# Patient Record
Sex: Female | Born: 1995 | Race: White | Hispanic: No | Marital: Single | Smoking: Never smoker
Health system: Southern US, Community
[De-identification: ages and names within clinical notes are randomized; demographics above are authoritative.]

## PROBLEM LIST (undated history)

## (undated) ENCOUNTER — Emergency Department (HOSPITAL_COMMUNITY): Admission: EM | Payer: Medicaid Other | Source: Home / Self Care

## (undated) DIAGNOSIS — F419 Anxiety disorder, unspecified: Secondary | ICD-10-CM

## (undated) DIAGNOSIS — B009 Herpesviral infection, unspecified: Secondary | ICD-10-CM

## (undated) DIAGNOSIS — C801 Malignant (primary) neoplasm, unspecified: Secondary | ICD-10-CM

## (undated) DIAGNOSIS — F909 Attention-deficit hyperactivity disorder, unspecified type: Secondary | ICD-10-CM

## (undated) DIAGNOSIS — J45909 Unspecified asthma, uncomplicated: Secondary | ICD-10-CM

## (undated) DIAGNOSIS — D649 Anemia, unspecified: Secondary | ICD-10-CM

## (undated) DIAGNOSIS — F32A Depression, unspecified: Secondary | ICD-10-CM

## (undated) DIAGNOSIS — K219 Gastro-esophageal reflux disease without esophagitis: Secondary | ICD-10-CM

## (undated) DIAGNOSIS — Z800 Family history of malignant neoplasm of digestive organs: Secondary | ICD-10-CM

## (undated) DIAGNOSIS — Z808 Family history of malignant neoplasm of other organs or systems: Secondary | ICD-10-CM

## (undated) DIAGNOSIS — F329 Major depressive disorder, single episode, unspecified: Secondary | ICD-10-CM

## (undated) HISTORY — DX: Unspecified asthma, uncomplicated: J45.909

## (undated) HISTORY — DX: Major depressive disorder, single episode, unspecified: F32.9

## (undated) HISTORY — DX: Anxiety disorder, unspecified: F41.9

## (undated) HISTORY — DX: Family history of malignant neoplasm of other organs or systems: Z80.8

## (undated) HISTORY — DX: Family history of malignant neoplasm of digestive organs: Z80.0

## (undated) HISTORY — DX: Depression, unspecified: F32.A

## (undated) HISTORY — PX: NO PAST SURGERIES: SHX2092

## (undated) MED FILL — Dexamethasone Sodium Phosphate Inj 100 MG/10ML: INTRAMUSCULAR | Qty: 1 | Status: AC

---
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
## 2014-01-08 ENCOUNTER — Encounter (HOSPITAL_BASED_OUTPATIENT_CLINIC_OR_DEPARTMENT_OTHER): Payer: Self-pay

## 2014-01-08 ENCOUNTER — Emergency Department (HOSPITAL_BASED_OUTPATIENT_CLINIC_OR_DEPARTMENT_OTHER)
Admission: EM | Admit: 2014-01-08 | Discharge: 2014-01-08 | Disposition: A | Discharge status: Home / Self Care | Payer: Self-pay | Attending: Emergency Medicine | Admitting: Emergency Medicine

## 2014-01-08 DIAGNOSIS — Z720 Tobacco use: Secondary | ICD-10-CM | POA: Insufficient documentation

## 2014-01-08 DIAGNOSIS — N390 Urinary tract infection, site not specified: Secondary | ICD-10-CM | POA: Insufficient documentation

## 2014-01-08 DIAGNOSIS — F329 Major depressive disorder, single episode, unspecified: Secondary | ICD-10-CM | POA: Insufficient documentation

## 2014-01-08 DIAGNOSIS — Z3202 Encounter for pregnancy test, result negative: Secondary | ICD-10-CM | POA: Insufficient documentation

## 2014-01-08 DIAGNOSIS — F419 Anxiety disorder, unspecified: Secondary | ICD-10-CM | POA: Insufficient documentation

## 2014-01-08 DIAGNOSIS — Z7952 Long term (current) use of systemic steroids: Secondary | ICD-10-CM | POA: Insufficient documentation

## 2014-01-08 DIAGNOSIS — J45909 Unspecified asthma, uncomplicated: Secondary | ICD-10-CM | POA: Insufficient documentation

## 2014-01-08 DIAGNOSIS — Z79899 Other long term (current) drug therapy: Secondary | ICD-10-CM | POA: Insufficient documentation

## 2014-01-08 LAB — URINALYSIS, ROUTINE W REFLEX MICROSCOPIC
Glucose, UA: NEGATIVE mg/dL
Ketones, ur: 15 mg/dL — AB
Nitrite: NEGATIVE
PROTEIN: 100 mg/dL — AB
Specific Gravity, Urine: 1.018 (ref 1.005–1.030)
UROBILINOGEN UA: 0.2 mg/dL (ref 0.0–1.0)
pH: 6.5 (ref 5.0–8.0)

## 2014-01-08 LAB — URINE MICROSCOPIC-ADD ON

## 2014-01-08 LAB — PREGNANCY, URINE: Preg Test, Ur: NEGATIVE

## 2014-01-08 MED ORDER — ONDANSETRON HCL 4 MG PO TABS: 4.0000 mg | ORAL_TABLET | Freq: Four times a day (QID) | ORAL | Status: DC

## 2014-01-08 MED ORDER — CEPHALEXIN 500 MG PO CAPS: ORAL_CAPSULE | ORAL | Status: DC

## 2014-01-08 NOTE — Discharge Instructions (Signed)

## 2014-01-08 NOTE — ED Notes (Addendum)
Pt reports lower back and lower abdominal pain for 2 days - states recently self medicated for UTI with 4 day course of OTC urinary pain relief medication. Also c/o nausea and vomiting x2 episodes. Pt reports taking a friends tramadol at 1500.

## 2014-01-08 NOTE — ED Provider Notes (Signed)
CSN: [id number]     Arrival date & time 01/08/14  1747 History   First MD Initiated Contact with Patient 01/08/14 1838     Chief Complaint  Patient presents with  . Abdominal Pain     (Consider location/radiation/quality/duration/timing/severity/associated sxs/prior Treatment) HPI   18 year old female with anxiety and depression who presents complaining of low abdominal pain. She states for the past 4 days she has been having dysuria, which include burning urination, urinary frequency and urgency. Symptoms felt similar to prior UTI that she had in the past. She was taking over-the-counter UTI medication with some relief. However for the past 2 days she has had achy pain throughout her low back and her left abdomen. Pain has gotten progressively worse. She reported taking one of her friends tramadol which helps. She did felt nauseous and vomited twice. Reports having some chills. She denies any headache, chest pain, short of breath, productive cough, vaginal bleeding, vaginal discharge, or rash. No prior history of kidney stone. She is sexually active with one partner, using protection she denies any prior history of STD.  Past Medical History  Diagnosis Date  . Anxiety   . Depression   . Asthma     Exercise Induced   History reviewed. No pertinent past surgical history. Family History  Problem Relation Age of Onset  . Depression Mother   . Hyperlipidemia Mother   . Cancer Mother     thyroid  . ADD / ADHD Brother   . Diabetes Maternal Grandfather   . Atrial fibrillation Father   . Irritable bowel syndrome Father     also brother  . Depression Brother    History  Substance Use Topics  . Smoking status: Light Tobacco Smoker  . Smokeless tobacco: Not on file  . Alcohol Use: Yes     Comment: rarely   OB History    No data available     Review of Systems  All other systems reviewed and are negative.     Allergies  Zoloft  Home Medications   Prior to Admission  medications   Medication Sig Start Date End Date Taking? Authorizing Provider  albuterol (PROVENTIL HFA;VENTOLIN HFA) 108 (90 BASE) MCG/ACT inhaler Inhale 2 puffs into the lungs every 6 (six) hours as needed.    Historical Provider, MD  escitalopram (LEXAPRO) 20 MG tablet Take 0.5 tablets (10 mg total) by mouth daily. 05/03/12   Wardell Honour, MD  medroxyPROGESTERone (DEPO-PROVERA) 150 MG/ML injection Inject 150 mg into the muscle every 3 (three) months.    Historical Provider, MD  Multiple Vitamin (MULTIVITAMINS PO) Take by mouth daily.    Historical Provider, MD  sertraline (ZOLOFT) 20 MG/ML concentrated solution Take 5 mLs (100 mg total) by mouth daily. 10/31/11   Wardell Honour, MD   BP 123/82 mmHg  Pulse 96  Temp(Src) 98.1 F (36.7 C) (Oral)  Resp 20  Ht 5\' 5"  (1.651 m)  Wt 117 lb (53.071 kg)  BMI 19.47 kg/m2  SpO2 97%  LMP 01/01/2014 Physical Exam  Constitutional: She is oriented to person, place, and time. She appears well-developed and well-nourished. No distress.  HENT:  Head: Atraumatic.  Eyes: Conjunctivae are normal.  Neck: Neck supple.  Cardiovascular: Intact distal pulses.   Abdominal: Soft. There is tenderness (suprapubic tenderness without guarding or rebound tenderness.).  Musculoskeletal: She exhibits no edema.  Paralumbar muscular tenderness to percussion. No significant midline spine tenderness.  Neurological: She is alert and oriented to person, place, and time.  Skin: No rash noted.  Psychiatric: She has a normal mood and affect.  Nursing note and vitals reviewed.   ED Course  Procedures (including critical care time)  7:07 PM Patient here with low back pain and abdominal pain. Her urine shows evidence of urinary tract infection. Given her low abdominal pain I did offer to do a pelvic exam to rule STD however patient declined. Treat her symptoms but patient aware to return if his symptoms persist or worsen. She is able to tolerate by mouth. Her vital signs  has improved. She appeared nontoxic.  Labs Review Labs Reviewed  URINALYSIS, ROUTINE W REFLEX MICROSCOPIC - Abnormal; Notable for the following:    Color, Urine AMBER (*)    APPearance CLOUDY (*)    Hgb urine dipstick LARGE (*)    Bilirubin Urine SMALL (*)    Ketones, ur 15 (*)    Protein, ur 100 (*)    Leukocytes, UA LARGE (*)    All other components within normal limits  URINE MICROSCOPIC-ADD ON - Abnormal; Notable for the following:    Bacteria, UA MANY (*)    All other components within normal limits  PREGNANCY, URINE    Imaging Review No results found.   EKG Interpretation None      MDM   Final diagnoses:  UTI (lower urinary tract infection)    BP 123/82 mmHg  Pulse 96  Temp(Src) 98.1 F (36.7 C) (Oral)  Resp 20  Ht 5\' 5"  (1.651 m)  Wt 117 lb (53.071 kg)  BMI 19.47 kg/m2  SpO2 97%  LMP 01/01/2014     Domenic Moras, PA-C 01/08/14 1911  Fredia Sorrow, MD 01/09/14 2007

## 2017-02-16 DIAGNOSIS — J4520 Mild intermittent asthma, uncomplicated: Secondary | ICD-10-CM | POA: Insufficient documentation

## 2017-02-16 DIAGNOSIS — F332 Major depressive disorder, recurrent severe without psychotic features: Secondary | ICD-10-CM | POA: Insufficient documentation

## 2017-02-16 DIAGNOSIS — F411 Generalized anxiety disorder: Secondary | ICD-10-CM | POA: Insufficient documentation

## 2017-02-16 DIAGNOSIS — F900 Attention-deficit hyperactivity disorder, predominantly inattentive type: Secondary | ICD-10-CM | POA: Insufficient documentation

## 2017-02-16 DIAGNOSIS — F1290 Cannabis use, unspecified, uncomplicated: Secondary | ICD-10-CM | POA: Insufficient documentation

## 2020-08-10 ENCOUNTER — Telehealth: Payer: Self-pay | Admitting: Adult Health

## 2020-08-10 NOTE — Telephone Encounter (Signed)
Received  a call from Ms. Laughery to have workup for breast cancer. She has been cld and scheduled to see Mendel Ryder on 6/27 at 12:30pm. I lft the appt date and time on the pt's vm.

## 2020-08-13 ENCOUNTER — Telehealth: Payer: Self-pay | Admitting: Adult Health

## 2020-08-13 ENCOUNTER — Ambulatory Visit: Payer: Self-pay | Admitting: *Deleted

## 2020-08-13 ENCOUNTER — Encounter: Payer: Self-pay | Admitting: Adult Health

## 2020-08-13 ENCOUNTER — Inpatient Hospital Stay: Payer: Medicaid Other | Attending: Adult Health | Admitting: Adult Health

## 2020-08-13 ENCOUNTER — Other Ambulatory Visit: Payer: Self-pay

## 2020-08-13 VITALS — BP 118/82 | Wt 138.9 lb

## 2020-08-13 VITALS — BP 121/68 | HR 100 | Temp 97.9°F | Resp 18 | Ht 65.0 in | Wt 138.6 lb

## 2020-08-13 DIAGNOSIS — Z7289 Other problems related to lifestyle: Secondary | ICD-10-CM | POA: Insufficient documentation

## 2020-08-13 DIAGNOSIS — N6325 Unspecified lump in the left breast, overlapping quadrants: Secondary | ICD-10-CM | POA: Insufficient documentation

## 2020-08-13 DIAGNOSIS — Z79899 Other long term (current) drug therapy: Secondary | ICD-10-CM | POA: Insufficient documentation

## 2020-08-13 DIAGNOSIS — Z8379 Family history of other diseases of the digestive system: Secondary | ICD-10-CM | POA: Insufficient documentation

## 2020-08-13 DIAGNOSIS — Z833 Family history of diabetes mellitus: Secondary | ICD-10-CM | POA: Diagnosis not present

## 2020-08-13 DIAGNOSIS — N6320 Unspecified lump in the left breast, unspecified quadrant: Secondary | ICD-10-CM

## 2020-08-13 DIAGNOSIS — A6000 Herpesviral infection of urogenital system, unspecified: Secondary | ICD-10-CM

## 2020-08-13 DIAGNOSIS — Z800 Family history of malignant neoplasm of digestive organs: Secondary | ICD-10-CM | POA: Diagnosis not present

## 2020-08-13 DIAGNOSIS — R2232 Localized swelling, mass and lump, left upper limb: Secondary | ICD-10-CM

## 2020-08-13 DIAGNOSIS — Z01419 Encounter for gynecological examination (general) (routine) without abnormal findings: Secondary | ICD-10-CM

## 2020-08-13 DIAGNOSIS — Z8249 Family history of ischemic heart disease and other diseases of the circulatory system: Secondary | ICD-10-CM | POA: Diagnosis not present

## 2020-08-13 DIAGNOSIS — Z975 Presence of (intrauterine) contraceptive device: Secondary | ICD-10-CM | POA: Insufficient documentation

## 2020-08-13 DIAGNOSIS — M545 Low back pain, unspecified: Secondary | ICD-10-CM | POA: Diagnosis not present

## 2020-08-13 DIAGNOSIS — R599 Enlarged lymph nodes, unspecified: Secondary | ICD-10-CM | POA: Diagnosis not present

## 2020-08-13 DIAGNOSIS — Z808 Family history of malignant neoplasm of other organs or systems: Secondary | ICD-10-CM | POA: Insufficient documentation

## 2020-08-13 DIAGNOSIS — Z818 Family history of other mental and behavioral disorders: Secondary | ICD-10-CM | POA: Diagnosis not present

## 2020-08-13 DIAGNOSIS — Z8349 Family history of other endocrine, nutritional and metabolic diseases: Secondary | ICD-10-CM | POA: Diagnosis not present

## 2020-08-13 NOTE — Progress Notes (Signed)
[name] is a 25 y.o. G0P0000 female who presents to [facility] clinic today with complaint of let breast lump since November 2021 that has increased in size over the past two months. Patient stated she started having left breast pain 2 weeks ago that comes and goes. Patient states the pain is sharp at times. Patient rates the pain at a 3 out of 10. Patient complained that she has noticed over the past 1.5 months that her left nipple has been retracting.    Pap Smear: Pap smear completed today. Last Pap smear was 01/09/2016 at [facility] in [place] [facility] and was normal. Per patient has no history of an abnormal Pap smear. Last Pap smear result is available in Epic.   Physical exam: Breasts Breasts symmetrical. No skin abnormalities bilateral breasts. No nipple retraction right breast. Left nipple retracted that per patient has been a change over the past 1.5 months. No nipple discharge bilateral breasts. No lymphadenopathy. Palpated a left breast mass at the central portion of breast measuring 7 cm x 4 cm x 6 cm and a left axilla at 1 o'clock 10 cm from the nipple. No discrete masses palpated right breast. No complaints of pain or tenderness on exam.   Pelvic/Bimanual Ext Genitalia No lesions, no swelling and no discharge observed on external genitalia.        Vagina Vagina pink and normal texture. No lesions and scant amount of white colored discharge observed in vagina. Patient stated she has not noticed any discharge and typically has discharge prior to menstrual cycle that is due tomorrow. Patient denied any itching.         Cervix Cervix is present. Cervix pink and of normal texture. No discharge observed. IUD strings visualized.   Uterus Uterus is present and palpable. Uterus in normal position and normal size.        Adnexae Bilateral ovaries present and palpable. No tenderness on palpation.         Rectovaginal No rectal exam completed today since  patient had no rectal complaints. No skin abnormalities observed on exam.     Smoking History: Patient has tried smoking one time in her teens.    Patient Navigation: Patient education provided. Access to services provided for patient through [facility] program.    Breast and Cervical Cancer Risk Assessment: Patient does not have family history of breast cancer, known genetic mutations, or radiation treatment to the chest before age 86. Patient does not have history of cervical dysplasia, immunocompromised, or DES exposure in-utero. Breast cancer risk assessment completed. No breast cancer risk calculated due to patient is less than 53 years old.  Risk Assessment     Risk Scores       08/13/2020   Last edited by: Royston Bake, CMA   5-year risk:    Lifetime risk:             A: [facility] exam with pap smear Complaint of left breast lump, pain, and new nipple inversion.  P: Referred patient to the [facility] for a left breast biopsy per recommendation. Appointment scheduled Tuesday, August 14, 2020. [facility] will [facility] with appointment.  Loletta Parish, RN 08/13/2020 4:08 PM

## 2020-08-13 NOTE — Assessment & Plan Note (Addendum)
This is very concerning for breast cancer.  Ellen [name] met with myself and Dr. Lindi [name] today.  She met with Ellen [name] in [facility] after her appointment with myself and Dr. Lindi [name].  We will work to get her care expedited so that we can start treatment in 2 weeks.  We will get the following:  1. Biopsy of the left breast: The breast center is awaiting her CD from [place] so they can read it and get her biopsy scheduled.    2. I placed orders for CT chest/abdomen/pelvis, bone scan, and MRI breasts to determine extent of disease  3. She has one maternal great grandfather with pancreatic cancer, otherwise a negative family history.  Due to her age, I placed a referral for genetic testing.    4. We discussed her fertility preservation and I will order Goserelin once we have a cancer diagnosis to associate with the treatment.    5. I placed an order for an echocardiogram and we will work to get her in for chemo class for Wednesday of next week.  Dr. Georgette [name] is working to get her in for an appointment so he can place a port next week.   I sent the above to our navigators, and placed orders.  Ellen [name] will f/u with Dr. Lindi [name] on Friday at noon with labs prior to discuss progress, results, and next steps.

## 2020-08-13 NOTE — Telephone Encounter (Signed)
Sch per 6/27 los, pt aware.

## 2020-08-13 NOTE — Progress Notes (Addendum)
[facility] Follow up:    Pcp, No No address on file   DIAGNOSIS: Awaiting biopsy  SUMMARY OF ONCOLOGIC HISTORY: Patient palpated left breast mass beginning in 12/2019, it was small, and she thought it was related to her cycles.  The mass began to increase in size around April, and then rapidly increasing in size over the past 2-3 weeks.  She has noted two enlarged lymph nodes as well.  She underwent mammogram and ultrasound on 6/23 in [place] that showed a 7cm mass concerning for cancer, bi-rads 5.  See interval history for the rest of the details.    CURRENT THERAPY:awaiting biopsy  INTERVAL HISTORY: Ellen [name] 25 y.o. female returns for evaluation of her left breast mass per the summary of oncologic history.  She notes that she has been doing moderately well, and has been more anxious and nervous about her recent imaging and breast concerns as noted above.  She normally works in [place], however doesn't have insurance currently, and was approved for [employer].  She is meeting with Altha Harm this afternoon.    Right now Ellen [name] is living with her mother who is a NP in [place], [place].  Ellen [name] also notes some lower back pain and says that she pulled her back about a month ago.  She describes it as being in her lower back.  She has no bowel/bladder changes, focal weakness, and notes that tylenol or Advil will typically relieve the pain.     Patient Active Problem List   Diagnosis Date Noted   Left breast mass 08/13/2020   Depression 04/20/2012   Depo-Provera contraceptive status 04/20/2012    is allergic to other.  MEDICAL HISTORY: Past Medical History:  Diagnosis Date   Anxiety    Asthma    Exercise Induced   Depression     SURGICAL HISTORY: History reviewed. No pertinent surgical history.  SOCIAL HISTORY: Social History   Socioeconomic History   Marital status: Single    Spouse name: Not on file   Number of children: Not on file   Years of  education: Not on file   Highest education level: Not on file  Occupational History   Not on file  Tobacco Use   Smoking status: Never   Smokeless tobacco: Never  Vaping Use   Vaping Use: Every day   Substances: THC  Substance and Sexual Activity   Alcohol use: Yes    Alcohol/week: 7.0 standard drinks    Types: 7 Glasses of wine per week   Drug use: Yes    Types: Marijuana   Sexual activity: Yes    Birth control/protection: I.U.D.  Other Topics Concern   Not on file  Social History Narrative   Not on file   Social Determinants of Health   Financial Resource Strain: Not on file  Food Insecurity: No Food Insecurity   Worried About Running Out of Food in the Last Year: Never true   Ran Out of Food in the Last Year: Never true  Transportation Needs: No Transportation Needs   Lack of Transportation (Medical): No   Lack of Transportation (Non-Medical): No  Physical Activity: Not on file  Stress: Not on file  Social Connections: Not on file  Intimate Partner Violence: Not on file    FAMILY HISTORY: Family History  Problem Relation Age of Onset   Depression Mother    Hyperlipidemia Mother    Cancer Mother        thyroid   Hypertension Father  Atrial fibrillation Father    Irritable bowel syndrome Father        also brother   ADD / ADHD Brother    Bipolar disorder Brother    Diabetes Maternal Grandfather    Heart attack Paternal Great-grandfather 34   Pancreatic cancer Maternal Great-grandfather     Review of Systems  Constitutional:  Negative for appetite change, chills, fatigue, fever and unexpected weight change.  HENT:   Negative for hearing loss, lump/mass and trouble swallowing.   Eyes:  Negative for eye problems and icterus.  Respiratory:  Negative for chest tightness, cough and shortness of breath.   Cardiovascular:  Negative for chest pain, leg swelling and palpitations.  Gastrointestinal:  Negative for abdominal distention, abdominal pain,  constipation, diarrhea, nausea and vomiting.  Endocrine: Negative for hot flashes.  Genitourinary:  Negative for difficulty urinating.   Musculoskeletal:  Positive for back pain. Negative for arthralgias.  Skin:  Negative for itching and rash.  Neurological:  Negative for dizziness, extremity weakness, headaches and numbness.  Hematological:  Negative for adenopathy. Does not bruise/bleed easily.  Psychiatric/Behavioral:  Negative for depression. The patient is nervous/anxious.      PHYSICAL EXAMINATION  ECOG PERFORMANCE STATUS: 1 - Symptomatic but completely ambulatory  Vitals:   08/13/20 1155  BP: 121/68  Pulse: 100  Resp: 18  Temp: 97.9 F (36.6 C)  SpO2: 100%    Physical Exam Constitutional:      General: She is not in acute distress.    Appearance: Normal appearance. She is not toxic-appearing.  HENT:     Head: Normocephalic and atraumatic.  Eyes:     General: No scleral icterus. Cardiovascular:     Rate and Rhythm: Normal rate and regular rhythm.     Pulses: Normal pulses.     Heart sounds: Normal heart sounds.  Pulmonary:     Effort: Pulmonary effort is normal.     Breath sounds: Normal breath sounds.     Comments: Left breast with 7cm mass throughout breast, small area of left axillary lymphadenopathy noted, right upper outer breast with an area of thickness noted, no axillary adenopathy noted in the right axilla  Abdominal:     General: Abdomen is flat. Bowel sounds are normal. There is no distension.     Palpations: Abdomen is soft.     Tenderness: There is no abdominal tenderness.  Musculoskeletal:        General: No swelling.     Cervical back: Neck supple.  Lymphadenopathy:     Cervical: No cervical adenopathy.  Skin:    General: Skin is warm and dry.     Findings: No rash.  Neurological:     General: No focal deficit present.     Mental Status: She is alert.  Psychiatric:        Mood and Affect: Mood normal.        Behavior: Behavior normal.      ASSESSMENT and THERAPY PLAN:   Left breast mass This is very concerning for breast cancer.  Ellen [name] met with myself and Dr. Lindi Adie today.  She met with Altha Harm in [facility] after her appointment with myself and Dr. Lindi Adie.  We will work to get her care expedited so that we can start treatment in 2 weeks.  We will get the following:  1. Biopsy of the left breast: The breast center is awaiting her CD from [place] so they can read it and get her biopsy scheduled.    2. I placed  orders for CT chest/abdomen/pelvis, bone scan, and MRI breasts to determine extent of disease  3. She has one maternal great grandfather with pancreatic cancer, otherwise a negative family history.  Due to her age, I placed a referral for genetic testing.    4. We discussed her fertility preservation and I will order Goserelin once we have a cancer diagnosis to associate with the treatment.    5. I placed an order for an echocardiogram and we will work to get her in for chemo class for Wednesday of next week.  Dr. Georgette Dover is working to get her in for an appointment so he can place a port next week.   I sent the above to our navigators, and placed orders.  Lismary will f/u with Dr. Lindi Adie on Friday at noon with labs prior to discuss progress, results, and next steps.    All questions were answered. The patient knows to call the clinic with any problems, questions or concerns. We can certainly see the patient much sooner if necessary.  Wilber Bihari, NP 08/13/20 2:25 PM Medical Oncology and Hematology [facility] [place], [place] [address] Tel. [phone]    Fax. [phone]  *Total Encounter Time as defined by the Centers for Medicare and Medicaid Services includes, in addition to the face-to-face time of a patient visit (documented in the note above) non-face-to-face time: obtaining and reviewing outside history, ordering and reviewing medications, tests or procedures, care  coordination (communications with other health care professionals or caregivers) and documentation in the medical record.  Attending Note  I personally saw and examined Lance Muss. The plan of care was discussed with her. I agree with the physical exam findings and assessment and plan as documented above. I performed the majority of the counseling and assessment and plan regarding this encounter Large left breast mass: With left axillary lymphadenopathy concerning for locally advanced breast cancer. Our plan is to obtain an urgent biopsy which will be happening tomorrow.  Subsequently I would like to meet her on Friday to discuss the pathology report and discuss treatment options. We would like to obtain CT chest abdomen pelvis and bone scan and breast MRI. Port placement, echocardiogram will be requested in anticipation of needing neoadjuvant chemotherapy. In spite of all this she might need a mastectomy and targeted node dissection. Adjuvant treatments will depend on receptors.  Fertility counseling: She understands fully well that chemotherapy may cause her to become infertile.  We will do Zoladex injections to see if that might help preserve her fertility. We have a real urgency to get the ball rolling on her to avoid any delays in treatment planning. Dr. Georgette Dover will be seeing her for evaluation and port placement.  Signed Harriette Ohara, MD

## 2020-08-13 NOTE — Patient Instructions (Signed)
Explained breast self awareness with Lance Muss. Pap smear completed today. Let her know [facility] will cover Pap smears every 3 years unless has a history of abnormal Pap smears. Referred patient to the [facility] for a left breast biopsy per recommendation. Appointment scheduled Tuesday, August 14, 2020. The [facility] Center will call patient with appointment.Patient aware that the Breast Center will call her. Lance Muss verbalized understanding.  Cono Gebhard, Arvil Chaco, RN 4:08 PM

## 2020-08-15 ENCOUNTER — Telehealth: Payer: Self-pay | Admitting: Hematology and Oncology

## 2020-08-15 ENCOUNTER — Other Ambulatory Visit: Payer: Self-pay

## 2020-08-15 ENCOUNTER — Ambulatory Visit (HOSPITAL_COMMUNITY)
Admission: RE | Admit: 2020-08-15 | Discharge: 2020-08-15 | Disposition: A | Discharge status: Home / Self Care | Payer: Medicaid Other | Source: Ambulatory Visit | Attending: Adult Health | Admitting: Adult Health

## 2020-08-15 ENCOUNTER — Telehealth: Payer: Self-pay

## 2020-08-15 ENCOUNTER — Other Ambulatory Visit: Payer: Self-pay | Admitting: Adult Health

## 2020-08-15 DIAGNOSIS — N6320 Unspecified lump in the left breast, unspecified quadrant: Secondary | ICD-10-CM | POA: Diagnosis present

## 2020-08-15 DIAGNOSIS — C7951 Secondary malignant neoplasm of bone: Secondary | ICD-10-CM | POA: Insufficient documentation

## 2020-08-15 DIAGNOSIS — C50912 Malignant neoplasm of unspecified site of left female breast: Secondary | ICD-10-CM | POA: Diagnosis not present

## 2020-08-15 DIAGNOSIS — R911 Solitary pulmonary nodule: Secondary | ICD-10-CM | POA: Diagnosis not present

## 2020-08-15 MED ORDER — TECHNETIUM TC 99M MEDRONATE IV KIT
20.0000 | PACK | Freq: Once | INTRAVENOUS | Status: AC | PRN
  Administered 2020-08-15: 20 via INTRAVENOUS

## 2020-08-15 MED ORDER — IOHEXOL 300 MG/ML  SOLN
100.0000 mL | Freq: Once | INTRAMUSCULAR | Status: AC | PRN
  Administered 2020-08-15: 100 mL via INTRAVENOUS

## 2020-08-15 MED ORDER — SODIUM CHLORIDE (PF) 0.9 % IJ SOLN
INTRAMUSCULAR | Status: AC
  Filled 2020-08-15: qty 50

## 2020-08-15 NOTE — Telephone Encounter (Signed)
Scheduled appt per 6/27 sch msg. Pt aware.  

## 2020-08-15 NOTE — Progress Notes (Signed)
Orders placed for ultrasound guided biopsy to be completed at [facility].    Wilber Bihari, NP

## 2020-08-15 NOTE — Telephone Encounter (Signed)
This nurse received call to report from [facility] Radiology for CT CAP.  Information forwarded to Wilber Bihari, NP.  No further questions or concerns at this time.

## 2020-08-16 ENCOUNTER — Ambulatory Visit: Payer: Self-pay | Admitting: Surgery

## 2020-08-16 ENCOUNTER — Ambulatory Visit (HOSPITAL_COMMUNITY)
Admission: RE | Admit: 2020-08-16 | Discharge: 2020-08-16 | Disposition: A | Discharge status: Home / Self Care | Payer: Medicaid Other | Source: Ambulatory Visit | Attending: Adult Health | Admitting: Adult Health

## 2020-08-16 ENCOUNTER — Telehealth: Payer: Self-pay

## 2020-08-16 ENCOUNTER — Other Ambulatory Visit: Payer: Self-pay | Admitting: Adult Health

## 2020-08-16 DIAGNOSIS — C7951 Secondary malignant neoplasm of bone: Secondary | ICD-10-CM | POA: Diagnosis not present

## 2020-08-16 DIAGNOSIS — N6320 Unspecified lump in the left breast, unspecified quadrant: Secondary | ICD-10-CM | POA: Diagnosis present

## 2020-08-16 DIAGNOSIS — C773 Secondary and unspecified malignant neoplasm of axilla and upper limb lymph nodes: Secondary | ICD-10-CM | POA: Diagnosis not present

## 2020-08-16 DIAGNOSIS — C50812 Malignant neoplasm of overlapping sites of left female breast: Secondary | ICD-10-CM | POA: Insufficient documentation

## 2020-08-16 DIAGNOSIS — C50912 Malignant neoplasm of unspecified site of left female breast: Secondary | ICD-10-CM | POA: Diagnosis not present

## 2020-08-16 DIAGNOSIS — C50919 Malignant neoplasm of unspecified site of unspecified female breast: Secondary | ICD-10-CM | POA: Insufficient documentation

## 2020-08-16 DIAGNOSIS — N6315 Unspecified lump in the right breast, overlapping quadrants: Secondary | ICD-10-CM | POA: Insufficient documentation

## 2020-08-16 MED ORDER — GADOBUTROL 1 MMOL/ML IV SOLN
6.5000 mL | Freq: Once | INTRAVENOUS | Status: AC | PRN
  Administered 2020-08-16: 6.5 mL via INTRAVENOUS

## 2020-08-16 NOTE — Progress Notes (Signed)
Liver mass noted on imaging.  Ultrasound guided biopsy of liver orders placed.  Reviewed with patient.    Wilber Bihari, NP

## 2020-08-16 NOTE — Assessment & Plan Note (Signed)
08/15/2020: CT CAP: Breast mass 3.5 cm, left axillary lymph node, multiple bone metastases, extensive liver metastases, small lung nodules  Counseling: Unfortunately patient has widespread metastatic disease.  Therefore curative treatment is not an option.  I explained to her and her mother that the goals of treatment are palliation and prolongation of life.  This cancer will unfortunately take her life.  Our goal is to prolong her life and keep her comfortable.  Treatment plan: 1.  Biopsy of the breast or metastatic lesion in the liver 2. molecular testing 3.  Treatment plan will be with chemotherapy based upon pathology results.  Return to clinic as soon as we have more results available from pathology.

## 2020-08-16 NOTE — Telephone Encounter (Signed)
Spoke with patient to give pap smear results. Informed patient that pap showed (ASCUS, -HPV). Explained to patient that based on this result her next pap smear will be due in 1 year. Patient voiced understanding.

## 2020-08-16 NOTE — H&P (Signed)
History of Present Illness Ellen [name]. [name] MD; 08/16/2020 5:21 PM) The patient is a 25 year old female who presents with breast cancer. Referred by Dr. Satira Mccallum for stage IV breast cancer  This is a 25 year old female in previously good health who presents with about 6 months of a small palpable mass in her upper inner quadrant of the left breast.  In the last 3 weeks, the mass has become quite large and firm with tenderness behind the nipple.  She denies any nipple discharge.  She has been working in [place] for the last couple of years.  She had mammogram and [place] 08/09/20 that showed a 7 x 6 x 4 cm mass in the left breast with some enlarged axillary lymph nodes and nipple retraction.    Upon further questioning, she reports lower back pain for the last two months.  She also had some pain in her left lower lateral ribs recently after coughing.  She came back to [facility] to her family this past weekend and has undergone an expedited work-up.  She met with Dr. Lindi Adie and Thedore Mins on 6/27.  They are planning neoadjuvant chemotherapy after biopsy is complete.  She is referred for port placement.  She has had staging with CT C/A/P as well as bone scan.  She has evidence of widespread bony metastases in her entire spine, skull, right humerus, right anterior second rib, left lateral 8th rib, and throughout her pelvis.  She also has evidence of multiple liver metastases and possibly in her right lung.  She had biopsies of the mass and the axillary lymph nodes today at [facility].  She is scheduled for MRI tonight.  She is going to follow-up with Dr. Lindi Adie tomorrow.    Past Medical History:   Anxiety  Asthma Exercise Induced  Depression   SURGICAL HISTORY: History reviewed. No pertinent surgical history.  SOCIAL HISTORY: Social History   Socioeconomic History  Marital status: Single  Tobacco Use  Smoking status: Never  Smokeless tobacco: Never Vaping Use  Vaping Use: Every  day  Substances: THC Substance and Sexual Activity  Alcohol use: Yes Alcohol/week: 7.0 standard drinks Types: 7 Glasses of wine per week  Drug use: Yes Types: Marijuana  Sexual activity: Yes Birth control/protection: I.U.D.      FAMILY HISTORY:  Family History Problem Relation Age of Onset  Depression Mother  Hyperlipidemia Mother  Cancer Mother    thyroid  Hypertension Father  Atrial fibrillation Father  Irritable bowel syndrome Father    also brother  ADD / ADHD Brother  Bipolar disorder Brother  Diabetes Maternal Grandfather  Heart attack Paternal Great-grandfather 87  Pancreatic cancer Maternal Great-grandfather   Review of Systems Constitutional:  Negative for appetite change, chills, fatigue, fever and unexpected weight change. HENT:   Negative for hearing loss, lump/mass and trouble swallowing.   Eyes:  Negative for eye problems and icterus. Respiratory:  Negative for chest tightness, cough and shortness of breath.   Cardiovascular:  Negative for chest pain, leg swelling and palpitations. Gastrointestinal:  Negative for abdominal distention, abdominal pain, constipation, diarrhea, nausea and vomiting. Endocrine: Negative for hot flashes. Genitourinary:  Negative for difficulty urinating.   Musculoskeletal:  Positive for back pain. Negative for arthralgias. Skin:  Negative for itching and rash. Neurological:  Negative for dizziness, extremity weakness, headaches and numbness. Hematological:  Negative for adenopathy. Does not bruise/bleed easily. Psychiatric/Behavioral:  Negative for depression. The patient is nervous/anxious.       Problem List/Past  Medical Rodman Key K. 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 Lexapro  (20MG Tablet, Oral) Active. Albuterol  (90MCG/ACT Aerosol Soln, Inhalation) Active. Gabapentin  (100MG Tablet, Oral) Active. Multivitamin  (Oral) Active. Kyleena  (19.5MG IUD, Intrauterine) Active. Medications Reconciled   Pregnancy / Birth History Mammie Lorenzo, LPN; 7/98/9211 9:41 PM) Age at menarche  26 years. Contraceptive History  Intrauterine device. Gravida  0 Para  0 Regular periods   Other Problems Rodman Key K. Loveta Dellis, MD; 08/16/2020 5:15 PM) Anxiety Disorder  Asthma  Breast Cancer  Depression  Heart murmur  Lump In Breast      Review of Systems Claiborne Billings Dockery LPN; 7/40/8144 8:18 PM) General Not Present- Appetite Loss, Chills, Fatigue, Fever, Night Sweats, Weight Gain and Weight Loss. Skin Not Present- Change in Wart/Mole, Dryness, Hives, Jaundice, New Lesions, Non-Healing Wounds, Rash and Ulcer. HEENT Not Present- Earache, Hearing Loss, Hoarseness, Nose Bleed, Oral Ulcers, Ringing in the Ears, Seasonal Allergies, Sinus Pain, Sore Throat, Visual Disturbances, Wears glasses/contact lenses and Yellow Eyes. Respiratory Not Present- Bloody sputum, Chronic Cough, Difficulty Breathing, Snoring and Wheezing. Breast Present- Breast Mass and Breast Pain. Not Present- Nipple Discharge and Skin Changes. Cardiovascular Not Present- Chest Pain, Difficulty Breathing Lying Down, Leg Cramps, Palpitations, Rapid Heart Rate, Shortness of Breath and Swelling of Extremities. Musculoskeletal Present- Back Pain and Joint Stiffness. Not Present- Joint Pain, Muscle Pain, Muscle Weakness and Swelling of Extremities. Neurological Not Present- Decreased Memory, Fainting, Headaches, Numbness, Seizures, Tingling,  Tremor, Trouble walking and Weakness. Psychiatric Present- Anxiety and Depression. Not Present- Bipolar, Change in Sleep Pattern, Fearful and Frequent crying. Endocrine Not Present- Cold Intolerance, Excessive Hunger, Hair Changes, Heat Intolerance, Hot flashes and New Diabetes. Hematology Not Present- Blood Thinners, Easy Bruising, Excessive bleeding, Gland problems, HIV and Persistent Infections.  Vitals Claiborne Billings Dockery LPN; 5/63/1497 0:26 PM) 08/16/2020 3:40 PM Weight: 141.2 lb   Height: 65 in  Body Surface Area: 1.71 m   Body Mass Index: 23.5 kg/m   Pulse: 99 (Regular)    BP: 118/72(Sitting, Right Arm, Standard)        Physical Exam Rodman Key K. Johnanthony Wilden MD; 08/16/2020 5:17 PM)  The physical exam findings are as follows: Note: Constitutional: WDWN in NAD, conversant, no obvious deformities; resting comfortably Eyes: Pupils equal, round; sclera anicteric; moist conjunctiva; no lid lag HENT: Oral mucosa moist; good dentition Neck: No masses palpated, trachea midline; no thyromegaly Lungs: CTA bilaterally; normal respiratory effort Breasts: right breast - some mild fibrocystic changes; no nipple retraction or discharge; no axillary lymph adenopathy left breast - flattened nipple; large firm mass in central/upper breast; no peau d'orange; no nipple discharge; some palpable left axillary lymph nodes CV: Regular rate and rhythm; no murmurs; extremities well-perfused with no edema Abd: +bowel sounds, soft, non-tender, no palpable organomegaly; no palpable hernias Musc: Normal gait; no apparent clubbing or cyanosis in extremities Lymphatic: No palpable cervical or axillary lymphadenopathy Skin: Warm, dry; no sign of jaundice Psychiatric - alert and oriented x 4; calm mood and affect    Assessment &  Plan Rodman Key K. Tsuei MD; 08/16/2020 5:21 PM)  INVASIVE DUCTAL CARCINOMA OF BREAST, STAGE 4, LEFT (C50.912)  Current Plans Schedule for Surgery - Ultrasound-guided port placement.  The  surgical procedure has been discussed with the patient.  Potential risks, benefits, alternative treatments, and expected outcomes have been explained.  All of the patient's questions at this time have been answered.  The likelihood of reaching the patient's treatment goal is good.  The patient understand the proposed surgical procedure and wishes to proceed. Note: I spent about 45 minutes with the patient and her mother discussing her disease and reviewing her staging studies to this point.  Obviously, they are shocked and overwhelmed with the diagnosis, but she seems to be handling this well and is ready to begin treatment as soon as possible.  We will plan ultrasound-guided port placement for next week with chemotherapy scheduled to begin 08/23/20.  We decided to hold off any discussion regarding possible surgical procedures as that would depend on her response to chemotherapy.  Ellen [name]. Georgette Dover, MD, [facility] Surgery  General Surgery   08/16/2020 5:22 PM

## 2020-08-16 NOTE — Progress Notes (Signed)
Patient Care Team: Pcp, No as PCP - General [name], MD as Consulting Physician (Hematology and Oncology) [name], [name], NP as Nurse Practitioner (Hematology and Oncology) [name], MD as Consulting Physician (General Surgery) Mauro Kaufmann, RN as Registered Nurse Rockwell Germany, RN as Registered Nurse  DIAGNOSIS:    ICD-10-CM   1. Metastatic breast cancer ([place])  C50.919       SUMMARY OF ONCOLOGIC HISTORY: Oncology History  Metastatic breast cancer ([place])  08/16/2020 Initial Diagnosis   Metastatic breast cancer ([place])    Imaging   Large mass in the medial left breast measuring 3.5 cm.  Prominent left axillary lymph node cluster of adjacent lymph nodes, irregular nodule right middle lobe 1.1 cm, aggressive lesion right second rib cortical destruction, multiple lucent lesions throughout the thoracic spine including T1 vertebral body and T2, irregular multilobar lesion central left hepatic lobe 4.3 x 4.7 cm.  Several satellite lesions in the left lateral hepatic lobe.  Multiple sclerotic lesions in the bones iliac wings, acetabular, medial iliac bones, sacrum multiple lesions lumbar spine L3-L4 and L5     CHIEF COMPLIANT: Follow-up of left breast carcinoma  INTERVAL HISTORY: Ellen [name] is a 25 y.o. with above-mentioned history of left breast carcinoma. CT CAP on 08/15/20 showed large medial left breast mass consistent primary breast carcinoma, probable left axillary nodal metastasis, several irregular indeterminate nodule in the lung, skeletal metastasis within several ribs and thoracic vertebral bodies, larger lobular hypoenhancing mass in the liver is highly concerning for hepatic metastasis, and extensive skeletal metastasis involving bony pelvis and lumbar spine. Bone scan on 08/15/20 showed extensive bony metastatic disease. She reports to the clinic today for follow-up and to review her scans. Her major complaint is pelvic pain which makes it  uncomfortable for her to sit.  ALLERGIES:  is allergic to other.  MEDICATIONS:  Current Outpatient Medications  Medication Sig Dispense Refill   albuterol (PROVENTIL HFA;VENTOLIN HFA) 108 (90 BASE) MCG/ACT inhaler Inhale 2 puffs into the lungs every 6 (six) hours as needed. (Patient not taking: Reported on 08/13/2020)     amphetamine-dextroamphetamine (ADDERALL XR) 15 MG 24 hr capsule Take 15 mg by mouth daily. Takes 15 mg on work days     escitalopram (LEXAPRO) 20 MG tablet Take 20 mg by mouth daily.     gabapentin (NEURONTIN) 100 MG capsule Take 100 mg by mouth 3 (three) times daily.     levonorgestrel (KYLEENA) 19.5 MG IUD by Intrauterine route.     Multiple Vitamin (MULTIVITAMINS PO) Take by mouth daily.     No current facility-administered medications for this visit.    PHYSICAL EXAMINATION: ECOG PERFORMANCE STATUS: 1 - Symptomatic but completely ambulatory  Vitals:   08/17/20 0907  BP: 129/70  Pulse: 88  Resp: 18  Temp: 98.1 F (36.7 C)  SpO2: 100%   Filed Weights   08/17/20 0907  Weight: 142 lb 6.4 oz (64.6 kg)      LABORATORY DATA:  I have reviewed the data as listed No flowsheet data found.  Lab Results  Component Value Date   WBC 10.4 08/17/2020   HGB 14.0 08/17/2020   HCT 41.6 08/17/2020   MCV 90.6 08/17/2020   PLT 253 08/17/2020   NEUTROABS 6.6 08/17/2020    ASSESSMENT & PLAN:  Metastatic breast cancer ([place]) 08/15/2020: CT CAP: Breast mass 3.5 cm, left axillary lymph node, multiple bone metastases, extensive liver metastases, small lung nodules  Counseling: Unfortunately patient has widespread metastatic disease.  Therefore curative treatment is not an option.  I explained to her and her mother that the goals of treatment are palliation and prolongation of life.  This cancer will unfortunately take her life.  Our goal is to prolong her life and keep her comfortable.  Treatment plan: 1.  Biopsy of the breast: Performed on 08/16/2020, results hopefully  will be available by next Tuesday 2.  Treatment plan will be with chemotherapy based upon pathology results. I went over the different treatment algorithms between estrogen receptor positive disease, HER2 positive disease or triple negative disease. She is likely to undergo port placement next Thursday.  Hopefully we will have the results before then.  If she is estrogen receptor positive we will consider treatment with Zoladex and letrozole and Verzenio. If she is HER2 positive then we will consider Taxotere Herceptin and Perjeta If she is triple negative then we will consider Taxol with pembrolizumab.  3.  Severe pelvic pain making it difficult to sit: I sent a referral to radiation oncology.  Also discussed the role of Xgeva for prevention of fractures related to bone metastases. Return to clinic as soon as we have more results available from pathology.  I will see her next Wednesday to go over the results.    No orders of the defined types were placed in this encounter.  The patient has a good understanding of the overall plan. she agrees with it. she will call with any problems that may develop before the next visit here.  Total time spent: 30 mins including face to face time and time spent for planning, charting and coordination of care  Rulon Eisenmenger, MD, MPH 08/17/2020  I, Thana Ates, am acting as scribe for Dr. [name].  I have reviewed the above documentation for accuracy and completeness, and I agree with the above.

## 2020-08-16 NOTE — H&P (View-Only) (Signed)
History of Present Illness Ellen [name]. Ellen Dunne MD; 08/16/2020 5:21 PM) The patient is a 25 year old female who presents with breast cancer. Referred by Dr. Satira Mccallum for stage IV breast cancer  This is a 25 year old female in previously good health who presents with about 6 months of a small palpable mass in her upper inner quadrant of the left breast.  In the last 3 weeks, the mass has become quite large and firm with tenderness behind the nipple.  She denies any nipple discharge.  She has been working in [place] for the last couple of years.  She had mammogram and [place] 08/09/20 that showed a 7 x 6 x 4 cm mass in the left breast with some enlarged axillary lymph nodes and nipple retraction.    Upon further questioning, she reports lower back pain for the last two months.  She also had some pain in her left lower lateral ribs recently after coughing.  She came back to [facility] to her family this past weekend and has undergone an expedited work-up.  She met with Dr. Lindi Adie and Thedore Mins on 6/27.  They are planning neoadjuvant chemotherapy after biopsy is complete.  She is referred for port placement.  She has had staging with CT C/A/P as well as bone scan.  She has evidence of widespread bony metastases in her entire spine, skull, right humerus, right anterior second rib, left lateral 8th rib, and throughout her pelvis.  She also has evidence of multiple liver metastases and possibly in her right lung.  She had biopsies of the mass and the axillary lymph nodes today at [facility].  She is scheduled for MRI tonight.  She is going to follow-up with Dr. Lindi Adie tomorrow.    Past Medical History:   Anxiety  Asthma Exercise Induced  Depression   SURGICAL HISTORY: History reviewed. No pertinent surgical history.  SOCIAL HISTORY: Social History   Socioeconomic History  Marital status: Single  Tobacco Use  Smoking status: Never  Smokeless tobacco: Never Vaping Use  Vaping Use: Every  day  Substances: THC Substance and Sexual Activity  Alcohol use: Yes Alcohol/week: 7.0 standard drinks Types: 7 Glasses of wine per week  Drug use: Yes Types: Marijuana  Sexual activity: Yes Birth control/protection: I.U.D.      FAMILY HISTORY:  Family History Problem Relation Age of Onset  Depression Mother  Hyperlipidemia Mother  Cancer Mother    thyroid  Hypertension Father  Atrial fibrillation Father  Irritable bowel syndrome Father    also brother  ADD / ADHD Brother  Bipolar disorder Brother  Diabetes Maternal Grandfather  Heart attack Paternal Great-grandfather 70  Pancreatic cancer Maternal Great-grandfather   Review of Systems Constitutional:  Negative for appetite change, chills, fatigue, fever and unexpected weight change. HENT:   Negative for hearing loss, lump/mass and trouble swallowing.   Eyes:  Negative for eye problems and icterus. Respiratory:  Negative for chest tightness, cough and shortness of breath.   Cardiovascular:  Negative for chest pain, leg swelling and palpitations. Gastrointestinal:  Negative for abdominal distention, abdominal pain, constipation, diarrhea, nausea and vomiting. Endocrine: Negative for hot flashes. Genitourinary:  Negative for difficulty urinating.   Musculoskeletal:  Positive for back pain. Negative for arthralgias. Skin:  Negative for itching and rash. Neurological:  Negative for dizziness, extremity weakness, headaches and numbness. Hematological:  Negative for adenopathy. Does not bruise/bleed easily. Psychiatric/Behavioral:  Negative for depression. The patient is nervous/anxious.       Problem List/Past  Medical Ellen Key K. 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 Lexapro  (20MG Tablet, Oral) Active. Albuterol  (90MCG/ACT Aerosol Soln, Inhalation) Active. Gabapentin  (100MG Tablet, Oral) Active. Multivitamin  (Oral) Active. Kyleena  (19.5MG IUD, Intrauterine) Active. Medications Reconciled   Pregnancy / Birth History Ellen Lorenzo, LPN; [birth year] 9:41 PM) Age at menarche  1 years. Contraceptive History  Intrauterine device. Gravida  0 Para  0 Regular periods   Other Problems Ellen Key K. Rokhaya Quinn, MD; 08/16/2020 5:15 PM) Anxiety Disorder  Asthma  Breast Cancer  Depression  Heart murmur  Lump In Breast      Review of Systems Ellen Billings Dockery LPN; 7/40/8144 8:18 PM) General Not Present- Appetite Loss, Chills, Fatigue, Fever, Night Sweats, Weight Gain and Weight Loss. Skin Not Present- Change in Wart/Mole, Dryness, Hives, Jaundice, New Lesions, Non-Healing Wounds, Rash and Ulcer. HEENT Not Present- Earache, Hearing Loss, Hoarseness, Nose Bleed, Oral Ulcers, Ringing in the Ears, Seasonal Allergies, Sinus Pain, Sore Throat, Visual Disturbances, Wears glasses/contact lenses and Yellow Eyes. Respiratory Not Present- Bloody sputum, Chronic Cough, Difficulty Breathing, Snoring and Wheezing. Breast Present- Breast Mass and Breast Pain. Not Present- Nipple Discharge and Skin Changes. Cardiovascular Not Present- Chest Pain, Difficulty Breathing Lying Down, Leg Cramps, Palpitations, Rapid Heart Rate, Shortness of Breath and Swelling of Extremities. Musculoskeletal Present- Back Pain and Joint Stiffness. Not Present- Joint Pain, Muscle Pain, Muscle Weakness and Swelling of Extremities. Neurological Not Present- Decreased Memory, Fainting, Headaches, Numbness, Seizures, Tingling,  Tremor, Trouble walking and Weakness. Psychiatric Present- Anxiety and Depression. Not Present- Bipolar, Change in Sleep Pattern, Fearful and Frequent crying. Endocrine Not Present- Cold Intolerance, Excessive Hunger, Hair Changes, Heat Intolerance, Hot flashes and New Diabetes. Hematology Not Present- Blood Thinners, Easy Bruising, Excessive bleeding, Gland problems, HIV and Persistent Infections.  Vitals Ellen Billings Dockery LPN; 5/63/1497 0:26 PM) 08/16/2020 3:40 PM Weight: 141.2 lb   Height: 65 in  Body Surface Area: 1.71 m   Body Mass Index: 23.5 kg/m   Pulse: 99 (Regular)    BP: 118/72(Sitting, Right Arm, Standard)        Physical Exam Ellen Key K. Colin Ellers MD; 08/16/2020 5:17 PM)  The physical exam findings are as follows: Note: Constitutional: WDWN in NAD, conversant, no obvious deformities; resting comfortably Eyes: Pupils equal, round; sclera anicteric; moist conjunctiva; no lid lag HENT: Oral mucosa moist; good dentition Neck: No masses palpated, trachea midline; no thyromegaly Lungs: CTA bilaterally; normal respiratory effort Breasts: right breast - some mild fibrocystic changes; no nipple retraction or discharge; no axillary lymph adenopathy left breast - flattened nipple; large firm mass in central/upper breast; no peau d'orange; no nipple discharge; some palpable left axillary lymph nodes CV: Regular rate and rhythm; no murmurs; extremities well-perfused with no edema Abd: +bowel sounds, soft, non-tender, no palpable organomegaly; no palpable hernias Musc: Normal gait; no apparent clubbing or cyanosis in extremities Lymphatic: No palpable cervical or axillary lymphadenopathy Skin: Warm, dry; no sign of jaundice Psychiatric - alert and oriented x 4; calm mood and affect    Assessment &  Plan (Ellen [name] K. Amely Voorheis MD; 08/16/2020 5:21 PM)  INVASIVE DUCTAL CARCINOMA OF BREAST, STAGE 4, LEFT (C50.912)  Current Plans Schedule for Surgery - Ultrasound-guided port placement.  The  surgical procedure has been discussed with the patient.  Potential risks, benefits, alternative treatments, and expected outcomes have been explained.  All of the patient's questions at this time have been answered.  The likelihood of reaching the patient's treatment goal is good.  The patient understand the proposed surgical procedure and wishes to proceed. Note: I spent about 45 minutes with the patient and her mother discussing her disease and reviewing her staging studies to this point.  Obviously, they are shocked and overwhelmed with the diagnosis, but she seems to be handling this well and is ready to begin treatment as soon as possible.  We will plan ultrasound-guided port placement for next week with chemotherapy scheduled to begin 08/23/20.  We decided to hold off any discussion regarding possible surgical procedures as that would depend on her response to chemotherapy.  Annamaria Salah K. Manami Tutor, MD, FACS [facility]  General Surgery   08/16/2020 5:22 PM   

## 2020-08-17 ENCOUNTER — Inpatient Hospital Stay: Payer: Medicaid Other

## 2020-08-17 ENCOUNTER — Ambulatory Visit
Admission: RE | Admit: 2020-08-17 | Discharge: 2020-08-17 | Disposition: A | Discharge status: Home / Self Care | Payer: Medicaid Other | Source: Ambulatory Visit | Attending: Radiation Oncology | Admitting: Radiation Oncology

## 2020-08-17 ENCOUNTER — Encounter: Payer: Self-pay | Admitting: General Practice

## 2020-08-17 ENCOUNTER — Inpatient Hospital Stay (HOSPITAL_BASED_OUTPATIENT_CLINIC_OR_DEPARTMENT_OTHER): Payer: Medicaid Other | Admitting: Hematology and Oncology

## 2020-08-17 ENCOUNTER — Inpatient Hospital Stay: Admission: RE | Admit: 2020-08-17 | Payer: Self-pay | Source: Ambulatory Visit

## 2020-08-17 ENCOUNTER — Other Ambulatory Visit: Payer: Self-pay

## 2020-08-17 ENCOUNTER — Other Ambulatory Visit: Payer: Self-pay | Admitting: *Deleted

## 2020-08-17 VITALS — BP 124/83 | HR 75 | Temp 97.5°F | Resp 18 | Ht 65.0 in | Wt 141.6 lb

## 2020-08-17 DIAGNOSIS — Z5111 Encounter for antineoplastic chemotherapy: Secondary | ICD-10-CM | POA: Insufficient documentation

## 2020-08-17 DIAGNOSIS — C787 Secondary malignant neoplasm of liver and intrahepatic bile duct: Secondary | ICD-10-CM | POA: Insufficient documentation

## 2020-08-17 DIAGNOSIS — Z79899 Other long term (current) drug therapy: Secondary | ICD-10-CM | POA: Insufficient documentation

## 2020-08-17 DIAGNOSIS — C50312 Malignant neoplasm of lower-inner quadrant of left female breast: Secondary | ICD-10-CM | POA: Insufficient documentation

## 2020-08-17 DIAGNOSIS — N6320 Unspecified lump in the left breast, unspecified quadrant: Secondary | ICD-10-CM

## 2020-08-17 DIAGNOSIS — Z5112 Encounter for antineoplastic immunotherapy: Secondary | ICD-10-CM | POA: Insufficient documentation

## 2020-08-17 DIAGNOSIS — R918 Other nonspecific abnormal finding of lung field: Secondary | ICD-10-CM | POA: Insufficient documentation

## 2020-08-17 DIAGNOSIS — C7951 Secondary malignant neoplasm of bone: Secondary | ICD-10-CM | POA: Insufficient documentation

## 2020-08-17 DIAGNOSIS — G893 Neoplasm related pain (acute) (chronic): Secondary | ICD-10-CM | POA: Diagnosis not present

## 2020-08-17 DIAGNOSIS — Z808 Family history of malignant neoplasm of other organs or systems: Secondary | ICD-10-CM | POA: Insufficient documentation

## 2020-08-17 DIAGNOSIS — R197 Diarrhea, unspecified: Secondary | ICD-10-CM | POA: Insufficient documentation

## 2020-08-17 DIAGNOSIS — K769 Liver disease, unspecified: Secondary | ICD-10-CM | POA: Insufficient documentation

## 2020-08-17 DIAGNOSIS — C50919 Malignant neoplasm of unspecified site of unspecified female breast: Secondary | ICD-10-CM

## 2020-08-17 DIAGNOSIS — C50912 Malignant neoplasm of unspecified site of left female breast: Secondary | ICD-10-CM | POA: Diagnosis not present

## 2020-08-17 DIAGNOSIS — Z510 Encounter for antineoplastic radiation therapy: Secondary | ICD-10-CM | POA: Diagnosis present

## 2020-08-17 DIAGNOSIS — C773 Secondary and unspecified malignant neoplasm of axilla and upper limb lymph nodes: Secondary | ICD-10-CM | POA: Insufficient documentation

## 2020-08-17 DIAGNOSIS — Z9221 Personal history of antineoplastic chemotherapy: Secondary | ICD-10-CM

## 2020-08-17 HISTORY — PX: BREAST BIOPSY: SHX20

## 2020-08-17 HISTORY — DX: Personal history of antineoplastic chemotherapy: Z92.21

## 2020-08-17 LAB — CBC WITH DIFFERENTIAL (CANCER CENTER ONLY)
Abs Immature Granulocytes: 0.02 10*3/uL (ref 0.00–0.07)
Basophils Absolute: 0 10*3/uL (ref 0.0–0.1)
Basophils Relative: 0 %
Eosinophils Absolute: 0.2 10*3/uL (ref 0.0–0.5)
Eosinophils Relative: 1 %
HCT: 41.6 % (ref 36.0–46.0)
Hemoglobin: 14 g/dL (ref 12.0–15.0)
Immature Granulocytes: 0 %
Lymphocytes Relative: 27 %
Lymphs Abs: 2.8 10*3/uL (ref 0.7–4.0)
MCH: 30.5 pg (ref 26.0–34.0)
MCHC: 33.7 g/dL (ref 30.0–36.0)
MCV: 90.6 fL (ref 80.0–100.0)
Monocytes Absolute: 0.8 10*3/uL (ref 0.1–1.0)
Monocytes Relative: 8 %
Neutro Abs: 6.6 10*3/uL (ref 1.7–7.7)
Neutrophils Relative %: 64 %
Platelet Count: 253 10*3/uL (ref 150–400)
RBC: 4.59 MIL/uL (ref 3.87–5.11)
RDW: 12 % (ref 11.5–15.5)
WBC Count: 10.4 10*3/uL (ref 4.0–10.5)
nRBC: 0 % (ref 0.0–0.2)

## 2020-08-17 LAB — CMP (CANCER CENTER ONLY)
ALT: 17 U/L (ref 0–44)
AST: 30 U/L (ref 15–41)
Albumin: 4.5 g/dL (ref 3.5–5.0)
Alkaline Phosphatase: 98 U/L (ref 38–126)
Anion gap: 7 (ref 5–15)
BUN: 17 mg/dL (ref 6–20)
CO2: 26 mmol/L (ref 22–32)
Calcium: 9.9 mg/dL (ref 8.9–10.3)
Chloride: 105 mmol/L (ref 98–111)
Creatinine: 0.73 mg/dL (ref 0.44–1.00)
GFR, Estimated: >60 mL/min (ref >60)
Glucose, Bld: 109 mg/dL — ABNORMAL HIGH (ref 70–99)
Potassium: 3.9 mmol/L (ref 3.5–5.1)
Sodium: 138 mmol/L (ref 135–145)
Total Bilirubin: 0.7 mg/dL (ref 0.3–1.2)
Total Protein: 7.9 g/dL (ref 6.5–8.1)

## 2020-08-17 MED ORDER — GOSERELIN ACETATE 3.6 MG ~~LOC~~ IMPL
3.6000 mg | DRUG_IMPLANT | Freq: Once | SUBCUTANEOUS | Status: AC
  Administered 2020-08-17: 3.6 mg via SUBCUTANEOUS

## 2020-08-17 MED ORDER — GOSERELIN ACETATE 3.6 MG ~~LOC~~ IMPL
DRUG_IMPLANT | SUBCUTANEOUS | Status: AC
  Filled 2020-08-17: qty 3.6

## 2020-08-17 NOTE — Progress Notes (Signed)
Radiation Oncology         [phone]) [phone] ________________________________  Initial outpatient Consultation  Name: Ellen [name] MRN: [id number]  Date of Service: 08/17/2020 DOB: [birth year]  CC:Pcp, No  [name], MD   REFERRING PHYSICIAN: [name], MD  DIAGNOSIS: 24 year old female with newly diagnosed metastatic carcinoma, suspected breast primary with pathology pending, with painful osseous metastases involving the lumbar spine and right pelvis.    ICD-10-CM   1. Metastatic breast cancer ([place])  C50.919     2. 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 female seen at the request of Dr. Lindi Adie. She initially presented with a history of a palpable left breast mass, first noticed 12/2019 while she was living and working in [place], [place].  She reports that she began to notice the mass enlarging around April 2022 but has noted more rapid enlargement of the mass over the past few weeks as well as 2 enlarged axillary lymph nodes.  She underwent bilateral diagnostic mammogram and bilateral breast ultrasound in [place] on 08/09/20 which demonstrated a 7 cm irregular central left breast mass (Bi-RADS-5), nipple retraction, two enlarged left axillary lymph nodes, and diffuse skin thickening. She saw Dr. Lindi Adie and Thedore Mins, NP for urgent, to discuss on 08/13/20 and underwent staging CT and bone scan on 08/15/20. CT C/A/P showed the known left breast mass and axillary lymph nodes as well as indeterminate lung nodules, diffuse skeletal metastasis within several ribs, the thoracic spine, bony pelvis, and lumbar spine. Additionally, there was an irregular hypoenhancing, multilobular lesion in the central left hepatic lobe measuring 4.3 x 4.7 cm with several satellite lesions in the left lateral hepatic lobe.  There was no evidence of peritoneal or omental metastasis. Bone scan on 08/15/20 also  confirmed extensive bony metastatic disease.  A breast MRI was performed on 08/16/20 and confirmed the patient's primary malignancy on the left, measuring 7.5 x 5.0 x 5.9 cm, involving all 4 quadrants and invading the skin of the nipple areolar complex as well as the underlying pectoralis muscle with diffuse skin thickening on the left, consistent with inflammatory breast cancer.  There were also multiple small satellite lesions surrounding the dominant mass.  Additionally, there were 2 ring-enhancing lesions in the right breast, a 7 mm in the lower outer quadrant and a 5 mm in the lower inner quadrant, both suspicious for malignancy.  There were at least 4 abnormal left axillary lymph nodes and 2 prominent left internal mammary nodes suspicious for metastatic disease.  She met with Dr. Georgette Dover on 08/16/2020 to discuss Port-A-Cath placement in preparation for her planned systemic therapy which will be determined based on final pathology results. She is scheduled for port placement on 08/23/20.  She has been kindly referred today, as an urgent outpatient workin consult, to discuss the potential role for palliative radiotherapy in the management of her significant low back and pelvic pain which is causing difficulty sitting.  Of note, pap smear performed on 08/13/20 showed atypical squamous cells.  PREVIOUS RADIATION THERAPY: No  PAST MEDICAL HISTORY:  Past Medical History:  Diagnosis Date   Anxiety    Asthma    Exercise Induced   Depression       PAST SURGICAL HISTORY:No past surgical history on file.  FAMILY HISTORY:  Family History  Problem Relation Age of Onset   Depression Mother  Hyperlipidemia Mother    Cancer Mother        thyroid   Hypertension Father    Atrial fibrillation Father    Irritable bowel syndrome Father        also brother   ADD / ADHD Brother    Bipolar disorder Brother    Diabetes Maternal Grandfather    Heart attack Paternal Great-grandfather 40   Pancreatic  cancer Maternal Great-grandfather     SOCIAL HISTORY:  Social History   Socioeconomic History   Marital status: Single    Spouse name: Not on file   Number of children: Not on file   Years of education: Not on file   Highest education level: Not on file  Occupational History   Not on file  Tobacco Use   Smoking status: Never   Smokeless tobacco: Never  Vaping Use   Vaping Use: Every day   Substances: THC  Substance and Sexual Activity   Alcohol use: Yes    Alcohol/week: 7.0 standard drinks    Types: 7 Glasses of wine per week   Drug use: Yes    Types: Marijuana   Sexual activity: Yes    Birth control/protection: I.U.D.  Other Topics Concern   Not on file  Social History Narrative   Menarche at age 7, has a cycles every 28 days that lasts about 11 days, 4 days of spotting, two days of heavy flow, and the other days mild to moderate flow.  Received Depo provera shot x 3 years, Nexplanon x 2 years, and currently has kyleena IUD.  No OCPs.  She has never been pregnant.    Social Determinants of Health   Financial Resource Strain: Not on file  Food Insecurity: No Food Insecurity   Worried About Charity fundraiser in the Last Year: Never true   Ran Out of Food in the Last Year: Never true  Transportation Needs: No Transportation Needs   Lack of Transportation (Medical): No   Lack of Transportation (Non-Medical): No  Physical Activity: Not on file  Stress: Not on file  Social Connections: Not on file  Intimate Partner Violence: Not on file    ALLERGIES: Other  MEDICATIONS:  Current Outpatient Medications  Medication Sig Dispense Refill   albuterol (PROVENTIL HFA;VENTOLIN HFA) 108 (90 BASE) MCG/ACT inhaler Inhale 2 puffs into the lungs every 6 (six) hours as needed. (Patient not taking: Reported on 08/13/2020)     amphetamine-dextroamphetamine (ADDERALL XR) 15 MG 24 hr capsule Take 15 mg by mouth daily. Takes 15 mg on work days     escitalopram (LEXAPRO) 20 MG tablet  Take 20 mg by mouth daily.     gabapentin (NEURONTIN) 100 MG capsule Take 100 mg by mouth 3 (three) times daily.     levonorgestrel (KYLEENA) 19.5 MG IUD by Intrauterine route.     Multiple Vitamin (MULTIVITAMINS PO) Take by mouth daily.     No current facility-administered medications for this encounter.    REVIEW OF SYSTEMS:  On review of systems, the patient reports that she is doing well overall. She denies any chest pain, shortness of breath, cough, fevers, chills, night sweats, or unintended weight changes. She denies any bowel or bladder disturbances, and denies abdominal pain, nausea or vomiting. She reports lower back/sacral pain, that radiates laterally into the posterior right pelvis, which she rates 6-8/10 when she moves/adjusts position.  The pain is described as a constant, dull ache at rest but becomes more pronounced with activity, particularly weightbearing  activity.  She has been taking Tylenol and Advil for pain relief but this is no longer providing good pain control.  She denies any other new focal sites of pain.  She denies any paresthesias or focal weakness in the lower extremities.  A complete review of systems is obtained and is otherwise negative.  PHYSICAL EXAM:  Wt Readings from Last 3 Encounters:  08/17/20 141 lb 9.6 oz (64.2 kg)  08/17/20 142 lb 6.4 oz (64.6 kg)  08/13/20 138 lb 14.4 oz (63 kg)   Temp Readings from Last 3 Encounters:  08/17/20 (!) 97.5 F (36.4 C)  08/17/20 98.1 F (36.7 C) (Temporal)  08/13/20 97.9 F (36.6 C) (Tympanic)   BP Readings from Last 3 Encounters:  08/17/20 124/83  08/17/20 129/70  08/13/20 118/82   Pulse Readings from Last 3 Encounters:  08/17/20 75  08/17/20 88  08/13/20 100    /10  In general this is a well appearing Caucasian female in no acute distress. She's alert and oriented x4 and appropriate throughout the examination. Cardiopulmonary assessment is negative for acute distress and she exhibits normal effort.     KPS = 90  100 - Normal; no complaints; no evidence of disease. 90   - Able to carry on normal activity; minor signs or symptoms of disease. 80   - Normal activity with effort; some signs or symptoms of disease. 12   - Cares for self; unable to carry on normal activity or to do active work. 60   - Requires occasional assistance, but is able to care for most of his personal needs. 50   - Requires considerable assistance and frequent medical care. 57   - Disabled; requires special care and assistance. 51   - Severely disabled; hospital admission is indicated although death not imminent. 73   - Very sick; hospital admission necessary; active supportive treatment necessary. 10   - Moribund; fatal processes progressing rapidly. 0     - Dead  Karnofsky DA, Abelmann [place], Craver LS and Burchenal [facility] [phone]) The use of the nitrogen mustards in the palliative treatment of carcinoma: with particular reference to bronchogenic carcinoma Cancer 1 634-56  LABORATORY DATA:  Lab Results  Component Value Date   WBC 10.4 08/17/2020   HGB 14.0 08/17/2020   HCT 41.6 08/17/2020   MCV 90.6 08/17/2020   PLT 253 08/17/2020   Lab Results  Component Value Date   NA 138 08/17/2020   K 3.9 08/17/2020   CL 105 08/17/2020   CO2 26 08/17/2020   Lab Results  Component Value Date   ALT 17 08/17/2020   AST 30 08/17/2020   ALKPHOS 98 08/17/2020   BILITOT 0.7 08/17/2020     RADIOGRAPHY: NM Bone Scan Whole Body  Result Date: 08/16/2020 CLINICAL DATA:  Breast cancer staging. EXAM: NUCLEAR MEDICINE WHOLE BODY BONE SCAN TECHNIQUE: Whole body anterior and posterior images were obtained approximately 3 hours after intravenous injection of radiopharmaceutical. RADIOPHARMACEUTICALS:  22.0 mCi Technetium-41mMDP IV COMPARISON:  CT 08/12/2020. FINDINGS: Bilateral renal function and excretion. Multiple focal areas of increased activity are noted. Increased activity noted over the left skull, proximal right humerus,  right anterior second rib, left anterolateral eighth rib, throughout the cervical/thoracic/lumbar spine, over both iliac wings and both acetabuli, and left femoral neck. Findings consistent with extensive bony metastatic disease. IMPRESSION: Findings consistent with extensive bony metastatic disease. Electronically Signed   By: TMarcello Moores Register   On: 08/16/2020 09:53   CT CHEST ABDOMEN PELVIS W  CONTRAST  Result Date: 08/15/2020 CLINICAL DATA:  New diagnosis LEFT breast cancer. EXAM: CT CHEST, ABDOMEN, AND PELVIS WITH CONTRAST TECHNIQUE: Multidetector CT imaging of the chest, abdomen and pelvis was performed following the standard protocol during bolus administration of intravenous contrast. CONTRAST:  133mL OMNIPAQUE IOHEXOL 300 MG/ML  SOLN COMPARISON:  Bone scan same day 08/15/2020 FINDINGS: CT CHEST FINDINGS Cardiovascular: No significant vascular findings. Normal heart size. No pericardial effusion. Mediastinum/Nodes: Large mass in the medial LEFT breast measuring 3.5 x 2.7 cm. Prominent LEFT axillary node measures 7 mm short axis with a cluster of adjacent LEFT axillary nodes. No internal mammary adenopathy.  No mediastinal hilar adenopathy. Lungs/Pleura: Irregular nodule in the RIGHT middle lobe measures 11 mm (image 83/series 4). Similar irregular nodule in the LEFT upper lobe measures 7 mm on image 48. Nodular thickening along the RIGHT middle lobe fissure (image 60/4. Musculoskeletal: Aggressive lesion in the anteromedial RIGHT second rib with cortical destruction (image 48/4). Periosteal reaction at a LEFT lower lateral rib on image 153/4. Multiple lucent lesions within the thoracic spine including the T1 vertebral body and T2 vertebral body (image 12 and image 19 of series 4). CT ABDOMEN AND PELVIS FINDINGS Hepatobiliary: Irregular hypoenhancing multilobular lesion in the central LEFT hepatic lobe measures 4.3 by 4.7 cm (image 50/series 2). There several satellite lesions in the LEFT lateral hepatic  lobe. Pancreas: Pancreas is normal. No ductal dilatation. No pancreatic inflammation. Spleen: Normal spleen Adrenals/urinary tract: Adrenal glands and kidneys are normal. The ureters and bladder normal. Stomach/Bowel: Stomach, small bowel, appendix, and cecum are normal. The colon and rectosigmoid colon are normal. Vascular/Lymphatic: Abdominal aorta is normal caliber. There is no retroperitoneal or periportal lymphadenopathy. No pelvic lymphadenopathy. Reproductive: IUD in expected location. Uterus and adnexa unremarkable. Other: no peritoneal metastasis or omental metastasis. Musculoskeletal: Multiple sclerotic lytic lesions in the bones of the iliac wings, acetabuli, medial iliac bones and sacrum. For example lytic lesion in the posterior aspect of the LEFT iliac bone with cortical destruction measures 3.2 cm on image 95/2. Sclerotic lesion in the RIGHT posterior iliac bone measures 2.7 cm. Multiple lytic lesions in the lumbar spine most dramatic at L3-L4 and L5. IMPRESSION: Chest Impression: 1. Large medial LEFT breast mass consistent primary breast carcinoma. 2. Probable LEFT axillary nodal metastasis. 3. Several irregular nodules in the lung are indeterminate. 4. Skeletal metastasis within several ribs and thoracic vertebral bodies. Abdomen / Pelvis Impression: 1. Larger lobular hypoenhancing mass in the liver is highly concerning for hepatic metastasis. 2. No evidence of peritoneal or omental metastasis. 3. Extensive skeletal metastasis involving the bony pelvis and lumbar spine. These results will be called to the ordering clinician or representative by the Radiologist Assistant, and communication documented in the PACS or [employer]. Electronically Signed   By: Suzy Bouchard M.D.   On: 08/15/2020 13:49   MR BREAST BILATERAL W WO CONTRAST INC CAD  Result Date: 08/17/2020 CLINICAL DATA:  Recently diagnosed left breast cancer.  Staging. LABS:  None EXAM: BILATERAL BREAST MRI WITH AND WITHOUT  CONTRAST TECHNIQUE: Multiplanar, multisequence MR images of both breasts were obtained prior to and following the intravenous administration of 6.5 ml of Gadavist Three-dimensional MR images were rendered by post-processing of the original MR data on an independent workstation. The three-dimensional MR images were interpreted, and findings are reported in the following complete MRI report for this study. Three dimensional images were evaluated at the independent interpreting workstation using the DynaCAD thin client. COMPARISON:  Mammography and ultrasound August 09, 2020. CT scan of the chest, abdomen, and pelvis August 15, 2020. Bone scan August 15, 2020. FINDINGS: Breast composition: c. Heterogeneous fibroglandular tissue. Background parenchymal enhancement: Moderate. Right breast: There is a ring-enhancing mass in the lower outer right breast seen on series 10, image 90 measuring 7 mm. In retrospect, a small mass is seen in this region on the comparison CT scan. The periphery of this mass is T2 bright but the central portion is not. This mass demonstrates primarily plateau kinetics with a small amount of washout inferiorly. There is another ring-enhancing mass in the lower inner right breast on series 10, image 82 with primarily plateau kinetics. This mass measures 5 mm. No other suspicious masses are seen in the right breast. Left breast: The patient's known malignancy is centered in the central left breast and involves all 4 quadrants. The mass involves the skin of the nipple areolar complex as seen on series 9, image 59. The mass appears to involve the underlying pectoralis muscle as seen on series 9, image 63. The pectoralis muscle is somewhat tented towards the mass on this image. This mass measures at least 7.5 by 5.0 by 5.9 cm in transverse, AP, and craniocaudal dimensions. There appear to be multiple surrounding satellite lesions. There is skin thickening diffusely seen on the left. Lymph nodes: There are at  least 4 abnormal left axillary lymph nodes. One of the nodes extends posterior to the lateral aspect of the left pectoralis muscle on series 9, image 32. There appear to be at least 2 prominent left internal mammary nodes. There is oval enhancement between the left pectoralis major and minor muscles on series 9, image 41. It is unclear whether this is a lymph node between the 2 muscles or a muscular metastasis. Ancillary findings: The patient's suspected hepatic metastases are not included on this study. The pulmonary nodularity seen on CT imaging is not well evaluated on this study. Numerous bony metastases are identified involving the ribs and sternum. IMPRESSION: 1. The patient's primary malignancy on the left measures 7.5 x 5.0 x 5.9 cm, involving all 4 quadrants. This mass invades the skin of the nipple areolar complex as well as the underlying pectoralis muscle. There is diffuse skin thickening on the left consistent with inflammatory breast cancer. There also appear to be multiple small satellite lesions surrounding the dominant mass. 2. Two ring-enhancing lesions are seen in the right breast, 1 in the lower outer quadrant measuring 7 mm and 1 in the lower inner quadrant measuring 5 mm. Both of these masses are suspicious for malignancy. 3. At least 4 abnormal left axillary lymph nodes are identified. Two prominent left internal mammary nodes are identified and suspicious. 4. An oval region of enhancement is noted in the interpectoral space on the left. Whether this is a lymph node or a metastasis to the pectoralis major muscle is unclear. An interpectoral lymph node is statistically more likely but a muscular metastasis is not excluded. 5. Bony metastases involving the sternum and ribs. RECOMMENDATION: Recommend MRI guided biopsy of the 2 ring-enhancing right breast masses. BI-RADS CATEGORY  4: Suspicious. Electronically Signed   By: Dorise Bullion III M.D   On: 08/17/2020 11:51      IMPRESSION/PLAN: 81. 25 y.o. female with newly diagnosed metastatic carcinoma, suspected breast primary with pathology pending, with painful osseous metastases involving the lumbar spine and right pelvis.  Today, we talked to the patient and her mother about the findings and workup thus far. We discussed the  natural history of metastatic breast cancer and general treatment, highlighting the role of palliative radiotherapy in the management of painful osseous metastases. We focused today on using radiation to manage pain. We discussed the available radiation techniques, and focused on the details and logistics of delivery.  The recommendation is to proceed with a 2-week course of daily, palliative radiotherapy targeting the sites of painful metastases in the lumbar spine and right pelvis.  We reviewed the anticipated acute and late sequelae associated with radiation in this setting. The patient was encouraged to ask questions that were answered to her stated satisfaction.  At the end of our conversation, the patient would like to proceed with the recommended 2-week course of daily, palliative radiotherapy targeting the sites of painful metastases in the lumbar spine and right pelvis.  She appears to have a good understanding of her disease and our treatment recommendations which are of palliative intent.  She has freely signed written consent to proceed today in the office and a copy of this document will be placed in her medical record.  We will proceed with CT simulation/treatment planning this afternoon, in anticipation of beginning her daily treatments on 08/21/2020.  We will share our discussion with Dr. Lindi Adie and move forward with treatment planning accordingly.  We personally spent 75 minutes in this encounter including chart review, reviewing radiological studies, meeting face-to-face with the patient, entering orders and completing documentation.    Ellen Johns, PA-C    [name],  MD  [facility] Direct Dial: [phone]  Fax: [phone] [email or web address]  Skype  [email or web address]   This document serves as a record of services personally performed by [name], MD and Freeman Caldron, PA-C. It was created on their behalf by Wilburn Mylar, a trained medical scribe. The creation of this record is based on the scribe's personal observations and the provider's statements to them. This document has been checked and approved by the attending provider.

## 2020-08-17 NOTE — Progress Notes (Signed)
Histology and Location of Primary Cancer:  Metastatic breast cancer  Sites of Visceral and Bony Metastatic Disease:  CT C/A/P w/ Contrast  08/15/2020 IMPRESSION: --Chest Impression: 1. Large medial LEFT breast mass consistent primary breast carcinoma. 2. Probable LEFT axillary nodal metastasis. 3. Several irregular nodules in the lung are indeterminate. 4. Skeletal metastasis within several ribs and thoracic vertebral bodies. --Abdomen / Pelvis Impression: 1. Larger lobular hypoenhancing mass in the liver is highly concerning for hepatic metastasis. 2. No evidence of peritoneal or omental metastasis. 3. Extensive skeletal metastasis involving the bony pelvis and lumbar spine  Location(s) of Symptomatic Metastases:  Pelvic pain which makes it uncomfortable to sit  Past/Anticipated chemotherapy by medical oncology, if any:  Under care of Dr. [name] 08/17/2020 --Counseling: Unfortunately patient has widespread metastatic disease.  Therefore curative treatment is not an option.  I explained to her and her mother that the goals of treatment are palliation and prolongation of life.  This cancer will unfortunately take her life.  Our goal is to prolong her life and keep her comfortable. --Treatment plan: 1.  Biopsy of the breast: Performed on 08/16/2020, results hopefully will be available by next Tuesday 2.  Treatment plan will be with chemotherapy based upon pathology results. --I went over the different treatment algorithms between estrogen receptor positive disease, HER2 positive disease or triple negative disease. --She is likely to undergo port placement next Thursday.  --Hopefully we will have the results before then.  If she is estrogen receptor positive we will consider treatment with Zoladex and letrozole and Verzenio. --If she is HER2 positive then we will consider Taxotere Herceptin and Perjeta --If she is triple negative then we will consider Taxol with pembrolizumab. 3.   Severe pelvic pain making it difficult to sit: I sent a referral to radiation oncology. --Also discussed the role of Xgeva for prevention of fractures related to bone metastases. --Return to clinic as soon as we have more results available from pathology.  I will see her next Wednesday to go over the results.  Pain on a scale of 0-10 is: Describes lower back/sacral pain at a 6 out of 10 when she moves/adjusts position.    Bowel or bladder concerns?: Denies any issues   Ambulatory status? Walker? Wheelchair?: Patient is able to ambulate unassisted, but pain does impact ease of mobility (often difficult to change position/adjust, but once she does she can walk fine)  SAFETY ISSUES: Prior radiation? No Pacemaker/ICD? No Possible current pregnancy? No--LMP 08/17/20 Is the patient on methotrexate? No  Current Complaints / other details:  Patient has received the first 2 Pfizer vaccines

## 2020-08-17 NOTE — Progress Notes (Signed)
  Radiation Oncology         [phone] ________________________________  Name: Ellen [name] MRN: [id number]  Date: 08/17/2020  DOB: 01/31/1996  SIMULATION AND TREATMENT PLANNING NOTE    ICD-10-CM   1. Metastatic breast cancer (HCC)  C50.919       DIAGNOSIS:  25 yo woman with newly diagnosed stage IV breast cancer with painful lumbar and right pelvic bone metastasis  NARRATIVE:  The patient was brought to the [facility].  Identity was confirmed.  All relevant records and images related to the planned course of therapy were reviewed.  The patient freely provided informed written consent to proceed with treatment after reviewing the details related to the planned course of therapy. The consent form was witnessed and verified by the simulation staff.  Then, the patient was set-up in a stable reproducible  supine position for radiation therapy.  CT images were obtained.  Surface markings were placed.  The CT images were loaded into the planning software.  Then the target and avoidance structures were contoured.  Treatment planning then occurred.  The radiation prescription was entered and confirmed.  Then, I designed and supervised the construction of a total of 3 medically necessary complex treatment devices consisting of leg positioner and MLC apertures to cover the treated lumbar/pelvic area.  I have requested : 3D Simulation  I have requested a DVH of the following structures: Rectum, Bladder, femoral heads and target.  PLAN:  The patient will receive 30 Gy in 10 fractions.  ________________________________  Sheral Apley Tammi Klippel, M.D.

## 2020-08-17 NOTE — Progress Notes (Signed)
Per MD request order for US liver biopsy discontinued, no need to proceed with biopsy at this time.

## 2020-08-17 NOTE — Addendum Note (Signed)
Encounter addended by: [name], MD on: 08/17/2020 4:44 PM  Actions taken: Medication List reviewed, Problem List reviewed, Allergies reviewed

## 2020-08-17 NOTE — Progress Notes (Signed)
[place] Spiritual Care Note  Met Leira and her mom Dow Adolph in radiation oncology between appointments per referral from [facility]. They had already heard about Spiritual Care from a dear family friend who is a cancer survivor.  Ireene and her mom report good support from Simranjit's boyfriend Renato Battles, Larisa's wife Shirlean Mylar, and close friends. They plan to tell close family when they know more about scope of diagnosis and treatment.   Brought handmade prayer shawl, packet of [place] support team/programming info, and cosmetic goody bag as tangible signs of support and encouragement. Provided empathic listening, emotional support, normalization of feelings, and introduction to support resources. Encouraged self-care and self-soothing as they continue to process their whirlwind experience so far.  Plan to follow up with Ailene Ravel at the end of next week, either by phone or infusion, depending on schedule.    [place], [place], [facility] Pager [phone] Voicemail [phone]

## 2020-08-17 NOTE — Patient Instructions (Signed)
Goserelin injection What is this medication? GOSERELIN (GOE se rel in) is similar to a hormone found in the body. It lowers the amount of sex hormones that the body makes. Men will have lower testosterone levels and women will have lower estrogen levels while taking this medicine. In men, this medicine is used to treat prostate cancer; the injection is either given once per month or once every 12 weeks. A once per month injection (only) is used to treat women with endometriosis, dysfunctional uterine bleeding, or advanced breast cancer. This medicine may be used for other purposes; ask your health care provider or pharmacist if you have questions. COMMON BRAND NAME(S): Zoladex What should I tell my care team before I take this medication? They need to know if you have any of these conditions: bone problems diabetes heart disease history of irregular heartbeat an unusual or allergic reaction to goserelin, other medicines, foods, dyes, or preservatives pregnant or trying to get pregnant breast-feeding How should I use this medication? This medicine is for injection under the skin. It is given by a health care professional in a hospital or clinic setting. Talk to your pediatrician regarding the use of this medicine in children. Special care may be needed. Overdosage: If you think you have taken too much of this medicine contact a poison control center or emergency room at once. NOTE: This medicine is only for you. Do not share this medicine with others. What if I miss a dose? It is important not to miss your dose. Call your doctor or health care professional if you are unable to keep an appointment. What may interact with this medication? Do not take this medicine with any of the following medications: cisapride dronedarone pimozide thioridazine This medicine may also interact with the following medications: other medicines that prolong the QT interval (an abnormal heart rhythm) This list  may not describe all possible interactions. Give your health care provider a list of all the medicines, herbs, non-prescription drugs, or dietary supplements you use. Also tell them if you smoke, drink alcohol, or use illegal drugs. Some items may interact with your medicine. What should I watch for while using this medication? Visit your doctor or health care provider for regular checks on your progress. Your symptoms may appear to get worse during the first weeks of this therapy. Tell your doctor or healthcare provider if your symptoms do not start to get better or if they get worse after this time. Your bones may get weaker if you take this medicine for a long time. If you smoke or frequently drink alcohol you may increase your risk of bone loss. A family history of osteoporosis, chronic use of drugs for seizures (convulsions), or corticosteroids can also increase your risk of bone loss. Talk to your doctor about how to keep your bones strong. This medicine should stop regular monthly menstruation in women. Tell your doctor if you continue to menstruate. Women should not become pregnant while taking this medicine or for 12 weeks after stopping this medicine. Women should inform their doctor if they wish to become pregnant or think they might be pregnant. There is a potential for serious side effects to an unborn child. Talk to your health care professional or pharmacist for more information. Do not breast-feed an infant while taking this medicine. Men should inform their doctors if they wish to father a child. This medicine may lower sperm counts. Talk to your health care professional or pharmacist for more information. This medicine may   increase blood sugar. Ask your healthcare provider if changes in diet or medicines are needed if you have diabetes. What side effects may I notice from receiving this medication? Side effects that you should report to your doctor or health care professional as soon as  possible: allergic reactions like skin rash, itching or hives, swelling of the face, lips, or tongue bone pain breathing problems changes in vision chest pain feeling faint or lightheaded, falls fever, chills pain, swelling, warmth in the leg pain, tingling, numbness in the hands or feet signs and symptoms of high blood sugar such as being more thirsty or hungry or having to urinate more than normal. You may also feel very tired or have blurry vision signs and symptoms of low blood pressure like dizziness; feeling faint or lightheaded, falls; unusually weak or tired stomach pain swelling of the ankles, feet, hands trouble passing urine or change in the amount of urine unusually high or low blood pressure unusually weak or tired Side effects that usually do not require medical attention (report to your doctor or health care professional if they continue or are bothersome): change in sex drive or performance changes in breast size in both males and females changes in emotions or moods headache hot flashes irritation at site where injected loss of appetite skin problems like acne, dry skin vaginal dryness This list may not describe all possible side effects. Call your doctor for medical advice about side effects. You may report side effects to FDA at 1-800-FDA-1088. Where should I keep my medication? This drug is given in a hospital or clinic and will not be stored at home. NOTE: This sheet is a summary. It may not cover all possible information. If you have questions about this medicine, talk to your doctor, pharmacist, or health care provider.  2022 Elsevier/Gold Standard (2018-05-24 14:05:56)  

## 2020-08-21 ENCOUNTER — Ambulatory Visit
Admission: RE | Admit: 2020-08-21 | Discharge: 2020-08-21 | Disposition: A | Discharge status: Home / Self Care | Payer: Medicaid Other | Source: Ambulatory Visit | Attending: Radiation Oncology | Admitting: Radiation Oncology

## 2020-08-21 ENCOUNTER — Encounter (HOSPITAL_BASED_OUTPATIENT_CLINIC_OR_DEPARTMENT_OTHER): Payer: Self-pay | Admitting: Surgery

## 2020-08-21 ENCOUNTER — Other Ambulatory Visit: Payer: Self-pay

## 2020-08-21 DIAGNOSIS — C50912 Malignant neoplasm of unspecified site of left female breast: Secondary | ICD-10-CM | POA: Diagnosis not present

## 2020-08-21 DIAGNOSIS — C7951 Secondary malignant neoplasm of bone: Secondary | ICD-10-CM | POA: Diagnosis not present

## 2020-08-21 DIAGNOSIS — G893 Neoplasm related pain (acute) (chronic): Secondary | ICD-10-CM | POA: Diagnosis not present

## 2020-08-21 DIAGNOSIS — Z510 Encounter for antineoplastic radiation therapy: Secondary | ICD-10-CM | POA: Diagnosis not present

## 2020-08-21 NOTE — Progress Notes (Signed)
Patient Care Team: Pcp, No as PCP - General [name], MD as Consulting Physician (Hematology and Oncology) [name], [name], NP as Nurse Practitioner (Hematology and Oncology) [name], MD as Consulting Physician (General Surgery) Mauro Kaufmann, RN as Oncology Nurse Navigator Rockwell Germany, RN as Oncology Nurse Navigator  DIAGNOSIS:    ICD-10-CM   1. Metastatic breast cancer ([place])  C50.919       SUMMARY OF ONCOLOGIC HISTORY: Oncology History  Metastatic breast cancer ([place])  08/15/2020 Imaging   Large mass in the medial left breast measuring 3.5 cm.  Prominent left axillary lymph node cluster of adjacent lymph nodes, irregular nodule right middle lobe 1.1 cm, aggressive lesion right second rib cortical destruction, multiple lucent lesions throughout the thoracic spine including T1 vertebral body and T2, irregular multilobar lesion central left hepatic lobe 4.3 x 4.7 cm.  Several satellite lesions in the left lateral hepatic lobe.  Multiple sclerotic lesions in the bones iliac wings, acetabular, medial iliac bones, sacrum multiple lesions lumbar spine L3-L4 and L5   08/17/2020 Initial Diagnosis   Biopsy revealed IDC with DCIS grade 2, ER 30%, PR 20%, HER2 equivocal by IHC, FISH positive, Ki-67 25%, lymph node positive   09/04/2020 -  Chemotherapy    Patient is on Treatment Plan: BREAST DOCETAXEL + TRASTUZUMAB + PERTUZUMAB (THP) Q21D X 8 CYCLES / TRASTUZUMAB + PERTUZUMAB Q21D X 4 CYCLES       Carcinoma of breast metastatic to bone ([place])  08/17/2020 Initial Diagnosis   Carcinoma of breast metastatic to bone ([place])    09/04/2020 -  Chemotherapy    Patient is on Treatment Plan: BREAST DOCETAXEL + TRASTUZUMAB + PERTUZUMAB (THP) Q21D X 8 CYCLES / TRASTUZUMAB + PERTUZUMAB Q21D X 4 CYCLES         CHIEF COMPLIANT: Follow-up of left breast carcinoma  INTERVAL HISTORY: Ellen [name] is a 25 y.o. with above-mentioned history of metastatic left breast cancer. She  presents to the clinic today for follow-up and to discuss further treatment options.  She underwent breast biopsy as well as a lymph node biopsy and is here today to discuss results.  Her breast MRI showed extensive involvement of the entire breast with metastatic breast cancer that is invaded into the pectoralis muscle and multiple enlarged lymph nodes.  She is currently undergoing palliative radiation therapy for pelvic bone metastasis.  ALLERGIES:  is allergic to other.  MEDICATIONS:  Current Outpatient Medications  Medication Sig Dispense Refill   oxyCODONE-acetaminophen (PERCOCET/ROXICET) 5-325 MG tablet Take 1 tablet by mouth every 8 (eight) hours as needed for severe pain. 60 tablet 0   acyclovir (ZOVIRAX) 800 MG tablet Take 800 mg by mouth 5 (five) times daily.     albuterol (PROVENTIL HFA;VENTOLIN HFA) 108 (90 BASE) MCG/ACT inhaler Inhale 2 puffs into the lungs every 6 (six) hours as needed. (Patient not taking: No sig reported)     amphetamine-dextroamphetamine (ADDERALL XR) 15 MG 24 hr capsule Take 15 mg by mouth daily. Takes 15 mg on work days     gabapentin (NEURONTIN) 100 MG capsule Take 100 mg by mouth 3 (three) times daily.     levonorgestrel (KYLEENA) 19.5 MG IUD by Intrauterine route.     lidocaine-prilocaine (EMLA) cream Apply to affected area once 30 g 3   LORazepam (ATIVAN) 0.5 MG tablet Take 1 tablet (0.5 mg total) by mouth at bedtime as needed for sleep. 30 tablet 0   Multiple Vitamin (MULTIVITAMINS PO) Take by mouth daily.  ondansetron (ZOFRAN) 8 MG tablet Take 1 tablet (8 mg total) by mouth 2 (two) times daily as needed for refractory nausea / vomiting. 30 tablet 1   prochlorperazine (COMPAZINE) 10 MG tablet Take 1 tablet (10 mg total) by mouth every 6 (six) hours as needed for nausea or vomiting. 30 tablet 1   No current facility-administered medications for this visit.    PHYSICAL EXAMINATION: ECOG PERFORMANCE STATUS: 1 - Symptomatic but completely  ambulatory  Vitals:   08/22/20 1503  BP: 126/65  Pulse: 91  Resp: 18  Temp: 97.8 F (36.6 C)  SpO2: 99%   Filed Weights   08/22/20 1503  Weight: 145 lb (65.8 kg)     LABORATORY DATA:  I have reviewed the data as listed CMP Latest Ref Rng & Units 08/17/2020  Glucose 70 - 99 mg/dL 109(H)  BUN 6 - 20 mg/dL 17  Creatinine 0.44 - 1.00 mg/dL 0.73  Sodium 135 - 145 mmol/L 138  Potassium 3.5 - 5.1 mmol/L 3.9  Chloride 98 - 111 mmol/L 105  CO2 22 - 32 mmol/L 26  Calcium 8.9 - 10.3 mg/dL 9.9  Total Protein 6.5 - 8.1 g/dL 7.9  Total Bilirubin 0.3 - 1.2 mg/dL 0.7  Alkaline Phos 38 - 126 U/L 98  AST 15 - 41 U/L 30  ALT 0 - 44 U/L 17    Lab Results  Component Value Date   WBC 10.4 08/17/2020   HGB 14.0 08/17/2020   HCT 41.6 08/17/2020   MCV 90.6 08/17/2020   PLT 253 08/17/2020   NEUTROABS 6.6 08/17/2020    ASSESSMENT & PLAN:  Metastatic breast cancer ([place]) 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, ER 30%, PR 20%, HER2 equivocal by IHC, FISH positive ratio 2.8, Ki-67 25%, lymph node positive  Treatment plan: 1.  Palliative chemotherapy with Taxotere Herceptin Perjeta every 3 weeks x6 cycles followed by Herceptin Perjeta maintenance 2. echocardiogram, chemo class Chemo counseling: Discussed risks and benefits of Taxotere including the risk of alopecia, nausea, cytopenias, neuropathy risk as well as diarrhea and cardiotoxicity related side effects of Herceptin and Perjeta.  Current treatment: Palliative radiation to the painful spine and pelvic metastasis. I sent a prescription for Percocets for pain relief. She is going to be placed with Dr. Georgette Dover tomorrow.  Plan to start chemotherapy 09/04/2020    No orders of the defined types were placed in this encounter.  The patient has a good understanding of the overall plan. she agrees with it.  she will call with any problems that may develop before the next visit here.  Total time spent: 30 mins including face to face time and time spent for planning, charting and coordination of care  Rulon Eisenmenger, MD, MPH 08/22/2020  I, Thana Ates, am acting as scribe for Dr. [name].  I have reviewed the above documentation for accuracy and completeness, and I agree with the above.

## 2020-08-22 ENCOUNTER — Inpatient Hospital Stay (HOSPITAL_BASED_OUTPATIENT_CLINIC_OR_DEPARTMENT_OTHER): Payer: Medicaid Other | Admitting: Hematology and Oncology

## 2020-08-22 ENCOUNTER — Other Ambulatory Visit (HOSPITAL_COMMUNITY): Payer: Self-pay

## 2020-08-22 ENCOUNTER — Other Ambulatory Visit: Payer: Self-pay | Admitting: Hematology and Oncology

## 2020-08-22 ENCOUNTER — Encounter: Payer: Self-pay | Admitting: *Deleted

## 2020-08-22 ENCOUNTER — Other Ambulatory Visit: Payer: Self-pay | Admitting: *Deleted

## 2020-08-22 ENCOUNTER — Other Ambulatory Visit: Payer: Self-pay

## 2020-08-22 ENCOUNTER — Encounter (HOSPITAL_BASED_OUTPATIENT_CLINIC_OR_DEPARTMENT_OTHER): Payer: Self-pay | Admitting: Surgery

## 2020-08-22 ENCOUNTER — Encounter: Payer: Self-pay | Admitting: Hematology and Oncology

## 2020-08-22 ENCOUNTER — Ambulatory Visit
Admission: RE | Admit: 2020-08-22 | Discharge: 2020-08-22 | Disposition: A | Discharge status: Home / Self Care | Payer: Medicaid Other | Source: Ambulatory Visit | Attending: Radiation Oncology | Admitting: Radiation Oncology

## 2020-08-22 DIAGNOSIS — C50912 Malignant neoplasm of unspecified site of left female breast: Secondary | ICD-10-CM

## 2020-08-22 DIAGNOSIS — Z510 Encounter for antineoplastic radiation therapy: Secondary | ICD-10-CM | POA: Diagnosis not present

## 2020-08-22 DIAGNOSIS — C50919 Malignant neoplasm of unspecified site of unspecified female breast: Secondary | ICD-10-CM

## 2020-08-22 DIAGNOSIS — C7951 Secondary malignant neoplasm of bone: Secondary | ICD-10-CM | POA: Diagnosis not present

## 2020-08-22 DIAGNOSIS — G893 Neoplasm related pain (acute) (chronic): Secondary | ICD-10-CM | POA: Diagnosis not present

## 2020-08-22 MED ORDER — OXYCODONE-ACETAMINOPHEN 5-325 MG PO TABS
1.0000 | ORAL_TABLET | Freq: Three times a day (TID) | ORAL | 0 refills | Status: DC | PRN
  Filled 2020-08-22: qty 60, 20d supply, fill #0

## 2020-08-22 MED ORDER — ONDANSETRON HCL 8 MG PO TABS
8.0000 mg | ORAL_TABLET | Freq: Two times a day (BID) | ORAL | 1 refills | Status: DC | PRN
  Filled 2020-08-22: qty 30, 15d supply, fill #0

## 2020-08-22 MED ORDER — LORAZEPAM 0.5 MG PO TABS
0.5000 mg | ORAL_TABLET | Freq: Every evening | ORAL | 0 refills | Status: DC | PRN
  Filled 2020-08-22: qty 30, 30d supply, fill #0

## 2020-08-22 MED ORDER — PROCHLORPERAZINE MALEATE 10 MG PO TABS
10.0000 mg | ORAL_TABLET | Freq: Four times a day (QID) | ORAL | 1 refills | Status: DC | PRN
  Filled 2020-08-22: qty 30, 7d supply, fill #0

## 2020-08-22 MED ORDER — LIDOCAINE-PRILOCAINE 2.5-2.5 % EX CREA
TOPICAL_CREAM | CUTANEOUS | 3 refills | Status: DC
  Filled 2020-08-22: qty 30, 30d supply, fill #0
  Filled 2021-06-10: qty 30, 1d supply, fill #1

## 2020-08-22 NOTE — Assessment & Plan Note (Addendum)
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, ER 30%, PR 20%, HER2 equivocal by IHC, FISH positive ratio 2.8, Ki-67 25%, lymph node positive  Treatment plan: 1.  Palliative chemotherapy with Taxotere Herceptin Perjeta every 3 weeks x6 cycles followed by Herceptin Perjeta maintenance 2. echocardiogram, chemo class  Current treatment: Palliative radiation to the painful spine and pelvic metastasis.  Plan to start chemotherapy 09/04/2020

## 2020-08-22 NOTE — Progress Notes (Signed)
START ON PATHWAY REGIMEN - Breast     Cycle 1: A cycle is 21 days:     Pertuzumab      Trastuzumab-xxxx      Docetaxel    Cycles 2 through 8: A cycle is every 21 days:     Pertuzumab      Trastuzumab-xxxx      Docetaxel    Cycles 9 and beyond: A cycle is every 21 days:     Pertuzumab      Trastuzumab-xxxx   **Always confirm dose/schedule in your pharmacy ordering system**  Patient Characteristics: Distant Metastases or Locoregional Recurrent Disease - Unresected or Locally Advanced Unresectable Disease Progressing after Neoadjuvant and Local Therapies, HER2 Positive, ER Positive, Chemotherapy + HER2-Targeted Therapy, First Line Therapeutic Status: Distant Metastases ER Status: Positive (+) HER2 Status: Positive (+) PR Status: Positive (+) Line of Therapy: First Line Intent of Therapy: Non-Curative / Palliative Intent, Discussed with Patient

## 2020-08-22 NOTE — Anesthesia Preprocedure Evaluation (Addendum)
Anesthesia Evaluation  Patient identified by MRN, date of birth, ID band Patient awake    Reviewed: Allergy & Precautions, NPO status , Patient's Chart, lab work & pertinent test results  Airway Mallampati: I  TM Distance: >3 FB Neck ROM: Full    Dental no notable dental hx. (+) Teeth Intact, Dental Advisory Given   Pulmonary asthma ,  Exercise induced   Pulmonary exam normal breath sounds clear to auscultation       Cardiovascular negative cardio ROS Normal cardiovascular exam Rhythm:Regular Rate:Normal     Neuro/Psych PSYCHIATRIC DISORDERS Anxiety Depression negative neurological ROS     GI/Hepatic negative GI ROS, (+)     substance abuse  marijuana use,   Endo/Other  Right breast Ca with metastasis to bone  Renal/GU negative Renal ROS  negative genitourinary   Musculoskeletal negative musculoskeletal ROS (+)   Abdominal   Peds  Hematology negative hematology ROS (+)   Anesthesia Other Findings   Reproductive/Obstetrics                            Anesthesia Physical Anesthesia Plan  ASA: 2  Anesthesia Plan: General   Post-op Pain Management:    Induction: Intravenous  PONV Risk Score and Plan: 4 or greater and Treatment may vary due to age or medical condition, Midazolam and Ondansetron  Airway Management Planned: LMA  Additional Equipment:   Intra-op Plan:   Post-operative Plan: Extubation in OR  Informed Consent: I have reviewed the patients History and Physical, chart, labs and discussed the procedure including the risks, benefits and alternatives for the proposed anesthesia with the patient or authorized representative who has indicated his/her understanding and acceptance.     Dental advisory given  Plan Discussed with: CRNA and Anesthesiologist  Anesthesia Plan Comments:        Anesthesia Quick Evaluation

## 2020-08-23 ENCOUNTER — Encounter (HOSPITAL_BASED_OUTPATIENT_CLINIC_OR_DEPARTMENT_OTHER): Payer: Self-pay | Admitting: Surgery

## 2020-08-23 ENCOUNTER — Ambulatory Visit
Admission: RE | Admit: 2020-08-23 | Discharge: 2020-08-23 | Disposition: A | Discharge status: Home / Self Care | Payer: Medicaid Other | Source: Ambulatory Visit | Attending: Radiation Oncology | Admitting: Radiation Oncology

## 2020-08-23 ENCOUNTER — Other Ambulatory Visit: Payer: Self-pay

## 2020-08-23 ENCOUNTER — Ambulatory Visit (HOSPITAL_COMMUNITY): Payer: Medicaid Other

## 2020-08-23 ENCOUNTER — Encounter (HOSPITAL_BASED_OUTPATIENT_CLINIC_OR_DEPARTMENT_OTHER)
Admission: RE | Disposition: A | Discharge status: Home / Self Care | Payer: Self-pay | Source: Home / Self Care | Attending: Surgery

## 2020-08-23 ENCOUNTER — Ambulatory Visit (HOSPITAL_BASED_OUTPATIENT_CLINIC_OR_DEPARTMENT_OTHER)
Admission: RE | Admit: 2020-08-23 | Discharge: 2020-08-23 | Disposition: A | Discharge status: Home / Self Care | Payer: Medicaid Other | Attending: Surgery | Admitting: Surgery

## 2020-08-23 ENCOUNTER — Ambulatory Visit (HOSPITAL_BASED_OUTPATIENT_CLINIC_OR_DEPARTMENT_OTHER): Payer: Medicaid Other | Admitting: Anesthesiology

## 2020-08-23 DIAGNOSIS — F418 Other specified anxiety disorders: Secondary | ICD-10-CM | POA: Diagnosis not present

## 2020-08-23 DIAGNOSIS — Z808 Family history of malignant neoplasm of other organs or systems: Secondary | ICD-10-CM | POA: Diagnosis not present

## 2020-08-23 DIAGNOSIS — Z800 Family history of malignant neoplasm of digestive organs: Secondary | ICD-10-CM | POA: Insufficient documentation

## 2020-08-23 DIAGNOSIS — Z452 Encounter for adjustment and management of vascular access device: Secondary | ICD-10-CM

## 2020-08-23 DIAGNOSIS — Z510 Encounter for antineoplastic radiation therapy: Secondary | ICD-10-CM | POA: Diagnosis not present

## 2020-08-23 DIAGNOSIS — C50912 Malignant neoplasm of unspecified site of left female breast: Secondary | ICD-10-CM | POA: Diagnosis not present

## 2020-08-23 DIAGNOSIS — F900 Attention-deficit hyperactivity disorder, predominantly inattentive type: Secondary | ICD-10-CM | POA: Diagnosis not present

## 2020-08-23 DIAGNOSIS — Z419 Encounter for procedure for purposes other than remedying health state, unspecified: Secondary | ICD-10-CM

## 2020-08-23 DIAGNOSIS — G893 Neoplasm related pain (acute) (chronic): Secondary | ICD-10-CM | POA: Diagnosis not present

## 2020-08-23 DIAGNOSIS — C50911 Malignant neoplasm of unspecified site of right female breast: Secondary | ICD-10-CM | POA: Diagnosis not present

## 2020-08-23 DIAGNOSIS — C7951 Secondary malignant neoplasm of bone: Secondary | ICD-10-CM | POA: Diagnosis not present

## 2020-08-23 HISTORY — PX: PORTACATH PLACEMENT: SHX2246

## 2020-08-23 HISTORY — DX: Herpesviral infection, unspecified: B00.9

## 2020-08-23 LAB — POCT PREGNANCY, URINE: Preg Test, Ur: NEGATIVE

## 2020-08-23 SURGERY — INSERTION, TUNNELED CENTRAL VENOUS DEVICE, WITH PORT
Anesthesia: General | Site: Chest | Laterality: Right

## 2020-08-23 MED ORDER — FENTANYL CITRATE (PF) 100 MCG/2ML IJ SOLN: 25.0000 ug | INTRAMUSCULAR | Status: DC | PRN

## 2020-08-23 MED ORDER — BUPIVACAINE-EPINEPHRINE (PF) 0.25% -1:200000 IJ SOLN
INTRAMUSCULAR | Status: AC
  Filled 2020-08-23: qty 30

## 2020-08-23 MED ORDER — HEPARIN SOD (PORK) LOCK FLUSH 100 UNIT/ML IV SOLN
INTRAVENOUS | Status: AC
  Filled 2020-08-23: qty 5

## 2020-08-23 MED ORDER — LIDOCAINE HCL (PF) 2 % IJ SOLN
INTRAMUSCULAR | Status: AC
  Filled 2020-08-23: qty 5

## 2020-08-23 MED ORDER — LIDOCAINE 2% (20 MG/ML) 5 ML SYRINGE
INTRAMUSCULAR | Status: DC | PRN
  Administered 2020-08-23: 60 mg via INTRAVENOUS

## 2020-08-23 MED ORDER — MIDAZOLAM HCL 2 MG/2ML IJ SOLN
INTRAMUSCULAR | Status: AC
  Filled 2020-08-23: qty 2

## 2020-08-23 MED ORDER — DEXAMETHASONE SODIUM PHOSPHATE 4 MG/ML IJ SOLN
INTRAMUSCULAR | Status: DC | PRN
  Administered 2020-08-23: 5 mg via INTRAVENOUS

## 2020-08-23 MED ORDER — CEFAZOLIN SODIUM-DEXTROSE 2-4 GM/100ML-% IV SOLN
2.0000 g | INTRAVENOUS | Status: AC
  Administered 2020-08-23: 2 g via INTRAVENOUS

## 2020-08-23 MED ORDER — PROPOFOL 10 MG/ML IV BOLUS
INTRAVENOUS | Status: DC | PRN
  Administered 2020-08-23: 180 mg via INTRAVENOUS

## 2020-08-23 MED ORDER — ONDANSETRON HCL 4 MG/2ML IJ SOLN: 4.0000 mg | Freq: Once | INTRAMUSCULAR | Status: DC | PRN

## 2020-08-23 MED ORDER — CHLORHEXIDINE GLUCONATE CLOTH 2 % EX PADS: 6.0000 | MEDICATED_PAD | Freq: Once | CUTANEOUS | Status: DC

## 2020-08-23 MED ORDER — ACETAMINOPHEN 500 MG PO TABS
1000.0000 mg | ORAL_TABLET | ORAL | Status: AC
  Administered 2020-08-23: 1000 mg via ORAL

## 2020-08-23 MED ORDER — BUPIVACAINE-EPINEPHRINE 0.25% -1:200000 IJ SOLN
INTRAMUSCULAR | Status: DC | PRN
  Administered 2020-08-23: 10 mL

## 2020-08-23 MED ORDER — PROPOFOL 10 MG/ML IV BOLUS
INTRAVENOUS | Status: AC
  Filled 2020-08-23: qty 20

## 2020-08-23 MED ORDER — OXYCODONE HCL 5 MG PO TABS: 5.0000 mg | ORAL_TABLET | Freq: Once | ORAL | Status: DC | PRN

## 2020-08-23 MED ORDER — MIDAZOLAM HCL 5 MG/5ML IJ SOLN
INTRAMUSCULAR | Status: DC | PRN
  Administered 2020-08-23: 2 mg via INTRAVENOUS

## 2020-08-23 MED ORDER — HEPARIN SOD (PORK) LOCK FLUSH 100 UNIT/ML IV SOLN
INTRAVENOUS | Status: DC | PRN
  Administered 2020-08-23: 500 [IU]

## 2020-08-23 MED ORDER — CEFAZOLIN SODIUM-DEXTROSE 2-4 GM/100ML-% IV SOLN
INTRAVENOUS | Status: AC
  Filled 2020-08-23: qty 100

## 2020-08-23 MED ORDER — SCOPOLAMINE 1 MG/3DAYS TD PT72
MEDICATED_PATCH | TRANSDERMAL | Status: AC
  Filled 2020-08-23: qty 1

## 2020-08-23 MED ORDER — ACETAMINOPHEN 500 MG PO TABS
ORAL_TABLET | ORAL | Status: AC
  Filled 2020-08-23: qty 2

## 2020-08-23 MED ORDER — ONDANSETRON HCL 4 MG/2ML IJ SOLN
INTRAMUSCULAR | Status: AC
  Filled 2020-08-23: qty 2

## 2020-08-23 MED ORDER — FENTANYL CITRATE (PF) 100 MCG/2ML IJ SOLN
INTRAMUSCULAR | Status: AC
  Filled 2020-08-23: qty 2

## 2020-08-23 MED ORDER — BUPIVACAINE HCL (PF) 0.25 % IJ SOLN
INTRAMUSCULAR | Status: AC
  Filled 2020-08-23: qty 90

## 2020-08-23 MED ORDER — SCOPOLAMINE 1 MG/3DAYS TD PT72
1.0000 | MEDICATED_PATCH | TRANSDERMAL | Status: DC
  Administered 2020-08-23: 1.5 mg via TRANSDERMAL

## 2020-08-23 MED ORDER — OXYCODONE HCL 5 MG/5ML PO SOLN
5.0000 mg | Freq: Once | ORAL | Status: DC | PRN
Start: 2020-08-23 — End: 2020-08-23

## 2020-08-23 MED ORDER — ONDANSETRON HCL 4 MG/2ML IJ SOLN
INTRAMUSCULAR | Status: DC | PRN
  Administered 2020-08-23: 4 mg via INTRAVENOUS

## 2020-08-23 MED ORDER — PHENYLEPHRINE HCL (PRESSORS) 10 MG/ML IV SOLN
INTRAVENOUS | Status: DC | PRN
  Administered 2020-08-23: 80 ug via INTRAVENOUS
  Administered 2020-08-23: 40 ug via INTRAVENOUS

## 2020-08-23 MED ORDER — DEXAMETHASONE SODIUM PHOSPHATE 10 MG/ML IJ SOLN
INTRAMUSCULAR | Status: AC
  Filled 2020-08-23: qty 1

## 2020-08-23 MED ORDER — HEPARIN (PORCINE) IN NACL 2-0.9 UNITS/ML
INTRAMUSCULAR | Status: AC | PRN
  Administered 2020-08-23: 500 mL

## 2020-08-23 MED ORDER — HEPARIN (PORCINE) IN NACL 1000-0.9 UT/500ML-% IV SOLN
INTRAVENOUS | Status: AC
  Filled 2020-08-23: qty 500

## 2020-08-23 MED ORDER — LACTATED RINGERS IV SOLN: INTRAVENOUS | Status: DC

## 2020-08-23 MED ORDER — FENTANYL CITRATE (PF) 100 MCG/2ML IJ SOLN
INTRAMUSCULAR | Status: DC | PRN
  Administered 2020-08-23 (×2): 50 ug via INTRAVENOUS

## 2020-08-23 SURGICAL SUPPLY — 47 items
APL PRP STRL LF DISP 70% ISPRP (MISCELLANEOUS) ×1
APL SKNCLS STERI-STRIP NONHPOA (GAUZE/BANDAGES/DRESSINGS) ×1
BAG DECANTER FOR FLEXI CONT (MISCELLANEOUS) ×2 IMPLANT
BENZOIN TINCTURE PRP APPL 2/3 (GAUZE/BANDAGES/DRESSINGS) ×2 IMPLANT
BLADE SURG 11 STRL SS (BLADE) ×2 IMPLANT
BLADE SURG 15 STRL LF DISP TIS (BLADE) ×1 IMPLANT
BLADE SURG 15 STRL SS (BLADE) ×2
CANISTER SUCT 1200ML W/VALVE (MISCELLANEOUS) IMPLANT
CHLORAPREP W/TINT 26 (MISCELLANEOUS) ×2 IMPLANT
CLEANER CAUTERY TIP 5X5 PAD (MISCELLANEOUS) IMPLANT
COVER BACK TABLE 60X90IN (DRAPES) ×2 IMPLANT
COVER MAYO STAND STRL (DRAPES) ×2 IMPLANT
COVER PROBE 5X48 (MISCELLANEOUS) ×2
DECANTER SPIKE VIAL GLASS SM (MISCELLANEOUS) IMPLANT
DRAPE C-ARM 42X72 X-RAY (DRAPES) ×2 IMPLANT
DRAPE LAPAROTOMY TRNSV 102X78 (DRAPES) ×2 IMPLANT
DRAPE UTILITY XL STRL (DRAPES) ×2 IMPLANT
DRSG TEGADERM 4X4.75 (GAUZE/BANDAGES/DRESSINGS) ×2 IMPLANT
ELECT REM PT RETURN 9FT ADLT (ELECTROSURGICAL) ×2
ELECTRODE REM PT RTRN 9FT ADLT (ELECTROSURGICAL) ×1 IMPLANT
GAUZE SPONGE 4X4 12PLY STRL LF (GAUZE/BANDAGES/DRESSINGS) IMPLANT
GLOVE SURG ENC MOIS LTX SZ7 (GLOVE) ×2 IMPLANT
GLOVE SURG UNDER POLY LF SZ7.5 (GLOVE) ×2 IMPLANT
GOWN STRL REUS W/ TWL LRG LVL3 (GOWN DISPOSABLE) ×1 IMPLANT
GOWN STRL REUS W/TWL LRG LVL3 (GOWN DISPOSABLE) ×2
IV KIT MINILOC 20X1 SAFETY (NEEDLE) IMPLANT
KIT CVR 48X5XPRB PLUP LF (MISCELLANEOUS) ×1 IMPLANT
KIT PORT POWER 8FR ISP CVUE (Port) ×2 IMPLANT
NDL SAFETY ECLIPSE 18X1.5 (NEEDLE) IMPLANT
NEEDLE HYPO 18GX1.5 SHARP (NEEDLE)
NEEDLE HYPO 25X1 1.5 SAFETY (NEEDLE) ×2 IMPLANT
NEEDLE SPNL 22GX3.5 QUINCKE BK (NEEDLE) IMPLANT
PACK BASIN DAY SURGERY FS (CUSTOM PROCEDURE TRAY) ×2 IMPLANT
PAD CLEANER CAUTERY TIP 5X5 (MISCELLANEOUS)
PENCIL SMOKE EVACUATOR (MISCELLANEOUS) ×2 IMPLANT
SLEEVE SCD COMPRESS KNEE MED (STOCKING) ×2 IMPLANT
SPONGE GAUZE 2X2 8PLY STRL LF (GAUZE/BANDAGES/DRESSINGS) ×2 IMPLANT
STRIP CLOSURE SKIN 1/2X4 (GAUZE/BANDAGES/DRESSINGS) ×2 IMPLANT
SUT MON AB 4-0 PC3 18 (SUTURE) ×2 IMPLANT
SUT PROLENE 2 0 CT2 30 (SUTURE) ×2 IMPLANT
SUT VIC AB 3-0 SH 27 (SUTURE) ×2
SUT VIC AB 3-0 SH 27X BRD (SUTURE) ×1 IMPLANT
SYR 5ML LUER SLIP (SYRINGE) IMPLANT
SYR CONTROL 10ML LL (SYRINGE) ×2 IMPLANT
TOWEL GREEN STERILE FF (TOWEL DISPOSABLE) ×2 IMPLANT
TUBE CONNECTING 20X1/4 (TUBING) IMPLANT
YANKAUER SUCT BULB TIP NO VENT (SUCTIONS) IMPLANT

## 2020-08-23 NOTE — Anesthesia Postprocedure Evaluation (Signed)
Anesthesia Post Note  Patient: Ellen [name]  Procedure(s) Performed: INSERTION PORT-A-CATH (Right: Chest)     Patient location during evaluation: PACU Anesthesia Type: General Level of consciousness: awake and alert and oriented Pain management: pain level controlled Vital Signs Assessment: post-procedure vital signs reviewed and stable Respiratory status: spontaneous breathing, nonlabored ventilation and respiratory function stable Cardiovascular status: blood pressure returned to baseline and stable Postop Assessment: no apparent nausea or vomiting Anesthetic complications: no   No notable events documented.  Last Vitals:  Vitals:   08/23/20 0837 08/23/20 0838  BP:    Pulse:    Resp:    Temp: 36.7 C 36.7 C  SpO2:      Last Pain:  Vitals:   08/23/20 0900  TempSrc:   PainSc: 3                  Analena Gama A.

## 2020-08-23 NOTE — Op Note (Signed)
Preop diagnosis: Metastatic breast cancer Postop diagnosis: Same Procedure performed: Ultrasound guided right internal jugular vein port placement Surgeon:Nabilah Davoli K Johncarlo Maalouf Anesthesia: General via LMA Indications: This is a 25 year old female who was recently diagnosed with Stage IV left breast cancer.  She presents now for port placement for palliative chemotherapy.  Description of procedure: The patient is brought to the operating room placed in the supine position on the operating table.  After an adequate level of general anesthesia was obtained, the patient right arm was tucked at her side.  Her right chest and neck were prepped with ChloraPrep and draped sterile fashion.  A timeout was taken to ensure the proper patient and proper procedure.  She was placed in Trendelenburg position.  We interrogated her neck with the ultrasound.  The jugular vein is easily identified.  Using ultrasound guidance we directly cannulated the internal jugular vein with good blood return.  Initially, the wire would not pass easily down the right side of the mediastinum.  I removed the wire, and we used ultrasound again to cannulate the internal jugular vein in a slightly different angle.  The wire passed easily.  Fluoroscopy confirmed that the wire headed down the right side of the mediastinum.  The needle was removed.  We created a subcutaneous pocket below the right clavicle.  We first anesthetized with local anesthetic.  We created a subcutaneous tunnel from the subcutaneous pocket to the insertion site on the neck.  An 8 French [place] port was assembled and was tunneled from the subcutaneous pocket to the insertion site.  The catheter was cut to the appropriate length using fluoroscopic guidance.  Using fluoroscopic guidance, we passed the dilator and breakaway sheath over the wire.  The wire and dilator were removed.  The catheter was then advanced through the sheath which was removed.  Fluoroscopy confirmed that there  were no kinks along the length of the catheter.  We are able to aspirate blood easily through the port and were able to flush easily.  The port was secured with two interrupted 2-0 Prolene sutures.  3-0 Vicryl was used to close the subcutaneous tissue and 4-0 Monocryl was used to close the skin at both sites.  Concentrated heparin solution was instilled.  Benzoin and Steri-Strips were applied.  An occlusive dressing was placed.  The patient was then extubated and brought to the recovery room in stable condition.  All sponge, instrument, and needle counts are correct.  Post-op chest x-ray is pending.    Ellen [name]. Georgette Dover, MD, [facility]  General Surgery   08/23/2020 8:38 AM

## 2020-08-23 NOTE — Interval H&P Note (Signed)
History and Physical Interval Note:  08/23/2020 7:19 AM  Ellen [name]  has presented today for surgery, with the diagnosis of BREAST CANCER.  The various methods of treatment have been discussed with the patient and family. After consideration of risks, benefits and other options for treatment, the patient has consented to  Procedure(s): INSERTION PORT-A-CATH (N/A) as a surgical intervention.  The patient's history has been reviewed, patient examined, no change in status, stable for surgery.  I have reviewed the patient's chart and labs.  Questions were answered to the patient's satisfaction.     Maia Petties

## 2020-08-23 NOTE — Discharge Instructions (Addendum)
  PORT-A-CATH: POST OP INSTRUCTIONS  Always review your discharge instruction sheet given to you by the facility where your surgery was performed.   A prescription for pain medication may be given to you upon discharge. Take your pain medication as prescribed, if needed. If narcotic pain medicine is not needed, then you make take acetaminophen (Tylenol) or ibuprofen (Advil) as needed.  Take your usually prescribed medications unless otherwise directed. If you need a refill on your pain medication, please contact our office. All narcotic pain medicine now requires a paper prescription.  Phoned in and fax refills are no longer allowed by law.  Prescriptions will not be filled after 5 pm or on weekends.  You should follow a light diet for the remainder of the day after your procedure. Most patients will experience some mild swelling and/or bruising in the area of the incision. It may take several days to resolve. It is common to experience some constipation if taking pain medication after surgery. Increasing fluid intake and taking a stool softener (such as Colace) will usually help or prevent this problem from occurring. A mild laxative (Milk of Magnesia or Miralax) should be taken according to package directions if there are no bowel movements after 48 hours.  Unless discharge instructions indicate otherwise, you may remove your bandages 48 hours after surgery, and you may shower at that time. You may have steri-strips (small white skin tapes) in place directly over the incision.  These strips should be left on the skin for 7-10 days.  If your surgeon used Dermabond (skin glue) on the incision, you may shower in 24 hours.  The glue will flake off over the next 2-3 weeks.  If your port is left accessed at the end of surgery (needle left in port), the dressing cannot get wet and should only by changed by a healthcare professional. When the port is no longer accessed (when the needle has been removed),  follow step 7.   ACTIVITIES:  Limit activity involving your arms for the next 72 hours. Do no strenuous exercise or activity for 1 week. You may drive when you are no longer taking prescription pain medication, you can comfortably wear a seatbelt, and you can maneuver your car. 10.You may need to see your doctor in the office for a follow-up appointment.  Please       check with your doctor.  11.When you receive a new Port-a-Cath, you will get a product guide and        ID card.  Please keep them in case you need them.  WHEN TO CALL YOUR DOCTOR ([phone]): Fever over 101.0 Chills Continued bleeding from incision Increased redness and tenderness at the site Shortness of breath, difficulty breathing   The clinic staff is available to answer your questions during regular business hours. Please don't hesitate to call and ask to speak to one of the nurses or medical assistants for clinical concerns. If you have a medical emergency, go to the nearest emergency room or call 911.  A surgeon from [facility] is always on call at the hospital.     For further information, please visit [email or web address]   Post Anesthesia Home Care Instructions  Activity: Get plenty of rest for the remainder of the day. A responsible individual must stay with you for 24 hours following the procedure.  For the next 24 hours, DO NOT: -Drive a car -Operate machinery -Drink alcoholic beverages -Take any medication unless instructed by your physician -Make   any legal decisions or sign important papers.  Meals: Start with liquid foods such as gelatin or soup. Progress to regular foods as tolerated. Avoid greasy, spicy, heavy foods. If nausea and/or vomiting occur, drink only clear liquids until the nausea and/or vomiting subsides. Call your physician if vomiting continues.  Special Instructions/Symptoms: Your throat may feel dry or sore from the anesthesia or the breathing tube placed  in your throat during surgery. If this causes discomfort, gargle with warm salt water. The discomfort should disappear within 24 hours.  If you had a scopolamine patch placed behind your ear for the management of post- operative nausea and/or vomiting:  1. The medication in the patch is effective for 72 hours, after which it should be removed.  Wrap patch in a tissue and discard in the trash. Wash hands thoroughly with soap and water. 2. You may remove the patch earlier than 72 hours if you experience unpleasant side effects which may include dry mouth, dizziness or visual disturbances. 3. Avoid touching the patch. Wash your hands with soap and water after contact with the patch.         

## 2020-08-23 NOTE — Anesthesia Procedure Notes (Signed)
Procedure Name: LMA Insertion Date/Time: 08/23/2020 7:34 AM Performed by: Maryella Shivers, CRNA Pre-anesthesia Checklist: Patient identified, Emergency Drugs available, Suction available and Patient being monitored Patient Re-evaluated:Patient Re-evaluated prior to induction Oxygen Delivery Method: Circle system utilized Preoxygenation: Pre-oxygenation with 100% oxygen Induction Type: IV induction Ventilation: Mask ventilation without difficulty LMA: LMA inserted LMA Size: 4.0 Number of attempts: 1 Airway Equipment and Method: Bite block Placement Confirmation: positive ETCO2 Tube secured with: Tape Dental Injury: Teeth and Oropharynx as per pre-operative assessment

## 2020-08-23 NOTE — Transfer of Care (Signed)
Immediate Anesthesia Transfer of Care Note  Patient: Ellen [name]  Procedure(s) Performed: INSERTION PORT-A-CATH (Right: Chest)  Patient Location: PACU  Anesthesia Type:General  Level of Consciousness: drowsy  Airway & Oxygen Therapy: Patient Spontanous Breathing and Patient connected to face mask oxygen  Post-op Assessment: Report given to RN and Post -op Vital signs reviewed and stable  Post vital signs: Reviewed and stable  Last Vitals:  Vitals Value Taken Time  BP 110/70 08/23/20 0838  Temp    Pulse 74 08/23/20 0839  Resp 14 08/23/20 0839  SpO2 100 % 08/23/20 0839  Vitals shown include unvalidated device data.  Last Pain:  Vitals:   08/23/20 0707  TempSrc: Oral  PainSc: 0-No pain         Complications: No notable events documented.

## 2020-08-24 ENCOUNTER — Ambulatory Visit
Admission: RE | Admit: 2020-08-24 | Discharge: 2020-08-24 | Disposition: A | Discharge status: Home / Self Care | Payer: Medicaid Other | Source: Ambulatory Visit | Attending: Radiation Oncology | Admitting: Radiation Oncology

## 2020-08-24 ENCOUNTER — Encounter (HOSPITAL_BASED_OUTPATIENT_CLINIC_OR_DEPARTMENT_OTHER): Payer: Self-pay | Admitting: Surgery

## 2020-08-24 DIAGNOSIS — C7951 Secondary malignant neoplasm of bone: Secondary | ICD-10-CM | POA: Diagnosis not present

## 2020-08-24 DIAGNOSIS — G893 Neoplasm related pain (acute) (chronic): Secondary | ICD-10-CM | POA: Diagnosis not present

## 2020-08-24 DIAGNOSIS — Z510 Encounter for antineoplastic radiation therapy: Secondary | ICD-10-CM | POA: Diagnosis not present

## 2020-08-24 DIAGNOSIS — C50912 Malignant neoplasm of unspecified site of left female breast: Secondary | ICD-10-CM | POA: Diagnosis not present

## 2020-08-24 NOTE — Progress Notes (Signed)
..  The following Medication: Zoladex has been approved thru [place] as Assistance Program. Enrollment period is 08/22/2020 to 08/22/2021.  Assistance ID: [id number].  Reason for Assistance: Self Pay. First DOS: 08/17/2020.

## 2020-08-24 NOTE — Progress Notes (Signed)
..  The following Medication: Madalyn Rob is approved for drug replacement program by [employer]. The enrollment period is from 08/24/2020 to 08/24/2021.  Reason for Assistance: Self Pay - Medicaid pending. ID: [id number] First DOS:09/05/2020.

## 2020-08-27 ENCOUNTER — Ambulatory Visit
Admission: RE | Admit: 2020-08-27 | Discharge: 2020-08-27 | Disposition: A | Discharge status: Home / Self Care | Payer: Medicaid Other | Source: Ambulatory Visit | Attending: Radiation Oncology | Admitting: Radiation Oncology

## 2020-08-27 ENCOUNTER — Other Ambulatory Visit: Payer: Self-pay

## 2020-08-27 DIAGNOSIS — C7951 Secondary malignant neoplasm of bone: Secondary | ICD-10-CM | POA: Diagnosis not present

## 2020-08-27 DIAGNOSIS — C50912 Malignant neoplasm of unspecified site of left female breast: Secondary | ICD-10-CM | POA: Diagnosis not present

## 2020-08-27 DIAGNOSIS — G893 Neoplasm related pain (acute) (chronic): Secondary | ICD-10-CM | POA: Diagnosis not present

## 2020-08-27 DIAGNOSIS — Z510 Encounter for antineoplastic radiation therapy: Secondary | ICD-10-CM | POA: Diagnosis not present

## 2020-08-28 ENCOUNTER — Other Ambulatory Visit: Payer: Self-pay | Admitting: Genetic Counselor

## 2020-08-28 ENCOUNTER — Ambulatory Visit
Admission: RE | Admit: 2020-08-28 | Discharge: 2020-08-28 | Disposition: A | Discharge status: Home / Self Care | Payer: Medicaid Other | Source: Ambulatory Visit | Attending: Radiation Oncology | Admitting: Radiation Oncology

## 2020-08-28 ENCOUNTER — Inpatient Hospital Stay: Payer: Medicaid Other

## 2020-08-28 ENCOUNTER — Inpatient Hospital Stay (HOSPITAL_BASED_OUTPATIENT_CLINIC_OR_DEPARTMENT_OTHER): Payer: Medicaid Other | Admitting: Genetic Counselor

## 2020-08-28 DIAGNOSIS — C50919 Malignant neoplasm of unspecified site of unspecified female breast: Secondary | ICD-10-CM

## 2020-08-28 DIAGNOSIS — Z800 Family history of malignant neoplasm of digestive organs: Secondary | ICD-10-CM

## 2020-08-28 DIAGNOSIS — C50912 Malignant neoplasm of unspecified site of left female breast: Secondary | ICD-10-CM | POA: Diagnosis not present

## 2020-08-28 DIAGNOSIS — Z510 Encounter for antineoplastic radiation therapy: Secondary | ICD-10-CM | POA: Diagnosis not present

## 2020-08-28 DIAGNOSIS — G893 Neoplasm related pain (acute) (chronic): Secondary | ICD-10-CM | POA: Diagnosis not present

## 2020-08-28 DIAGNOSIS — C7951 Secondary malignant neoplasm of bone: Secondary | ICD-10-CM | POA: Diagnosis not present

## 2020-08-28 DIAGNOSIS — Z808 Family history of malignant neoplasm of other organs or systems: Secondary | ICD-10-CM

## 2020-08-28 LAB — CMP (CANCER CENTER ONLY)
ALT: 290 U/L (ref 0–44)
AST: 87 U/L — ABNORMAL HIGH (ref 15–41)
Albumin: 4 g/dL (ref 3.5–5.0)
Alkaline Phosphatase: 185 U/L — ABNORMAL HIGH (ref 38–126)
Anion gap: 7 (ref 5–15)
BUN: 17 mg/dL (ref 6–20)
CO2: 29 mmol/L (ref 22–32)
Calcium: 10 mg/dL (ref 8.9–10.3)
Chloride: 101 mmol/L (ref 98–111)
Creatinine: 0.84 mg/dL (ref 0.44–1.00)
GFR, Estimated: >60 mL/min (ref >60)
Glucose, Bld: 94 mg/dL (ref 70–99)
Potassium: 4.8 mmol/L (ref 3.5–5.1)
Sodium: 137 mmol/L (ref 135–145)
Total Bilirubin: 0.4 mg/dL (ref 0.3–1.2)
Total Protein: 7.8 g/dL (ref 6.5–8.1)

## 2020-08-28 LAB — CBC WITH DIFFERENTIAL (CANCER CENTER ONLY)
Abs Immature Granulocytes: 0.03 10*3/uL (ref 0.00–0.07)
Basophils Absolute: 0 10*3/uL (ref 0.0–0.1)
Basophils Relative: 0 %
Eosinophils Absolute: 0.3 10*3/uL (ref 0.0–0.5)
Eosinophils Relative: 5 %
HCT: 42 % (ref 36.0–46.0)
Hemoglobin: 13.9 g/dL (ref 12.0–15.0)
Immature Granulocytes: 1 %
Lymphocytes Relative: 18 %
Lymphs Abs: 1.1 10*3/uL (ref 0.7–4.0)
MCH: 30.3 pg (ref 26.0–34.0)
MCHC: 33.1 g/dL (ref 30.0–36.0)
MCV: 91.7 fL (ref 80.0–100.0)
Monocytes Absolute: 0.5 10*3/uL (ref 0.1–1.0)
Monocytes Relative: 9 %
Neutro Abs: 4.1 10*3/uL (ref 1.7–7.7)
Neutrophils Relative %: 67 %
Platelet Count: 208 10*3/uL (ref 150–400)
RBC: 4.58 MIL/uL (ref 3.87–5.11)
RDW: 11.9 % (ref 11.5–15.5)
WBC Count: 6 10*3/uL (ref 4.0–10.5)
nRBC: 0 % (ref 0.0–0.2)

## 2020-08-28 LAB — GENETIC SCREENING ORDER

## 2020-08-29 ENCOUNTER — Other Ambulatory Visit: Payer: Self-pay

## 2020-08-29 ENCOUNTER — Ambulatory Visit
Admission: RE | Admit: 2020-08-29 | Discharge: 2020-08-29 | Disposition: A | Discharge status: Home / Self Care | Payer: Medicaid Other | Source: Ambulatory Visit | Attending: Radiation Oncology | Admitting: Radiation Oncology

## 2020-08-29 ENCOUNTER — Encounter: Payer: Self-pay | Admitting: Genetic Counselor

## 2020-08-29 ENCOUNTER — Encounter: Payer: Self-pay | Admitting: Hematology and Oncology

## 2020-08-29 DIAGNOSIS — G893 Neoplasm related pain (acute) (chronic): Secondary | ICD-10-CM | POA: Diagnosis not present

## 2020-08-29 DIAGNOSIS — Z800 Family history of malignant neoplasm of digestive organs: Secondary | ICD-10-CM | POA: Insufficient documentation

## 2020-08-29 DIAGNOSIS — Z808 Family history of malignant neoplasm of other organs or systems: Secondary | ICD-10-CM | POA: Insufficient documentation

## 2020-08-29 DIAGNOSIS — C50912 Malignant neoplasm of unspecified site of left female breast: Secondary | ICD-10-CM | POA: Diagnosis not present

## 2020-08-29 DIAGNOSIS — C7951 Secondary malignant neoplasm of bone: Secondary | ICD-10-CM | POA: Diagnosis not present

## 2020-08-29 DIAGNOSIS — Z510 Encounter for antineoplastic radiation therapy: Secondary | ICD-10-CM | POA: Diagnosis not present

## 2020-08-29 NOTE — Progress Notes (Signed)
REFERRING PROVIDER: [place]ia Phlegm, NP [place],  [place] [id number]  PRIMARY PROVIDER:  Pcp, No  PRIMARY REASON FOR VISIT:  1. Metastatic breast cancer ([place])   2. Family history of thyroid cancer   3. Family history of pancreatic cancer      HISTORY OF PRESENT ILLNESS:   [name], a 25 y.o. female, was seen for a [facility] cancer genetics consultation at the request of Dr. Delice Bison due to a personal and family history of cancer.  Ellen [name] presents to clinic today to discuss the possibility of a hereditary predisposition to cancer, genetic testing, and to further clarify her future cancer risks, as well as potential cancer risks for family members.   In July of 2022, at the age of 6, Ellen [name] was diagnosed with invasive ductal carcinoma of the left breast. The tumor is ER+/PR+/Her2+. The treatment plan includes chemotherapy and palliative radiation to spine and pelvic metastases.    CANCER HISTORY:  Oncology History  Metastatic breast cancer ([place])  08/15/2020 Imaging   Large mass in the medial left breast measuring 3.5 cm.  Prominent left axillary lymph node cluster of adjacent lymph nodes, irregular nodule right middle lobe 1.1 cm, aggressive lesion right second rib cortical destruction, multiple lucent lesions throughout the thoracic spine including T1 vertebral body and T2, irregular multilobar lesion central left hepatic lobe 4.3 x 4.7 cm.  Several satellite lesions in the left lateral hepatic lobe.  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  No live births.  OCP use for approximately 0 years.  Ovaries intact: yes.  Hysterectomy: no.  Menopausal status: premenopausal.  HRT use: 0 years. Colonoscopy: no; not examined. Mammogram within the last year: yes. Any excessive radiation exposure in the past: no.   Past Medical History:  Diagnosis Date   Anxiety    Asthma    Exercise Induced   Depression    Family history of pancreatic cancer    Family history of thyroid cancer    Herpes simplex     Past Surgical History:  Procedure Laterality Date   NO PAST SURGERIES     PORTACATH PLACEMENT Right 08/23/2020   Procedure: INSERTION PORT-A-CATH;  Surgeon: [name], MD;  Location: [facility];  Service: General;  Laterality: Right;    Social History   Socioeconomic History   Marital status: Single    Spouse name: Not on file   Number of children: Not on file   Years of education: Not on file   Highest education level: Not on file  Occupational History   Not on file  Tobacco Use   Smoking status: Never   Smokeless tobacco: Never  Vaping Use   Vaping Use: Every day   Substances: THC  Substance and Sexual Activity   Alcohol use: Yes    Alcohol/week: 7.0 standard drinks    Types: 7 Glasses of wine per week   Drug use: Yes    Types: Marijuana   Sexual activity: Yes    Birth control/protection: I.U.D.  Other Topics Concern   Not on file  Social History Narrative   Menarche at age 38, has a cycles every 28 days that lasts about 11 days, 4 days of spotting, two days of heavy flow, and the other days mild to moderate flow.  Received Depo  provera shot x 3 years, Nexplanon x 2 years, and currently has kyleena IUD.  No OCPs.  She has never been pregnant.    Social Determinants of Health   Financial Resource Strain: Not on file  Food Insecurity: No Food Insecurity   Worried About Charity fundraiser in the Last Year: Never true   Ran Out of Food in the Last Year: Never true  Transportation Needs: No Transportation Needs   Lack of Transportation (Medical): No   Lack of Transportation (Non-Medical): No  Physical Activity: Not on file  Stress: Not on file  Social Connections: Not on file     FAMILY HISTORY:  We obtained a detailed, 4-generation family history.  Significant diagnoses are listed below: Family History  Problem Relation Age of Onset   Depression Mother    Hyperlipidemia Mother    Thyroid cancer Mother 77       papillary thyroid cancer   Hypertension Father    Atrial fibrillation Father    Irritable bowel syndrome Father        also brother   ADD / ADHD Brother    Bipolar disorder Brother    Diabetes Maternal Grandfather    Heart attack Paternal Great-grandfather 27   Pancreatic cancer Maternal Great-grandfather 88       (MGM's father)   Ellen [name] does not have any children. She has one brother (age 70) and one sister (age 76), neither of whom have had cancer.   Ellen [name] mother is alive at age 13 and has a history of papillary thyroid cancer diagnosed at age 29. There are two maternal uncles (one is a half-sibling to her mother). There is no known cancer among maternal uncles or maternal cousins. Ellen [name] maternal grandmother is alive at age 17 without cancer. Her maternal grandfather is alive at age 85 without cancer. A great-grandfather (MGM's father) had pancreatic cancer at age 77.  Ellen [name] father is alive at age 29 without cancer. There are four paternal aunts. There is no known cancer among paternal aunts/uncles or paternal cousins. Ellen [name] paternal grandparents are alive in  their 50s without cancer.   Ellen [name] is unaware of previous family history of genetic testing for hereditary cancer risks. Patient's maternal ancestors are of [place], [place], [place], and [place] descent, and paternal ancestors are of [place] but otherwise unknown descent. There is no reported Ashkenazi Jewish ancestry. There is no known consanguinity.  GENETIC COUNSELING ASSESSMENT: Ms. Strohman is a 25 y.o. female with a personal history of young-onset breast cancer and a family history of thyroid cancer and pancreatic cancer, which is somewhat suggestive of a hereditary cancer syndrome and predisposition to cancer. We, therefore, discussed and recommended the following at today's visit.   DISCUSSION: We discussed that  approximately 5-10% of breast cancer is hereditary, meaning that it is due to a mutation in a single gene that is passed down from generation to generation in a family. A higher percentage of individuals with breast cancer diagnosed younger than 30 will have an identified mutation on genetic testing. Most cases of hereditary breast cancer are associated with the BRCA1 and BRCA2 genes. There are other genes that can be associated with hereditary breast cancer syndromes. These include ATM, CHEK2, PALB2, TP53, etc. We discussed that testing is beneficial for several reasons, including knowing about other cancer risks, identifying potential screening and risk-reduction options that may be appropriate, and to understand if other family members could be at risk for cancer and allow them to undergo genetic testing.  We reviewed the characteristics, features and inheritance patterns of hereditary cancer syndromes. We also discussed genetic testing, including the appropriate family members to test, the process of testing, insurance coverage and turn-around-time for results. We discussed the implications of a negative, positive and/or variant of uncertain significant result. We  recommended Ms. Cunning pursue genetic testing for the [facility] CancerNext-Expanded + RNAinsight panel.   The CancerNext-Expanded + RNAinsight gene panel offered by [facility] and includes sequencing and rearrangement analysis for the following 77 genes: AIP, ALK, APC, ATM, AXIN2, BAP1, BARD1, BLM, BMPR1A, BRCA1, BRCA2, BRIP1, CDC73, CDH1, CDK4, CDKN1B, CDKN2A, CHEK2, CTNNA1, DICER1, FANCC, FH, FLCN, GALNT12, KIF1B, LZTR1, MAX, MEN1, MET, MLH1, MSH2, MSH3, MSH6, MUTYH, NBN, NF1, NF2, NTHL1, PALB2, PHOX2B, PMS2, POT1, PRKAR1A, PTCH1, PTEN, RAD51C, RAD51D, RB1, RECQL, RET, SDHA, SDHAF2, SDHB, SDHC, SDHD, SMAD4, SMARCA4, SMARCB1, SMARCE1, STK11, SUFU, TMEM127, TP53, TSC1, TSC2, VHL and XRCC2 (sequencing and deletion/duplication); EGFR, EGLN1, HOXB13, KIT, MITF, PDGFRA, POLD1 and POLE (sequencing only); EPCAM and GREM1 (deletion/duplication only). RNA data is routinely analyzed for use in variant interpretation for all genes.  Based on Ms. Kabir's personal and family history of cancer, she meets medical criteria for genetic testing. However, since she is currently uninsured, the self-pay cost of testing will be $250. We discussed that, if this is more than she is able to pay, the laboratory may be able to lower cost of testing based on household size/income.   PLAN: After considering the risks, benefits, and limitations, Ms. Credeur provided informed consent to pursue genetic testing and the blood sample was sent to [facility] for analysis of the CancerNext-Expanded + RNAinsight panel. Results should be available within approximately two-three weeks' time, at which point they will be disclosed by telephone to Ms. Shawn, as will any additional recommendations warranted by these results. Ms. Stellmach will receive a summary of her genetic counseling visit and a copy of her results once available. This information will also be available in Epic.   Ms. Helling questions were answered to her satisfaction  today. Our contact information was provided should additional questions or concerns arise. Thank you for the referral and allowing [place] to share in the care of your patient.   Clint Guy, [place], [facility] Licensed, Certified Dispensing [email or web address] Phone: [phone]  The patient was seen for a total of 35 minutes in face-to-face genetic counseling. Patient was seen with her mother. This patient was discussed with Drs. Magrinat, Lindi Adie and/or Burr Medico who agrees with the above.    _______________________________________________________________________ For Office Staff:  Number of people involved in session: 2 Was an Intern/ student involved with case: yes - student observation only

## 2020-08-30 ENCOUNTER — Ambulatory Visit (HOSPITAL_COMMUNITY)
Admission: RE | Admit: 2020-08-30 | Discharge: 2020-08-30 | Disposition: A | Discharge status: Home / Self Care | Payer: Medicaid Other | Source: Ambulatory Visit | Attending: Adult Health | Admitting: Adult Health

## 2020-08-30 ENCOUNTER — Other Ambulatory Visit: Payer: Self-pay | Admitting: Pharmacist

## 2020-08-30 ENCOUNTER — Inpatient Hospital Stay: Payer: Medicaid Other

## 2020-08-30 ENCOUNTER — Other Ambulatory Visit: Payer: Self-pay

## 2020-08-30 ENCOUNTER — Ambulatory Visit
Admission: RE | Admit: 2020-08-30 | Discharge: 2020-08-30 | Disposition: A | Discharge status: Home / Self Care | Payer: Medicaid Other | Source: Ambulatory Visit | Attending: Radiation Oncology | Admitting: Radiation Oncology

## 2020-08-30 DIAGNOSIS — N6320 Unspecified lump in the left breast, unspecified quadrant: Secondary | ICD-10-CM | POA: Diagnosis not present

## 2020-08-30 DIAGNOSIS — Z01810 Encounter for preprocedural cardiovascular examination: Secondary | ICD-10-CM | POA: Diagnosis present

## 2020-08-30 DIAGNOSIS — C50912 Malignant neoplasm of unspecified site of left female breast: Secondary | ICD-10-CM | POA: Diagnosis not present

## 2020-08-30 DIAGNOSIS — G893 Neoplasm related pain (acute) (chronic): Secondary | ICD-10-CM | POA: Diagnosis not present

## 2020-08-30 DIAGNOSIS — Z510 Encounter for antineoplastic radiation therapy: Secondary | ICD-10-CM | POA: Diagnosis not present

## 2020-08-30 DIAGNOSIS — Z0189 Encounter for other specified special examinations: Secondary | ICD-10-CM

## 2020-08-30 DIAGNOSIS — C7951 Secondary malignant neoplasm of bone: Secondary | ICD-10-CM | POA: Diagnosis not present

## 2020-08-30 LAB — ECHOCARDIOGRAM COMPLETE
Area-P 1/2: 3.42 cm2
Calc EF: 60.1 %
S' Lateral: 2.5 cm
Single Plane A2C EF: 60.8 %
Single Plane A4C EF: 61 %

## 2020-08-30 NOTE — Progress Notes (Signed)
  Echocardiogram 2D Echocardiogram has been performed.  Michiel Cowboy 08/30/2020, 9:56 AM

## 2020-08-30 NOTE — Progress Notes (Signed)
The following Medication: Perjeta is approved for drug replacement program by [place]. The enrollment period is from 08/30/2020 to 08/30/2021.  Reason for Assistance: Self. ID: [id number] First DOS:08/31/2020.  Madalyn Rob, CPhT IV Drug Replacement Specialist  [place] Phone: [phone]

## 2020-08-31 ENCOUNTER — Ambulatory Visit
Admission: RE | Admit: 2020-08-31 | Discharge: 2020-08-31 | Disposition: A | Discharge status: Home / Self Care | Payer: Medicaid Other | Source: Ambulatory Visit | Attending: Radiation Oncology | Admitting: Radiation Oncology

## 2020-08-31 ENCOUNTER — Inpatient Hospital Stay: Payer: Medicaid Other

## 2020-08-31 VITALS — BP 113/68 | HR 94 | Temp 98.2°F | Resp 16 | Ht 65.0 in | Wt 141.8 lb

## 2020-08-31 DIAGNOSIS — C50919 Malignant neoplasm of unspecified site of unspecified female breast: Secondary | ICD-10-CM

## 2020-08-31 DIAGNOSIS — G893 Neoplasm related pain (acute) (chronic): Secondary | ICD-10-CM | POA: Diagnosis not present

## 2020-08-31 DIAGNOSIS — Z510 Encounter for antineoplastic radiation therapy: Secondary | ICD-10-CM | POA: Diagnosis not present

## 2020-08-31 DIAGNOSIS — C50912 Malignant neoplasm of unspecified site of left female breast: Secondary | ICD-10-CM | POA: Diagnosis not present

## 2020-08-31 DIAGNOSIS — C7951 Secondary malignant neoplasm of bone: Secondary | ICD-10-CM | POA: Diagnosis not present

## 2020-08-31 MED ORDER — HEPARIN SOD (PORK) LOCK FLUSH 100 UNIT/ML IV SOLN
500.0000 [IU] | Freq: Once | INTRAVENOUS | Status: AC | PRN
  Administered 2020-08-31: 500 [IU]
  Filled 2020-08-31: qty 5

## 2020-08-31 MED ORDER — SODIUM CHLORIDE 0.9 % IV SOLN
420.0000 mg | Freq: Once | INTRAVENOUS | Status: AC
  Administered 2020-08-31: 420 mg via INTRAVENOUS
  Filled 2020-08-31: qty 14

## 2020-08-31 MED ORDER — DIPHENHYDRAMINE HCL 25 MG PO CAPS
ORAL_CAPSULE | ORAL | Status: AC
  Filled 2020-08-31: qty 1

## 2020-08-31 MED ORDER — TRASTUZUMAB-DKST CHEMO 150 MG IV SOLR
8.0000 mg/kg | Freq: Once | INTRAVENOUS | Status: AC
  Administered 2020-08-31: 504 mg via INTRAVENOUS
  Filled 2020-08-31: qty 24

## 2020-08-31 MED ORDER — DIPHENHYDRAMINE HCL 25 MG PO CAPS
25.0000 mg | ORAL_CAPSULE | Freq: Once | ORAL | Status: AC
Start: 2020-08-31 — End: 2020-08-31
  Administered 2020-08-31: 25 mg via ORAL

## 2020-08-31 MED ORDER — ACETAMINOPHEN 325 MG PO TABS
ORAL_TABLET | ORAL | Status: AC
  Filled 2020-08-31: qty 2

## 2020-08-31 MED ORDER — SODIUM CHLORIDE 0.9% FLUSH
10.0000 mL | INTRAVENOUS | Status: DC | PRN
  Administered 2020-08-31: 10 mL
  Filled 2020-08-31: qty 10

## 2020-08-31 MED ORDER — ACETAMINOPHEN 325 MG PO TABS
650.0000 mg | ORAL_TABLET | Freq: Once | ORAL | Status: AC
  Administered 2020-08-31: 650 mg via ORAL

## 2020-08-31 MED ORDER — SODIUM CHLORIDE 0.9 % IV SOLN
Freq: Once | INTRAVENOUS | Status: AC
Start: 2020-08-31 — End: 2020-08-31
  Filled 2020-08-31: qty 250

## 2020-08-31 NOTE — Progress Notes (Signed)
The following Medication: Ellen [name] has been approved thru [employer] as Assistance Program. Enrollment period is 08/27/2020 to 08/27/2021.  Assistance ID: [id number]. Reason for Assistance: Self First DOS: TBD  Madalyn Rob, CPhT IV Drug Replacement Specialist  [place] Phone: [phone]

## 2020-08-31 NOTE — Patient Instructions (Signed)
[facility] MEDICAL [facility]COLOGY   Discharge Instructions: Thank you for choosing [facility] to provide your oncology and hematology care.   If you have a lab appointment with the Cancer Center, please go directly to the Cancer Center and check in at the registration area.   Wear comfortable clothing and clothing appropriate for easy access to any Portacath or PICC line.   We strive to give you quality time with your provider. You may need to reschedule your appointment if you arrive late (15 or more minutes).  Arriving late affects you and other patients whose appointments are after yours.  Also, if you miss three or more appointments without notifying the office, you may be dismissed from the clinic at the provider's discretion.      For prescription refill requests, have your pharmacy contact our office and allow 72 hours for refills to be completed.    Today you received the following chemotherapy and/or immunotherapy agents: Trastuzumab (Herceptin) and Pertuzumab (Perjeta)      To help prevent nausea and vomiting after your treatment, we encourage you to take your nausea medication as directed.  BELOW ARE SYMPTOMS THAT SHOULD BE REPORTED IMMEDIATELY: *FEVER GREATER THAN 100.4 F (38 C) OR HIGHER *CHILLS OR SWEATING *NAUSEA AND VOMITING THAT IS NOT CONTROLLED WITH YOUR NAUSEA MEDICATION *UNUSUAL SHORTNESS OF BREATH *UNUSUAL BRUISING OR BLEEDING *URINARY PROBLEMS (pain or burning when urinating, or frequent urination) *BOWEL PROBLEMS (unusual diarrhea, constipation, pain near the anus) TENDERNESS IN MOUTH AND THROAT WITH OR WITHOUT PRESENCE OF ULCERS (sore throat, sores in mouth, or a toothache) UNUSUAL RASH, SWELLING OR PAIN  UNUSUAL VAGINAL DISCHARGE OR ITCHING   Items with * indicate a potential emergency and should be followed up as soon as possible or go to the Emergency Department if any problems should occur.  Please show the CHEMOTHERAPY ALERT CARD or  IMMUNOTHERAPY ALERT CARD at check-in to the Emergency Department and triage nurse.  Should you have questions after your visit or need to cancel or reschedule your appointment, please contact [facility]  Dept: [phone]  and follow the prompts.  Office hours are 8:00 a.m. to 4:30 p.m. Monday - Friday. Please note that voicemails left after 4:00 p.m. may not be returned until the following business day.  We are closed weekends and major holidays. You have access to a nurse at all times for urgent questions. Please call the main number to the clinic Dept: [phone] and follow the prompts.   For any non-urgent questions, you may also contact your provider using MyChart. We now offer e-Visits for anyone 18 and older to request care online for non-urgent symptoms. For details visit [email or web address].   Also download the MyChart app! Go to the app store, search "MyChart", open the app, select , and log in with your MyChart username and password.  Due to Covid, a mask is required upon entering the hospital/clinic. If you do not have a mask, one will be given to you upon arrival. For doctor visits, patients may have 1 support person aged 18 or older with them. For treatment visits, patients cannot have anyone with them due to current Covid guidelines and our immunocompromised population.   

## 2020-09-03 ENCOUNTER — Ambulatory Visit: Payer: Medicaid Other

## 2020-09-03 NOTE — Progress Notes (Signed)
Patient Care Team: Pcp, No as PCP - General [name], MD as Consulting Physician (Hematology and Oncology) [name], [name], NP as Nurse Practitioner (Hematology and Oncology) [name], MD as Consulting Physician (General Surgery) Mauro Kaufmann, RN as Oncology Nurse Navigator Rockwell Germany, RN as Oncology Nurse Navigator  DIAGNOSIS:    ICD-10-CM   1. 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 Metastatic breast cancer ([place])  C50.919       SUMMARY OF ONCOLOGIC HISTORY: Oncology History  Metastatic breast cancer ([place])  08/15/2020 Imaging   Large mass in the medial left breast measuring 3.5 cm.  Prominent left axillary lymph node cluster of adjacent lymph nodes, irregular nodule right middle lobe 1.1 cm, aggressive lesion right second rib cortical destruction, multiple lucent lesions throughout the thoracic spine including T1 vertebral body and T2, irregular multilobar lesion central left hepatic lobe 4.3 x 4.7 cm.  Several satellite lesions in the left lateral hepatic lobe.  Multiple sclerotic lesions in the bones iliac wings, acetabular, medial iliac bones, sacrum multiple lesions lumbar spine L3-L4 and L5   08/17/2020 Initial Diagnosis   Biopsy revealed IDC with DCIS grade 2, ER 30%, PR 20%, HER2 equivocal by IHC, FISH positive, Ki-67 25%, lymph node positive   08/31/2020 -  Chemotherapy    Patient is on Treatment Plan: BREAST DOCETAXEL + TRASTUZUMAB + PERTUZUMAB (THP) Q21D X 8 CYCLES / TRASTUZUMAB + PERTUZUMAB Q21D X 4 CYCLES       Carcinoma of breast metastatic to bone ([place])  08/17/2020 Initial Diagnosis   Carcinoma of breast metastatic to bone ([place])    08/31/2020 -  Chemotherapy    Patient is on Treatment Plan: BREAST DOCETAXEL + TRASTUZUMAB + PERTUZUMAB (THP) Q21D X 8 CYCLES / TRASTUZUMAB + PERTUZUMAB Q21D X 4 CYCLES         CHIEF COMPLIANT:  Cycle 2 Taxotere (Herceptin Perjeta was given 4 days ago)  INTERVAL  HISTORY: Ellen [name] is a 25 y.o. with above-mentioned history of metastatic left breast cancer, currently on chemotherapy with Taxotere Herceptin Perjeta. She reports to the clinic today for treatment.  Today is her last day for radiation because she missed yesterday's radiation treatment because of abdominal symptoms and nausea.  Nausea appears to have improved significantly.  She felt significantly bloated.  She had diarrhea for couple of days after the infusion.  She took Imodium and the diarrhea subsided.  She had a symptom of UTI.  Her pain is improved significantly with radiation.  She does not take much of any pain medication.  ALLERGIES:  is allergic to other.  MEDICATIONS:  Current Outpatient Medications  Medication Sig Dispense Refill   acyclovir (ZOVIRAX) 800 MG tablet Take 800 mg by mouth 5 (five) times daily.     amphetamine-dextroamphetamine (ADDERALL XR) 15 MG 24 hr capsule Take 15 mg by mouth daily. Takes 15 mg on work days     gabapentin (NEURONTIN) 100 MG capsule Take 100 mg by mouth 3 (three) times daily.     levonorgestrel (KYLEENA) 19.5 MG IUD by Intrauterine route.     lidocaine-prilocaine (EMLA) cream Apply to affected area once 30 g 3   LORazepam (ATIVAN) 0.5 MG tablet Take 1 tablet (0.5 mg total) by mouth at bedtime as needed for sleep. 30 tablet 0   Multiple Vitamin (MULTIVITAMINS PO) Take by mouth daily.     ondansetron (ZOFRAN) 8 MG tablet Take 1  tablet (8 mg total) by mouth 2 (two) times daily as needed for refractory nausea / vomiting. 30 tablet 1   oxyCODONE-acetaminophen (PERCOCET/ROXICET) 5-325 MG tablet Take 1 tablet by mouth every 8 (eight) hours as needed for severe pain. 60 tablet 0   prochlorperazine (COMPAZINE) 10 MG tablet Take 1 tablet (10 mg total) by mouth every 6 (six) hours as needed for nausea or vomiting. 30 tablet 1   No current facility-administered medications for this visit.    PHYSICAL EXAMINATION: ECOG PERFORMANCE STATUS: 1 -  Symptomatic but completely ambulatory  Vitals:   09/04/20 0911  BP: 122/71  Pulse: 91  Resp: 18  Temp: 97.7 F (36.5 C)  SpO2: 100%   Filed Weights   09/04/20 0911  Weight: 139 lb 4.8 oz (63.2 kg)    LABORATORY DATA:  I have reviewed the data as listed CMP Latest Ref Rng & Units 09/04/2020 08/28/2020 08/17/2020  Glucose 70 - 99 mg/dL 93 94 109(H)  BUN 6 - 20 mg/dL 13 17 17   Creatinine 0.44 - 1.00 mg/dL 0.72 0.84 0.73  Sodium 135 - 145 mmol/L 137 137 138  Potassium 3.5 - 5.1 mmol/L 4.0 4.8 3.9  Chloride 98 - 111 mmol/L 106 101 105  CO2 22 - 32 mmol/L 23 29 26   Calcium 8.9 - 10.3 mg/dL 9.0 10.0 9.9  Total Protein 6.5 - 8.1 g/dL 7.1 7.8 7.9  Total Bilirubin 0.3 - 1.2 mg/dL 0.6 0.4 0.7  Alkaline Phos 38 - 126 U/L 113 185(H) 98  AST 15 - 41 U/L 19 87(H) 30  ALT 0 - 44 U/L 36 290(HH) 17    Lab Results  Component Value Date   WBC 5.3 09/04/2020   HGB 13.2 09/04/2020   HCT 38.8 09/04/2020   MCV 88.2 09/04/2020   PLT 170 09/04/2020   NEUTROABS 3.3 09/04/2020    ASSESSMENT & PLAN:  Metastatic breast cancer ([place]) 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, FISH positive ratio 2.8, Ki-67 25%, lymph node positive  Palliative radiation completed 09/03/2020   Treatment plan: Palliative chemotherapy with Taxotere Herceptin Perjeta every 3 weeks x6 cycles followed by Herceptin Perjeta maintenance ------------------------------------------------------------------------------------------------------------------------------------ Current treatment: Cycle 1 Taxotere (Herceptin and Perjeta were given 4 days ago) Chemo consent obtained, chemo education completed Echocardiogram 08/30/2020: EF 60 to 65% I discontinue Neulasta injection because I suspect that she will not need it. She will finish her last radiation  treatment today. Elevated LFTs: Have improved significantly since Herceptin Perjeta was given.  Perjeta toxicity: Diarrhea improved with Imodium Abdominal bloating and nausea: Bone pain: Improved significantly  Return to clinic in 1 week for toxicity check    No orders of the defined types were placed in this encounter.  The patient has a good understanding of the overall plan. she agrees with it. she will call with any problems that may develop before the next visit here.  Total time spent: 30 mins including face to face time and time spent for planning, charting and coordination of care  Rulon Eisenmenger, MD, MPH 09/04/2020  I, Thana Ates, am acting as scribe for Dr. [name].  I have reviewed the above documentation for accuracy and completeness, and I agree with the above.

## 2020-09-04 ENCOUNTER — Other Ambulatory Visit: Payer: Self-pay | Admitting: Pharmacist

## 2020-09-04 ENCOUNTER — Inpatient Hospital Stay: Payer: Medicaid Other

## 2020-09-04 ENCOUNTER — Inpatient Hospital Stay (HOSPITAL_BASED_OUTPATIENT_CLINIC_OR_DEPARTMENT_OTHER): Payer: Medicaid Other | Admitting: Hematology and Oncology

## 2020-09-04 ENCOUNTER — Other Ambulatory Visit: Payer: Self-pay

## 2020-09-04 ENCOUNTER — Ambulatory Visit
Admission: RE | Admit: 2020-09-04 | Discharge: 2020-09-04 | Disposition: A | Discharge status: Home / Self Care | Payer: Medicaid Other | Source: Ambulatory Visit | Attending: Radiation Oncology | Admitting: Radiation Oncology

## 2020-09-04 ENCOUNTER — Encounter: Payer: Self-pay | Admitting: Urology

## 2020-09-04 VITALS — BP 113/74 | HR 77 | Temp 98.5°F | Resp 18

## 2020-09-04 VITALS — BP 122/71 | HR 91 | Temp 97.7°F | Resp 18 | Ht 65.0 in | Wt 139.3 lb

## 2020-09-04 DIAGNOSIS — C50919 Malignant neoplasm of unspecified site of unspecified female breast: Secondary | ICD-10-CM

## 2020-09-04 DIAGNOSIS — Z510 Encounter for antineoplastic radiation therapy: Secondary | ICD-10-CM | POA: Diagnosis not present

## 2020-09-04 DIAGNOSIS — Z95828 Presence of other vascular implants and grafts: Secondary | ICD-10-CM

## 2020-09-04 DIAGNOSIS — C50912 Malignant neoplasm of unspecified site of left female breast: Secondary | ICD-10-CM | POA: Diagnosis not present

## 2020-09-04 DIAGNOSIS — N390 Urinary tract infection, site not specified: Secondary | ICD-10-CM

## 2020-09-04 DIAGNOSIS — C7951 Secondary malignant neoplasm of bone: Secondary | ICD-10-CM

## 2020-09-04 DIAGNOSIS — G893 Neoplasm related pain (acute) (chronic): Secondary | ICD-10-CM | POA: Diagnosis not present

## 2020-09-04 LAB — URINALYSIS, COMPLETE (UACMP) WITH MICROSCOPIC
Bacteria, UA: NONE SEEN
Bilirubin Urine: NEGATIVE
Glucose, UA: NEGATIVE mg/dL
Hgb urine dipstick: NEGATIVE
Ketones, ur: NEGATIVE mg/dL
Leukocytes,Ua: NEGATIVE
Nitrite: POSITIVE — AB
Protein, ur: NEGATIVE mg/dL
Specific Gravity, Urine: 1.004 — ABNORMAL LOW (ref 1.005–1.030)
pH: 6 (ref 5.0–8.0)

## 2020-09-04 LAB — CBC WITH DIFFERENTIAL (CANCER CENTER ONLY)
Abs Immature Granulocytes: 0.04 10*3/uL (ref 0.00–0.07)
Basophils Absolute: 0 10*3/uL (ref 0.0–0.1)
Basophils Relative: 0 %
Eosinophils Absolute: 0.6 10*3/uL — ABNORMAL HIGH (ref 0.0–0.5)
Eosinophils Relative: 12 %
HCT: 38.8 % (ref 36.0–46.0)
Hemoglobin: 13.2 g/dL (ref 12.0–15.0)
Immature Granulocytes: 1 %
Lymphocytes Relative: 14 %
Lymphs Abs: 0.7 10*3/uL (ref 0.7–4.0)
MCH: 30 pg (ref 26.0–34.0)
MCHC: 34 g/dL (ref 30.0–36.0)
MCV: 88.2 fL (ref 80.0–100.0)
Monocytes Absolute: 0.6 10*3/uL (ref 0.1–1.0)
Monocytes Relative: 11 %
Neutro Abs: 3.3 10*3/uL (ref 1.7–7.7)
Neutrophils Relative %: 62 %
Platelet Count: 170 10*3/uL (ref 150–400)
RBC: 4.4 MIL/uL (ref 3.87–5.11)
RDW: 11.4 % — ABNORMAL LOW (ref 11.5–15.5)
WBC Count: 5.3 10*3/uL (ref 4.0–10.5)
nRBC: 0 % (ref 0.0–0.2)

## 2020-09-04 LAB — CMP (CANCER CENTER ONLY)
ALT: 36 U/L (ref 0–44)
AST: 19 U/L (ref 15–41)
Albumin: 3.6 g/dL (ref 3.5–5.0)
Alkaline Phosphatase: 113 U/L (ref 38–126)
Anion gap: 8 (ref 5–15)
BUN: 13 mg/dL (ref 6–20)
CO2: 23 mmol/L (ref 22–32)
Calcium: 9 mg/dL (ref 8.9–10.3)
Chloride: 106 mmol/L (ref 98–111)
Creatinine: 0.72 mg/dL (ref 0.44–1.00)
GFR, Estimated: >60 mL/min (ref >60)
Glucose, Bld: 93 mg/dL (ref 70–99)
Potassium: 4 mmol/L (ref 3.5–5.1)
Sodium: 137 mmol/L (ref 135–145)
Total Bilirubin: 0.6 mg/dL (ref 0.3–1.2)
Total Protein: 7.1 g/dL (ref 6.5–8.1)

## 2020-09-04 MED ORDER — DIPHENHYDRAMINE HCL 25 MG PO CAPS
25.0000 mg | ORAL_CAPSULE | Freq: Once | ORAL | Status: AC
  Administered 2020-09-04: 25 mg via ORAL

## 2020-09-04 MED ORDER — SODIUM CHLORIDE 0.9 % IV SOLN
75.0000 mg/m2 | Freq: Once | INTRAVENOUS | Status: AC
  Administered 2020-09-04: 130 mg via INTRAVENOUS
  Filled 2020-09-04: qty 13

## 2020-09-04 MED ORDER — SODIUM CHLORIDE 0.9 % IV SOLN
Freq: Once | INTRAVENOUS | Status: DC
  Filled 2020-09-04: qty 250

## 2020-09-04 MED ORDER — SODIUM CHLORIDE 0.9% FLUSH
10.0000 mL | INTRAVENOUS | Status: DC | PRN
  Administered 2020-09-04: 10 mL via INTRAVENOUS
  Filled 2020-09-04: qty 10

## 2020-09-04 MED ORDER — DEXAMETHASONE SODIUM PHOSPHATE 100 MG/10ML IJ SOLN
10.0000 mg | Freq: Once | INTRAMUSCULAR | Status: AC
  Administered 2020-09-04: 10 mg via INTRAVENOUS
  Filled 2020-09-04: qty 10

## 2020-09-04 MED ORDER — ACETAMINOPHEN 325 MG PO TABS
ORAL_TABLET | ORAL | Status: AC
  Filled 2020-09-04: qty 2

## 2020-09-04 MED ORDER — ACETAMINOPHEN 325 MG PO TABS
650.0000 mg | ORAL_TABLET | Freq: Once | ORAL | Status: AC
  Administered 2020-09-04: 650 mg via ORAL

## 2020-09-04 MED ORDER — DIPHENHYDRAMINE HCL 25 MG PO CAPS
ORAL_CAPSULE | ORAL | Status: AC
  Filled 2020-09-04: qty 1

## 2020-09-04 NOTE — Assessment & Plan Note (Signed)
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

## 2020-09-04 NOTE — Patient Instructions (Signed)
[facility] MEDICAL ONCOLOGY  Discharge Instructions: ?Thank you for choosing [facility] to provide your oncology and hematology care.  ? ?If you have a lab appointment with the Cancer Center, please go directly to the Cancer Center and check in at the registration area. ?  ?Wear comfortable clothing and clothing appropriate for easy access to any Portacath or PICC line.  ? ?We strive to give you quality time with your provider. You may need to reschedule your appointment if you arrive late (15 or more minutes).  Arriving late affects you and other patients whose appointments are after yours.  Also, if you miss three or more appointments without notifying the office, you may be dismissed from the clinic at the provider?s discretion.    ?  ?For prescription refill requests, have your pharmacy contact our office and allow 72 hours for refills to be completed.   ? ?Today you received the following chemotherapy and/or immunotherapy agents : Taxotere    ?  ?To help prevent nausea and vomiting after your treatment, we encourage you to take your nausea medication as directed. ? ?BELOW ARE SYMPTOMS THAT SHOULD BE REPORTED IMMEDIATELY: ?*FEVER GREATER THAN 100.4 F (38 ?C) OR HIGHER ?*CHILLS OR SWEATING ?*NAUSEA AND VOMITING THAT IS NOT CONTROLLED WITH YOUR NAUSEA MEDICATION ?*UNUSUAL SHORTNESS OF BREATH ?*UNUSUAL BRUISING OR BLEEDING ?*URINARY PROBLEMS (pain or burning when urinating, or frequent urination) ?*BOWEL PROBLEMS (unusual diarrhea, constipation, pain near the anus) ?TENDERNESS IN MOUTH AND THROAT WITH OR WITHOUT PRESENCE OF ULCERS (sore throat, sores in mouth, or a toothache) ?UNUSUAL RASH, SWELLING OR PAIN  ?UNUSUAL VAGINAL DISCHARGE OR ITCHING  ? ?Items with * indicate a potential emergency and should be followed up as soon as possible or go to the Emergency Department if any problems should occur. ? ?Please show the CHEMOTHERAPY ALERT CARD or IMMUNOTHERAPY ALERT CARD at check-in to  the Emergency Department and triage nurse. ? ?Should you have questions after your visit or need to cancel or reschedule your appointment, please contact [facility]  Dept: [phone]  and follow the prompts.  Office hours are 8:00 a.m. to 4:30 p.m. Monday - Friday. Please note that voicemails left after 4:00 p.m. may not be returned until the following business day.  We are closed weekends and major holidays. You have access to a nurse at all times for urgent questions. Please call the main number to the clinic Dept: [phone] and follow the prompts. ? ? ?For any non-urgent questions, you may also contact your provider using MyChart. We now offer e-Visits for anyone 18 and older to request care online for non-urgent symptoms. For details visit [email or web address]. ?  ?Also download the MyChart app! Go to the app store, search "MyChart", open the app, select [facility], and log in with your MyChart username and password. ? ?Due to Covid, a mask is required upon entering the hospital/clinic. If you do not have a mask, one will be given to you upon arrival. For doctor visits, patients may have 1 support person aged 18 or older with them. For treatment visits, patients cannot have anyone with them due to current Covid guidelines and our immunocompromised population.  ? ?

## 2020-09-04 NOTE — Progress Notes (Signed)
Per Dr. Lindi Adie patient is only going to receive Docetaxel today he wants her to receive all of her premeds ordered today since it is her first time

## 2020-09-05 ENCOUNTER — Inpatient Hospital Stay: Payer: Medicaid Other

## 2020-09-05 ENCOUNTER — Encounter: Payer: Self-pay | Admitting: *Deleted

## 2020-09-06 ENCOUNTER — Other Ambulatory Visit: Payer: Self-pay | Admitting: Adult Health

## 2020-09-06 ENCOUNTER — Encounter: Payer: Self-pay | Admitting: Hematology and Oncology

## 2020-09-06 ENCOUNTER — Other Ambulatory Visit (HOSPITAL_COMMUNITY): Payer: Self-pay

## 2020-09-06 DIAGNOSIS — Z1379 Encounter for other screening for genetic and chromosomal anomalies: Secondary | ICD-10-CM | POA: Insufficient documentation

## 2020-09-06 DIAGNOSIS — C50919 Malignant neoplasm of unspecified site of unspecified female breast: Secondary | ICD-10-CM

## 2020-09-06 LAB — CYTOLOGY - PAP
Comment: NEGATIVE
Diagnosis: UNDETERMINED — AB
High risk HPV: NEGATIVE

## 2020-09-06 MED ORDER — CHOLESTYRAMINE 4 G PO PACK
4.0000 g | PACK | Freq: Two times a day (BID) | ORAL | 12 refills | Status: DC | PRN
  Filled 2020-09-06: qty 60, 30d supply, fill #0

## 2020-09-06 MED ORDER — DIPHENOXYLATE-ATROPINE 2.5-0.025 MG PO TABS
1.0000 | ORAL_TABLET | Freq: Four times a day (QID) | ORAL | 0 refills | Status: DC | PRN
  Filled 2020-09-06: qty 30, 8d supply, fill #0

## 2020-09-06 NOTE — Progress Notes (Signed)
Patient with diarrhea, sending in lomotil and [place].  Wilber Bihari, NP

## 2020-09-07 ENCOUNTER — Telehealth: Payer: Self-pay | Admitting: Genetic Counselor

## 2020-09-07 ENCOUNTER — Encounter: Payer: Self-pay | Admitting: Hematology and Oncology

## 2020-09-07 ENCOUNTER — Other Ambulatory Visit (HOSPITAL_COMMUNITY): Payer: Self-pay

## 2020-09-07 ENCOUNTER — Inpatient Hospital Stay: Payer: Medicaid Other

## 2020-09-07 NOTE — Progress Notes (Signed)
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  I informed her of the [employer], went over what it covers and gave her the income requirement.  Pt would like to apply and since she's currently not working she will provide a letter of support on 09/10/20.  When approved, I will give her an expense sheet and my card for any questions or concerns she may have in the future.

## 2020-09-07 NOTE — Telephone Encounter (Signed)
LVM that her genetic test results are available and requested that she call back to discuss them.  

## 2020-09-09 ENCOUNTER — Other Ambulatory Visit: Payer: Self-pay

## 2020-09-09 ENCOUNTER — Inpatient Hospital Stay (HOSPITAL_COMMUNITY)
Admission: EM | Admit: 2020-09-09 | Discharge: 2020-09-19 | DRG: 683 | Disposition: A | Discharge status: Home / Self Care | Payer: Medicaid Other | Attending: Internal Medicine | Admitting: Internal Medicine

## 2020-09-09 ENCOUNTER — Encounter (HOSPITAL_COMMUNITY): Payer: Self-pay

## 2020-09-09 DIAGNOSIS — E876 Hypokalemia: Secondary | ICD-10-CM | POA: Diagnosis not present

## 2020-09-09 DIAGNOSIS — Z818 Family history of other mental and behavioral disorders: Secondary | ICD-10-CM

## 2020-09-09 DIAGNOSIS — F17290 Nicotine dependence, other tobacco product, uncomplicated: Secondary | ICD-10-CM | POA: Diagnosis present

## 2020-09-09 DIAGNOSIS — C7951 Secondary malignant neoplasm of bone: Secondary | ICD-10-CM | POA: Diagnosis present

## 2020-09-09 DIAGNOSIS — R112 Nausea with vomiting, unspecified: Secondary | ICD-10-CM | POA: Diagnosis not present

## 2020-09-09 DIAGNOSIS — C50912 Malignant neoplasm of unspecified site of left female breast: Secondary | ICD-10-CM | POA: Diagnosis not present

## 2020-09-09 DIAGNOSIS — Z170 Estrogen receptor positive status [ER+]: Secondary | ICD-10-CM

## 2020-09-09 DIAGNOSIS — D696 Thrombocytopenia, unspecified: Secondary | ICD-10-CM | POA: Diagnosis present

## 2020-09-09 DIAGNOSIS — C50919 Malignant neoplasm of unspecified site of unspecified female breast: Secondary | ICD-10-CM

## 2020-09-09 DIAGNOSIS — T451X5A Adverse effect of antineoplastic and immunosuppressive drugs, initial encounter: Secondary | ICD-10-CM | POA: Diagnosis not present

## 2020-09-09 DIAGNOSIS — E860 Dehydration: Secondary | ICD-10-CM | POA: Diagnosis not present

## 2020-09-09 DIAGNOSIS — K521 Toxic gastroenteritis and colitis: Secondary | ICD-10-CM | POA: Diagnosis not present

## 2020-09-09 DIAGNOSIS — F419 Anxiety disorder, unspecified: Secondary | ICD-10-CM | POA: Diagnosis present

## 2020-09-09 DIAGNOSIS — A6009 Herpesviral infection of other urogenital tract: Secondary | ICD-10-CM | POA: Diagnosis present

## 2020-09-09 DIAGNOSIS — N179 Acute kidney failure, unspecified: Principal | ICD-10-CM | POA: Diagnosis present

## 2020-09-09 DIAGNOSIS — F909 Attention-deficit hyperactivity disorder, unspecified type: Secondary | ICD-10-CM | POA: Diagnosis present

## 2020-09-09 DIAGNOSIS — Z20822 Contact with and (suspected) exposure to covid-19: Secondary | ICD-10-CM | POA: Diagnosis not present

## 2020-09-09 DIAGNOSIS — E871 Hypo-osmolality and hyponatremia: Secondary | ICD-10-CM | POA: Diagnosis not present

## 2020-09-09 DIAGNOSIS — C787 Secondary malignant neoplasm of liver and intrahepatic bile duct: Secondary | ICD-10-CM | POA: Diagnosis present

## 2020-09-09 DIAGNOSIS — D709 Neutropenia, unspecified: Secondary | ICD-10-CM | POA: Diagnosis present

## 2020-09-09 DIAGNOSIS — I4581 Long QT syndrome: Secondary | ICD-10-CM | POA: Diagnosis not present

## 2020-09-09 DIAGNOSIS — J45909 Unspecified asthma, uncomplicated: Secondary | ICD-10-CM | POA: Diagnosis not present

## 2020-09-09 DIAGNOSIS — K121 Other forms of stomatitis: Secondary | ICD-10-CM | POA: Diagnosis present

## 2020-09-09 DIAGNOSIS — E872 Acidosis: Secondary | ICD-10-CM | POA: Diagnosis present

## 2020-09-09 DIAGNOSIS — R197 Diarrhea, unspecified: Secondary | ICD-10-CM

## 2020-09-09 DIAGNOSIS — R9431 Abnormal electrocardiogram [ECG] [EKG]: Secondary | ICD-10-CM

## 2020-09-09 HISTORY — DX: Malignant (primary) neoplasm, unspecified: C80.1

## 2020-09-09 LAB — CBC WITH DIFFERENTIAL/PLATELET
Abs Immature Granulocytes: 0.03 10*3/uL (ref 0.00–0.07)
Basophils Absolute: 0 10*3/uL (ref 0.0–0.1)
Basophils Relative: 2 %
Eosinophils Absolute: 0 10*3/uL (ref 0.0–0.5)
Eosinophils Relative: 1 %
HCT: 43.8 % (ref 36.0–46.0)
Hemoglobin: 16.1 g/dL — ABNORMAL HIGH (ref 12.0–15.0)
Immature Granulocytes: 2 %
Lymphocytes Relative: 13 %
Lymphs Abs: 0.2 10*3/uL — ABNORMAL LOW (ref 0.7–4.0)
MCH: 30.2 pg (ref 26.0–34.0)
MCHC: 36.8 g/dL — ABNORMAL HIGH (ref 30.0–36.0)
MCV: 82.2 fL (ref 80.0–100.0)
Monocytes Absolute: 0.3 10*3/uL (ref 0.1–1.0)
Monocytes Relative: 25 %
Neutro Abs: 0.7 10*3/uL — ABNORMAL LOW (ref 1.7–7.7)
Neutrophils Relative %: 57 %
Platelets: 120 10*3/uL — ABNORMAL LOW (ref 150–400)
RBC: 5.33 MIL/uL — ABNORMAL HIGH (ref 3.87–5.11)
RDW: 11.5 % (ref 11.5–15.5)
WBC: 1.3 10*3/uL — CL (ref 4.0–10.5)
nRBC: 1.6 % — ABNORMAL HIGH (ref 0.0–0.2)

## 2020-09-09 LAB — COMPREHENSIVE METABOLIC PANEL
ALT: 29 U/L (ref 0–44)
AST: 19 U/L (ref 15–41)
Albumin: 3.2 g/dL — ABNORMAL LOW (ref 3.5–5.0)
Alkaline Phosphatase: 87 U/L (ref 38–126)
Anion gap: 20 — ABNORMAL HIGH (ref 5–15)
BUN: 36 mg/dL — ABNORMAL HIGH (ref 6–20)
CO2: 19 mmol/L — ABNORMAL LOW (ref 22–32)
Calcium: 7.6 mg/dL — ABNORMAL LOW (ref 8.9–10.3)
Chloride: 87 mmol/L — ABNORMAL LOW (ref 98–111)
Creatinine, Ser: 3.39 mg/dL — ABNORMAL HIGH (ref 0.44–1.00)
GFR, Estimated: 19 mL/min — ABNORMAL LOW (ref >60)
Glucose, Bld: 139 mg/dL — ABNORMAL HIGH (ref 70–99)
Potassium: 3.3 mmol/L — ABNORMAL LOW (ref 3.5–5.1)
Sodium: 126 mmol/L — ABNORMAL LOW (ref 135–145)
Total Bilirubin: 3.9 mg/dL — ABNORMAL HIGH (ref 0.3–1.2)
Total Protein: 7.2 g/dL (ref 6.5–8.1)

## 2020-09-09 LAB — URINALYSIS, ROUTINE W REFLEX MICROSCOPIC
Bilirubin Urine: NEGATIVE
Glucose, UA: NEGATIVE mg/dL
Ketones, ur: 20 mg/dL — AB
Leukocytes,Ua: NEGATIVE
Nitrite: NEGATIVE
Protein, ur: 30 mg/dL — AB
Specific Gravity, Urine: 1.016 (ref 1.005–1.030)
pH: 6 (ref 5.0–8.0)

## 2020-09-09 LAB — SAMPLE TO BLOOD BANK

## 2020-09-09 LAB — RESP PANEL BY RT-PCR (FLU A&B, COVID) ARPGX2
Influenza A by PCR: NEGATIVE
Influenza B by PCR: NEGATIVE
SARS Coronavirus 2 by RT PCR: NEGATIVE

## 2020-09-09 LAB — LIPASE, BLOOD: Lipase: 15 U/L (ref 11–51)

## 2020-09-09 LAB — MAGNESIUM: Magnesium: 2.3 mg/dL (ref 1.7–2.4)

## 2020-09-09 MED ORDER — SCOPOLAMINE 1 MG/3DAYS TD PT72
1.0000 | MEDICATED_PATCH | TRANSDERMAL | Status: DC
  Administered 2020-09-09 – 2020-09-12 (×2): 1.5 mg via TRANSDERMAL
  Filled 2020-09-09 (×3): qty 1

## 2020-09-09 MED ORDER — LIDOCAINE-PRILOCAINE 2.5-2.5 % EX CREA
TOPICAL_CREAM | Freq: Once | CUTANEOUS | Status: AC
  Filled 2020-09-09: qty 5

## 2020-09-09 MED ORDER — LACTATED RINGERS IV BOLUS
1000.0000 mL | Freq: Once | INTRAVENOUS | Status: AC
  Administered 2020-09-09: 1000 mL via INTRAVENOUS

## 2020-09-09 MED ORDER — LORAZEPAM 0.5 MG PO TABS
0.5000 mg | ORAL_TABLET | Freq: Once | ORAL | Status: AC
  Administered 2020-09-09: 0.5 mg via SUBLINGUAL
  Filled 2020-09-09: qty 1

## 2020-09-09 MED ORDER — PANTOPRAZOLE SODIUM 40 MG IV SOLR
40.0000 mg | INTRAVENOUS | Status: DC
  Administered 2020-09-09 – 2020-09-17 (×9): 40 mg via INTRAVENOUS
  Filled 2020-09-09 (×9): qty 40

## 2020-09-09 MED ORDER — SODIUM CHLORIDE 0.9 % IV SOLN: Freq: Once | INTRAVENOUS | Status: AC

## 2020-09-09 NOTE — ED Provider Notes (Signed)
[facility] DEPT Provider Note   CSN: [id number] Arrival date & time: 09/09/20  1311     History Chief Complaint  Patient presents with   CA Pt   Emesis   Abdominal Pain    ALOHI NILSSON is a 25 y.o. female.   Emesis Associated symptoms: abdominal pain and diarrhea   Associated symptoms: no arthralgias, no chills, no cough, no fever and no sore throat   Abdominal Pain Associated symptoms: diarrhea, nausea and vomiting   Associated symptoms: no chest pain, no chills, no cough, no dysuria, no fever, no hematuria, no shortness of breath and no sore throat      25 year old female with a history of metastatic breast cancer status postchemotherapy presenting to the emergency department with nausea, vomiting, severe dehydration, diarrhea.  The patient was sent to the emergency department by her oncologist due to these concerns.  She has had several days of nausea, vomiting, complete inability to tolerate any oral intake, diarrhea.  She feels significantly dehydrated.  She denies any fever.  On arrival, the patient was tachycardic to the 150s, appeared significantly dehydrated, actively experiencing nausea, vomiting and diarrhea.  Symptoms began 2 days ago after her chemotherapy but significantly worsened over the last 24 hours.   Past Medical History:  Diagnosis Date   Anxiety    Asthma    Exercise Induced   Cancer ([place])    Depression    Family history of pancreatic cancer    Family history of thyroid cancer    Herpes simplex     Patient Active Problem List   Diagnosis Date Noted   AKI (acute kidney injury) ([place]) 09/09/2020   Family history of thyroid cancer 08/29/2020   Family history of pancreatic cancer 08/29/2020   Carcinoma of breast metastatic to bone ([place]) 08/17/2020   Metastatic breast cancer ([place]) 08/16/2020   Left breast mass 08/13/2020   IUD (intrauterine device) in place 08/13/2020   Genital herpes 08/13/2020   Attention  deficit hyperactivity disorder (ADHD), 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   GAD (generalized anxiety disorder) 02/16/2017   Marijuana user 02/16/2017   Mild intermittent asthma without complication [id number]   Severe episode of recurrent major depressive disorder, without psychotic features ([place]) 02/16/2017   Depression 04/20/2012   Depo-Provera contraceptive status 04/20/2012    Past Surgical History:  Procedure Laterality Date   NO PAST SURGERIES     PORTACATH PLACEMENT Right 08/23/2020   Procedure: INSERTION PORT-A-CATH;  Surgeon: [name], MD;  Location: [facility];  Service: General;  Laterality: Right;     OB History     Gravida  0   Para  0   Term  0   Preterm  0   AB  0   Living  0      SAB  0   IAB  0   Ectopic  0   Multiple  0   Live Births  0           Family History  Problem Relation Age of Onset   Depression Mother    Hyperlipidemia Mother    Thyroid cancer Mother 3       papillary thyroid cancer   Hypertension Father    Atrial fibrillation Father    Irritable bowel syndrome Father        also brother   ADD / ADHD Brother    Bipolar disorder Brother    Diabetes Maternal Grandfather    Heart attack Paternal  Great-grandfather 32   Pancreatic cancer Maternal Great-grandfather 88       (MGM's father)    Social History   Tobacco Use   Smoking status: Never   Smokeless tobacco: Never  Vaping Use   Vaping Use: Every day   Substances: THC  Substance Use Topics   Alcohol use: Yes    Alcohol/week: 7.0 standard drinks    Types: 7 Glasses of wine per week   Drug use: Yes    Types: Marijuana    Home Medications Prior to Admission medications   Medication Sig Start Date End Date Taking? Authorizing Provider  acyclovir (ZOVIRAX) 800 MG tablet Take 800 mg by mouth 5 (five) times daily.    [provider]  amphetamine-dextroamphetamine (ADDERALL XR) 15 MG 24 hr capsule Take 15 mg by mouth daily.  Takes 15 mg on work days 07/02/18   [provider]  cholestyramine (QUESTRAN) 4 g packet Take 1 packet mixed with liquid by mouth 2 times daily as needed. 09/06/20   Gardenia Phlegm, NP  diphenoxylate-atropine (LOMOTIL) 2.5-0.025 MG tablet Take 1 tablet by mouth 4 times daily as needed for diarrhea or loose stools. 09/06/20   Gardenia Phlegm, NP  gabapentin (NEURONTIN) 100 MG capsule Take 100 mg by mouth 3 (three) times daily.    [provider]  levonorgestrel (KYLEENA) 19.5 MG IUD by Intrauterine route.    [provider]  lidocaine-prilocaine (EMLA) cream Apply to affected area once 08/22/20   [name], MD  LORazepam (ATIVAN) 0.5 MG tablet Take 1 tablet (0.5 mg total) by mouth at bedtime as needed for sleep. 08/22/20   [name], MD  Multiple Vitamin (MULTIVITAMINS PO) Take by mouth daily.    [provider]  ondansetron (ZOFRAN) 8 MG tablet Take 1 tablet (8 mg total) by mouth 2 (two) times daily as needed for refractory nausea / vomiting. 08/22/20   [name], MD  oxyCODONE-acetaminophen (PERCOCET/ROXICET) 5-325 MG tablet Take 1 tablet by mouth every 8 (eight) hours as needed for severe pain. 08/22/20   [name], MD  prochlorperazine (COMPAZINE) 10 MG tablet Take 1 tablet (10 mg total) by mouth every 6 (six) hours as needed for nausea or vomiting. 08/22/20   [name], MD    Allergies    Other  Review of Systems   Review of Systems  Constitutional:  Negative for chills and fever.  HENT:  Negative for ear pain and sore throat.   Eyes:  Negative for pain and visual disturbance.  Respiratory:  Negative for cough and shortness of breath.   Cardiovascular:  Negative for chest pain and palpitations.  Gastrointestinal:  Positive for abdominal pain, diarrhea, nausea and vomiting.  Genitourinary:  Negative for dysuria and hematuria.  Musculoskeletal:  Negative for arthralgias and back pain.  Skin:  Negative for color change and  rash.  Neurological:  Negative for seizures and syncope.  All other systems reviewed and are negative.  Physical Exam Updated Vital Signs BP (!) 134/96   Pulse (!) 127   Temp 98.6 F (37 C) (Oral)   Resp 19   LMP 08/18/2020 (Exact Date)   SpO2 100%   Physical Exam Vitals and nursing note reviewed.  Constitutional:      General: She is in acute distress.     Appearance: She is well-developed. She is ill-appearing.  HENT:     Head: Normocephalic and atraumatic.     Mouth/Throat:     Mouth: Mucous membranes are dry.  Eyes:     Conjunctiva/sclera: Conjunctivae normal.  Cardiovascular:     Rate and Rhythm: Regular rhythm. Tachycardia present.     Heart sounds: No murmur heard. Pulmonary:     Effort: Pulmonary effort is normal. No respiratory distress.     Breath sounds: Normal breath sounds.  Abdominal:     Palpations: Abdomen is soft.     Tenderness: There is no abdominal tenderness. There is no guarding or rebound.  Musculoskeletal:        General: No deformity or signs of injury.     Cervical back: Neck supple.  Skin:    General: Skin is warm and dry.     Capillary Refill: Capillary refill takes 2 to 3 seconds.  Neurological:     General: No focal deficit present.     Mental Status: She is alert. Mental status is at baseline.    ED Results / Procedures / Treatments   Labs (all labs ordered are listed, but only abnormal results are displayed) Labs Reviewed  COMPREHENSIVE METABOLIC PANEL - Abnormal; Notable for the following components:      Result Value   Sodium 126 (*)    Potassium 3.3 (*)    Chloride 87 (*)    CO2 19 (*)    Glucose, Bld 139 (*)    BUN 36 (*)    Creatinine, Ser 3.39 (*)    Calcium 7.6 (*)    Albumin 3.2 (*)    Total Bilirubin 3.9 (*)    GFR, Estimated 19 (*)    Anion gap 20 (*)    All other components within normal limits  CBC WITH DIFFERENTIAL/PLATELET - Abnormal; Notable for the following components:   WBC 1.3 (*)    RBC 5.33 (*)     Hemoglobin 16.1 (*)    MCHC 36.8 (*)    Platelets 120 (*)    nRBC 1.6 (*)    Neutro Abs 0.7 (*)    Lymphs Abs 0.2 (*)    All other components within normal limits  URINALYSIS, ROUTINE W REFLEX MICROSCOPIC - Abnormal; Notable for the following components:   Color, Urine AMBER (*)    APPearance HAZY (*)    Hgb urine dipstick MODERATE (*)    Ketones, ur 20 (*)    Protein, ur 30 (*)    Bacteria, UA RARE (*)    Non Squamous Epithelial 0-5 (*)    All other components within normal limits  RESP PANEL BY RT-PCR (FLU A&B, COVID) ARPGX2  LIPASE, BLOOD  MAGNESIUM  CBC  COMPREHENSIVE METABOLIC PANEL  HIV ANTIBODY (ROUTINE TESTING W REFLEX)  CBG MONITORING, ED  SAMPLE TO BLOOD BANK    EKG EKG Interpretation  Date/Time:  Sunday September 09 2020 13:56:51 EDT Ventricular Rate:  159 PR Interval:  126 QRS Duration: 159 QT Interval:  355 QTC Calculation: 578 R Axis:   93 Text Interpretation: Sinus tachycardia Consider right atrial enlargement Probable left ventricular hypertrophy Prolonged QT interval Baseline wander in lead(s) III aVF Confirmed by Regan Lemming ([id number]) on 09/09/2020 6:30:41 PM  Radiology No results found.  Procedures Procedures   Medications Ordered in ED Medications  scopolamine (TRANSDERM-SCOP) 1 MG/3DAYS 1.5 mg (1.5 mg Transdermal Patch Applied 09/09/20 1623)  pantoprazole (PROTONIX) injection 40 mg (40 mg Intravenous Given 09/09/20 1709)  0.9 %  sodium chloride infusion (has no administration in time range)  acetaminophen (TYLENOL) tablet 650 mg (has no administration in time range)    Or  acetaminophen (TYLENOL) suppository 650 mg (  has no administration in time range)  loperamide HCl (IMODIUM) 1 MG/7.5ML suspension 2 mg (has no administration in time range)  LORazepam (ATIVAN) injection 1 mg (has no administration in time range)  potassium chloride 10 mEq in 100 mL IVPB (has no administration in time range)  lactated ringers bolus 1,000 mL (0 mLs Intravenous Stopped  09/09/20 1937)  lidocaine-prilocaine (EMLA) cream ( Topical Given 09/09/20 1415)  LORazepam (ATIVAN) tablet 0.5 mg (0.5 mg Sublingual Given 09/09/20 1443)  LORazepam (ATIVAN) tablet 0.5 mg (0.5 mg Sublingual Given 09/09/20 1543)  lactated ringers bolus 1,000 mL (0 mLs Intravenous Stopped 09/09/20 1937)  0.9 %  sodium chloride infusion ( Intravenous New Bag/Given 09/09/20 1709)    ED Course  I have reviewed the triage vital signs and the nursing notes.  Pertinent labs & imaging results that were available during my care of the patient were reviewed by me and considered in my medical decision making (see chart for details).    MDM Rules/Calculators/A&P                           25 year old female with a history of metastatic breast cancer status postchemotherapy presenting to the [facility] apartment with severe dehydration, hypovolemia in the setting of chemotherapy-induced nausea and vomiting.  EKG performed which revealed prolonged QT and reveal in the setting of the patient's chemotherapy likely and antiemetics.  Sublingual Ativan administered for her symptoms.  I the patient arrived hemodynamically unstable, tachycardic to the 150s and 160s, borderline hypotensive and was administered 2 L IV fluid boluses on arrival.  Screening labs obtained concerning for an AKI with a creatinine greater than 3 from a baseline of normal.  Patient did have good response to volume resuscitation and improvement of her tachycardia from the 150s down to the 120s.  Given the patient's intractable nausea, vomiting, diarrhea, abdominal pain, hypovolemia requiring volume resuscitation with IV fluids, medicine was consulted for admission.  Spoke with the patient's oncologist who plans to round on the patient in the morning to provide inpatient recommendations.  Patient was found to be neutropenic on labs but is currently afebrile with no signs or symptoms of infection.  Will defer antibiotics to the inpatient team.  The patient  was subsequently admitted in stable condition.  Final Clinical Impression(s) / ED Diagnoses Final diagnoses:  Chemotherapy induced nausea and vomiting  Prolonged Q-T interval on ECG  Dehydration  AKI (acute kidney injury) [facility])    Rx / DC Orders ED Discharge Orders     None        Regan Lemming, MD 09/10/20 [phone]

## 2020-09-09 NOTE — Assessment & Plan Note (Deleted)
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

## 2020-09-09 NOTE — ED Triage Notes (Addendum)
Pt reports starting chemo x 5 days ago.  C/o diarrhea x 4 days and abdominal pain and emesis x 3 days.  Pain score 10/10.  Pt reports taking several prescribed medications w/o relief.  Hx of breast CA.

## 2020-09-09 NOTE — ED Notes (Signed)
Placed pt on inpatient bed..the pt ambulatory to restroom with mom assistance

## 2020-09-09 NOTE — H&P (Signed)
History and Physical    Ellen [name] [id number] DOB: 05-24-1995 DOA: 09/09/2020  PCP: Pcp, No  Patient coming from: Home  Chief Complaint: D/N/V  HPI: Ellen [name] is a 25 y.o. female with medical history significant of carcinoma of the left breast, ADHD, anxiety. Presenting with diarrhea, N/V. She has recently completed a 10-day stretch of XRT. During this time, she developed diarrhea intermittently. It became persistent about 4 days ago. She reports that also during this time, she's had 2 rounds of chemo -- her most recent being about 2 days ago. These sessions have led to nausea and poor PO intake. She had has significant N/V over the last 24 hours. She is no longer able to keep anything down. She spoke with her cancer team today and they recommended that she come to the ED for help.   ED Course: She was found to be in sinus tach to 160. She was given a liter of fluids and her HR improved to 130s. She was found to be in AKI. She was found to be neutropenic, but afebrile. Her Qtc was long. She was given fluids, scopolamine, and ativan. TRH was called for admission.   Review of Systems:  Denies CP, dyspnea, lightheadedness, dizziness, fevers, sick contacts, hematochezia, hematemesis. Review of systems is otherwise negative for all not mentioned in HPI.   PMHx Past Medical History:  Diagnosis Date   Anxiety    Asthma    Exercise Induced   Cancer ([place])    Depression    Family history of pancreatic cancer    Family history of thyroid cancer    Herpes simplex     PSHx Past Surgical History:  Procedure Laterality Date   NO PAST SURGERIES     PORTACATH PLACEMENT Right 08/23/2020   Procedure: INSERTION PORT-A-CATH;  Surgeon: [name], MD;  Location: [facility];  Service: General;  Laterality: Right;    SocHx  reports that she has never smoked. She has never used smokeless tobacco. She reports current alcohol use of about 7.0 standard drinks of  alcohol per week. She reports current drug use. Drug: Marijuana.  Allergies  Allergen Reactions   Other Other (See Comments)    Pt states that she is allergic to the fabric adhesives; It tears her skin.    FamHx Family History  Problem Relation Age of Onset   Depression Mother    Hyperlipidemia Mother    Thyroid cancer Mother 1       papillary thyroid cancer   Hypertension Father    Atrial fibrillation Father    Irritable bowel syndrome Father        also brother   ADD / ADHD Brother    Bipolar disorder Brother    Diabetes Maternal Grandfather    Heart attack Paternal Great-grandfather 82   Pancreatic cancer Maternal Great-grandfather 88       (MGM's father)    Prior to Admission medications   Medication Sig Start Date End Date Taking? Authorizing Provider  acyclovir (ZOVIRAX) 800 MG tablet Take 800 mg by mouth 5 (five) times daily.    [provider]  amphetamine-dextroamphetamine (ADDERALL XR) 15 MG 24 hr capsule Take 15 mg by mouth daily. Takes 15 mg on work days 07/02/18   [provider]  cholestyramine (QUESTRAN) 4 g packet Take 1 packet mixed with liquid by mouth 2 times daily as needed. 09/06/20   Gardenia Phlegm, NP  diphenoxylate-atropine (LOMOTIL) 2.5-0.025 MG tablet Take 1 tablet  by mouth 4 times daily as needed for diarrhea or loose stools. 09/06/20   Gardenia Phlegm, NP  gabapentin (NEURONTIN) 100 MG capsule Take 100 mg by mouth 3 (three) times daily.    [provider]  levonorgestrel (KYLEENA) 19.5 MG IUD by Intrauterine route.    [provider]  lidocaine-prilocaine (EMLA) cream Apply to affected area once 08/22/20   [name], MD  LORazepam (ATIVAN) 0.5 MG tablet Take 1 tablet (0.5 mg total) by mouth at bedtime as needed for sleep. 08/22/20   [name], MD  Multiple Vitamin (MULTIVITAMINS PO) Take by mouth daily.    [provider]  ondansetron (ZOFRAN) 8 MG tablet Take 1 tablet (8 mg total) by  mouth 2 (two) times daily as needed for refractory nausea / vomiting. 08/22/20   [name], MD  oxyCODONE-acetaminophen (PERCOCET/ROXICET) 5-325 MG tablet Take 1 tablet by mouth every 8 (eight) hours as needed for severe pain. 08/22/20   [name], MD  prochlorperazine (COMPAZINE) 10 MG tablet Take 1 tablet (10 mg total) by mouth every 6 (six) hours as needed for nausea or vomiting. 08/22/20   [name], MD    Physical Exam: Vitals:   09/09/20 1515 09/09/20 1530 09/09/20 1536 09/09/20 1538  BP: (!) 121/97 120/82 120/82   Pulse: (!) 131 (!) 131 (!) 131   Resp:   16   Temp:    98.6 F (37 C)  TempSrc:    Oral  SpO2: 100% 100% 99%     General: 25 y.o. female resting in bed in NAD Eyes: PERRL, normal sclera ENMT: Nares patent w/o discharge, orophaynx clear, dentition normal, ears w/o discharge/lesions/ulcers Neck: Supple, trachea midline Cardiovascular: tachy, +S1, S2, no m/g/r, equal pulses throughout Respiratory: CTABL, no w/r/r, normal WOB GI: BS+, ND, global mild TTP, no masses noted, no organomegaly noted MSK: No e/c/c Skin: No rashes, bruises, ulcerations noted Neuro: A&O x 3, no focal deficits Psyc: Appropriate interaction and affect, calm/cooperative  Labs on Admission: I have personally reviewed following labs and imaging studies  CBC: Recent Labs  Lab 09/04/20 0842 09/09/20 1438  WBC 5.3 1.3*  NEUTROABS 3.3 0.7*  HGB 13.2 16.1*  HCT 38.8 43.8  MCV 88.2 82.2  PLT 170 [id number]*   Basic Metabolic Panel: Recent Labs  Lab 09/04/20 0842 09/09/20 1438  NA 137 126*  K 4.0 3.3*  CL 106 87*  CO2 23 19*  GLUCOSE 93 139*  BUN 13 36*  CREATININE 0.72 3.39*  CALCIUM 9.0 7.6*  MG  --  2.3   GFR: Estimated Creatinine Clearance: 22.8 mL/min (A) (by C-G formula based on SCr of 3.39 mg/dL (H)). Liver Function Tests: Recent Labs  Lab 09/04/20 0842 09/09/20 1438  AST 19 19  ALT 36 29  ALKPHOS 113 87  BILITOT 0.6 3.9*  PROT 7.1 7.2  ALBUMIN 3.6 3.2*   Recent  Labs  Lab 09/09/20 1438  LIPASE 15   No results for input(s): AMMONIA in the last 168 hours. Coagulation Profile: No results for input(s): INR, PROTIME in the last 168 hours. Cardiac Enzymes: No results for input(s): CKTOTAL, CKMB, CKMBINDEX, TROPONINI in the last 168 hours. BNP (last 3 results) No results for input(s): PROBNP in the last 8760 hours. HbA1C: No results for input(s): HGBA1C in the last 72 hours. CBG: No results for input(s): GLUCAP in the last 168 hours. Lipid Profile: No results for input(s): CHOL, HDL, LDLCALC, TRIG, CHOLHDL, LDLDIRECT in the last 72 hours. Thyroid Function Tests: No  results for input(s): TSH, T4TOTAL, FREET4, T3FREE, THYROIDAB in the last 72 hours. Anemia Panel: No results for input(s): VITAMINB12, FOLATE, FERRITIN, TIBC, IRON, RETICCTPCT in the last 72 hours. Urine analysis:    Component Value Date/Time   COLORURINE AMBER (A) 09/04/2020 0951   APPEARANCEUR CLEAR 09/04/2020 0951   LABSPEC 1.004 (L) 09/04/2020 0951   PHURINE 6.0 09/04/2020 0951   GLUCOSEU NEGATIVE 09/04/2020 0951   HGBUR NEGATIVE 09/04/2020 0951   BILIRUBINUR NEGATIVE 09/04/2020 0951   BILIRUBINUR neg 10/31/2011 0945   KETONESUR NEGATIVE 09/04/2020 0951   PROTEINUR NEGATIVE 09/04/2020 0951   UROBILINOGEN 0.2 01/08/2014 1757   NITRITE POSITIVE (A) 09/04/2020 0951   LEUKOCYTESUR NEGATIVE 09/04/2020 0951    Radiological Exams on Admission: No results found.  EKG: Independently reviewed. Sinus tach, no st elevations  Assessment/Plan Intractable N/V/D Abdominal pain AKI High anion gap metabolic acidosis     - admit to inpt, tele     - she recently had a 10-day span of XRT w/ 2 rounds of chemo; during her XRT she developed diarrhea and nausea started after her last chemo session     - she has had poor PO intake during this time, which has acutely worsened in the last couple of days     - her abdominal pain is global and she endorses retching w/ her nausea     - no  evidence of infection, she's afebrile, no recent abx use, no sick contacts     - Scr is 3.39 at admission, her baseline is 0.7 - 0.8     - fluids, scopolamine, ativan, protonix for now     - CLD is ordered     - imodium  Hx of breast cancer on XRT/chemo     - follows w/ Dr. Lindi Adie; onco is aware of her admission and will follow  Hypokalemia     - Mg2+ is ok, replace K+  Prolonged Qt     - avoid Qt prolonging medications  Leukopenia Thrombocytopenia Neutropenia     - afebrile, no evidence of infection     - all secondary to recent radiation/chemo     - onco following, defer further intervention to them  DVT prophylaxis: SCDs  Code Status: FULL  Family Communication: w/ mother at bedside  Consults called: Onco called by EDP   Status is: Inpatient  Remains inpatient appropriate because:Inpatient level of care appropriate due to severity of illness  Dispo: The patient is from: Home              Anticipated d/c is to: Home              Patient currently is not medically stable to d/c.   Difficult to place patient No  Time spent coordinating admission: 60 minutes  [facility]  If 7PM-7AM, please contact night-coverage [email or web address]  09/09/2020, 3:52 PM

## 2020-09-10 ENCOUNTER — Inpatient Hospital Stay: Payer: Medicaid Other

## 2020-09-10 ENCOUNTER — Inpatient Hospital Stay (HOSPITAL_COMMUNITY): Payer: Medicaid Other

## 2020-09-10 ENCOUNTER — Inpatient Hospital Stay: Payer: Medicaid Other | Admitting: Adult Health

## 2020-09-10 ENCOUNTER — Encounter: Payer: Self-pay | Admitting: *Deleted

## 2020-09-10 DIAGNOSIS — C50919 Malignant neoplasm of unspecified site of unspecified female breast: Secondary | ICD-10-CM

## 2020-09-10 LAB — COMPREHENSIVE METABOLIC PANEL
ALT: 22 U/L (ref 0–44)
AST: 16 U/L (ref 15–41)
Albumin: 2.2 g/dL — ABNORMAL LOW (ref 3.5–5.0)
Alkaline Phosphatase: 69 U/L (ref 38–126)
Anion gap: 11 (ref 5–15)
BUN: 19 mg/dL (ref 6–20)
CO2: 20 mmol/L — ABNORMAL LOW (ref 22–32)
Calcium: 7.4 mg/dL — ABNORMAL LOW (ref 8.9–10.3)
Chloride: 93 mmol/L — ABNORMAL LOW (ref 98–111)
Creatinine, Ser: 0.92 mg/dL (ref 0.44–1.00)
GFR, Estimated: >60 mL/min (ref >60)
Glucose, Bld: 93 mg/dL (ref 70–99)
Potassium: 3.6 mmol/L (ref 3.5–5.1)
Sodium: 124 mmol/L — ABNORMAL LOW (ref 135–145)
Total Bilirubin: 4.2 mg/dL — ABNORMAL HIGH (ref 0.3–1.2)
Total Protein: 5.4 g/dL — ABNORMAL LOW (ref 6.5–8.1)

## 2020-09-10 LAB — CBC
HCT: 33.6 % — ABNORMAL LOW (ref 36.0–46.0)
Hemoglobin: 12.1 g/dL (ref 12.0–15.0)
MCH: 30 pg (ref 26.0–34.0)
MCHC: 36 g/dL (ref 30.0–36.0)
MCV: 83.4 fL (ref 80.0–100.0)
Platelets: 99 10*3/uL — ABNORMAL LOW (ref 150–400)
RBC: 4.03 MIL/uL (ref 3.87–5.11)
RDW: 11.7 % (ref 11.5–15.5)
WBC: 1.6 10*3/uL — ABNORMAL LOW (ref 4.0–10.5)
nRBC: 0 % (ref 0.0–0.2)

## 2020-09-10 LAB — PREGNANCY, URINE: Preg Test, Ur: NEGATIVE

## 2020-09-10 LAB — C DIFFICILE QUICK SCREEN W PCR REFLEX
C Diff antigen: NEGATIVE
C Diff interpretation: NOT DETECTED
C Diff toxin: NEGATIVE

## 2020-09-10 LAB — HIV ANTIBODY (ROUTINE TESTING W REFLEX): HIV Screen 4th Generation wRfx: NONREACTIVE

## 2020-09-10 MED ORDER — ACETAMINOPHEN 650 MG RE SUPP: 650.0000 mg | Freq: Four times a day (QID) | RECTAL | Status: DC | PRN

## 2020-09-10 MED ORDER — ALUM & MAG HYDROXIDE-SIMETH 200-200-20 MG/5ML PO SUSP
15.0000 mL | Freq: Four times a day (QID) | ORAL | Status: DC | PRN
  Administered 2020-09-10 – 2020-09-14 (×5): 15 mL via ORAL
  Filled 2020-09-10 (×5): qty 30

## 2020-09-10 MED ORDER — CHLORHEXIDINE GLUCONATE CLOTH 2 % EX PADS
6.0000 | MEDICATED_PAD | Freq: Every day | CUTANEOUS | Status: DC
  Administered 2020-09-10 – 2020-09-19 (×10): 6 via TOPICAL

## 2020-09-10 MED ORDER — SODIUM CHLORIDE 0.9% FLUSH
10.0000 mL | INTRAVENOUS | Status: DC | PRN
  Administered 2020-09-12: 10 mL

## 2020-09-10 MED ORDER — LACTATED RINGERS IV BOLUS
1000.0000 mL | Freq: Once | INTRAVENOUS | Status: AC
  Administered 2020-09-10: 1000 mL via INTRAVENOUS

## 2020-09-10 MED ORDER — BOOST / RESOURCE BREEZE PO LIQD CUSTOM
1.0000 | Freq: Three times a day (TID) | ORAL | Status: DC
  Administered 2020-09-10: 1 via ORAL

## 2020-09-10 MED ORDER — LORAZEPAM 2 MG/ML IJ SOLN
1.0000 mg | Freq: Four times a day (QID) | INTRAMUSCULAR | Status: DC | PRN
  Administered 2020-09-11: 1 mg via INTRAVENOUS
  Filled 2020-09-10: qty 1

## 2020-09-10 MED ORDER — OCTREOTIDE ACETATE 50 MCG/ML IJ SOLN
100.0000 ug | Freq: Two times a day (BID) | INTRAMUSCULAR | Status: DC
  Administered 2020-09-10 – 2020-09-13 (×7): 100 ug via SUBCUTANEOUS
  Filled 2020-09-10 (×8): qty 2

## 2020-09-10 MED ORDER — IOHEXOL 350 MG/ML SOLN
80.0000 mL | Freq: Once | INTRAVENOUS | Status: AC | PRN
  Administered 2020-09-10: 80 mL via INTRAVENOUS

## 2020-09-10 MED ORDER — LOPERAMIDE HCL 1 MG/7.5ML PO SUSP
2.0000 mg | ORAL | Status: DC | PRN
  Administered 2020-09-10 – 2020-09-17 (×18): 2 mg via ORAL
  Filled 2020-09-10 (×24): qty 15

## 2020-09-10 MED ORDER — ACETAMINOPHEN 325 MG PO TABS
650.0000 mg | ORAL_TABLET | Freq: Four times a day (QID) | ORAL | Status: DC | PRN
  Administered 2020-09-10 – 2020-09-12 (×2): 650 mg via ORAL
  Filled 2020-09-10 (×2): qty 2

## 2020-09-10 MED ORDER — POTASSIUM CHLORIDE 10 MEQ/100ML IV SOLN
10.0000 meq | INTRAVENOUS | Status: AC
  Administered 2020-09-10 (×4): 10 meq via INTRAVENOUS
  Filled 2020-09-10 (×4): qty 100

## 2020-09-10 MED ORDER — SODIUM CHLORIDE 0.9 % IV SOLN: INTRAVENOUS | Status: DC

## 2020-09-10 NOTE — Plan of Care (Signed)
  Problem: Education: Goal: Knowledge of General Education information will improve Description: Including pain rating scale, medication(s)/side effects and non-pharmacologic comfort measures Outcome: Progressing   Problem: Clinical Measurements: Goal: Ability to maintain clinical measurements within normal limits will improve Outcome: Progressing Goal: Will remain free from infection Outcome: Progressing Goal: Diagnostic test results will improve Outcome: Progressing Goal: Respiratory complications will improve Outcome: Progressing Goal: Cardiovascular complication will be avoided Outcome: Progressing   Problem: Activity: Goal: Risk for activity intolerance will decrease Outcome: Progressing   Problem: Nutrition: Goal: Adequate nutrition will be maintained Outcome: Progressing   Problem: Safety: Goal: Ability to remain free from injury will improve Outcome: Progressing   Problem: Skin Integrity: Goal: Risk for impaired skin integrity will decrease Outcome: Progressing   

## 2020-09-10 NOTE — Progress Notes (Signed)
Initial Nutrition Assessment  INTERVENTION:   -Boost Breeze po TID, each supplement provides 250 kcal and 9 grams of protein  NUTRITION DIAGNOSIS:   Increased nutrient needs related to cancer and cancer related treatments as evidenced by estimated needs.  GOAL:   Patient will meet greater than or equal to 90% of their needs  MONITOR:   PO intake, Supplement acceptance, Labs, Weight trends, I & O's, Diet advancement  REASON FOR ASSESSMENT:   Malnutrition Screening Tool    ASSESSMENT:   25 y.o. female with medical history significant of carcinoma of the left breast, ADHD, anxiety. Presenting with diarrhea, N/V. She has recently completed a 10-day stretch of XRT. During this time, she developed diarrhea intermittently. It became persistent about 4 days ago. She reports that also during this time, she's had 2 rounds of chemo -- her most recent being about 2 days ago. These sessions have led to nausea and poor PO intake. She had has significant N/V over the last 24 hours. She is no longer able to keep anything down.  Patient reporting 4 days of N/V and diarrhea. Currently on clear liquids. Recently completed XRT and last chemotherapy was 7/19 for breast cancer. Will order Boost Breeze while on clears.   Per weight records, no significant weight changes noted.  Medications: IV KCl  Labs reviewed: Low Na   NUTRITION - FOCUSED PHYSICAL EXAM:  Deferred.  Diet Order:   Diet Order             Diet clear liquid Room service appropriate? Yes; Fluid consistency: Thin  Diet effective now                   EDUCATION NEEDS:   Not appropriate for education at this time  Skin:  Skin Assessment: Reviewed RN Assessment  Last BM:  PTA  Height:   Ht Readings from Last 1 Encounters:  09/04/20 '5\' 5"'$  (1.651 m)    Weight:   Wt Readings from Last 1 Encounters:  09/04/20 63.2 kg    BMI:  23.1 kg/m^2  Estimated Nutritional Needs:   Kcal:  1900-2100  Protein:   95-105g  Fluid:  >2L/day  Clayton Bibles, MS, RD, LDN Inpatient Clinical Dietitian Contact information available via Amion

## 2020-09-10 NOTE — ED Notes (Signed)
Pt given sprite, water, ice chips, and icy per request.  Pt a&ox4, NADN, mother at bedside.

## 2020-09-10 NOTE — Progress Notes (Signed)
Pt arrived to unit room 1512. AOX4. Mother at bedside. Oriented to room and callbell with no complications. 2RN assessment and initial assessment has been completed. Yellow MEWS initiated due to increased HR of 117.  Attending notified (X. Blount) no new orders at this time.will continue to monitor

## 2020-09-10 NOTE — Progress Notes (Signed)
[facility]  [phone] [phone]     ID: Ellen [name] DOB: 21-Mar-1995  MR#: [id number]  LPN#:[id number]  Patient Care Team: Pcp, No as PCP - General [name], MD as Consulting Physician (Hematology and Oncology) Delice Bison, [name], NP as Nurse Practitioner (Hematology and Oncology) [name], MD as Consulting Physician (General Surgery) Mauro Kaufmann, RN as Oncology Nurse Navigator Rockwell Germany, RN as Oncology Nurse Navigator Scot Dock, NP OTHER MD:  CHIEF COMPLAINT: Dehydration/Nausea/Vomiting  HPI: Ellen [name] is a 25 year old patient who is cycle 1 day 12 of Trastuzumab/Pertuzumab and cycle 1 day 8 of chemotherapy treatment with Docetaxel.  She developed diarrhea after receiving the Pertuzumab and nausea/vomiting after the docetaxel.  She was admitted with dehydration yesterday with AKI and creatinine of 3.39.  Today her creatinine has improved.  She is feeling improved.  She continues to have diarrhea and difficulty making it to the bathroom in time.  She also continues to have difficulty keeping more than a couple of sips of fluids down without retching.  Her vitas overnight remained stable, and her tachycardia is improved--was 160 in the ER and 110 this morning.    REVIEW OF SYSTEMS:  Review of Systems  Constitutional:  Positive for appetite change and fatigue. Negative for fever.  HENT:   Positive for sore throat (says throat feels raw from vomiting). Negative for hearing loss, lump/mass and mouth sores.   Eyes:  Negative for eye problems and icterus.  Respiratory:  Negative for chest tightness, cough, shortness of breath and wheezing.   Cardiovascular:  Negative for chest pain, leg swelling and palpitations.  Gastrointestinal:  Positive for abdominal pain (cramping, not acute increased pain), diarrhea and nausea. Negative for abdominal distention, blood in stool, constipation, rectal pain and vomiting.   Endocrine: Negative for hot flashes.  Genitourinary:  Negative for difficulty urinating.   Musculoskeletal:  Negative for arthralgias and back pain.  Skin:  Negative for itching and rash.  Neurological:  Negative for dizziness, extremity weakness, headaches, light-headedness and numbness.  Hematological:  Negative for adenopathy. Does not bruise/bleed easily.  Psychiatric/Behavioral:  Negative for depression.     PAST MEDICAL HISTORY: Past Medical History:  Diagnosis Date   Anxiety    Asthma    Exercise Induced   Cancer ([place])    Depression    Family history of pancreatic cancer    Family history of thyroid cancer    Herpes simplex     PAST SURGICAL HISTORY: Past Surgical History:  Procedure Laterality Date   NO PAST SURGERIES     PORTACATH PLACEMENT Right 08/23/2020   Procedure: INSERTION PORT-A-CATH;  Surgeon: [name], MD;  Location: [facility];  Service: General;  Laterality: Right;    FAMILY HISTORY Family History  Problem Relation Age of Onset   Depression Mother    Hyperlipidemia Mother    Thyroid cancer Mother 50       papillary thyroid cancer   Hypertension Father    Atrial fibrillation Father    Irritable bowel syndrome Father        also brother   ADD / ADHD Brother    Bipolar disorder Brother    Diabetes Maternal Grandfather    Heart attack Paternal Great-grandfather 51   Pancreatic cancer Maternal Great-grandfather 88       (MGM's father)    SOCIAL HISTORY: Patient lives at home with her mom in [place], [place]. 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  Her significant other, Renato Battles is staying with her.       ADVANCED DIRECTIVES: HCPOA is her mom, [employer]   HEALTH MAINTENANCE: Social History   Tobacco Use   Smoking status: Never   Smokeless tobacco: Never  Vaping Use   Vaping Use: Every day   Substances: THC  Substance Use Topics   Alcohol use: Yes    Alcohol/week: 7.0 standard  drinks    Types: 7 Glasses of wine per week   Drug use: Yes    Types: Marijuana      Allergies  Allergen Reactions   Other Other (See Comments)    Pt states that she is allergic to the fabric adhesives; It tears her skin.    Current Facility-Administered Medications  Medication Dose Route Frequency Provider Last Rate Last Admin   0.9 %  sodium chloride infusion   Intravenous Continuous Marylyn Ishihara, Tyrone A, DO 150 mL/hr at 09/10/20 0105 New Bag at 09/10/20 0105   acetaminophen (TYLENOL) tablet 650 mg  650 mg Oral Q6H PRN Cherylann Ratel A, DO   650 mg at 09/10/20 0034   Or   acetaminophen (TYLENOL) suppository 650 mg  650 mg Rectal Q6H PRN Marylyn Ishihara, Tyrone A, DO       Chlorhexidine Gluconate Cloth 2 % PADS 6 each  6 each Topical Daily Jesusita Oka, RN   6 each at 09/10/20 1153   loperamide HCl (IMODIUM) 1 MG/7.5ML suspension 2 mg  2 mg Oral PRN Marylyn Ishihara, Tyrone A, DO       LORazepam (ATIVAN) injection 1 mg  1 mg Intravenous Q6H PRN Marylyn Ishihara, Tyrone A, DO       octreotide (SANDOSTATIN) injection 100 mcg  100 mcg Subcutaneous Q12H Camellia Popescu, [name], NP       pantoprazole (PROTONIX) injection 40 mg  40 mg Intravenous Q24H Kyle, Tyrone A, DO   40 mg at 09/09/20 1709   scopolamine (TRANSDERM-SCOP) 1 MG/3DAYS 1.5 mg  1 patch Transdermal Q72H Kyle, Tyrone A, DO   1.5 mg at 09/09/20 1623    OBJECTIVE:  Vitals:   09/10/20 0800 09/10/20 0959  BP: (!) 135/94 (!) 132/93  Pulse: 99 (!) 105  Resp:    Temp: 98.4 F (36.9 C) 98.9 F (37.2 C)  SpO2: 99% 100%     There is no height or weight on file to calculate BMI.   Wt Readings from Last 3 Encounters:  09/04/20 139 lb 4.8 oz (63.2 kg)  08/31/20 141 lb 12 oz (64.3 kg)  08/23/20 142 lb 6.7 oz (64.6 kg)      ECOG FS:2 - Symptomatic, <50% confined to bed  Ocular: Sclerae unicteric, pupils round and equal Ear-nose-throat: Oropharynx clear and moist Lymphatic: No cervical or supraclavicular adenopathy Lungs no rales or rhonchi Heart regular  rate and rhythm Abd soft, nontender, positive bowel sounds MSK no focal spinal tenderness, no joint edema Neuro: non-focal, well-oriented, appropriate affect    LAB RESULTS:  CMP     Component Value Date/Time   NA 124 (L) 09/10/2020 0503   K 3.6 09/10/2020 0503   CL 93 (L) 09/10/2020 0503   CO2 20 (L) 09/10/2020 0503   GLUCOSE 93 09/10/2020 0503   BUN 19 09/10/2020 0503   CREATININE 0.92 09/10/2020 0503   CREATININE 0.72 09/04/2020 0842   CALCIUM 7.4 (L) 09/10/2020 0503   PROT 5.4 (L) 09/10/2020 0503   ALBUMIN 2.2 (L) 09/10/2020 0503   AST 16 09/10/2020 0503  AST 19 09/04/2020 0842   ALT 22 09/10/2020 0503   ALT 36 09/04/2020 0842   ALKPHOS 69 09/10/2020 0503   BILITOT 4.2 (H) 09/10/2020 0503   BILITOT 0.6 09/04/2020 0842   GFRNONAA >60 09/10/2020 0503   GFRNONAA >60 09/04/2020 0842    Lab Results  Component Value Date   WBC 1.6 (L) 09/10/2020   NEUTROABS 0.7 (L) 09/09/2020   HGB 12.1 09/10/2020   HCT 33.6 (L) 09/10/2020   MCV 83.4 09/10/2020   PLT 99 (L) 09/10/2020  Urinalysis    Component Value Date/Time   COLORURINE AMBER (A) 09/09/2020 2051   APPEARANCEUR HAZY (A) 09/09/2020 2051   LABSPEC 1.016 09/09/2020 2051   PHURINE 6.0 09/09/2020 2051   GLUCOSEU NEGATIVE 09/09/2020 2051   HGBUR MODERATE (A) 09/09/2020 2051   BILIRUBINUR NEGATIVE 09/09/2020 2051   BILIRUBINUR neg 10/31/2011 0945   KETONESUR 20 (A) 09/09/2020 2051   PROTEINUR 30 (A) 09/09/2020 2051   UROBILINOGEN 0.2 01/08/2014 1757   NITRITE NEGATIVE 09/09/2020 2051   LEUKOCYTESUR NEGATIVE 09/09/2020 2051     STUDIES: Renal ultrasound normal  ELIGIBLE FOR AVAILABLE RESEARCH PROTOCOL:   ASSESSMENT: 25 y.o. with stage IV invasive ductal carcinoma, ER+, PR+, HER-2+, diagnosed in 08/2020 with liver and bone involvement.   1. Palliative radiation to lumbar metastases from 08/23/2020-08/31/2020   2. Trastuzumab/Pertuzumab given on 08/30/2020; Docetaxel started on 09/04/2020   (A) Significant  diarrhea, nausea/vomiting, leading to dehydration and admission on 09/09/2020  PLAN:  Ellen [name] is admitted for AKI related to her dehydration from significant nausea, vomiting, diarrhea, and dehydration from her metastatic breast cancer treatment.    AKI: improved with fluids, renal ultrasound negative, will continue with hydration, NS 14m/hr  2. Diarrhea: This is persistent.  Will send C diff and then start Octreotide q12 H.   3. Nausea/vomiting: receiving IV fluids and anti emetics.  Scopalimine patch in place behind right ear. Holding Zofran due to prolonged QT.  Advancing diet as tolerated.  Protonix IV every 24 hours. Has not received PRN anti-emetics overnight  4. Hypokalemia: K 3.6 today after replacement overnight.  Dispo: D/c once able to keep down oral intake, and diarrhea is controlled  LOS: 1 CODE: FULL PPX: SCDs, Protonix   Thank you hospitalists for your wonderful care of this patient!  LWilber Bihari NP 09/10/20 12:42 PM Medical Oncology and Hematology [facility] [place] [id number]. [phone]   Fax. [phone]

## 2020-09-10 NOTE — ED Notes (Addendum)
Pt HR 130's, on arrival to ED 160's.  Pt has had 2 LR boluses and maintenance fluids running.  Pt in NAD.  X.Blount,NP aware.  Additional LR bolus ordered.

## 2020-09-10 NOTE — Progress Notes (Signed)
I spoke with the patient's mother, Ellen [name] earlier tonight.  Ellen [name] has continued to have difficulty with oral intake, nausea, and vomiting.  I placed an order for CT A/P, and added CBC with diff to her lab testing tomorrow to re-check her [place] [facility].    Wilber Bihari, NP

## 2020-09-10 NOTE — Progress Notes (Signed)
PROGRESS NOTE    Ellen [name]  [id number] DOB: 09-12-1995 DOA: 09/09/2020 PCP: Pcp, No   Brief Narrative:  This 25 years old female with PMH significant for carcinoma of left breast, ADHD, anxiety presented to the ED with complaints of persistent nausea, vomiting and diarrhea.  Patient has recently completed 10-day course of chemotherapy.  During this time she has developed diarrhea intermittently, it became persistent for last 4 days. Patient also reported during this time, she had 2 rounds of chemo with the most recent being 2 days ago. She has  developed persistent nausea and vomiting and decreased p.o. intake.  She is unable to keep anything down. She was found to be in sinus tachycardia, she was given IV fluids which improved the heart rate. Serum creatinine found to be 3.36 which is improved with IV hydration.  Patient is found to be neutropenic but afebrile.  Assessment & Plan:   Active Problems:   AKI (acute kidney injury) ([place])  Intractable nausea and vomiting and diarrhea in the setting of chemotherapy for left breast cancer: Patient recently has completed 10-day course of chemotherapy. She has developed persistent nausea, vomiting and diarrhea during that course. She is found to have high anion gap metabolic acidosis, acute kidney injury.  She reports worsening abdominal pain There are no signs of infection noted,  she is afebrile denies any recent antibiotic use, denies any sick contacts. Continue IV fluids, scopolamine, Ativan and Protonix as needed. Started on clear liquid diet, will advance as tolerated. Obtain stool for C. difficile colitis , continue Imodium as needed.  AKI secondary to dehydration: Serum creatinine 3.39 at admission,  baseline creatinine 0.7-0.8 AKI resolved with IV hydration.  Serum creatinine back to baseline  Hypokalemia: Replaced continue to monitor  Prolonged QT interval: Avoid Zofran and Reglan. EKG in the morning.  History of  breast cancer on chemo: Heme-onc consulted recommended to continue supportive care.   Leukopenia/ thrombocytopenia/ neutropenia: Continue neutropenic precautions, no evidence of infection.    DVT prophylaxis: SCDs Code Status: Full code Family Communication: Mother at  bedside Disposition Plan:   Status is: Inpatient  Remains inpatient appropriate because:Inpatient level of care appropriate due to severity of illness  Dispo: The patient is from: Home              Anticipated d/c is to: Home              Patient currently is not medically stable to d/c.   Difficult to place patient No  Consultants:  Hematology  Procedures:  None Antimicrobials: None   Subjective: Patient was seen and examined at bedside.  Overnight events noted.   She continues to feel nauseous,  states she has tolerated clear liquid diet. Reports abdominal pain is better  Objective: Vitals:   09/10/20 0611 09/10/20 0800 09/10/20 0959 09/10/20 1409  BP: 128/86 (!) 135/94 (!) 132/93 (!) 145/94  Pulse: (!) 110 99 (!) 105 97  Resp: 16   18  Temp: 99.1 F (37.3 C) 98.4 F (36.9 C) 98.9 F (37.2 C) 98.4 F (36.9 C)  TempSrc: Oral Oral Oral Oral  SpO2: 99% 99% 100% 99%    Intake/Output Summary (Last 24 hours) at 09/10/2020 1456 Last data filed at 09/10/2020 0526 Gross per 24 hour  Intake 1038.96 ml  Output 200 ml  Net 838.96 ml   There were no vitals filed for this visit.  Examination:  General exam: Appears calm and comfortable , not in any acute distress Respiratory  system: Clear to auscultation. Respiratory effort normal. Cardiovascular system: S1 & S2 heard, RRR. No JVD, murmurs, rubs, gallops or clicks. No pedal edema. Gastrointestinal system: Abdomen is nondistended, soft and mildly tender generalized. No organomegaly or masses felt. Normal bowel sounds heard. Central nervous system: Alert and oriented. No focal neurological deficits. Extremities: Symmetric 5 x 5 power.  No edema, no  cyanosis, no clubbing. Skin: No rashes, lesions or ulcers Psychiatry: Judgement and insight appear normal. Mood & affect appropriate.     Data Reviewed: I have personally reviewed following labs and imaging studies  CBC: Recent Labs  Lab 09/04/20 0842 09/09/20 1438 09/10/20 0503  WBC 5.3 1.3* 1.6*  NEUTROABS 3.3 0.7*  --   HGB 13.2 16.1* 12.1  HCT 38.8 43.8 33.6*  MCV 88.2 82.2 83.4  PLT 170 120* 99*   Basic Metabolic Panel: Recent Labs  Lab 09/04/20 0842 09/09/20 1438 09/10/20 0503  NA 137 126* 124*  K 4.0 3.3* 3.6  CL 106 87* 93*  CO2 23 19* 20*  GLUCOSE 93 139* 93  BUN 13 36* 19  CREATININE 0.72 3.39* 0.92  CALCIUM 9.0 7.6* 7.4*  MG  --  2.3  --    GFR: Estimated Creatinine Clearance: 84.1 mL/min (by C-G formula based on SCr of 0.92 mg/dL). Liver Function Tests: Recent Labs  Lab 09/04/20 0842 09/09/20 1438 09/10/20 0503  AST '19 19 16  '$ ALT 36 29 22  ALKPHOS 113 87 69  BILITOT 0.6 3.9* 4.2*  PROT 7.1 7.2 5.4*  ALBUMIN 3.6 3.2* 2.2*   Recent Labs  Lab 09/09/20 1438  LIPASE 15   No results for input(s): AMMONIA in the last 168 hours. Coagulation Profile: No results for input(s): INR, PROTIME in the last 168 hours. Cardiac Enzymes: No results for input(s): CKTOTAL, CKMB, CKMBINDEX, TROPONINI in the last 168 hours. BNP (last 3 results) No results for input(s): PROBNP in the last 8760 hours. HbA1C: No results for input(s): HGBA1C in the last 72 hours. CBG: No results for input(s): GLUCAP in the last 168 hours. Lipid Profile: No results for input(s): CHOL, HDL, LDLCALC, TRIG, CHOLHDL, LDLDIRECT in the last 72 hours. Thyroid Function Tests: No results for input(s): TSH, T4TOTAL, FREET4, T3FREE, THYROIDAB in the last 72 hours. Anemia Panel: No results for input(s): VITAMINB12, FOLATE, FERRITIN, TIBC, IRON, RETICCTPCT in the last 72 hours. Sepsis Labs: No results for input(s): PROCALCITON, LATICACIDVEN in the last 168 hours.  Recent Results (from  the past 240 hour(s))  Resp Panel by RT-PCR (Flu A&B, Covid) Nasopharyngeal Swab     Status: None   Collection Time: 09/09/20  5:25 PM   Specimen: Nasopharyngeal Swab; Nasopharyngeal(NP) swabs in vial transport medium  Result Value Ref Range Status   SARS Coronavirus 2 by RT PCR NEGATIVE NEGATIVE Final    Comment: (NOTE) SARS-CoV-2 target nucleic acids are NOT DETECTED.  The SARS-CoV-2 RNA is generally detectable in upper respiratory specimens during the acute phase of infection. The lowest concentration of SARS-CoV-2 viral copies this assay can detect is 138 copies/mL. A negative result does not preclude SARS-Cov-2 infection and should not be used as the sole basis for treatment or other patient management decisions. A negative result may occur with  improper specimen collection/handling, submission of specimen other than nasopharyngeal swab, presence of viral mutation(s) within the areas targeted by this assay, and inadequate number of viral copies(<138 copies/mL). A negative result must be combined with clinical observations, patient history, and epidemiological information. The expected result is Negative.  Fact Sheet for Patients:  EntrepreneurPulse.com.au  Fact Sheet for Healthcare Providers:  IncredibleEmployment.be  This test is no t yet approved or cleared by the [place] FDA and  has been authorized for detection and/or diagnosis of SARS-CoV-2 by FDA under an Emergency Use Authorization (EUA). This EUA will remain  in effect (meaning this test can be used) for the duration of the COVID-19 declaration under Section 564(b)(1) of the Act, 21 U.S.C.section 360bbb-3(b)(1), unless the authorization is terminated  or revoked sooner.       Influenza A by PCR NEGATIVE NEGATIVE Final   Influenza B by PCR NEGATIVE NEGATIVE Final    Comment: (NOTE) The Xpert Xpress SARS-CoV-2/FLU/RSV plus assay is intended as an aid in the diagnosis of  influenza from Nasopharyngeal swab specimens and should not be used as a sole basis for treatment. Nasal washings and aspirates are unacceptable for Xpert Xpress SARS-CoV-2/FLU/RSV testing.  Fact Sheet for Patients: EntrepreneurPulse.com.au  Fact Sheet for Healthcare Providers: IncredibleEmployment.be  This test is not yet approved or cleared by the [place] FDA and has been authorized for detection and/or diagnosis of SARS-CoV-2 by FDA under an Emergency Use Authorization (EUA). This EUA will remain in effect (meaning this test can be used) for the duration of the COVID-19 declaration under Section 564(b)(1) of the Act, 21 U.S.C. section 360bbb-3(b)(1), unless the authorization is terminated or revoked.  Performed at [facility], [place] [address] [place], [place] [address]      Radiology Studies: US RENAL  Result Date: 09/10/2020 CLINICAL DATA:  Acute kidney injury EXAM: RENAL / URINARY TRACT ULTRASOUND COMPLETE COMPARISON:  None. FINDINGS: Right Kidney: Renal measurements: 10.3 x 4.2 x 5.1 cm = volume: 114 mL. Echogenicity within normal limits. No mass or hydronephrosis visualized. Left Kidney: Renal measurements: 10.6 x 5.1 x 4.9 cm = volume: 140 mL. Echogenicity within normal limits. No mass or hydronephrosis visualized. Bladder: Appears normal for degree of bladder distention. Other: None. IMPRESSION: Normal renal ultrasound. Electronically Signed   By: Ulyses Jarred M.D.   On: 09/10/2020 01:52     Scheduled Meds:  Chlorhexidine Gluconate Cloth  6 each Topical Daily   feeding supplement  1 Container Oral TID BM   octreotide  100 mcg Subcutaneous Q12H   pantoprazole (PROTONIX) IV  40 mg Intravenous Q24H   scopolamine  1 patch Transdermal Q72H   Continuous Infusions:  sodium chloride 150 mL/hr at 09/10/20 0105     LOS: 1 day    Time spent: 35 mins    Kc Sedlak, MD [facility]   If 7PM-7AM,  please contact night-coverage

## 2020-09-11 DIAGNOSIS — R112 Nausea with vomiting, unspecified: Secondary | ICD-10-CM

## 2020-09-11 LAB — CBC WITH DIFFERENTIAL/PLATELET
Abs Immature Granulocytes: 0.39 10*3/uL — ABNORMAL HIGH (ref 0.00–0.07)
Basophils Absolute: 0.1 10*3/uL (ref 0.0–0.1)
Basophils Relative: 1 %
Eosinophils Absolute: 0 10*3/uL (ref 0.0–0.5)
Eosinophils Relative: 1 %
HCT: 35.7 % — ABNORMAL LOW (ref 36.0–46.0)
Hemoglobin: 12.7 g/dL (ref 12.0–15.0)
Immature Granulocytes: 6 %
Lymphocytes Relative: 3 %
Lymphs Abs: 0.2 10*3/uL — ABNORMAL LOW (ref 0.7–4.0)
MCH: 30 pg (ref 26.0–34.0)
MCHC: 35.6 g/dL (ref 30.0–36.0)
MCV: 84.4 fL (ref 80.0–100.0)
Monocytes Absolute: 1.1 10*3/uL — ABNORMAL HIGH (ref 0.1–1.0)
Monocytes Relative: 18 %
Neutro Abs: 4.4 10*3/uL (ref 1.7–7.7)
Neutrophils Relative %: 71 %
Platelets: 72 10*3/uL — ABNORMAL LOW (ref 150–400)
RBC: 4.23 MIL/uL (ref 3.87–5.11)
RDW: 12.1 % (ref 11.5–15.5)
WBC: 6.2 10*3/uL (ref 4.0–10.5)
nRBC: 0.8 % — ABNORMAL HIGH (ref 0.0–0.2)

## 2020-09-11 LAB — BASIC METABOLIC PANEL
Anion gap: 13 (ref 5–15)
BUN: 14 mg/dL (ref 6–20)
CO2: 19 mmol/L — ABNORMAL LOW (ref 22–32)
Calcium: 7.5 mg/dL — ABNORMAL LOW (ref 8.9–10.3)
Chloride: 95 mmol/L — ABNORMAL LOW (ref 98–111)
Creatinine, Ser: 0.55 mg/dL (ref 0.44–1.00)
GFR, Estimated: >60 mL/min (ref >60)
Glucose, Bld: 100 mg/dL — ABNORMAL HIGH (ref 70–99)
Potassium: 3.7 mmol/L (ref 3.5–5.1)
Sodium: 127 mmol/L — ABNORMAL LOW (ref 135–145)

## 2020-09-11 LAB — HEPATIC FUNCTION PANEL
ALT: 20 U/L (ref 0–44)
AST: 19 U/L (ref 15–41)
Albumin: 1.8 g/dL — ABNORMAL LOW (ref 3.5–5.0)
Alkaline Phosphatase: 62 U/L (ref 38–126)
Bilirubin, Direct: 2.9 mg/dL — ABNORMAL HIGH (ref 0.0–0.2)
Indirect Bilirubin: 2.2 mg/dL — ABNORMAL HIGH (ref 0.3–0.9)
Total Bilirubin: 5.1 mg/dL — ABNORMAL HIGH (ref 0.3–1.2)
Total Protein: 4.8 g/dL — ABNORMAL LOW (ref 6.5–8.1)

## 2020-09-11 LAB — PHOSPHORUS: Phosphorus: 2 mg/dL — ABNORMAL LOW (ref 2.5–4.6)

## 2020-09-11 LAB — MAGNESIUM: Magnesium: 2.6 mg/dL — ABNORMAL HIGH (ref 1.7–2.4)

## 2020-09-11 MED ORDER — SODIUM PHOSPHATES 45 MMOLE/15ML IV SOLN
30.0000 mmol | Freq: Once | INTRAVENOUS | Status: AC
  Administered 2020-09-11: 30 mmol via INTRAVENOUS
  Filled 2020-09-11: qty 10

## 2020-09-11 MED ORDER — COLESTIPOL HCL 1 G PO TABS
1.0000 g | ORAL_TABLET | Freq: Two times a day (BID) | ORAL | Status: DC
  Administered 2020-09-11 – 2020-09-13 (×6): 1 g via ORAL
  Filled 2020-09-11 (×7): qty 1

## 2020-09-11 MED ORDER — DIPHENHYDRAMINE HCL 50 MG/ML IJ SOLN
25.0000 mg | Freq: Every evening | INTRAMUSCULAR | Status: DC | PRN
  Filled 2020-09-11: qty 1

## 2020-09-11 MED ORDER — LORAZEPAM 2 MG/ML IJ SOLN
1.0000 mg | Freq: Every day | INTRAMUSCULAR | Status: DC
  Administered 2020-09-11 – 2020-09-17 (×7): 1 mg via INTRAVENOUS
  Filled 2020-09-11 (×7): qty 1

## 2020-09-11 NOTE — Progress Notes (Signed)
Ellen [name]   DOB:07/27/1995   [id number]   [id number]  Subjective:  [name] tells me she had 3-4 episodes of clear diarrhea overnight; denies pain; easily nauseated--just putting something in her mouth makes her want to retch; feels better overall; SO sleeping in room   Objective: white woman examined in bed Vitals:   09/10/20 2024 09/11/20 0519  BP: (!) 138/94 126/87  Pulse: 86 (!) 104  Resp: 18 18  Temp: 97.6 F (36.4 C) 98.6 F (37 C)  SpO2: 100% 99%    There is no height or weight on file to calculate BMI.  Intake/Output Summary (Last 24 hours) at 09/11/2020 0757 Last data filed at 09/10/2020 1600 Gross per 24 hour  Intake 1662.59 ml  Output 650 ml  Net 1012.59 ml       Lungs no rales or wheezes  Heart regular rate and rhythm  Abdomen soft, increased BS  Neuro nonfocal  Breast exam: deferred  CBG (last 3)  No results for input(s): GLUCAP in the last 72 hours.   Labs:  Lab Results  Component Value Date   WBC 6.2 09/11/2020   HGB 12.7 09/11/2020   HCT 35.7 (L) 09/11/2020   MCV 84.4 09/11/2020   PLT 72 (L) 09/11/2020   NEUTROABS 4.4 09/11/2020    '@LASTCHEMISTRY'$ @  Urine Studies No results for input(s): UHGB, CRYS in the last 72 hours.  Invalid input(s): UACOL, UAPR, USPG, UPH, UTP, UGL, UKET, UBIL, UNIT, UROB, [place], UEPI, UWBC, Duwayne Heck [place], [place]  Basic Metabolic Panel: Recent Labs  Lab 09/04/20 0842 09/09/20 1438 09/10/20 0503 09/11/20 0452  NA 137 126* 124* 127*  K 4.0 3.3* 3.6 3.7  CL 106 87* 93* 95*  CO2 23 19* 20* 19*  GLUCOSE 93 139* 93 100*  BUN 13 36* 19 14  CREATININE 0.72 3.39* 0.92 0.55  CALCIUM 9.0 7.6* 7.4* 7.5*  MG  --  2.3  --  2.6*  PHOS  --   --   --  2.0*   GFR Estimated Creatinine Clearance: 96.7 mL/min (by C-G formula based on SCr of 0.55 mg/dL). Liver Function Tests: Recent Labs  Lab 09/04/20 0842 09/09/20 1438 09/10/20 0503  AST '19 19 16  '$ ALT 36 29 22  ALKPHOS 113 87 69  BILITOT 0.6 3.9*  4.2*  PROT 7.1 7.2 5.4*  ALBUMIN 3.6 3.2* 2.2*   Recent Labs  Lab 09/09/20 1438  LIPASE 15   No results for input(s): AMMONIA in the last 168 hours. Coagulation profile No results for input(s): INR, PROTIME in the last 168 hours.  CBC: Recent Labs  Lab 09/04/20 0842 09/09/20 1438 09/10/20 0503 09/11/20 0452  WBC 5.3 1.3* 1.6* 6.2  NEUTROABS 3.3 0.7*  --  4.4  HGB 13.2 16.1* 12.1 12.7  HCT 38.8 43.8 33.6* 35.7*  MCV 88.2 82.2 83.4 84.4  PLT 170 120* 99* 72*   Cardiac Enzymes: No results for input(s): CKTOTAL, CKMB, CKMBINDEX, TROPONINI in the last 168 hours. BNP: Invalid input(s): POCBNP CBG: No results for input(s): GLUCAP in the last 168 hours. D-Dimer No results for input(s): DDIMER in the last 72 hours. Hgb A1c No results for input(s): HGBA1C in the last 72 hours. Lipid Profile No results for input(s): CHOL, HDL, LDLCALC, TRIG, CHOLHDL, LDLDIRECT in the last 72 hours. Thyroid function studies No results for input(s): TSH, T4TOTAL, T3FREE, THYROIDAB in the last 72 hours.  Invalid input(s): FREET3 Anemia work up No results for input(s): VITAMINB12, FOLATE, FERRITIN, TIBC, IRON, RETICCTPCT  in the last 72 hours. Microbiology Recent Results (from the past 240 hour(s))  Resp Panel by RT-PCR (Flu A&B, Covid) Nasopharyngeal Swab     Status: None   Collection Time: 09/09/20  5:25 PM   Specimen: Nasopharyngeal Swab; Nasopharyngeal(NP) swabs in vial transport medium  Result Value Ref Range Status   SARS Coronavirus 2 by RT PCR NEGATIVE NEGATIVE Final    Comment: (NOTE) SARS-CoV-2 target nucleic acids are NOT DETECTED.  The SARS-CoV-2 RNA is generally detectable in upper respiratory specimens during the acute phase of infection. The lowest concentration of SARS-CoV-2 viral copies this assay can detect is 138 copies/mL. A negative result does not preclude SARS-Cov-2 infection and should not be used as the sole basis for treatment or other patient management  decisions. A negative result may occur with  improper specimen collection/handling, submission of specimen other than nasopharyngeal swab, presence of viral mutation(s) within the areas targeted by this assay, and inadequate number of viral copies(<138 copies/mL). A negative result must be combined with clinical observations, patient history, and epidemiological information. The expected result is Negative.  Fact Sheet for Patients:  EntrepreneurPulse.com.au  Fact Sheet for Healthcare Providers:  IncredibleEmployment.be  This test is no t yet approved or cleared by the [place] FDA and  has been authorized for detection and/or diagnosis of SARS-CoV-2 by FDA under an Emergency Use Authorization (EUA). This EUA will remain  in effect (meaning this test can be used) for the duration of the COVID-19 declaration under Section 564(b)(1) of the Act, 21 U.S.C.section 360bbb-3(b)(1), unless the authorization is terminated  or revoked sooner.       Influenza A by PCR NEGATIVE NEGATIVE Final   Influenza B by PCR NEGATIVE NEGATIVE Final    Comment: (NOTE) The Xpert Xpress SARS-CoV-2/FLU/RSV plus assay is intended as an aid in the diagnosis of influenza from Nasopharyngeal swab specimens and should not be used as a sole basis for treatment. Nasal washings and aspirates are unacceptable for Xpert Xpress SARS-CoV-2/FLU/RSV testing.  Fact Sheet for Patients: EntrepreneurPulse.com.au  Fact Sheet for Healthcare Providers: IncredibleEmployment.be  This test is not yet approved or cleared by the [place] FDA and has been authorized for detection and/or diagnosis of SARS-CoV-2 by FDA under an Emergency Use Authorization (EUA). This EUA will remain in effect (meaning this test can be used) for the duration of the COVID-19 declaration under Section 564(b)(1) of the Act, 21 U.S.C. section 360bbb-3(b)(1), unless the  authorization is terminated or revoked.  Performed at [facility], [place] [address], [place], [place] [address]   C Difficile Quick Screen w PCR reflex     Status: None   Collection Time: 09/10/20 12:43 PM   Specimen: STOOL  Result Value Ref Range Status   C Diff antigen NEGATIVE NEGATIVE Final   C Diff toxin NEGATIVE NEGATIVE Final   C Diff interpretation No C. difficile detected.  Final    Comment: Performed at [facility], [place] [address], [place], [place] [address]      Studies:  CT ABDOMEN PELVIS W CONTRAST  Result Date: 09/11/2020 CLINICAL DATA:  Diarrhea, nausea and vomiting, recent chemotherapy for known left breast carcinoma. EXAM: CT ABDOMEN AND PELVIS WITH CONTRAST TECHNIQUE: Multidetector CT imaging of the abdomen and pelvis was performed using the standard protocol following bolus administration of intravenous contrast. CONTRAST:  102m OMNIPAQUE IOHEXOL 350 MG/ML SOLN COMPARISON:  CT 08/15/2020 FINDINGS: Lower chest: Redemonstration of a left breast mass only partially visualized on this exam  though suspect some interval decrease in size from comparison measuring 2.9 x 1.2 cm in maximal transaxial dimension within the level of imaging. Stable small ground-glass nodule seen in the anterior right middle lobe measuring 4 mm in size (4/39). No other visible concerning pulmonary nodules within the margins of imaging. Circumferential thickening and fluid-filled appearance of the distal thoracic esophagus. Normal heart size. No pericardial effusion. Terminus of a central venous catheter at the level of the right atrium. Hepatobiliary: Redemonstration of the multilobular, heterogeneous, hypoechoic focus in the anterior left lobe liver measuring 3.5 x 2.9 cm in size, decreased from 4.9 x 4.3 cm on comparison imaging when measured at a similar level. Additional smaller hypoattenuating foci seen in the anterior left and right lobes liver are similar to  decreased size from comparison prior as well though incompletely characterized on this exam. Normal background parenchymal attenuation enhancement. Smooth surface contour. Normal gallbladder and biliary tree. Pancreas: No pancreatic ductal dilatation or surrounding inflammatory changes. Spleen: Normal in size. No concerning splenic lesions. Adrenals/Urinary Tract: Normal adrenals. Kidneys are normally located with symmetric enhancement. No suspicious renal lesion, urolithiasis or hydronephrosis. Urinary bladder is unremarkable the degree of distention. Stomach/Bowel: Circumferentially thickened and fluid-filled distal thoracic esophagus with some mild mucosal hyperemia. Stomach is moderately fluid distended as well with some additional questionable thickening and mucosal hyperemia towards the distal body and antrum which may be accentuated by peristalsis. Duodenum and multiple sites throughout the small bowel demonstrate segmental regions of edematous mural thickening and mucosal hyperenhancement with generalized air and fluid distention without discrete focal transition point. Slightly more pronounced edematous changes and inflammation seen towards the terminal ileum as well. The colon is also diffusely fluid distended with some fairly focal thickening noted at the level of the rectosigmoid with a notable lack of formed stool. Vascular/Lymphatic: Central mesenteric nodes appear largely reactive without pathologically enlarged abdominopelvic adenopathy. No concerning vascular findings. Reproductive: Anteverted uterus with expected positioning of an IUD. No concerning adnexal mass or lesion. Other: Mild edematous changes of the mesenteric loops associated with segmentally thickened regions of small bowel including the duodenum, mid to distal small bowel and terminal ileum as well as the rectosigmoid. Small volume of free intraperitoneal fluid which is low-attenuation, favored to be reactive. No organized abscess or  collection. No free air. No pneumatosis or portal venous gas. No bowel containing hernia. Musculoskeletal: Diffuse bony metastatic disease throughout the imaged spine, pelvis and proximal femora including several larger expansile lesions for instance involving the left ilium which appears perhaps slightly increased in size from comparison prior measuring 2.4 x 2.6 cm, previously 2.1 x 2.3 cm. Several lesions demonstrate some increasingly sclerotic change, possibly treatment related effect. There is a pathologic compression deformity noted at the L3 level with increasing height loss, 50% centrally. Additional superior endplate compression deformity at the L4 level with up to 30% height loss centrally. Some apparent moderate canal narrowing at the L3 level secondary to posterior retropulsion. IMPRESSION: Diffuse fluid distention of both large and small bowel with multifocal regions of long segmental edematous mural thickening and mucosal hyperemia concerning for an enterocolitis of infectious, or inflammatory etiology. No evidence of bowel obstruction or perforation at this time. Redemonstration of a left breast mass only partially included within the margins of imaging. Decreasing size of the conspicuous nodular hypoattenuating lesion in the anterior left lobe liver compatible with some treatment response. Osseous metastatic disease appears similar to progressive from prior with several lesions appearing increased in size as well as  worsening pathologic compression deformities L3, L4 with retropulsion at the L3 level resulting in moderate canal stenosis. Stable 4 mm ground-glass nodule in the right middle lobe, indeterminate. Electronically Signed   By: Lovena Le M.D.   On: 09/11/2020 00:07   US RENAL  Result Date: 09/10/2020 CLINICAL DATA:  Acute kidney injury EXAM: RENAL / URINARY TRACT ULTRASOUND COMPLETE COMPARISON:  None. FINDINGS: Right Kidney: Renal measurements: 10.3 x 4.2 x 5.1 cm = volume: 114 mL.  Echogenicity within normal limits. No mass or hydronephrosis visualized. Left Kidney: Renal measurements: 10.6 x 5.1 x 4.9 cm = volume: 140 mL. Echogenicity within normal limits. No mass or hydronephrosis visualized. Bladder: Appears normal for degree of bladder distention. Other: None. IMPRESSION: Normal renal ultrasound. Electronically Signed   By: Ulyses Jarred M.D.   On: 09/10/2020 01:52    Assessment: 25 y.o. [place] woman with a triple-positive invasive ductal carcinoma presenting June 2022 with stage IV disease (bones, liver, possibly lung, as well as left breast and regional adenopathy)  (A) s/p palliative radiation to lumbar spine and right pelvis completed 08/26/2020  (B) s/p cycle 1 Herceptin/Perjeta (08/31/2020) and docetaxel (09/04/2020) -- did not receive carboplatin or pegfilgrastim  (C) on goserelin Q 28 days, most recent dose 08/17/2020  (D) admitted with uncontrolled diarrhea, nausea/vomiting, transient neutropenia    Plan: Diarrhea is c diff negative; most like it is due to Perjeta; pelvis radiation can also contribute-- however CT scan within past 24 h shows diffuse GI tract edema outside the radiation field. It also shows evidence of early response. Given adequate WBC and no fever typhlitis unlikely. Perjeta-associated diarrhea can last 5-14 days; treatment is supportive; imodium and colestipol or Lucrezia Starch can be helpful; patient is not likely to be able to take more than clear liquids for next few days.  Recommend continued close monitoring and correction of electrolytes as you are doing; encouraged patient to be "vertical" during day (use recliner, take short walks in room); she requested sleep aides which I will write.  Ct scans also show evidence of early response which is favorable. Will follow with you   Chauncey Cruel, MD 09/11/2020  7:57 AM Medical Oncology and Hematology [facility] [address] [place], [place] [address] Tel. [phone]     Fax. [phone]

## 2020-09-11 NOTE — Progress Notes (Addendum)
PROGRESS NOTE    Ellen [name]  [id number] DOB: September 16, 1995 DOA: 09/09/2020 PCP: Pcp, No   Brief Narrative:  This 25 years old female with PMH significant for carcinoma of left breast, ADHD, anxiety presented to the ED with complaints of persistent nausea, vomiting and diarrhea.  Patient has recently completed 10-day course of chemotherapy.  During this time she has developed diarrhea intermittently, it became persistent for last 4 days. Patient also reported during this time, she had 2 rounds of chemo with the most recent being 2 days ago. She has  developed persistent nausea and vomiting and decreased p.o. intake.  She is unable to keep anything down. She was found to be in sinus tachycardia, she was given IV fluids which improved the heart rate. Serum creatinine found to be 3.36 which is improved with IV hydration.  Patient is found to be neutropenic but afebrile.  Assessment & Plan:   Active Problems:   AKI (acute kidney injury) ([place])  Intractable nausea, vomiting and diarrhea in the setting of chemotherapy for left breast cancer: Patient recently has completed 10-day course of chemotherapy. She has developed persistent nausea, vomiting and diarrhea during that course. She is found to have high anion gap metabolic acidosis, acute kidney injury.  She reports worsening abdominal pain There are no signs of infection noted,  She remained afebrile,  denies any recent antibiotic use, denies any sick contacts. Continue IV fluids, scopolamine, Ativan and Protonix as needed. Started on clear liquid diet, will advance as tolerated. Stool for C Diff : Negative , Continue Imodium as needed. Heme states she is having Perjeta associated diarrhea , typhlitis unlikely given no leukocytosis, remains afebrile. Treatment is mainly supportive,  continue Imodium, colestipol   and Questran   AKI secondary to dehydration: > Resolved. Serum creatinine 3.39 at admission,  baseline creatinine  0.7-0.8 AKI resolved with IV hydration.  Serum creatinine back to baseline  Hypokalemia: Replaced,  continue to monitor.  Hyponatremia: This could be secondary to intractable nausea and vomiting. Continue gentle hydration sodium is slowly improving.  Prolonged QT interval: Avoid Zofran and Reglan. Repeat EKG normalization of QT prolongation.  History of breast cancer on chemo: Heme-onc consulted recommended to continue supportive care. Perjeta associated diarrhea , typhlitis unlikely given no leukocytosis, remains afebrile. Treatment is mainly supportive,  continue Imodium, colestipol   and Questran   Leukopenia/ thrombocytopenia/ neutropenia: Continue neutropenic precautions, no evidence of infection.    DVT prophylaxis: SCDs Code Status: Full code Family Communication: Mother at  bedside Disposition Plan:   Status is: Inpatient  Remains inpatient appropriate because:Inpatient level of care appropriate due to severity of illness  Dispo: The patient is from: Home              Anticipated d/c is to: Home              Patient currently is not medically stable to d/c.   Difficult to place patient No  Consultants:  Hematology  Procedures:  None Antimicrobials: None   Subjective: Patient was seen and examined at bedside.  Overnight events noted.   She continues to feel nauseous and states she is unable to tolerate anything down. She reports abdominal pain is better,  she has thrown up 4 times since morning.  Objective: Vitals:   09/10/20 2024 09/11/20 0519 09/11/20 1029 09/11/20 1403  BP: (!) 138/94 126/87 (!) 146/100 135/89  Pulse: 86 (!) 104 61 93  Resp: '18 18  18  '$ Temp: 97.6 F (36.4  C) 98.6 F (37 C)  98 F (36.7 C)  TempSrc: Oral Oral  Oral  SpO2: 100% 99%  99%    Intake/Output Summary (Last 24 hours) at 09/11/2020 1456 Last data filed at 09/10/2020 1600 Gross per 24 hour  Intake 1422.59 ml  Output 300 ml  Net 1122.59 ml   There were no vitals  filed for this visit.  Examination:  General exam: Appears calm and comfortable.  Not in any acute distress. Respiratory system: Clear to auscultation. Respiratory effort normal. Cardiovascular system: S1 & S2 heard, RRR. No JVD, murmurs, rubs, gallops or clicks. No pedal edema. Gastrointestinal system: Abdomen is soft, nondistended but mildly tender.  No organomegaly or masses felt. Normal bowel sounds heard. Central nervous system: Alert and oriented. No focal neurological deficits. Extremities: No edema, no cyanosis, no clubbing. Skin: No rashes, lesions or ulcers Psychiatry: Judgement and insight appear normal. Mood & affect appropriate.     Data Reviewed: I have personally reviewed following labs and imaging studies  CBC: Recent Labs  Lab 09/09/20 1438 09/10/20 0503 09/11/20 0452  WBC 1.3* 1.6* 6.2  NEUTROABS 0.7*  --  4.4  HGB 16.1* 12.1 12.7  HCT 43.8 33.6* 35.7*  MCV 82.2 83.4 84.4  PLT 120* 99* 72*   Basic Metabolic Panel: Recent Labs  Lab 09/09/20 1438 09/10/20 0503 09/11/20 0452  NA 126* 124* 127*  K 3.3* 3.6 3.7  CL 87* 93* 95*  CO2 19* 20* 19*  GLUCOSE 139* 93 100*  BUN 36* 19 14  CREATININE 3.39* 0.92 0.55  CALCIUM 7.6* 7.4* 7.5*  MG 2.3  --  2.6*  PHOS  --   --  2.0*   GFR: Estimated Creatinine Clearance: 96.7 mL/min (by C-G formula based on SCr of 0.55 mg/dL). Liver Function Tests: Recent Labs  Lab 09/09/20 1438 09/10/20 0503  AST 19 16  ALT 29 22  ALKPHOS 87 69  BILITOT 3.9* 4.2*  PROT 7.2 5.4*  ALBUMIN 3.2* 2.2*   Recent Labs  Lab 09/09/20 1438  LIPASE 15   No results for input(s): AMMONIA in the last 168 hours. Coagulation Profile: No results for input(s): INR, PROTIME in the last 168 hours. Cardiac Enzymes: No results for input(s): CKTOTAL, CKMB, CKMBINDEX, TROPONINI in the last 168 hours. BNP (last 3 results) No results for input(s): PROBNP in the last 8760 hours. HbA1C: No results for input(s): HGBA1C in the last 72  hours. CBG: No results for input(s): GLUCAP in the last 168 hours. Lipid Profile: No results for input(s): CHOL, HDL, LDLCALC, TRIG, CHOLHDL, LDLDIRECT in the last 72 hours. Thyroid Function Tests: No results for input(s): TSH, T4TOTAL, FREET4, T3FREE, THYROIDAB in the last 72 hours. Anemia Panel: No results for input(s): VITAMINB12, FOLATE, FERRITIN, TIBC, IRON, RETICCTPCT in the last 72 hours. Sepsis Labs: No results for input(s): PROCALCITON, LATICACIDVEN in the last 168 hours.  Recent Results (from the past 240 hour(s))  Resp Panel by RT-PCR (Flu A&B, Covid) Nasopharyngeal Swab     Status: None   Collection Time: 09/09/20  5:25 PM   Specimen: Nasopharyngeal Swab; Nasopharyngeal(NP) swabs in vial transport medium  Result Value Ref Range Status   SARS Coronavirus 2 by RT PCR NEGATIVE NEGATIVE Final    Comment: (NOTE) SARS-CoV-2 target nucleic acids are NOT DETECTED.  The SARS-CoV-2 RNA is generally detectable in upper respiratory specimens during the acute phase of infection. The lowest concentration of SARS-CoV-2 viral copies this assay can detect is 138 copies/mL. A negative result does not  preclude SARS-Cov-2 infection and should not be used as the sole basis for treatment or other patient management decisions. A negative result may occur with  improper specimen collection/handling, submission of specimen other than nasopharyngeal swab, presence of viral mutation(s) within the areas targeted by this assay, and inadequate number of viral copies(<138 copies/mL). A negative result must be combined with clinical observations, patient history, and epidemiological information. The expected result is Negative.  Fact Sheet for Patients:  EntrepreneurPulse.com.au  Fact Sheet for Healthcare Providers:  IncredibleEmployment.be  This test is no t yet approved or cleared by the [place] FDA and  has been authorized for detection and/or  diagnosis of SARS-CoV-2 by FDA under an Emergency Use Authorization (EUA). This EUA will remain  in effect (meaning this test can be used) for the duration of the COVID-19 declaration under Section 564(b)(1) of the Act, 21 U.S.C.section 360bbb-3(b)(1), unless the authorization is terminated  or revoked sooner.       Influenza A by PCR NEGATIVE NEGATIVE Final   Influenza B by PCR NEGATIVE NEGATIVE Final    Comment: (NOTE) The Xpert Xpress SARS-CoV-2/FLU/RSV plus assay is intended as an aid in the diagnosis of influenza from Nasopharyngeal swab specimens and should not be used as a sole basis for treatment. Nasal washings and aspirates are unacceptable for Xpert Xpress SARS-CoV-2/FLU/RSV testing.  Fact Sheet for Patients: EntrepreneurPulse.com.au  Fact Sheet for Healthcare Providers: IncredibleEmployment.be  This test is not yet approved or cleared by the [place] FDA and has been authorized for detection and/or diagnosis of SARS-CoV-2 by FDA under an Emergency Use Authorization (EUA). This EUA will remain in effect (meaning this test can be used) for the duration of the COVID-19 declaration under Section 564(b)(1) of the Act, 21 U.S.C. section 360bbb-3(b)(1), unless the authorization is terminated or revoked.  Performed at [facility], [place] [address], [place], [place] [address]   C Difficile Quick Screen w PCR reflex     Status: None   Collection Time: 09/10/20 12:43 PM   Specimen: STOOL  Result Value Ref Range Status   C Diff antigen NEGATIVE NEGATIVE Final   C Diff toxin NEGATIVE NEGATIVE Final   C Diff interpretation No C. difficile detected.  Final    Comment: Performed at [facility], [place] [address], [place], [place] [address]     Radiology Studies: CT ABDOMEN PELVIS W CONTRAST  Result Date: 09/11/2020 CLINICAL DATA:  Diarrhea, nausea and vomiting, recent chemotherapy for known left  breast carcinoma. EXAM: CT ABDOMEN AND PELVIS WITH CONTRAST TECHNIQUE: Multidetector CT imaging of the abdomen and pelvis was performed using the standard protocol following bolus administration of intravenous contrast. CONTRAST:  26m OMNIPAQUE IOHEXOL 350 MG/ML SOLN COMPARISON:  CT 08/15/2020 FINDINGS: Lower chest: Redemonstration of a left breast mass only partially visualized on this exam though suspect some interval decrease in size from comparison measuring 2.9 x 1.2 cm in maximal transaxial dimension within the level of imaging. Stable small ground-glass nodule seen in the anterior right middle lobe measuring 4 mm in size (4/39). No other visible concerning pulmonary nodules within the margins of imaging. Circumferential thickening and fluid-filled appearance of the distal thoracic esophagus. Normal heart size. No pericardial effusion. Terminus of a central venous catheter at the level of the right atrium. Hepatobiliary: Redemonstration of the multilobular, heterogeneous, hypoechoic focus in the anterior left lobe liver measuring 3.5 x 2.9 cm in size, decreased from 4.9 x 4.3 cm on comparison imaging when measured at a  similar level. Additional smaller hypoattenuating foci seen in the anterior left and right lobes liver are similar to decreased size from comparison prior as well though incompletely characterized on this exam. Normal background parenchymal attenuation enhancement. Smooth surface contour. Normal gallbladder and biliary tree. Pancreas: No pancreatic ductal dilatation or surrounding inflammatory changes. Spleen: Normal in size. No concerning splenic lesions. Adrenals/Urinary Tract: Normal adrenals. Kidneys are normally located with symmetric enhancement. No suspicious renal lesion, urolithiasis or hydronephrosis. Urinary bladder is unremarkable the degree of distention. Stomach/Bowel: Circumferentially thickened and fluid-filled distal thoracic esophagus with some mild mucosal hyperemia. Stomach  is moderately fluid distended as well with some additional questionable thickening and mucosal hyperemia towards the distal body and antrum which may be accentuated by peristalsis. Duodenum and multiple sites throughout the small bowel demonstrate segmental regions of edematous mural thickening and mucosal hyperenhancement with generalized air and fluid distention without discrete focal transition point. Slightly more pronounced edematous changes and inflammation seen towards the terminal ileum as well. The colon is also diffusely fluid distended with some fairly focal thickening noted at the level of the rectosigmoid with a notable lack of formed stool. Vascular/Lymphatic: Central mesenteric nodes appear largely reactive without pathologically enlarged abdominopelvic adenopathy. No concerning vascular findings. Reproductive: Anteverted uterus with expected positioning of an IUD. No concerning adnexal mass or lesion. Other: Mild edematous changes of the mesenteric loops associated with segmentally thickened regions of small bowel including the duodenum, mid to distal small bowel and terminal ileum as well as the rectosigmoid. Small volume of free intraperitoneal fluid which is low-attenuation, favored to be reactive. No organized abscess or collection. No free air. No pneumatosis or portal venous gas. No bowel containing hernia. Musculoskeletal: Diffuse bony metastatic disease throughout the imaged spine, pelvis and proximal femora including several larger expansile lesions for instance involving the left ilium which appears perhaps slightly increased in size from comparison prior measuring 2.4 x 2.6 cm, previously 2.1 x 2.3 cm. Several lesions demonstrate some increasingly sclerotic change, possibly treatment related effect. There is a pathologic compression deformity noted at the L3 level with increasing height loss, 50% centrally. Additional superior endplate compression deformity at the L4 level with up to 30%  height loss centrally. Some apparent moderate canal narrowing at the L3 level secondary to posterior retropulsion. IMPRESSION: Diffuse fluid distention of both large and small bowel with multifocal regions of long segmental edematous mural thickening and mucosal hyperemia concerning for an enterocolitis of infectious, or inflammatory etiology. No evidence of bowel obstruction or perforation at this time. Redemonstration of a left breast mass only partially included within the margins of imaging. Decreasing size of the conspicuous nodular hypoattenuating lesion in the anterior left lobe liver compatible with some treatment response. Osseous metastatic disease appears similar to progressive from prior with several lesions appearing increased in size as well as worsening pathologic compression deformities L3, L4 with retropulsion at the L3 level resulting in moderate canal stenosis. Stable 4 mm ground-glass nodule in the right middle lobe, indeterminate. Electronically Signed   By: Lovena Le M.D.   On: 09/11/2020 00:07   US RENAL  Result Date: 09/10/2020 CLINICAL DATA:  Acute kidney injury EXAM: RENAL / URINARY TRACT ULTRASOUND COMPLETE COMPARISON:  None. FINDINGS: Right Kidney: Renal measurements: 10.3 x 4.2 x 5.1 cm = volume: 114 mL. Echogenicity within normal limits. No mass or hydronephrosis visualized. Left Kidney: Renal measurements: 10.6 x 5.1 x 4.9 cm = volume: 140 mL. Echogenicity within normal limits. No mass or hydronephrosis visualized. Bladder:  Appears normal for degree of bladder distention. Other: None. IMPRESSION: Normal renal ultrasound. Electronically Signed   By: Ulyses Jarred M.D.   On: 09/10/2020 01:52     Scheduled Meds:  Chlorhexidine Gluconate Cloth  6 each Topical Daily   colestipol  1 g Oral BID   LORazepam  1 mg Intravenous QHS   octreotide  100 mcg Subcutaneous Q12H   pantoprazole (PROTONIX) IV  40 mg Intravenous Q24H   scopolamine  1 patch Transdermal Q72H   Continuous  Infusions:  sodium chloride 150 mL/hr at 09/11/20 1040   sodium phosphate  Dextrose 5% IVPB 30 mmol (09/11/20 1036)     LOS: 2 days    Time spent: 25 mins    Almena Hokenson, MD [facility]   If 7PM-7AM, please contact night-coverage

## 2020-09-11 NOTE — Plan of Care (Signed)
  Problem: Education: Goal: Knowledge of General Education information will improve Description: Including pain rating scale, medication(s)/side effects and non-pharmacologic comfort measures Outcome: Progressing   Problem: Clinical Measurements: Goal: Ability to maintain clinical measurements within normal limits will improve Outcome: Progressing Goal: Will remain free from infection Outcome: Progressing Goal: Diagnostic test results will improve Outcome: Progressing Goal: Respiratory complications will improve Outcome: Progressing Goal: Cardiovascular complication will be avoided Outcome: Progressing   Problem: Activity: Goal: Risk for activity intolerance will decrease Outcome: Progressing   Problem: Nutrition: Goal: Adequate nutrition will be maintained Outcome: Progressing   Problem: Safety: Goal: Ability to remain free from injury will improve Outcome: Progressing   Problem: Skin Integrity: Goal: Risk for impaired skin integrity will decrease Outcome: Progressing   

## 2020-09-12 ENCOUNTER — Encounter: Payer: Self-pay | Admitting: Genetic Counselor

## 2020-09-12 ENCOUNTER — Ambulatory Visit: Payer: Self-pay | Admitting: Genetic Counselor

## 2020-09-12 DIAGNOSIS — Z1379 Encounter for other screening for genetic and chromosomal anomalies: Secondary | ICD-10-CM

## 2020-09-12 LAB — CBC WITH DIFFERENTIAL/PLATELET
Abs Immature Granulocytes: 0.64 10*3/uL — ABNORMAL HIGH (ref 0.00–0.07)
Basophils Absolute: 0 10*3/uL (ref 0.0–0.1)
Basophils Relative: 0 %
Eosinophils Absolute: 0 10*3/uL (ref 0.0–0.5)
Eosinophils Relative: 0 %
HCT: 32.4 % — ABNORMAL LOW (ref 36.0–46.0)
Hemoglobin: 11.3 g/dL — ABNORMAL LOW (ref 12.0–15.0)
Immature Granulocytes: 7 %
Lymphocytes Relative: 5 %
Lymphs Abs: 0.4 10*3/uL — ABNORMAL LOW (ref 0.7–4.0)
MCH: 29.9 pg (ref 26.0–34.0)
MCHC: 34.9 g/dL (ref 30.0–36.0)
MCV: 85.7 fL (ref 80.0–100.0)
Monocytes Absolute: 1 10*3/uL (ref 0.1–1.0)
Monocytes Relative: 11 %
Neutro Abs: 6.7 10*3/uL (ref 1.7–7.7)
Neutrophils Relative %: 77 %
Platelets: 62 10*3/uL — ABNORMAL LOW (ref 150–400)
RBC: 3.78 MIL/uL — ABNORMAL LOW (ref 3.87–5.11)
RDW: 12.1 % (ref 11.5–15.5)
WBC: 8.7 10*3/uL (ref 4.0–10.5)
nRBC: 0.2 % (ref 0.0–0.2)

## 2020-09-12 LAB — COMPREHENSIVE METABOLIC PANEL
ALT: 22 U/L (ref 0–44)
AST: 22 U/L (ref 15–41)
Albumin: 1.8 g/dL — ABNORMAL LOW (ref 3.5–5.0)
Alkaline Phosphatase: 63 U/L (ref 38–126)
Anion gap: 13 (ref 5–15)
BUN: 8 mg/dL (ref 6–20)
CO2: 22 mmol/L (ref 22–32)
Calcium: 7.7 mg/dL — ABNORMAL LOW (ref 8.9–10.3)
Chloride: 95 mmol/L — ABNORMAL LOW (ref 98–111)
Creatinine, Ser: 0.5 mg/dL (ref 0.44–1.00)
GFR, Estimated: >60 mL/min (ref >60)
Glucose, Bld: 98 mg/dL (ref 70–99)
Potassium: 3 mmol/L — ABNORMAL LOW (ref 3.5–5.1)
Sodium: 130 mmol/L — ABNORMAL LOW (ref 135–145)
Total Bilirubin: 5 mg/dL — ABNORMAL HIGH (ref 0.3–1.2)
Total Protein: 4.6 g/dL — ABNORMAL LOW (ref 6.5–8.1)

## 2020-09-12 LAB — MAGNESIUM: Magnesium: 2.3 mg/dL (ref 1.7–2.4)

## 2020-09-12 LAB — PHOSPHORUS: Phosphorus: 1.8 mg/dL — ABNORMAL LOW (ref 2.5–4.6)

## 2020-09-12 MED ORDER — POTASSIUM CHLORIDE 10 MEQ/100ML IV SOLN
10.0000 meq | INTRAVENOUS | Status: AC
  Administered 2020-09-12 (×4): 10 meq via INTRAVENOUS
  Filled 2020-09-12 (×4): qty 100

## 2020-09-12 MED ORDER — POTASSIUM CHLORIDE 2 MEQ/ML IV SOLN
INTRAVENOUS | Status: DC
  Filled 2020-09-12 (×12): qty 1000

## 2020-09-12 MED ORDER — ORAL CARE MOUTH RINSE
15.0000 mL | Freq: Two times a day (BID) | OROMUCOSAL | Status: DC
  Administered 2020-09-12 – 2020-09-18 (×12): 15 mL via OROMUCOSAL

## 2020-09-12 MED ORDER — KCL-LACTATED RINGERS 20 MEQ/L IV SOLN: INTRAVENOUS | Status: DC

## 2020-09-12 MED ORDER — CHLORHEXIDINE GLUCONATE 0.12 % MT SOLN
15.0000 mL | Freq: Two times a day (BID) | OROMUCOSAL | Status: DC
  Administered 2020-09-12 – 2020-09-18 (×12): 15 mL via OROMUCOSAL
  Filled 2020-09-12 (×11): qty 15

## 2020-09-12 NOTE — Progress Notes (Signed)
Ellen [name]   DOB:28-Jan-1996   JZ:[id number]   [id number]  Subjective:  Ellen [name] is possibly slightly better.  She tells me she has been able to keep some clear liquids down.  She is still having multiple diarrheas overnight mostly liquid.  She tells me she has not received any colestipol.  SO sleeping in room   Objective: white woman examined in bed Vitals:   09/12/20 0632 09/12/20 1431  BP: 140/89 130/89  Pulse: (!) 104 (!) 109  Resp: 20 20  Temp: 99.1 F (37.3 C) 98.7 F (37.1 C)  SpO2: 99% 100%    There is no height or weight on file to calculate BMI.  Intake/Output Summary (Last 24 hours) at 09/12/2020 1757 Last data filed at 09/12/2020 1500 Gross per 24 hour  Intake 1841.64 ml  Output 2 ml  Net 1839.64 ml    CBG (last 3)  No results for input(s): GLUCAP in the last 72 hours.   Labs:  Lab Results  Component Value Date   WBC 8.7 09/12/2020   HGB 11.3 (L) 09/12/2020   HCT 32.4 (L) 09/12/2020   MCV 85.7 09/12/2020   PLT 62 (L) 09/12/2020   NEUTROABS 6.7 09/12/2020    '@LASTCHEMISTRY'$ @  Urine Studies No results for input(s): UHGB, CRYS in the last 72 hours.  Invalid input(s): UACOL, UAPR, USPG, UPH, UTP, UGL, UKET, UBIL, UNIT, UROB, [place], UEPI, UWBC, [place], [place], [place], [place], [place]  Basic Metabolic Panel: Recent Labs  Lab 09/09/20 1438 09/10/20 0503 09/11/20 0452 09/12/20 0525  NA 126* 124* 127* 130*  K 3.3* 3.6 3.7 3.0*  CL 87* 93* 95* 95*  CO2 19* 20* 19* 22  GLUCOSE 139* 93 100* 98  BUN 36* '19 14 8  '$ CREATININE 3.39* 0.92 0.55 0.50  CALCIUM 7.6* 7.4* 7.5* 7.7*  MG 2.3  --  2.6* 2.3  PHOS  --   --  2.0* 1.8*   GFR Estimated Creatinine Clearance: 96.7 mL/min (by C-G formula based on SCr of 0.5 mg/dL). Liver Function Tests: Recent Labs  Lab 09/09/20 1438 09/10/20 0503 09/11/20 1330 09/12/20 0525  AST '19 16 19 22  '$ ALT '29 22 20 22  '$ ALKPHOS 87 69 62 63  BILITOT 3.9* 4.2* 5.1* 5.0*  PROT 7.2 5.4* 4.8* 4.6*  ALBUMIN 3.2* 2.2* 1.8* 1.8*    Recent Labs  Lab 09/09/20 1438  LIPASE 15   No results for input(s): AMMONIA in the last 168 hours. Coagulation profile No results for input(s): INR, PROTIME in the last 168 hours.  CBC: Recent Labs  Lab 09/09/20 1438 09/10/20 0503 09/11/20 0452 09/12/20 0525  WBC 1.3* 1.6* 6.2 8.7  NEUTROABS 0.7*  --  4.4 6.7  HGB 16.1* 12.1 12.7 11.3*  HCT 43.8 33.6* 35.7* 32.4*  MCV 82.2 83.4 84.4 85.7  PLT 120* 99* 72* 62*   Cardiac Enzymes: No results for input(s): CKTOTAL, CKMB, CKMBINDEX, TROPONINI in the last 168 hours. BNP: Invalid input(s): POCBNP CBG: No results for input(s): GLUCAP in the last 168 hours. D-Dimer No results for input(s): DDIMER in the last 72 hours. Hgb A1c No results for input(s): HGBA1C in the last 72 hours. Lipid Profile No results for input(s): CHOL, HDL, LDLCALC, TRIG, CHOLHDL, LDLDIRECT in the last 72 hours. Thyroid function studies No results for input(s): TSH, T4TOTAL, T3FREE, THYROIDAB in the last 72 hours.  Invalid input(s): FREET3 Anemia work up No results for input(s): VITAMINB12, FOLATE, FERRITIN, TIBC, IRON, RETICCTPCT in the last 72 hours. Microbiology Recent Results (from the  past 240 hour(s))  Resp Panel by RT-PCR (Flu A&B, Covid) Nasopharyngeal Swab     Status: None   Collection Time: 09/09/20  5:25 PM   Specimen: Nasopharyngeal Swab; Nasopharyngeal(NP) swabs in vial transport medium  Result Value Ref Range Status   SARS Coronavirus 2 by RT PCR NEGATIVE NEGATIVE Final    Comment: (NOTE) SARS-CoV-2 target nucleic acids are NOT DETECTED.  The SARS-CoV-2 RNA is generally detectable in upper respiratory specimens during the acute phase of infection. The lowest concentration of SARS-CoV-2 viral copies this assay can detect is 138 copies/mL. A negative result does not preclude SARS-Cov-2 infection and should not be used as the sole basis for treatment or other patient management decisions. A negative result may occur with  improper  specimen collection/handling, submission of specimen other than nasopharyngeal swab, presence of viral mutation(s) within the areas targeted by this assay, and inadequate number of viral copies(<138 copies/mL). A negative result must be combined with clinical observations, patient history, and epidemiological information. The expected result is Negative.  Fact Sheet for Patients:  EntrepreneurPulse.com.au  Fact Sheet for Healthcare Providers:  IncredibleEmployment.be  This test is no t yet approved or cleared by the [place] FDA and  has been authorized for detection and/or diagnosis of SARS-CoV-2 by FDA under an Emergency Use Authorization (EUA). This EUA will remain  in effect (meaning this test can be used) for the duration of the COVID-19 declaration under Section 564(b)(1) of the Act, 21 U.S.C.section 360bbb-3(b)(1), unless the authorization is terminated  or revoked sooner.       Influenza A by PCR NEGATIVE NEGATIVE Final   Influenza B by PCR NEGATIVE NEGATIVE Final    Comment: (NOTE) The Xpert Xpress SARS-CoV-2/FLU/RSV plus assay is intended as an aid in the diagnosis of influenza from Nasopharyngeal swab specimens and should not be used as a sole basis for treatment. Nasal washings and aspirates are unacceptable for Xpert Xpress SARS-CoV-2/FLU/RSV testing.  Fact Sheet for Patients: EntrepreneurPulse.com.au  Fact Sheet for Healthcare Providers: IncredibleEmployment.be  This test is not yet approved or cleared by the [place] FDA and has been authorized for detection and/or diagnosis of SARS-CoV-2 by FDA under an Emergency Use Authorization (EUA). This EUA will remain in effect (meaning this test can be used) for the duration of the COVID-19 declaration under Section 564(b)(1) of the Act, 21 U.S.C. section 360bbb-3(b)(1), unless the authorization is terminated or revoked.  Performed at  [facility], [place] [address] [place], [place] [address]   C Difficile Quick Screen w PCR reflex     Status: None   Collection Time: 09/10/20 12:43 PM   Specimen: STOOL  Result Value Ref Range Status   C Diff antigen NEGATIVE NEGATIVE Final   C Diff toxin NEGATIVE NEGATIVE Final   C Diff interpretation No C. difficile detected.  Final    Comment: Performed at [facility], [place] [address], [place], [place] [address]      Studies:  CT ABDOMEN PELVIS W CONTRAST  Result Date: 09/11/2020 CLINICAL DATA:  Diarrhea, nausea and vomiting, recent chemotherapy for known left breast carcinoma. EXAM: CT ABDOMEN AND PELVIS WITH CONTRAST TECHNIQUE: Multidetector CT imaging of the abdomen and pelvis was performed using the standard protocol following bolus administration of intravenous contrast. CONTRAST:  25m OMNIPAQUE IOHEXOL 350 MG/ML SOLN COMPARISON:  CT 08/15/2020 FINDINGS: Lower chest: Redemonstration of a left breast mass only partially visualized on this exam though suspect some interval decrease in size from comparison measuring  2.9 x 1.2 cm in maximal transaxial dimension within the level of imaging. Stable small ground-glass nodule seen in the anterior right middle lobe measuring 4 mm in size (4/39). No other visible concerning pulmonary nodules within the margins of imaging. Circumferential thickening and fluid-filled appearance of the distal thoracic esophagus. Normal heart size. No pericardial effusion. Terminus of a central venous catheter at the level of the right atrium. Hepatobiliary: Redemonstration of the multilobular, heterogeneous, hypoechoic focus in the anterior left lobe liver measuring 3.5 x 2.9 cm in size, decreased from 4.9 x 4.3 cm on comparison imaging when measured at a similar level. Additional smaller hypoattenuating foci seen in the anterior left and right lobes liver are similar to decreased size from comparison prior as well though  incompletely characterized on this exam. Normal background parenchymal attenuation enhancement. Smooth surface contour. Normal gallbladder and biliary tree. Pancreas: No pancreatic ductal dilatation or surrounding inflammatory changes. Spleen: Normal in size. No concerning splenic lesions. Adrenals/Urinary Tract: Normal adrenals. Kidneys are normally located with symmetric enhancement. No suspicious renal lesion, urolithiasis or hydronephrosis. Urinary bladder is unremarkable the degree of distention. Stomach/Bowel: Circumferentially thickened and fluid-filled distal thoracic esophagus with some mild mucosal hyperemia. Stomach is moderately fluid distended as well with some additional questionable thickening and mucosal hyperemia towards the distal body and antrum which may be accentuated by peristalsis. Duodenum and multiple sites throughout the small bowel demonstrate segmental regions of edematous mural thickening and mucosal hyperenhancement with generalized air and fluid distention without discrete focal transition point. Slightly more pronounced edematous changes and inflammation seen towards the terminal ileum as well. The colon is also diffusely fluid distended with some fairly focal thickening noted at the level of the rectosigmoid with a notable lack of formed stool. Vascular/Lymphatic: Central mesenteric nodes appear largely reactive without pathologically enlarged abdominopelvic adenopathy. No concerning vascular findings. Reproductive: Anteverted uterus with expected positioning of an IUD. No concerning adnexal mass or lesion. Other: Mild edematous changes of the mesenteric loops associated with segmentally thickened regions of small bowel including the duodenum, mid to distal small bowel and terminal ileum as well as the rectosigmoid. Small volume of free intraperitoneal fluid which is low-attenuation, favored to be reactive. No organized abscess or collection. No free air. No pneumatosis or portal  venous gas. No bowel containing hernia. Musculoskeletal: Diffuse bony metastatic disease throughout the imaged spine, pelvis and proximal femora including several larger expansile lesions for instance involving the left ilium which appears perhaps slightly increased in size from comparison prior measuring 2.4 x 2.6 cm, previously 2.1 x 2.3 cm. Several lesions demonstrate some increasingly sclerotic change, possibly treatment related effect. There is a pathologic compression deformity noted at the L3 level with increasing height loss, 50% centrally. Additional superior endplate compression deformity at the L4 level with up to 30% height loss centrally. Some apparent moderate canal narrowing at the L3 level secondary to posterior retropulsion. IMPRESSION: Diffuse fluid distention of both large and small bowel with multifocal regions of long segmental edematous mural thickening and mucosal hyperemia concerning for an enterocolitis of infectious, or inflammatory etiology. No evidence of bowel obstruction or perforation at this time. Redemonstration of a left breast mass only partially included within the margins of imaging. Decreasing size of the conspicuous nodular hypoattenuating lesion in the anterior left lobe liver compatible with some treatment response. Osseous metastatic disease appears similar to progressive from prior with several lesions appearing increased in size as well as worsening pathologic compression deformities L3, L4 with retropulsion at the  L3 level resulting in moderate canal stenosis. Stable 4 mm ground-glass nodule in the right middle lobe, indeterminate. Electronically Signed   By: Lovena Le M.D.   On: 09/11/2020 00:07    Assessment: 25 y.o. [place] woman with a triple-positive invasive ductal carcinoma presenting June 2022 with stage IV disease (bones, liver, possibly lung, as well as left breast and regional adenopathy)  (A) s/p palliative radiation to lumbar spine and right pelvis  completed 08/26/2020  (B) s/p cycle 1 Herceptin/Perjeta (08/31/2020) and docetaxel (09/04/2020) -- did not receive carboplatin or pegfilgrastim  (C) on goserelin Q 28 days, most recent dose 08/17/2020  (D) admitted with uncontrolled diarrhea, nausea/vomiting, transient neutropenia    Plan:  .  If it is no longer neutropenic.  Her platelets are still trending down and we will continue to follow them.  Her electrolytes continue to be watched and she is currently hypokalemic although her sodium is improved.  Magnesium is adequate.  She is being hydrated aggressively  I am expecting improvement in her symptoms over the next day or 2.  If this does not occur I would consider GI evaluation for possible flexible sigmoidoscopy but I do not believe as needed below that she has typhlitis or infectious diarrhea.  I discussed with the nurse that the patient has not yet received colestipol as of this morning.  I do think this will make a difference to her.  I also asked the nurse to encourage the patient to walk and to be as vertical as possible during the day, using the recliner in favor of the bed     Chauncey Cruel, MD 09/12/2020  5:57 PM Medical Oncology and Hematology [facility] [place], [place] [address] Tel. [phone]    Fax. [phone]

## 2020-09-12 NOTE — Progress Notes (Signed)
Pt. Colestipol dose was given this morning. Asked to ambulate or sit in the recliner, pt. replied she will do so latter on the day. Able to sit in the recliner for awhile. Explained the importance of ambulation to the patient, to prevent blood clot. Pt. Uses SCD's while on bed. Heating pad provided for relief of abdominal cramping per pt. Request.

## 2020-09-12 NOTE — Progress Notes (Signed)
Chaplain engaged in an initial visit with Ailene Ravel.  Chaplain learned about Maigen's healthcare journey, her current state of mind and reality, her adventures and time in [place], and her hopes for the future.  Ressa shared that she noticed a lump in her breast while living in [place] and upon saving up the money to get a mammogram, she was told to seek medical attention right away and prompted to be closer to family.  It was when she finally got home to  that she was told the severity of her cancer.  Lajune described the disbelief in finding out that she had cancer.  No one in her family has had breast cancer before.  It took time for Adut and her family to come to terms with the diagnosis especially because of Annalisa's age and healthy lifestyle.  Romana talked about the life of empathy, advocacy work, and being there for others that she has led and the thoughts she has had around how do good people get dealt with such cards as cancer.    Today, Trisa finds herself in a place of peace concerning her diagnosis.  She noted that her scans have been promising.  She also stated that she has come to a place of acceptance and that she recognizes that she is probably the only one in her family that could handle such a diagnosis.  Lauryl has come to a place of knowing that she is deserving of good and bad days.  Some days she is focused and some days she is really tired.  Some days she may just want to cry, but she understands that as normal and apart of her journey now.  She has also spent some time reading to help with her emotions and feelings.  Ivy detailed this time as having to "grow up."  Her mom and boyfriend also provide a lot of support and help to her.    Zoann also spent some time talking about depression and anxiety that she has battled as an adolescent and adult.  She currently isn't on any medication due to chemo and radiation but believes she is in a great place.  Chaplain  recognizes that Eliot seems to be in tune with herself enough to know when she may need help emotionally or mentally.  Annali's mom also works in the mental health counseling field and is able to provide the support and resources that she may need.    Chaplain offered listening, presence, and support.  Chaplain will follow-up.    09/12/20 1500  Clinical Encounter Type  Visited With Patient and family together  Visit Type Initial

## 2020-09-12 NOTE — Progress Notes (Signed)
HPI:  Ellen [name] was previously seen in the [facility] due to a personal and family history of cancer and concerns regarding a hereditary predisposition to cancer. Please refer to our prior cancer genetics clinic note for more information regarding our discussion, assessment and recommendations, at the time. Ellen [name] recent genetic test results were disclosed to her, as were recommendations warranted by these results. These results and recommendations are discussed in more detail below.  CANCER HISTORY:  Oncology History  Metastatic breast cancer ([place])  08/15/2020 Imaging   Large mass in the medial left breast measuring 3.5 cm.  Prominent left axillary lymph node cluster of adjacent lymph nodes, irregular nodule right middle lobe 1.1 cm, aggressive lesion right second rib cortical destruction, multiple lucent lesions throughout the thoracic spine including T1 vertebral body and T2, irregular multilobar lesion central left hepatic lobe 4.3 x 4.7 cm.  Several satellite lesions in the left lateral hepatic lobe.  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 The report date is 09/06/2020.   The CancerNext-Expanded + RNAinsight gene panel offered by [employer] and includes sequencing and rearrangement analysis for the following 77 genes: AIP, ALK, APC, ATM, AXIN2, BAP1, BARD1, BLM, BMPR1A, BRCA1, BRCA2, BRIP1, CDC73, CDH1, CDK4,  CDKN1B, CDKN2A, CHEK2, CTNNA1, DICER1, FANCC, FH, FLCN, GALNT12, KIF1B, LZTR1, MAX, MEN1, MET, MLH1, MSH2, MSH3, MSH6, MUTYH, NBN, NF1, NF2, NTHL1, PALB2, PHOX2B, PMS2, POT1, PRKAR1A, PTCH1, PTEN, RAD51C, RAD51D, RB1, RECQL, RET, SDHA, SDHAF2, SDHB, SDHC, SDHD, SMAD4, SMARCA4, SMARCB1, SMARCE1, STK11, SUFU, TMEM127, TP53, TSC1, TSC2, VHL and XRCC2 (sequencing and deletion/duplication); EGFR, EGLN1, HOXB13, KIT, MITF, PDGFRA, POLD1 and POLE (sequencing only); EPCAM and GREM1 (deletion/duplication only). RNA data is routinely analyzed for use in variant interpretation for all genes.   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 The report date is 09/06/2020.   The CancerNext-Expanded + RNAinsight gene panel offered by [employer] and includes sequencing and rearrangement analysis for the following 77 genes: AIP, ALK, APC, ATM, AXIN2, BAP1, BARD1, BLM, BMPR1A, BRCA1, BRCA2, BRIP1, CDC73, CDH1, CDK4, CDKN1B, CDKN2A, CHEK2, CTNNA1, DICER1, FANCC, FH, FLCN, GALNT12, KIF1B, LZTR1, MAX, MEN1, MET, MLH1, MSH2, MSH3, MSH6, MUTYH, NBN, NF1, NF2, NTHL1, PALB2, PHOX2B, PMS2, POT1, PRKAR1A, PTCH1, PTEN, RAD51C, RAD51D, RB1, RECQL, RET, SDHA, SDHAF2, SDHB, SDHC, SDHD, SMAD4, SMARCA4, SMARCB1, SMARCE1, STK11, SUFU, TMEM127, TP53, TSC1, TSC2, VHL and XRCC2 (sequencing and deletion/duplication); EGFR, EGLN1, HOXB13, KIT, MITF, PDGFRA, POLD1 and POLE (sequencing only); EPCAM and GREM1 (deletion/duplication only). RNA  data is routinely analyzed for use in variant interpretation for all genes.     FAMILY HISTORY:  We obtained a detailed,  4-generation family history.  Significant diagnoses are listed below: Family History  Problem Relation Age of Onset   Depression Mother    Hyperlipidemia Mother    Thyroid cancer Mother 45       papillary thyroid cancer   Hypertension Father    Atrial fibrillation Father    Irritable bowel syndrome Father        also brother   ADD / ADHD Brother    Bipolar disorder Brother    Diabetes Maternal Grandfather    Heart attack Paternal Great-grandfather 56   Pancreatic cancer Maternal Great-grandfather 88       (MGM's father)    Ellen [name] does not have any children. She has one brother (age 32) and one sister (age 63), neither of whom have had cancer.    Ellen [name] mother is alive at age 23 and has a history of papillary thyroid cancer diagnosed at age 17. There are two maternal uncles (one is a half-sibling to her mother). There is no known cancer among maternal uncles or maternal cousins. Ellen [name] maternal grandmother is alive at age 69 without cancer. Her maternal grandfather is alive at age 70 without cancer. A great-grandfather (MGM's father) had pancreatic cancer at age 43.   Ellen [name] father is alive at age 28 without cancer. There are four paternal aunts. There is no known cancer among paternal aunts/uncles or paternal cousins. Ellen [name] paternal grandparents are alive in their 31s without cancer.   Ellen [name] is unaware of previous family history of genetic testing for hereditary cancer risks. Patient's maternal ancestors are of [place], [place], [place], and [place] descent, and paternal ancestors are of [place] but otherwise unknown descent. There is no reported Ashkenazi Jewish ancestry. There is no known consanguinity.  GENETIC TEST RESULTS: Genetic testing reported out on 09/06/2020 through the [facility] + RNAinsight panel. No pathogenic variants were detected.   The CancerNext-Expanded + RNAinsight gene panel offered by [employer]  and includes sequencing and rearrangement analysis for the following 77 genes: AIP, ALK, APC, ATM, AXIN2, BAP1, BARD1, BLM, BMPR1A, BRCA1, BRCA2, BRIP1, CDC73, CDH1, CDK4, CDKN1B, CDKN2A, CHEK2, CTNNA1, DICER1, FANCC, FH, FLCN, GALNT12, KIF1B, LZTR1, MAX, MEN1, MET, MLH1, MSH2, MSH3, MSH6, MUTYH, NBN, NF1, NF2, NTHL1, PALB2, PHOX2B, PMS2, POT1, PRKAR1A, PTCH1, PTEN, RAD51C, RAD51D, RB1, RECQL, RET, SDHA, SDHAF2, SDHB, SDHC, SDHD, SMAD4, SMARCA4, SMARCB1, SMARCE1, STK11, SUFU, TMEM127, TP53, TSC1, TSC2, VHL and XRCC2 (sequencing and deletion/duplication); EGFR, EGLN1, HOXB13, KIT, MITF, PDGFRA, POLD1 and POLE (sequencing only); EPCAM and GREM1 (deletion/duplication only). RNA data is routinely analyzed for use in variant interpretation for all genes. The test report will be scanned into EPIC and located under the Molecular Pathology section of the Results Review tab.  A portion of the result report is included below for reference.     We discussed with Ellen [name] that because current genetic testing is not perfect, it is possible there may be a gene mutation in one of these genes that current testing cannot detect, but that chance is small.  We also discussed that there could be another gene that has not yet been discovered, or that we have not yet tested, that is responsible for the cancer diagnoses in the family. It is also possible there is a hereditary cause for the cancer in the family that Ellen [name] did not inherit and  therefore was not identified in her testing. Therefore, it is important to remain in touch with cancer genetics in the future so that we can continue to offer Ellen [name] the most up to date genetic testing.   ADDITIONAL GENETIC TESTING: We discussed with Ellen [name] that her genetic testing was fairly extensive.  If there are genes identified to increase cancer risk that can be analyzed in the future, we would be happy to discuss and coordinate this testing at that time.    CANCER  SCREENING RECOMMENDATIONS: Ellen [name] test result is considered negative (normal). This means that we have not identified a hereditary cause for her personal and family history of cancer at this time. This does not definitively rule out a hereditary predisposition to cancer. It is still possible that there could be genetic mutations that are undetectable by current technology. There could be genetic mutations in genes that have not been tested or identified to increase cancer risk.  Therefore, it is recommended she continue to follow the cancer management and screening guidelines provided by her oncology and primary healthcare provider.   An individual's cancer risk and medical management are not determined by genetic test results alone. Overall cancer risk assessment incorporates additional factors, including personal medical history, family history, and any available genetic information that may result in a personalized plan for cancer prevention and surveillance.  RECOMMENDATIONS FOR FAMILY MEMBERS:  Individuals in this family might be at some increased risk of developing cancer, over the general population risk, simply due to the family history of cancer. We recommended women in this family have a yearly mammogram beginning at age 85, or 69 years younger than the earliest onset of cancer, an annual clinical breast exam, and perform monthly breast self-exams. Women in this family should also have a gynecological exam as recommended by their primary provider. All family members should be referred for colonoscopy starting at age 23.  FOLLOW-UP: Lastly, we discussed with Ellen [name] that cancer genetics is a rapidly advancing field and it is possible that new genetic tests will be appropriate for her and/or her family members in the future. We encouraged her to remain in contact with cancer genetics on an annual basis so we can update her personal and family histories and let her know of advances in cancer  genetics that may benefit this family.   Our contact number was provided. Ellen [name] questions were answered to her satisfaction, and she knows she is welcome to call us at anytime with additional questions or concerns.   Clint Guy, MS, [facility] Genetic Counselor [email or web address] Phone: [phone]

## 2020-09-12 NOTE — Telephone Encounter (Signed)
Revealed negative genetic testing. Discussed that we do not know why she has breast cancer or why there is cancer in the family. It is possible that there could be a mutation in a different gene that we are not testing, or our current technology may not be able to detect certain mutations. It will therefore be important for her to stay in contact with genetics to keep up with whether additional testing may be appropriate in the future.

## 2020-09-12 NOTE — Progress Notes (Signed)
PROGRESS NOTE    SHEVAWN HOFER  [id number] DOB: 1995-12-04 DOA: 09/09/2020 PCP: Merryl Hacker, No     Brief Narrative:  Ellen [name] is a 25 year old female with PMH significant for carcinoma of left breast, ADHD, anxiety who presented to the ED with complaints of persistent nausea, vomiting and diarrhea.  Patient has recently completed 10-day course of chemotherapy.  During this time she has developed diarrhea intermittently, but it became persistent for last 4 days. Patient also reported during this time, she had 2 rounds of chemo with the most recent being 2 days ago. She has developed persistent nausea and vomiting and decreased p.o. intake. She was unable to keep anything down. She was found to be in sinus tachycardia, she was given IV fluids which improved the heart rate. Serum creatinine found to be 3.36 which is improved with IV hydration.  Patient is found to be neutropenic but afebrile.  New events last 24 hours / Subjective: Still with very poor appetite, has not really drink anything.  Continues to have vomiting and diarrhea.  Assessment & Plan:   Active Problems:   AKI (acute kidney injury) ([facility])   Intractable nausea, vomiting and diarrhea in the setting of chemotherapy for left breast cancer: Patient recently has completed 10-day course of chemotherapy. She has developed persistent nausea, vomiting and diarrhea during that course. Continue IV fluids, scopolamine, Ativan and Protonix as needed. Started on clear liquid diet, will advance as tolerated. Stool for C Diff : Negative, Continue Imodium as needed. Heme states she is having Perjeta associated diarrhea , typhlitis unlikely given no leukocytosis, remains afebrile. Treatment is mainly supportive,  continue Imodium, colestipol or Questran   AKI secondary to dehydration:  Resolved    Hypokalemia: Replace   Hyponatremia: Improving    Prolonged QT interval: Avoid Zofran and Reglan. Repeat EKG normalization  of QT prolongation.   History of breast cancer on chemo: Followed by Dr. Lindi Adie    Leukopenia/ thrombocytopenia/neutropenia: Improved. Plt remain low.    DVT prophylaxis:  SCDs Start: 09/10/20 0013  Code Status:     Code Status Orders  (From admission, onward)           Start     Ordered   09/10/20 0013  Full code  Continuous        09/10/20 0012           Code Status History     This patient has a current code status but no historical code status.      Family Communication: None Disposition Plan:  Status is: Inpatient  Remains inpatient appropriate because:IV treatments appropriate due to intensity of illness or inability to take PO and Inpatient level of care appropriate due to severity of illness  Dispo: The patient is from: Home              Anticipated d/c is to: Home              Patient currently is not medically stable to d/c.   Difficult to place patient No      Consultants:  Oncology   Procedures:  None   Antimicrobials:  Anti-infectives (From admission, onward)    None        Objective: Vitals:   09/11/20 2033 09/11/20 2042 09/11/20 2042 09/12/20 0632  BP: 124/84 128/89 133/85 140/89  Pulse: (!) 110 (!) 112 (!) 104 (!) 104  Resp: '20 20 20 20  '$ Temp: 98.2 F (36.8 C) 99.5 F (37.5  C) 99.5 F (37.5 C) 99.1 F (37.3 C)  TempSrc: Oral Oral  Oral  SpO2: 99% 98% 98% 99%    Intake/Output Summary (Last 24 hours) at 09/12/2020 1318 Last data filed at 09/11/2020 1900 Gross per 24 hour  Intake 1385.4 ml  Output 2 ml  Net 1383.4 ml   There were no vitals filed for this visit.  Examination:  General exam: Appears calm and comfortable, weak appearing  Respiratory system: Clear to auscultation. Respiratory effort normal. No respiratory distress. No conversational dyspnea.  Cardiovascular system: S1 & S2 heard, tachycardic, regular rhythm. No murmurs. No pedal edema. Gastrointestinal system: Abdomen is nondistended, soft and  nontender. Normal bowel sounds heard. Central nervous system: Alert and oriented. No focal neurological deficits. Speech clear.  Extremities: Symmetric in appearance  Skin: No rashes, lesions or ulcers on exposed skin  Psychiatry: Judgement and insight appear normal. Mood & affect appropriate.   Data Reviewed: I have personally reviewed following labs and imaging studies  CBC: Recent Labs  Lab 09/09/20 1438 09/10/20 0503 09/11/20 0452 09/12/20 0525  WBC 1.3* 1.6* 6.2 8.7  NEUTROABS 0.7*  --  4.4 6.7  HGB 16.1* 12.1 12.7 11.3*  HCT 43.8 33.6* 35.7* 32.4*  MCV 82.2 83.4 84.4 85.7  PLT 120* 99* 72* 62*   Basic Metabolic Panel: Recent Labs  Lab 09/09/20 1438 09/10/20 0503 09/11/20 0452 09/12/20 0525  NA 126* 124* 127* 130*  K 3.3* 3.6 3.7 3.0*  CL 87* 93* 95* 95*  CO2 19* 20* 19* 22  GLUCOSE 139* 93 100* 98  BUN 36* '19 14 8  '$ CREATININE 3.39* 0.92 0.55 0.50  CALCIUM 7.6* 7.4* 7.5* 7.7*  MG 2.3  --  2.6* 2.3  PHOS  --   --  2.0* 1.8*   GFR: Estimated Creatinine Clearance: 96.7 mL/min (by C-G formula based on SCr of 0.5 mg/dL). Liver Function Tests: Recent Labs  Lab 09/09/20 1438 09/10/20 0503 09/11/20 1330 09/12/20 0525  AST '19 16 19 22  '$ ALT '29 22 20 22  '$ ALKPHOS 87 69 62 63  BILITOT 3.9* 4.2* 5.1* 5.0*  PROT 7.2 5.4* 4.8* 4.6*  ALBUMIN 3.2* 2.2* 1.8* 1.8*   Recent Labs  Lab 09/09/20 1438  LIPASE 15   No results for input(s): AMMONIA in the last 168 hours. Coagulation Profile: No results for input(s): INR, PROTIME in the last 168 hours. Cardiac Enzymes: No results for input(s): CKTOTAL, CKMB, CKMBINDEX, TROPONINI in the last 168 hours. BNP (last 3 results) No results for input(s): PROBNP in the last 8760 hours. HbA1C: No results for input(s): HGBA1C in the last 72 hours. CBG: No results for input(s): GLUCAP in the last 168 hours. Lipid Profile: No results for input(s): CHOL, HDL, LDLCALC, TRIG, CHOLHDL, LDLDIRECT in the last 72 hours. Thyroid  Function Tests: No results for input(s): TSH, T4TOTAL, FREET4, T3FREE, THYROIDAB in the last 72 hours. Anemia Panel: No results for input(s): VITAMINB12, FOLATE, FERRITIN, TIBC, IRON, RETICCTPCT in the last 72 hours. Sepsis Labs: No results for input(s): PROCALCITON, LATICACIDVEN in the last 168 hours.  Recent Results (from the past 240 hour(s))  Resp Panel by RT-PCR (Flu A&B, Covid) Nasopharyngeal Swab     Status: None   Collection Time: 09/09/20  5:25 PM   Specimen: Nasopharyngeal Swab; Nasopharyngeal(NP) swabs in vial transport medium  Result Value Ref Range Status   SARS Coronavirus 2 by RT PCR NEGATIVE NEGATIVE Final    Comment: (NOTE) SARS-CoV-2 target nucleic acids are NOT DETECTED.  The SARS-CoV-2 RNA  is generally detectable in upper respiratory specimens during the acute phase of infection. The lowest concentration of SARS-CoV-2 viral copies this assay can detect is 138 copies/mL. A negative result does not preclude SARS-Cov-2 infection and should not be used as the sole basis for treatment or other patient management decisions. A negative result may occur with  improper specimen collection/handling, submission of specimen other than nasopharyngeal swab, presence of viral mutation(s) within the areas targeted by this assay, and inadequate number of viral copies(<138 copies/mL). A negative result must be combined with clinical observations, patient history, and epidemiological information. The expected result is Negative.  Fact Sheet for Patients:  EntrepreneurPulse.com.au  Fact Sheet for Healthcare Providers:  IncredibleEmployment.be  This test is no t yet approved or cleared by the [place] FDA and  has been authorized for detection and/or diagnosis of SARS-CoV-2 by FDA under an Emergency Use Authorization (EUA). This EUA will remain  in effect (meaning this test can be used) for the duration of the COVID-19 declaration under  Section 564(b)(1) of the Act, 21 U.S.C.section 360bbb-3(b)(1), unless the authorization is terminated  or revoked sooner.       Influenza A by PCR NEGATIVE NEGATIVE Final   Influenza B by PCR NEGATIVE NEGATIVE Final    Comment: (NOTE) The Xpert Xpress SARS-CoV-2/FLU/RSV plus assay is intended as an aid in the diagnosis of influenza from Nasopharyngeal swab specimens and should not be used as a sole basis for treatment. Nasal washings and aspirates are unacceptable for Xpert Xpress SARS-CoV-2/FLU/RSV testing.  Fact Sheet for Patients: EntrepreneurPulse.com.au  Fact Sheet for Healthcare Providers: IncredibleEmployment.be  This test is not yet approved or cleared by the [place] FDA and has been authorized for detection and/or diagnosis of SARS-CoV-2 by FDA under an Emergency Use Authorization (EUA). This EUA will remain in effect (meaning this test can be used) for the duration of the COVID-19 declaration under Section 564(b)(1) of the Act, 21 U.S.C. section 360bbb-3(b)(1), unless the authorization is terminated or revoked.  Performed at [facility], [place] [address], [place], [place] [address]   C Difficile Quick Screen w PCR reflex     Status: None   Collection Time: 09/10/20 12:43 PM   Specimen: STOOL  Result Value Ref Range Status   C Diff antigen NEGATIVE NEGATIVE Final   C Diff toxin NEGATIVE NEGATIVE Final   C Diff interpretation No C. difficile detected.  Final    Comment: Performed at [facility], [place] [address], [place],  [address]      Radiology Studies: CT ABDOMEN PELVIS W CONTRAST  Result Date: 09/11/2020 CLINICAL DATA:  Diarrhea, nausea and vomiting, recent chemotherapy for known left breast carcinoma. EXAM: CT ABDOMEN AND PELVIS WITH CONTRAST TECHNIQUE: Multidetector CT imaging of the abdomen and pelvis was performed using the standard protocol following bolus administration  of intravenous contrast. CONTRAST:  27m OMNIPAQUE IOHEXOL 350 MG/ML SOLN COMPARISON:  CT 08/15/2020 FINDINGS: Lower chest: Redemonstration of a left breast mass only partially visualized on this exam though suspect some interval decrease in size from comparison measuring 2.9 x 1.2 cm in maximal transaxial dimension within the level of imaging. Stable small ground-glass nodule seen in the anterior right middle lobe measuring 4 mm in size (4/39). No other visible concerning pulmonary nodules within the margins of imaging. Circumferential thickening and fluid-filled appearance of the distal thoracic esophagus. Normal heart size. No pericardial effusion. Terminus of a central venous catheter at the level of the right atrium. Hepatobiliary:  Redemonstration of the multilobular, heterogeneous, hypoechoic focus in the anterior left lobe liver measuring 3.5 x 2.9 cm in size, decreased from 4.9 x 4.3 cm on comparison imaging when measured at a similar level. Additional smaller hypoattenuating foci seen in the anterior left and right lobes liver are similar to decreased size from comparison prior as well though incompletely characterized on this exam. Normal background parenchymal attenuation enhancement. Smooth surface contour. Normal gallbladder and biliary tree. Pancreas: No pancreatic ductal dilatation or surrounding inflammatory changes. Spleen: Normal in size. No concerning splenic lesions. Adrenals/Urinary Tract: Normal adrenals. Kidneys are normally located with symmetric enhancement. No suspicious renal lesion, urolithiasis or hydronephrosis. Urinary bladder is unremarkable the degree of distention. Stomach/Bowel: Circumferentially thickened and fluid-filled distal thoracic esophagus with some mild mucosal hyperemia. Stomach is moderately fluid distended as well with some additional questionable thickening and mucosal hyperemia towards the distal body and antrum which may be accentuated by peristalsis. Duodenum and  multiple sites throughout the small bowel demonstrate segmental regions of edematous mural thickening and mucosal hyperenhancement with generalized air and fluid distention without discrete focal transition point. Slightly more pronounced edematous changes and inflammation seen towards the terminal ileum as well. The colon is also diffusely fluid distended with some fairly focal thickening noted at the level of the rectosigmoid with a notable lack of formed stool. Vascular/Lymphatic: Central mesenteric nodes appear largely reactive without pathologically enlarged abdominopelvic adenopathy. No concerning vascular findings. Reproductive: Anteverted uterus with expected positioning of an IUD. No concerning adnexal mass or lesion. Other: Mild edematous changes of the mesenteric loops associated with segmentally thickened regions of small bowel including the duodenum, mid to distal small bowel and terminal ileum as well as the rectosigmoid. Small volume of free intraperitoneal fluid which is low-attenuation, favored to be reactive. No organized abscess or collection. No free air. No pneumatosis or portal venous gas. No bowel containing hernia. Musculoskeletal: Diffuse bony metastatic disease throughout the imaged spine, pelvis and proximal femora including several larger expansile lesions for instance involving the left ilium which appears perhaps slightly increased in size from comparison prior measuring 2.4 x 2.6 cm, previously 2.1 x 2.3 cm. Several lesions demonstrate some increasingly sclerotic change, possibly treatment related effect. There is a pathologic compression deformity noted at the L3 level with increasing height loss, 50% centrally. Additional superior endplate compression deformity at the L4 level with up to 30% height loss centrally. Some apparent moderate canal narrowing at the L3 level secondary to posterior retropulsion. IMPRESSION: Diffuse fluid distention of both large and small bowel with  multifocal regions of long segmental edematous mural thickening and mucosal hyperemia concerning for an enterocolitis of infectious, or inflammatory etiology. No evidence of bowel obstruction or perforation at this time. Redemonstration of a left breast mass only partially included within the margins of imaging. Decreasing size of the conspicuous nodular hypoattenuating lesion in the anterior left lobe liver compatible with some treatment response. Osseous metastatic disease appears similar to progressive from prior with several lesions appearing increased in size as well as worsening pathologic compression deformities L3, L4 with retropulsion at the L3 level resulting in moderate canal stenosis. Stable 4 mm ground-glass nodule in the right middle lobe, indeterminate. Electronically Signed   By: Lovena Le M.D.   On: 09/11/2020 00:07      Scheduled Meds:  chlorhexidine  15 mL Mouth Rinse BID   Chlorhexidine Gluconate Cloth  6 each Topical Daily   colestipol  1 g Oral BID   LORazepam  1 mg  Intravenous QHS   mouth rinse  15 mL Mouth Rinse q12n4p   octreotide  100 mcg Subcutaneous Q12H   pantoprazole (PROTONIX) IV  40 mg Intravenous Q24H   scopolamine  1 patch Transdermal Q72H   Continuous Infusions:  lactated ringers with kcl 100 mL/hr at 09/12/20 0924   potassium chloride 10 mEq (09/12/20 1239)     LOS: 3 days      Time spent: 30 minutes   Dessa Phi, DO [facility] 09/12/2020, 1:18 PM   Available via Epic secure chat 7am-7pm After these hours, please refer to coverage provider listed on [email or web address]

## 2020-09-13 LAB — BASIC METABOLIC PANEL
Anion gap: 9 (ref 5–15)
BUN: 7 mg/dL (ref 6–20)
CO2: 22 mmol/L (ref 22–32)
Calcium: 7.4 mg/dL — ABNORMAL LOW (ref 8.9–10.3)
Chloride: 98 mmol/L (ref 98–111)
Creatinine, Ser: 0.45 mg/dL (ref 0.44–1.00)
GFR, Estimated: >60 mL/min (ref >60)
Glucose, Bld: 98 mg/dL (ref 70–99)
Potassium: 3 mmol/L — ABNORMAL LOW (ref 3.5–5.1)
Sodium: 129 mmol/L — ABNORMAL LOW (ref 135–145)

## 2020-09-13 LAB — CBC
HCT: 30.7 % — ABNORMAL LOW (ref 36.0–46.0)
Hemoglobin: 10.6 g/dL — ABNORMAL LOW (ref 12.0–15.0)
MCH: 29.9 pg (ref 26.0–34.0)
MCHC: 34.5 g/dL (ref 30.0–36.0)
MCV: 86.7 fL (ref 80.0–100.0)
Platelets: 81 10*3/uL — ABNORMAL LOW (ref 150–400)
RBC: 3.54 MIL/uL — ABNORMAL LOW (ref 3.87–5.11)
RDW: 12.5 % (ref 11.5–15.5)
WBC: 12.2 10*3/uL — ABNORMAL HIGH (ref 4.0–10.5)
nRBC: 0.2 % (ref 0.0–0.2)

## 2020-09-13 LAB — MAGNESIUM: Magnesium: 2.1 mg/dL (ref 1.7–2.4)

## 2020-09-13 MED ORDER — DIPHENHYDRAMINE HCL 25 MG PO CAPS
25.0000 mg | ORAL_CAPSULE | Freq: Once | ORAL | Status: AC
  Administered 2020-09-13: 25 mg via ORAL
  Filled 2020-09-13: qty 1

## 2020-09-13 MED ORDER — DEXTROSE 5 % IV SOLN
5.0000 mg/kg | Freq: Three times a day (TID) | INTRAVENOUS | Status: AC
  Administered 2020-09-13 – 2020-09-17 (×13): 315 mg via INTRAVENOUS
  Filled 2020-09-13 (×14): qty 6.3

## 2020-09-13 MED ORDER — POTASSIUM CHLORIDE 10 MEQ/100ML IV SOLN
10.0000 meq | INTRAVENOUS | Status: AC
Start: 2020-09-13 — End: 2020-09-13
  Administered 2020-09-13 (×4): 10 meq via INTRAVENOUS
  Filled 2020-09-13 (×4): qty 100

## 2020-09-13 MED ORDER — LIDOCAINE-PRILOCAINE 2.5-2.5 % EX CREA
TOPICAL_CREAM | Freq: Once | CUTANEOUS | Status: AC
  Filled 2020-09-13: qty 5

## 2020-09-13 MED ORDER — SALINE SPRAY 0.65 % NA SOLN
1.0000 | NASAL | Status: DC | PRN
  Administered 2020-09-13: 1 via NASAL
  Filled 2020-09-13: qty 44

## 2020-09-13 NOTE — Progress Notes (Signed)
Ellen [name]   DOB:07/30/1995   JZ:[id number]   [id number]  Subjective:  Ellen [name] is feeling improved this morning.  She notes that she has been able to keep down fluids today, which is better than yesterday.  She has continued to be unable to tolerate anything more than water today as it often feels as if it is burning her tongue and throat.  She received Colestipol today at 9 am and imodium at 1pm.  She does not like the octreotide injections because they sting going in.  She would really like to stop receiving these.  She is also having continued diarrhea, and says her diarrhea is maybe 10% better at most.  She has been having difficulty sleeping at night, and notes that she just asked for some benadryl so she could take a nap.  She is getting some potassium replacement today due to hypokalemia.    Ellen [name] does note a genital herpes lesion.     Objective: white woman examined in bed Vitals:   09/13/20 0408 09/13/20 1345  BP: (!) 133/92 116/72  Pulse: (!) 108 (!) 108  Resp: 15   Temp: 98.3 F (36.8 C) 99.5 F (37.5 C)  SpO2: 99% 98%    There is no height or weight on file to calculate BMI.  Intake/Output Summary (Last 24 hours) at 09/13/2020 1617 Last data filed at 09/13/2020 1500 Gross per 24 hour  Intake 1838.86 ml  Output --  Net 1838.86 ml   GENERAL: Patient awake sitting up in bed HEENT:  Sclerae anicteric.  Oropharynx clear and moist. No ulcerations or evidence of oropharyngeal candidiasis. Neck is supple.  NODES:  No cervical, supraclavicular, or axillary lymphadenopathy palpated.  LUNGS:  Clear to auscultation bilaterally.  No wheezes or rhonchi. HEART:  Regular rate and rhythm. No murmur appreciated. ABDOMEN:  Soft, nontender.  Positive, normoactive bowel sounds. No organomegaly palpated. EXTREMITIES:  No peripheral edema.   SKIN:  Clear with no obvious rashes or skin changes. No nail dyscrasia. NEURO:  Nonfocal. Well oriented.  Appropriate affect.   CBG  (last 3)  No results for input(s): GLUCAP in the last 72 hours.   Labs:  Lab Results  Component Value Date   WBC 12.2 (H) 09/13/2020   HGB 10.6 (L) 09/13/2020   HCT 30.7 (L) 09/13/2020   MCV 86.7 09/13/2020   PLT 81 (L) 09/13/2020   NEUTROABS 6.7 09/12/2020    '@LASTCHEMISTRY'$ @  Urine Studies No results for input(s): UHGB, CRYS in the last 72 hours.  Invalid input(s): UACOL, UAPR, USPG, UPH, UTP, UGL, UKET, UBIL, UNIT, UROB, [place], UEPI, UWBC, [place], [place], [place], [place], [place]  Basic Metabolic Panel: Recent Labs  Lab 09/09/20 1438 09/10/20 0503 09/11/20 0452 09/12/20 0525 09/13/20 0418  NA 126* 124* 127* 130* 129*  K 3.3* 3.6 3.7 3.0* 3.0*  CL 87* 93* 95* 95* 98  CO2 19* 20* 19* 22 22  GLUCOSE 139* 93 100* 98 98  BUN 36* '19 14 8 7  '$ CREATININE 3.39* 0.92 0.55 0.50 0.45  CALCIUM 7.6* 7.4* 7.5* 7.7* 7.4*  MG 2.3  --  2.6* 2.3 2.1  PHOS  --   --  2.0* 1.8*  --     GFR Estimated Creatinine Clearance: 96.7 mL/min (by C-G formula based on SCr of 0.45 mg/dL). Liver Function Tests: Recent Labs  Lab 09/09/20 1438 09/10/20 0503 09/11/20 1330 09/12/20 0525  AST '19 16 19 22  '$ ALT '29 22 20 22  '$ ALKPHOS 87 69 62 63  BILITOT  3.9* 4.2* 5.1* 5.0*  PROT 7.2 5.4* 4.8* 4.6*  ALBUMIN 3.2* 2.2* 1.8* 1.8*    Recent Labs  Lab 09/09/20 1438  LIPASE 15    No results for input(s): AMMONIA in the last 168 hours. Coagulation profile No results for input(s): INR, PROTIME in the last 168 hours.  CBC: Recent Labs  Lab 09/09/20 1438 09/10/20 0503 09/11/20 0452 09/12/20 0525 09/13/20 0418  WBC 1.3* 1.6* 6.2 8.7 12.2*  NEUTROABS 0.7*  --  4.4 6.7  --   HGB 16.1* 12.1 12.7 11.3* 10.6*  HCT 43.8 33.6* 35.7* 32.4* 30.7*  MCV 82.2 83.4 84.4 85.7 86.7  PLT 120* 99* 72* 62* 81*    Cardiac Enzymes: No results for input(s): CKTOTAL, CKMB, CKMBINDEX, TROPONINI in the last 168 hours. BNP: Invalid input(s): POCBNP CBG: No results for input(s): GLUCAP in the last 168  hours. D-Dimer No results for input(s): DDIMER in the last 72 hours. Hgb A1c No results for input(s): HGBA1C in the last 72 hours. Lipid Profile No results for input(s): CHOL, HDL, LDLCALC, TRIG, CHOLHDL, LDLDIRECT in the last 72 hours. Thyroid function studies No results for input(s): TSH, T4TOTAL, T3FREE, THYROIDAB in the last 72 hours.  Invalid input(s): FREET3 Anemia work up No results for input(s): VITAMINB12, FOLATE, FERRITIN, TIBC, IRON, RETICCTPCT in the last 72 hours. Microbiology Recent Results (from the past 240 hour(s))  Resp Panel by RT-PCR (Flu A&B, Covid) Nasopharyngeal Swab     Status: None   Collection Time: 09/09/20  5:25 PM   Specimen: Nasopharyngeal Swab; Nasopharyngeal(NP) swabs in vial transport medium  Result Value Ref Range Status   SARS Coronavirus 2 by RT PCR NEGATIVE NEGATIVE Final    Comment: (NOTE) SARS-CoV-2 target nucleic acids are NOT DETECTED.  The SARS-CoV-2 RNA is generally detectable in upper respiratory specimens during the acute phase of infection. The lowest concentration of SARS-CoV-2 viral copies this assay can detect is 138 copies/mL. A negative result does not preclude SARS-Cov-2 infection and should not be used as the sole basis for treatment or other patient management decisions. A negative result may occur with  improper specimen collection/handling, submission of specimen other than nasopharyngeal swab, presence of viral mutation(s) within the areas targeted by this assay, and inadequate number of viral copies(<138 copies/mL). A negative result must be combined with clinical observations, patient history, and epidemiological information. The expected result is Negative.  Fact Sheet for Patients:  EntrepreneurPulse.com.au  Fact Sheet for Healthcare Providers:  IncredibleEmployment.be  This test is no t yet approved or cleared by the [place] FDA and  has been authorized for detection  and/or diagnosis of SARS-CoV-2 by FDA under an Emergency Use Authorization (EUA). This EUA will remain  in effect (meaning this test can be used) for the duration of the COVID-19 declaration under Section 564(b)(1) of the Act, 21 U.S.C.section 360bbb-3(b)(1), unless the authorization is terminated  or revoked sooner.       Influenza A by PCR NEGATIVE NEGATIVE Final   Influenza B by PCR NEGATIVE NEGATIVE Final    Comment: (NOTE) The Xpert Xpress SARS-CoV-2/FLU/RSV plus assay is intended as an aid in the diagnosis of influenza from Nasopharyngeal swab specimens and should not be used as a sole basis for treatment. Nasal washings and aspirates are unacceptable for Xpert Xpress SARS-CoV-2/FLU/RSV testing.  Fact Sheet for Patients: EntrepreneurPulse.com.au  Fact Sheet for Healthcare Providers: IncredibleEmployment.be  This test is not yet approved or cleared by the [place] FDA and has been authorized for detection and/or  diagnosis of SARS-CoV-2 by FDA under an Emergency Use Authorization (EUA). This EUA will remain in effect (meaning this test can be used) for the duration of the COVID-19 declaration under Section 564(b)(1) of the Act, 21 U.S.C. section 360bbb-3(b)(1), unless the authorization is terminated or revoked.  Performed at [facility], [place] [address] [place], [place] [address]   C Difficile Quick Screen w PCR reflex     Status: None   Collection Time: 09/10/20 12:43 PM   Specimen: STOOL  Result Value Ref Range Status   C Diff antigen NEGATIVE NEGATIVE Final   C Diff toxin NEGATIVE NEGATIVE Final   C Diff interpretation No C. difficile detected.  Final    Comment: Performed at [facility], [place] [address] [place], [place] [address]      Studies:  No results found.  Assessment: 25 y.o. [place] woman with a triple-positive invasive ductal carcinoma presenting June 2022 with stage  IV disease (bones, liver, possibly lung, as well as left breast and regional adenopathy)  (A) s/p palliative radiation to lumbar spine and right pelvis completed 08/26/2020  (B) s/p cycle 1 Herceptin/Perjeta (08/31/2020) and docetaxel (09/04/2020) -- did not receive carboplatin or pegfilgrastim  (C) on goserelin Q 28 days, most recent dose 08/17/2020  (D) admitted with uncontrolled diarrhea, nausea/vomiting, transient neutropenia    Plan:  Ellen [name] has continued to have persistent diarrhea.  Due to this, and her CT results, Dr. Jana Hakim and I reviewed her case with Dr. Burna Mortimer in GI.  She has graciously agreed to consult on the patient to give [place] any recommendations she may have.  Her WBC have improved.    Ellen [name] does not think she will be able to tolerate the oral Valtrex for her genital herpes.  I discussed with the infectious disease pharmacist, who placed orders for IV acyclovir dosed at '5mg'$ /kg every 8 hours.  Once her stomatitis has improved she can likely tolerate oral valtrex instead.    We recommend continued IV hydration and electrolyte replacement and await Dr. Zena Amos recommendations.    Ellen Bihari, NP 09/13/20 4:26 PM Medical Oncology and Hematology [facility] [place], [place] [address] Tel. [phone]    Fax. [phone]

## 2020-09-13 NOTE — Progress Notes (Signed)
PROGRESS NOTE    BYANKA MULE  [id number] DOB: Mar 22, 1995 DOA: 09/09/2020 PCP: Merryl Hacker, No     Brief Narrative:  Ellen [name] is a 25 year old female with PMH significant for carcinoma of left breast, ADHD, anxiety who presented to the ED with complaints of persistent nausea, vomiting and diarrhea.  Patient has recently completed 10-day course of chemotherapy.  During this time she has developed diarrhea intermittently, but it became persistent for last 4 days. Patient also reported during this time, she had 2 rounds of chemo with the most recent being 2 days ago. She has developed persistent nausea and vomiting and decreased p.o. intake. She was unable to keep anything down. She was found to be in sinus tachycardia, she was given IV fluids which improved the heart rate. Serum creatinine found to be 3.36 which is improved with IV hydration.  Patient is found to be neutropenic but afebrile.  New events last 24 hours / Subjective: Continues to have diarrhea, but fortunately able to tolerate clear liquids.  Did not sleep much, asking for another dose of Benadryl to get some rest.  Remains very unsteady on her feet.  Assessment & Plan:   Active Problems:   AKI (acute kidney injury) ([place])   Intractable nausea, vomiting and diarrhea in the setting of chemotherapy for left breast cancer: Patient recently has completed 10-day course of chemotherapy. She has developed persistent nausea, vomiting and diarrhea during that course. Continue IV fluids, scopolamine, Ativan and Protonix as needed. Started on clear liquid diet, will advance as tolerated. Stool for C Diff : Negative, Continue Imodium as needed. Heme states she is having Perjeta associated diarrhea , typhlitis unlikely given no leukocytosis, remains afebrile. Treatment is mainly supportive,  continue Imodium, colestipol or Questran   AKI secondary to dehydration:  Resolved    Hypokalemia: Replace    Hyponatremia: Improved   Prolonged QT interval: Avoid Zofran and Reglan. Repeat EKG normalization of QT prolongation.   History of breast cancer on chemo: Followed by Dr. Lindi Adie    Leukopenia/ thrombocytopenia/neutropenia: Improved. Plt remain low.    DVT prophylaxis:  SCDs Start: 09/10/20 0013  Code Status:     Code Status Orders  (From admission, onward)           Start     Ordered   09/10/20 0013  Full code  Continuous        09/10/20 0012           Code Status History     This patient has a current code status but no historical code status.      Family Communication: Family member sleeping at bedside Disposition Plan:  Status is: Inpatient  Remains inpatient appropriate because:IV treatments appropriate due to intensity of illness or inability to take PO and Inpatient level of care appropriate due to severity of illness  Dispo: The patient is from: Home              Anticipated d/c is to: Home              Patient currently is not medically stable to d/c.   Difficult to place patient No      Consultants:  Oncology   Procedures:  None   Antimicrobials:  Anti-infectives (From admission, onward)    None        Objective: Vitals:   09/12/20 0632 09/12/20 1431 09/12/20 1939 09/13/20 0408  BP: 140/89 130/89 124/82 (!) 133/92  Pulse: (!) 104 Marland Kitchen)  109 (!) 105 (!) 108  Resp: '20 20 18 15  '$ Temp: 99.1 F (37.3 C) 98.7 F (37.1 C) 98.3 F (36.8 C) 98.3 F (36.8 C)  TempSrc: Oral Oral Oral Oral  SpO2: 99% 100% 99% 99%    Intake/Output Summary (Last 24 hours) at 09/13/2020 1222 Last data filed at 09/12/2020 1820 Gross per 24 hour  Intake 1606.64 ml  Output --  Net 1606.64 ml    There were no vitals filed for this visit. Examination: General exam: Appears calm and comfortable, weak and fatigued appearing Respiratory system: Clear to auscultation. Respiratory effort normal. Cardiovascular system: S1 & S2 heard, tachycardic, regular  rhythm. No pedal edema. Gastrointestinal system: Abdomen is nondistended, soft and nontender. Normal bowel sounds heard. Central nervous system: Alert and oriented. Non focal exam. Speech clear  Extremities: Symmetric in appearance bilaterally  Skin: No rashes, lesions or ulcers on exposed skin  Psychiatry: Judgement and insight appear stable. Mood & affect appropriate.    Data Reviewed: I have personally reviewed following labs and imaging studies  CBC: Recent Labs  Lab 09/09/20 1438 09/10/20 0503 09/11/20 0452 09/12/20 0525 09/13/20 0418  WBC 1.3* 1.6* 6.2 8.7 12.2*  NEUTROABS 0.7*  --  4.4 6.7  --   HGB 16.1* 12.1 12.7 11.3* 10.6*  HCT 43.8 33.6* 35.7* 32.4* 30.7*  MCV 82.2 83.4 84.4 85.7 86.7  PLT 120* 99* 72* 62* 81*    Basic Metabolic Panel: Recent Labs  Lab 09/09/20 1438 09/10/20 0503 09/11/20 0452 09/12/20 0525 09/13/20 0418  NA 126* 124* 127* 130* 129*  K 3.3* 3.6 3.7 3.0* 3.0*  CL 87* 93* 95* 95* 98  CO2 19* 20* 19* 22 22  GLUCOSE 139* 93 100* 98 98  BUN 36* '19 14 8 7  '$ CREATININE 3.39* 0.92 0.55 0.50 0.45  CALCIUM 7.6* 7.4* 7.5* 7.7* 7.4*  MG 2.3  --  2.6* 2.3 2.1  PHOS  --   --  2.0* 1.8*  --     GFR: Estimated Creatinine Clearance: 96.7 mL/min (by C-G formula based on SCr of 0.45 mg/dL). Liver Function Tests: Recent Labs  Lab 09/09/20 1438 09/10/20 0503 09/11/20 1330 09/12/20 0525  AST '19 16 19 22  '$ ALT '29 22 20 22  '$ ALKPHOS 87 69 62 63  BILITOT 3.9* 4.2* 5.1* 5.0*  PROT 7.2 5.4* 4.8* 4.6*  ALBUMIN 3.2* 2.2* 1.8* 1.8*    Recent Labs  Lab 09/09/20 1438  LIPASE 15    No results for input(s): AMMONIA in the last 168 hours. Coagulation Profile: No results for input(s): INR, PROTIME in the last 168 hours. Cardiac Enzymes: No results for input(s): CKTOTAL, CKMB, CKMBINDEX, TROPONINI in the last 168 hours. BNP (last 3 results) No results for input(s): PROBNP in the last 8760 hours. HbA1C: No results for input(s): HGBA1C in the last 72  hours. CBG: No results for input(s): GLUCAP in the last 168 hours. Lipid Profile: No results for input(s): CHOL, HDL, LDLCALC, TRIG, CHOLHDL, LDLDIRECT in the last 72 hours. Thyroid Function Tests: No results for input(s): TSH, T4TOTAL, FREET4, T3FREE, THYROIDAB in the last 72 hours. Anemia Panel: No results for input(s): VITAMINB12, FOLATE, FERRITIN, TIBC, IRON, RETICCTPCT in the last 72 hours. Sepsis Labs: No results for input(s): PROCALCITON, LATICACIDVEN in the last 168 hours.  Recent Results (from the past 240 hour(s))  Resp Panel by RT-PCR (Flu A&B, Covid) Nasopharyngeal Swab     Status: None   Collection Time: 09/09/20  5:25 PM   Specimen: Nasopharyngeal Swab;  Nasopharyngeal(NP) swabs in vial transport medium  Result Value Ref Range Status   SARS Coronavirus 2 by RT PCR NEGATIVE NEGATIVE Final    Comment: (NOTE) SARS-CoV-2 target nucleic acids are NOT DETECTED.  The SARS-CoV-2 RNA is generally detectable in upper respiratory specimens during the acute phase of infection. The lowest concentration of SARS-CoV-2 viral copies this assay can detect is 138 copies/mL. A negative result does not preclude SARS-Cov-2 infection and should not be used as the sole basis for treatment or other patient management decisions. A negative result may occur with  improper specimen collection/handling, submission of specimen other than nasopharyngeal swab, presence of viral mutation(s) within the areas targeted by this assay, and inadequate number of viral copies(<138 copies/mL). A negative result must be combined with clinical observations, patient history, and epidemiological information. The expected result is Negative.  Fact Sheet for Patients:  EntrepreneurPulse.com.au  Fact Sheet for Healthcare Providers:  IncredibleEmployment.be  This test is no t yet approved or cleared by the [place] FDA and  has been authorized for detection and/or  diagnosis of SARS-CoV-2 by FDA under an Emergency Use Authorization (EUA). This EUA will remain  in effect (meaning this test can be used) for the duration of the COVID-19 declaration under Section 564(b)(1) of the Act, 21 U.S.C.section 360bbb-3(b)(1), unless the authorization is terminated  or revoked sooner.       Influenza A by PCR NEGATIVE NEGATIVE Final   Influenza B by PCR NEGATIVE NEGATIVE Final    Comment: (NOTE) The Xpert Xpress SARS-CoV-2/FLU/RSV plus assay is intended as an aid in the diagnosis of influenza from Nasopharyngeal swab specimens and should not be used as a sole basis for treatment. Nasal washings and aspirates are unacceptable for Xpert Xpress SARS-CoV-2/FLU/RSV testing.  Fact Sheet for Patients: EntrepreneurPulse.com.au  Fact Sheet for Healthcare Providers: IncredibleEmployment.be  This test is not yet approved or cleared by the [place] FDA and has been authorized for detection and/or diagnosis of SARS-CoV-2 by FDA under an Emergency Use Authorization (EUA). This EUA will remain in effect (meaning this test can be used) for the duration of the COVID-19 declaration under Section 564(b)(1) of the Act, 21 U.S.C. section 360bbb-3(b)(1), unless the authorization is terminated or revoked.  Performed at [facility], [place] [address] [place], [place] [address]   C Difficile Quick Screen w PCR reflex     Status: None   Collection Time: 09/10/20 12:43 PM   Specimen: STOOL  Result Value Ref Range Status   C Diff antigen NEGATIVE NEGATIVE Final   C Diff toxin NEGATIVE NEGATIVE Final   C Diff interpretation No C. difficile detected.  Final    Comment: Performed at [facility], [place] [address], [place], [place] [address]       Radiology Studies: No results found.    Scheduled Meds:  chlorhexidine  15 mL Mouth Rinse BID   Chlorhexidine Gluconate Cloth  6 each Topical Daily    colestipol  1 g Oral BID   LORazepam  1 mg Intravenous QHS   mouth rinse  15 mL Mouth Rinse q12n4p   octreotide  100 mcg Subcutaneous Q12H   pantoprazole (PROTONIX) IV  40 mg Intravenous Q24H   scopolamine  1 patch Transdermal Q72H   Continuous Infusions:  lactated ringers with kcl 100 mL/hr at 09/13/20 0147   potassium chloride 10 mEq (09/13/20 1151)     LOS: 4 days      Time spent: 25 minutes   Anderson Malta  Maylene Roes, DO [facility] 09/13/2020, 12:22 PM   Available via Epic secure chat 7am-7pm After these hours, please refer to coverage provider listed on [email or web address]

## 2020-09-13 NOTE — Progress Notes (Signed)
Chaplain engaged in a follow-up visit with Ellen [name] and provided her with some puzzles, coloring activities, and a pillow.  Ellen [name] expressed wanting some puzzles and things to do while waiting in the hospital.    Chaplain is available to follow-up.    09/13/20 1300  Clinical Encounter Type  Visited With Patient and family together  Visit Type Follow-up

## 2020-09-14 LAB — CBC
HCT: 29.6 % — ABNORMAL LOW (ref 36.0–46.0)
Hemoglobin: 10.1 g/dL — ABNORMAL LOW (ref 12.0–15.0)
MCH: 30 pg (ref 26.0–34.0)
MCHC: 34.1 g/dL (ref 30.0–36.0)
MCV: 87.8 fL (ref 80.0–100.0)
Platelets: 117 10*3/uL — ABNORMAL LOW (ref 150–400)
RBC: 3.37 MIL/uL — ABNORMAL LOW (ref 3.87–5.11)
RDW: 12.7 % (ref 11.5–15.5)
WBC: 10.7 10*3/uL — ABNORMAL HIGH (ref 4.0–10.5)
nRBC: 0.6 % — ABNORMAL HIGH (ref 0.0–0.2)

## 2020-09-14 LAB — BASIC METABOLIC PANEL
Anion gap: 9 (ref 5–15)
BUN: 7 mg/dL (ref 6–20)
CO2: 25 mmol/L (ref 22–32)
Calcium: 7.5 mg/dL — ABNORMAL LOW (ref 8.9–10.3)
Chloride: 98 mmol/L (ref 98–111)
Creatinine, Ser: 0.37 mg/dL — ABNORMAL LOW (ref 0.44–1.00)
GFR, Estimated: >60 mL/min (ref >60)
Glucose, Bld: 102 mg/dL — ABNORMAL HIGH (ref 70–99)
Potassium: 3.2 mmol/L — ABNORMAL LOW (ref 3.5–5.1)
Sodium: 132 mmol/L — ABNORMAL LOW (ref 135–145)

## 2020-09-14 MED ORDER — COLESTIPOL HCL 5 G PO PACK
1.0000 g | PACK | Freq: Two times a day (BID) | ORAL | Status: DC
  Administered 2020-09-14 (×2): 1 g via ORAL
  Filled 2020-09-14 (×3): qty 1

## 2020-09-14 MED ORDER — PHENOL 1.4 % MT LIQD
1.0000 | OROMUCOSAL | Status: DC | PRN
  Filled 2020-09-14: qty 177

## 2020-09-14 MED ORDER — POTASSIUM CHLORIDE 10 MEQ/100ML IV SOLN
10.0000 meq | INTRAVENOUS | Status: AC
  Administered 2020-09-14 (×4): 10 meq via INTRAVENOUS
  Filled 2020-09-14 (×4): qty 100

## 2020-09-14 NOTE — Progress Notes (Signed)
Ellen DERHAMMER   DOB:[birth year]   OP:[id number]   [id number]  Subjective:  Ellen [name] is feeling moderately well today.  She notes she had three episodes of diarrhea overnight which is improved.  She says she cannot tolerate the Sandostatin injection any longer, it is too painful for her.  She is drinking water without difficulty and has not had any nausea/vomiting with this. She is still having difficulty with things other than water.     Objective:  Vitals:   09/13/20 2002 09/14/20 0559  BP: 106/64 120/74  Pulse: (!) 105 99  Resp: 18 18  Temp: 99.3 F (37.4 C) 98.7 F (37.1 C)  SpO2: 99% 99%    There is no height or weight on file to calculate BMI.  Intake/Output Summary (Last 24 hours) at 09/14/2020 0802 Last data filed at 09/13/2020 1805 Gross per 24 hour  Intake 2028.58 ml  Output --  Net 2028.58 ml   GENERAL: Lying in bed, appears sleepy HEENT:  Sclerae anicteric.  Oropharynx clear and moist. No ulcerations or evidence of oropharyngeal candidiasis. Neck is supple.  NODES:  No cervical, supraclavicular, or axillary lymphadenopathy palpated.  LUNGS:  Clear to auscultation bilaterally.  No wheezes or rhonchi. HEART:  Regular rate and rhythm. No murmur appreciated. ABDOMEN:  Soft, nontender.  Positive, normoactive bowel sounds. No organomegaly palpated. EXTREMITIES:  No peripheral edema.   SKIN:  Clear with no obvious rashes or skin changes. No nail dyscrasia. NEURO:  Nonfocal. Well oriented.  Appropriate affect.   CBG (last 3)  No results for input(s): GLUCAP in the last 72 hours.   Labs:  Lab Results  Component Value Date   WBC 10.7 (H) 09/14/2020   HGB 10.1 (L) 09/14/2020   HCT 29.6 (L) 09/14/2020   MCV 87.8 09/14/2020   PLT 117 (L) 09/14/2020   NEUTROABS 6.7 09/12/2020    '@LASTCHEMISTRY'$ @  Urine Studies No results for input(s): UHGB, CRYS in the last 72 hours.  Invalid input(s): UACOL, UAPR, USPG, UPH, UTP, UGL, UKET, UBIL, UNIT, UROB, [place], UEPI,  UWBC, [place], [place], [place], [place], [place]  Basic Metabolic Panel: Recent Labs  Lab 09/09/20 1438 09/10/20 0503 09/11/20 0452 09/12/20 0525 09/13/20 0418 09/14/20 0345  NA 126* 124* 127* 130* 129* 132*  K 3.3* 3.6 3.7 3.0* 3.0* 3.2*  CL 87* 93* 95* 95* 98 98  CO2 19* 20* 19* '22 22 25  '$ GLUCOSE 139* 93 100* 98 98 102*  BUN 36* '19 14 8 7 7  '$ CREATININE 3.39* 0.92 0.55 0.50 0.45 0.37*  CALCIUM 7.6* 7.4* 7.5* 7.7* 7.4* 7.5*  MG 2.3  --  2.6* 2.3 2.1  --   PHOS  --   --  2.0* 1.8*  --   --     GFR Estimated Creatinine Clearance: 96.7 mL/min (A) (by C-G formula based on SCr of 0.37 mg/dL (L)). Liver Function Tests: Recent Labs  Lab 09/09/20 1438 09/10/20 0503 09/11/20 1330 09/12/20 0525  AST '19 16 19 22  '$ ALT '29 22 20 22  '$ ALKPHOS 87 69 62 63  BILITOT 3.9* 4.2* 5.1* 5.0*  PROT 7.2 5.4* 4.8* 4.6*  ALBUMIN 3.2* 2.2* 1.8* 1.8*    Recent Labs  Lab 09/09/20 1438  LIPASE 15    No results for input(s): AMMONIA in the last 168 hours. Coagulation profile No results for input(s): INR, PROTIME in the last 168 hours.  CBC: Recent Labs  Lab 09/09/20 1438 09/10/20 0503 09/11/20 0452 09/12/20 0525 09/13/20 0418 09/14/20 0345  WBC 1.3*  1.6* 6.2 8.7 12.2* 10.7*  NEUTROABS 0.7*  --  4.4 6.7  --   --   HGB 16.1* 12.1 12.7 11.3* 10.6* 10.1*  HCT 43.8 33.6* 35.7* 32.4* 30.7* 29.6*  MCV 82.2 83.4 84.4 85.7 86.7 87.8  PLT 120* 99* 72* 62* 81* 117*    Cardiac Enzymes: No results for input(s): CKTOTAL, CKMB, CKMBINDEX, TROPONINI in the last 168 hours. BNP: Invalid input(s): POCBNP CBG: No results for input(s): GLUCAP in the last 168 hours. D-Dimer No results for input(s): DDIMER in the last 72 hours. Hgb A1c No results for input(s): HGBA1C in the last 72 hours. Lipid Profile No results for input(s): CHOL, HDL, LDLCALC, TRIG, CHOLHDL, LDLDIRECT in the last 72 hours. Thyroid function studies No results for input(s): TSH, T4TOTAL, T3FREE, THYROIDAB in the last 72 hours.  Invalid  input(s): FREET3 Anemia work up No results for input(s): VITAMINB12, FOLATE, FERRITIN, TIBC, IRON, RETICCTPCT in the last 72 hours. Microbiology     Studies:  No results found.  Assessment: 25 y.o. 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  She is slowly recovering.  Yesterday, Dr. Jana Hakim and I had spoken with Dr. Silverio Decamp about IV steroids, and I had placed a consult this morning after talking with Dr. Jana Hakim to operationalize that.  After speaking with Dr. Alvy Bimler and Dr. Paulita Fujita who are on today, we will hold off on IV steroids and the GI consult.    Will continue with bowel rest, clear liquids, electrolyte supplementation.  Recommended Raeleen get out of bed more today.  Will follow bilirubin with CMET daily.    LOS: 5 PPX: protonix, SCDs CODE: FULL  The above was reviewed with Dr. Alvy Bimler in detail, and she agrees with the assessment and plan.    Wilber Bihari, NP 09/14/20 8:02 AM Medical Oncology and Hematology [facility] [place], [place] [address] Tel. [phone]    Fax. [phone]

## 2020-09-14 NOTE — Progress Notes (Signed)
PROGRESS NOTE    Ellen [name]  [id number] DOB: [birth year] DOA: 09/09/2020 PCP: Ellen [name], No     Brief Narrative:  Ellen [name] is a 25 year old female with PMH significant for carcinoma of left breast, ADHD, anxiety who presented to the ED with complaints of persistent nausea, vomiting and diarrhea.  Patient has recently completed 10-day course of chemotherapy.  During this time she has developed diarrhea intermittently, but it became persistent for last 4 days. Patient also reported during this time, she had 2 rounds of chemo with the most recent being 2 days ago. She has developed persistent nausea and vomiting and decreased p.o. intake. She was unable to keep anything down. She was found to be in sinus tachycardia, she was given IV fluids which improved the heart rate. Serum creatinine found to be 3.36 which is improved with IV hydration.  Patient is found to be neutropenic but afebrile.  New events last 24 hours / Subjective: Still having diarrhea but with slight improvement.  Able to tolerate water only.  Has not had any vomiting.  Assessment & Plan:   Active Problems:   AKI (acute kidney injury) ([place])   Intractable nausea, vomiting and diarrhea in the setting of chemotherapy for left breast cancer: Patient recently has completed 10-day course of chemotherapy. She has developed persistent nausea, vomiting and diarrhea during that course. Continue IV fluids, scopolamine, Ativan and Protonix as needed. Stool for C Diff : Negative, Continue Imodium as needed. Heme states she is having Perjeta associated diarrhea , typhlitis unlikely given no leukocytosis, remains afebrile. Treatment is mainly supportive,  continue Imodium, colestipol or Questran Continue clear liquid diet for now   AKI secondary to dehydration:  Resolved    Hypokalemia: Replace   Hyponatremia: Improved   Prolonged QT interval: Avoid Zofran and Reglan. Repeat EKG normalization of QT prolongation.    History of breast cancer on chemo: Followed by Dr. Lindi Adie    Leukopenia/ thrombocytopenia/neutropenia: Improving   DVT prophylaxis:  SCDs Start: 09/10/20 0013  Code Status:     Code Status Orders  (From admission, onward)           Start     Ordered   09/10/20 0013  Full code  Continuous        09/10/20 0012           Code Status History     This patient has a current code status but no historical code status.      Family Communication: Family member sleeping at bedside Disposition Plan:  Status is: Inpatient  Remains inpatient appropriate because:IV treatments appropriate due to intensity of illness or inability to take PO and Inpatient level of care appropriate due to severity of illness  Dispo: The patient is from: Home              Anticipated d/c is to: Home              Patient currently is not medically stable to d/c.   Difficult to place patient No      Consultants:  Oncology   Procedures:  None   Antimicrobials:  Anti-infectives (From admission, onward)    Start     Dose/Rate Route Frequency Ordered Stop   09/13/20 1700  acyclovir (ZOVIRAX) 315 mg in dextrose 5 % 100 mL IVPB        5 mg/kg  63.2 kg 106.3 mL/hr over 60 Minutes Intravenous Every 8 hours 09/13/20 1610  Objective: Vitals:   09/13/20 0408 09/13/20 1345 09/13/20 2002 09/14/20 0559  BP: (!) 133/92 116/72 106/64 120/74  Pulse: (!) 108 (!) 108 (!) 105 99  Resp: '15  18 18  '$ Temp: 98.3 F (36.8 C) 99.5 F (37.5 C) 99.3 F (37.4 C) 98.7 F (37.1 C)  TempSrc: Oral Oral Oral   SpO2: 99% 98% 99% 99%    Intake/Output Summary (Last 24 hours) at 09/14/2020 1217 Last data filed at 09/14/2020 0950 Gross per 24 hour  Intake 2264.58 ml  Output --  Net 2264.58 ml    There were no vitals filed for this visit.  Examination: General exam: Appears calm and comfortable  Respiratory system: Clear to auscultation. Respiratory effort normal. Cardiovascular system: S1 &  S2 heard, tachycardic, regular rhythm. No pedal edema. Gastrointestinal system: Abdomen is nondistended, soft and nontender. Normal bowel sounds heard. Central nervous system: Alert and oriented. Non focal exam. Speech clear  Extremities: Symmetric in appearance bilaterally  Skin: No rashes, lesions or ulcers on exposed skin  Psychiatry: Judgement and insight appear stable. Mood & affect appropriate.    Data Reviewed: I have personally reviewed following labs and imaging studies  CBC: Recent Labs  Lab 09/09/20 1438 09/10/20 0503 09/11/20 0452 09/12/20 0525 09/13/20 0418 09/14/20 0345  WBC 1.3* 1.6* 6.2 8.7 12.2* 10.7*  NEUTROABS 0.7*  --  4.4 6.7  --   --   HGB 16.1* 12.1 12.7 11.3* 10.6* 10.1*  HCT 43.8 33.6* 35.7* 32.4* 30.7* 29.6*  MCV 82.2 83.4 84.4 85.7 86.7 87.8  PLT 120* 99* 72* 62* 81* 117*    Basic Metabolic Panel: Recent Labs  Lab 09/09/20 1438 09/10/20 0503 09/11/20 0452 09/12/20 0525 09/13/20 0418 09/14/20 0345  NA 126* 124* 127* 130* 129* 132*  K 3.3* 3.6 3.7 3.0* 3.0* 3.2*  CL 87* 93* 95* 95* 98 98  CO2 19* 20* 19* '22 22 25  '$ GLUCOSE 139* 93 100* 98 98 102*  BUN 36* '19 14 8 7 7  '$ CREATININE 3.39* 0.92 0.55 0.50 0.45 0.37*  CALCIUM 7.6* 7.4* 7.5* 7.7* 7.4* 7.5*  MG 2.3  --  2.6* 2.3 2.1  --   PHOS  --   --  2.0* 1.8*  --   --     GFR: Estimated Creatinine Clearance: 96.7 mL/min (A) (by C-G formula based on SCr of 0.37 mg/dL (L)). Liver Function Tests: Recent Labs  Lab 09/09/20 1438 09/10/20 0503 09/11/20 1330 09/12/20 0525  AST '19 16 19 22  '$ ALT '29 22 20 22  '$ ALKPHOS 87 69 62 63  BILITOT 3.9* 4.2* 5.1* 5.0*  PROT 7.2 5.4* 4.8* 4.6*  ALBUMIN 3.2* 2.2* 1.8* 1.8*    Recent Labs  Lab 09/09/20 1438  LIPASE 15    No results for input(s): AMMONIA in the last 168 hours. Coagulation Profile: No results for input(s): INR, PROTIME in the last 168 hours. Cardiac Enzymes: No results for input(s): CKTOTAL, CKMB, CKMBINDEX, TROPONINI in the last  168 hours. BNP (last 3 results) No results for input(s): PROBNP in the last 8760 hours. HbA1C: No results for input(s): HGBA1C in the last 72 hours. CBG: No results for input(s): GLUCAP in the last 168 hours. Lipid Profile: No results for input(s): CHOL, HDL, LDLCALC, TRIG, CHOLHDL, LDLDIRECT in the last 72 hours. Thyroid Function Tests: No results for input(s): TSH, T4TOTAL, FREET4, T3FREE, THYROIDAB in the last 72 hours. Anemia Panel: No results for input(s): VITAMINB12, FOLATE, FERRITIN, TIBC, IRON, RETICCTPCT in the last 72 hours. Sepsis  Labs: No results for input(s): PROCALCITON, LATICACIDVEN in the last 168 hours.  Recent Results (from the past 240 hour(s))  Resp Panel by RT-PCR (Flu A&B, Covid) Nasopharyngeal Swab     Status: None   Collection Time: 09/09/20  5:25 PM   Specimen: Nasopharyngeal Swab; Nasopharyngeal(NP) swabs in vial transport medium  Result Value Ref Range Status   SARS Coronavirus 2 by RT PCR NEGATIVE NEGATIVE Final    Comment: (NOTE) SARS-CoV-2 target nucleic acids are NOT DETECTED.  The SARS-CoV-2 RNA is generally detectable in upper respiratory specimens during the acute phase of infection. The lowest concentration of SARS-CoV-2 viral copies this assay can detect is 138 copies/mL. A negative result does not preclude SARS-Cov-2 infection and should not be used as the sole basis for treatment or other patient management decisions. A negative result may occur with  improper specimen collection/handling, submission of specimen other than nasopharyngeal swab, presence of viral mutation(s) within the areas targeted by this assay, and inadequate number of viral copies(<138 copies/mL). A negative result must be combined with clinical observations, patient history, and epidemiological information. The expected result is Negative.  Fact Sheet for Patients:  EntrepreneurPulse.com.au  Fact Sheet for Healthcare Providers:   IncredibleEmployment.be  This test is no t yet approved or cleared by the [place] FDA and  has been authorized for detection and/or diagnosis of SARS-CoV-2 by FDA under an Emergency Use Authorization (EUA). This EUA will remain  in effect (meaning this test can be used) for the duration of the COVID-19 declaration under Section 564(b)(1) of the Act, 21 U.S.C.section 360bbb-3(b)(1), unless the authorization is terminated  or revoked sooner.       Influenza A by PCR NEGATIVE NEGATIVE Final   Influenza B by PCR NEGATIVE NEGATIVE Final    Comment: (NOTE) The Xpert Xpress SARS-CoV-2/FLU/RSV plus assay is intended as an aid in the diagnosis of influenza from Nasopharyngeal swab specimens and should not be used as a sole basis for treatment. Nasal washings and aspirates are unacceptable for Xpert Xpress SARS-CoV-2/FLU/RSV testing.  Fact Sheet for Patients: EntrepreneurPulse.com.au  Fact Sheet for Healthcare Providers: IncredibleEmployment.be  This test is not yet approved or cleared by the [place] FDA and has been authorized for detection and/or diagnosis of SARS-CoV-2 by FDA under an Emergency Use Authorization (EUA). This EUA will remain in effect (meaning this test can be used) for the duration of the COVID-19 declaration under Section 564(b)(1) of the Act, 21 U.S.C. section 360bbb-3(b)(1), unless the authorization is terminated or revoked.  Performed at [facility], [place] [address] [place], [place] [address]   C Difficile Quick Screen w PCR reflex     Status: None   Collection Time: 09/10/20 12:43 PM   Specimen: STOOL  Result Value Ref Range Status   C Diff antigen NEGATIVE NEGATIVE Final   C Diff toxin NEGATIVE NEGATIVE Final   C Diff interpretation No C. difficile detected.  Final    Comment: Performed at [facility], [place] [address], [place], [place] [address]        Radiology Studies: No results found.    Scheduled Meds:  chlorhexidine  15 mL Mouth Rinse BID   Chlorhexidine Gluconate Cloth  6 each Topical Daily   colestipol  1 g Oral BID   LORazepam  1 mg Intravenous QHS   mouth rinse  15 mL Mouth Rinse q12n4p   pantoprazole (PROTONIX) IV  40 mg Intravenous Q24H   scopolamine  1 patch Transdermal Q72H  Continuous Infusions:  acyclovir 315 mg (09/14/20 0605)   lactated ringers with kcl 100 mL/hr at 09/13/20 0147     LOS: 5 days      Time spent: 25 minutes   Dessa Phi, DO [facility] 09/14/2020, 12:17 PM   Available via Epic secure chat 7am-7pm After these hours, please refer to coverage provider listed on [email or web address]

## 2020-09-14 NOTE — Evaluation (Signed)
Physical Therapy Evaluation Patient Details Name: Ellen [name] MRN: [id number] DOB: 03-01-1995 Today's Date: 09/14/2020   Clinical Impression  Pt is a 25 y.o. female with below HPI. Pt performed all mobility with MIN guard-supervision for safety. Pt ambulated ~175f with decreased velocity, Lt drift x3, and furniture surfing toward end of ambulation for stability. Pt presents below baseline mobility level and will benefit from continued skilled PT in order to address listed impairments with focus on higher level balance training to maximize functional mobility and ensure safety upon d/c.       09/14/20 1200  PT Visit Information  Last PT Received On 09/14/20  Assistance Needed +1  History of Present Illness Pt is a 25y.o. female who presented to the ED with complaints of persistent nausea, vomiting and diarrhea.  PMH  significant for carcinoma of left breast, ADHD, anxiety  Precautions  Precautions Fall  Restrictions  Weight Bearing Restrictions No  Home Living  Family/patient expects to be discharged to: Private residence  Living Arrangements Parent  Available Help at Discharge Family;Other (Comment) (boyfriend)  Type of [place] enter  Entrance Stairs-Number of Steps 2  Entrance Stairs-Rails Right  Home Layout Multi-level  Alternate Level Stairs-Number of Steps 7  Alternate Level Stairs-Rails Left  Home Equipment None  Additional Comments denies history of falls  Prior Function  Level of Independence Independent  Communication  Communication No difficulties  Pain Assessment  Pain Assessment 0-10  Pain Score 4  Pain Location abdomen  Pain Descriptors / Indicators Aching;Discomfort  Pain Intervention(s) Limited activity within patient's tolerance;Monitored during session;Repositioned  Cognition  Arousal/Alertness Awake/alert  Behavior During Therapy WFL for tasks assessed/performed  Overall Cognitive Status Within Functional Limits for  tasks assessed  Upper Extremity Assessment  Upper Extremity Assessment Overall WFL for tasks assessed  Lower Extremity Assessment  Lower Extremity Assessment Generalized weakness  Cervical / Trunk Assessment  Cervical / Trunk Assessment Normal  Bed Mobility  General bed mobility comments Pt OOB in recliner  Transfers  Overall transfer level Needs assistance  Equipment used None  Transfers Sit to/from Stand  Sit to Stand Min guard  General transfer comment MIn guard for safety with increased time to rise from recliner , pt with use of B UEs for power up  Ambulation/Gait  Ambulation/Gait assistance Min guard;Supervision  Gait Distance (Feet) 180 Feet  Assistive device None  Gait Pattern/deviations Step-through pattern;Decreased stride length;Drifts right/left  General Gait Details MIN guard progressing to supervision for safety without use of assistive device. Pt dislpayed drifting to Lt x3 with ambulation in hallway, eventually self correcting. Pt furniture surfing once close to room with single UE for improved stability when fatigued.  Gait velocity decr  Balance  Overall balance assessment Needs assistance  Sitting-balance support Feet supported  Sitting balance-Leahy Scale Good  Standing balance support No upper extremity supported;During functional activity  Standing balance-Leahy Scale Fair  General Comments  General comments (skin integrity, edema, etc.) 5 time sit to stand: 33.53- increased time to stand without use of UEs for power up, mild balance deficits observed  PT - End of Session  Equipment Utilized During Treatment Gait belt  Activity Tolerance Patient tolerated treatment well  Patient left in chair;with call bell/phone within reach;with family/visitor present;with nursing/sitter in room  Nurse Communication Mobility status  PT Assessment  PT Recommendation/Assessment Patient needs continued PT services  PT Visit Diagnosis Unsteadiness on feet (R26.81);Muscle  weakness (generalized) (M62.81)  PT Problem List Decreased  strength;Decreased activity tolerance;Decreased balance;Decreased mobility  PT Plan  PT Frequency (ACUTE ONLY) Min 3X/week  PT Treatment/Interventions (ACUTE ONLY) Gait training;Stair training;Functional mobility training;Therapeutic activities;Therapeutic exercise;Balance training;Patient/family education  AM-PAC PT "6 Clicks" Mobility Outcome Measure (Version 2)  Help needed turning from your back to your side while in a flat bed without using bedrails? 4  Help needed moving from lying on your back to sitting on the side of a flat bed without using bedrails? 3  Help needed moving to and from a bed to a chair (including a wheelchair)? 3  Help needed standing up from a chair using your arms (e.g., wheelchair or bedside chair)? 3  Help needed to walk in hospital room? 3  Help needed climbing 3-5 steps with a railing?  2  6 Click Score 18  Consider Recommendation of Discharge To: Home with [facility]  Progressive Mobility  What is the highest level of mobility based on the progressive mobility assessment? Level 5 (Walks with assist in room/hall) - Balance while stepping forward/back and can walk in room with assist - Complete  Mobility Ambulated with assistance in hallway  PT Recommendation  Follow Up Recommendations No PT follow up (Anticipate pt will progress well with increased mobility)  PT equipment None recommended by PT  Individuals Consulted  Consulted and Agree with Results and Recommendations Patient;Family member/caregiver  Family Member Consulted pt's boyfriend  Acute Rehab PT Goals  Patient Stated Goal Be able to move around more often  PT Goal Formulation With patient/family  Time For Goal Achievement 09/28/20  Potential to Achieve Goals Good  PT Time Calculation  PT Start Time (ACUTE ONLY) 1256  PT Stop Time (ACUTE ONLY) 1319  PT Time Calculation (min) (ACUTE ONLY) 23 min  PT General Charges  $$ ACUTE PT VISIT 1 Visit   PT Evaluation  $PT Eval Low Complexity 1 Low  PT Treatments  $Therapeutic Activity 8-22 mins  Written Expression  Dominant Hand Right        Festus Barren PT, DPT  Acute Rehabilitation Services  Office [phone]    09/14/2020, 4:39 PM

## 2020-09-15 LAB — COMPREHENSIVE METABOLIC PANEL
ALT: 54 U/L — ABNORMAL HIGH (ref 0–44)
AST: 51 U/L — ABNORMAL HIGH (ref 15–41)
Albumin: 2 g/dL — ABNORMAL LOW (ref 3.5–5.0)
Alkaline Phosphatase: 171 U/L — ABNORMAL HIGH (ref 38–126)
Anion gap: 8 (ref 5–15)
BUN: <5 mg/dL — ABNORMAL LOW (ref 6–20)
CO2: 24 mmol/L (ref 22–32)
Calcium: 7.6 mg/dL — ABNORMAL LOW (ref 8.9–10.3)
Chloride: 98 mmol/L (ref 98–111)
Creatinine, Ser: <0.3 mg/dL — ABNORMAL LOW (ref 0.44–1.00)
Glucose, Bld: 93 mg/dL (ref 70–99)
Potassium: 3.3 mmol/L — ABNORMAL LOW (ref 3.5–5.1)
Sodium: 130 mmol/L — ABNORMAL LOW (ref 135–145)
Total Bilirubin: 3.8 mg/dL — ABNORMAL HIGH (ref 0.3–1.2)
Total Protein: 4.7 g/dL — ABNORMAL LOW (ref 6.5–8.1)

## 2020-09-15 LAB — CBC
HCT: 27.5 % — ABNORMAL LOW (ref 36.0–46.0)
Hemoglobin: 9.3 g/dL — ABNORMAL LOW (ref 12.0–15.0)
MCH: 29.9 pg (ref 26.0–34.0)
MCHC: 33.8 g/dL (ref 30.0–36.0)
MCV: 88.4 fL (ref 80.0–100.0)
Platelets: 134 10*3/uL — ABNORMAL LOW (ref 150–400)
RBC: 3.11 MIL/uL — ABNORMAL LOW (ref 3.87–5.11)
RDW: 13.2 % (ref 11.5–15.5)
WBC: 9 10*3/uL (ref 4.0–10.5)
nRBC: 0.7 % — ABNORMAL HIGH (ref 0.0–0.2)

## 2020-09-15 MED ORDER — POTASSIUM CHLORIDE 10 MEQ/100ML IV SOLN
10.0000 meq | INTRAVENOUS | Status: AC
  Administered 2020-09-15 (×4): 10 meq via INTRAVENOUS
  Filled 2020-09-15 (×4): qty 100

## 2020-09-15 MED ORDER — ALUM & MAG HYDROXIDE-SIMETH 200-200-20 MG/5ML PO SUSP
15.0000 mL | Freq: Four times a day (QID) | ORAL | Status: DC | PRN
  Administered 2020-09-15 – 2020-09-16 (×2): 15 mL via ORAL
  Filled 2020-09-15 (×2): qty 30

## 2020-09-15 MED ORDER — MAGIC MOUTHWASH
5.0000 mL | Freq: Three times a day (TID) | ORAL | Status: DC | PRN
  Administered 2020-09-15 – 2020-09-16 (×2): 5 mL via ORAL
  Filled 2020-09-15 (×3): qty 5

## 2020-09-15 NOTE — Progress Notes (Signed)
PROGRESS NOTE    Ellen [name]  [id number] DOB: 1995-03-17 DOA: 09/09/2020 PCP: Merryl Hacker, No     Brief Narrative:  Ellen [name] is a 25 year old female with PMH significant for carcinoma of left breast, ADHD, anxiety who presented to the ED with complaints of persistent nausea, vomiting and diarrhea.  Patient has recently completed 10-day course of chemotherapy.  During this time she has developed diarrhea intermittently, but it became persistent for last 4 days. Patient also reported during this time, she had 2 rounds of chemo with the most recent being 2 days ago. She has developed persistent nausea and vomiting and decreased p.o. intake. She was unable to keep anything down. She was found to be in sinus tachycardia, she was given IV fluids which improved the heart rate. Serum creatinine found to be 3.36 which is improved with IV hydration.  Patient is found to be neutropenic but afebrile.  New events last 24 hours / Subjective: Diet advance to full liquids, has been able to tolerate water, apple juice and some grits.  No vomiting.  Diarrhea has slowed down.  Complains of dry mouth, which wakes her up from sleep.  States that she was able to walk down the hall yesterday.  Assessment & Plan:   Active Problems:   AKI (acute kidney injury) ([place])   Intractable nausea, vomiting and diarrhea in the setting of chemotherapy for left breast cancer: Patient recently has completed 10-day course of chemotherapy. She has developed persistent nausea, vomiting and diarrhea during that course. Continue IV fluids, Ativan and Protonix as needed. Stool for C Diff : Negative, Continue Imodium as needed. Heme states she is having Perjeta associated diarrhea , typhlitis unlikely given no leukocytosis, remains afebrile. Treatment is mainly supportive,  continue Imodium, colestipol or Questran Continue full liquid diet for now   AKI secondary to dehydration:  Resolved    Hypokalemia: Replace    Hyponatremia: Improved   Prolonged QT interval: Avoid Zofran and Reglan. Repeat EKG normalization of QT prolongation.   History of breast cancer on chemo: Followed by Dr. Lindi Adie    Leukopenia/ thrombocytopenia/neutropenia: Improving   DVT prophylaxis:  SCDs Start: 09/10/20 0013  Code Status:     Code Status Orders  (From admission, onward)           Start     Ordered   09/10/20 0013  Full code  Continuous        09/10/20 0012           Code Status History     This patient has a current code status but no historical code status.      Family Communication: Mom at bedside Disposition Plan:  Status is: Inpatient  Remains inpatient appropriate because:IV treatments appropriate due to intensity of illness or inability to take PO and Inpatient level of care appropriate due to severity of illness  Dispo: The patient is from: Home              Anticipated d/c is to: Home              Patient currently is not medically stable to d/c.   Difficult to place patient No      Consultants:  Oncology   Procedures:  None   Antimicrobials:  Anti-infectives (From admission, onward)    Start     Dose/Rate Route Frequency Ordered Stop   09/13/20 1700  acyclovir (ZOVIRAX) 315 mg in dextrose 5 % 100 mL IVPB  5 mg/kg  63.2 kg 106.3 mL/hr over 60 Minutes Intravenous Every 8 hours 09/13/20 1610          Objective: Vitals:   09/14/20 1320 09/14/20 2103 09/15/20 0532 09/15/20 0930  BP: 118/74 112/70 (!) 107/59 124/77  Pulse: (!) 108 (!) 110 (!) 116 (!) 122  Resp: '17 18 20 18  '$ Temp: 97.7 F (36.5 C) 98.1 F (36.7 C) 98.4 F (36.9 C) 98.6 F (37 C)  TempSrc: Oral  Oral Oral  SpO2: 99% 97% 96% 99%    Intake/Output Summary (Last 24 hours) at 09/15/2020 1244 Last data filed at 09/15/2020 1100 Gross per 24 hour  Intake 2633.17 ml  Output --  Net 2633.17 ml    There were no vitals filed for this visit. Examination: General exam: Appears calm and  comfortable  Respiratory system: Clear to auscultation. Respiratory effort normal. Cardiovascular system: S1 & S2 heard, tachycardic, regular rhythm. No pedal edema. Gastrointestinal system: Abdomen is nondistended, soft and nontender. Normal bowel sounds heard. Central nervous system: Alert and oriented. Non focal exam. Speech clear  Extremities: Symmetric in appearance bilaterally  Skin: No rashes, lesions or ulcers on exposed skin  Psychiatry: Judgement and insight appear stable. Mood & affect appropriate.     Data Reviewed: I have personally reviewed following labs and imaging studies  CBC: Recent Labs  Lab 09/09/20 1438 09/10/20 0503 09/11/20 0452 09/12/20 0525 09/13/20 0418 09/14/20 0345 09/15/20 0324  WBC 1.3*   < > 6.2 8.7 12.2* 10.7* 9.0  NEUTROABS 0.7*  --  4.4 6.7  --   --   --   HGB 16.1*   < > 12.7 11.3* 10.6* 10.1* 9.3*  HCT 43.8   < > 35.7* 32.4* 30.7* 29.6* 27.5*  MCV 82.2   < > 84.4 85.7 86.7 87.8 88.4  PLT 120*   < > 72* 62* 81* 117* 134*   < > = values in this interval not displayed.    Basic Metabolic Panel: Recent Labs  Lab 09/09/20 1438 09/10/20 0503 09/11/20 0452 09/12/20 0525 09/13/20 0418 09/14/20 0345 09/15/20 0324  NA 126*   < > 127* 130* 129* 132* 130*  K 3.3*   < > 3.7 3.0* 3.0* 3.2* 3.3*  CL 87*   < > 95* 95* 98 98 98  CO2 19*   < > 19* '22 22 25 24  '$ GLUCOSE 139*   < > 100* 98 98 102* 93  BUN 36*   < > '14 8 7 7 '$ <5*  CREATININE 3.39*   < > 0.55 0.50 0.45 0.37* <0.30*  CALCIUM 7.6*   < > 7.5* 7.7* 7.4* 7.5* 7.6*  MG 2.3  --  2.6* 2.3 2.1  --   --   PHOS  --   --  2.0* 1.8*  --   --   --    < > = values in this interval not displayed.    GFR: CrCl cannot be calculated (This lab value cannot be used to calculate CrCl because it is not a number: <0.30). Liver Function Tests: Recent Labs  Lab 09/09/20 1438 09/10/20 0503 09/11/20 1330 09/12/20 0525 09/15/20 0324  AST '19 16 19 22 '$ 51*  ALT '29 22 20 22 '$ 54*  ALKPHOS 87 69 62 63 171*   BILITOT 3.9* 4.2* 5.1* 5.0* 3.8*  PROT 7.2 5.4* 4.8* 4.6* 4.7*  ALBUMIN 3.2* 2.2* 1.8* 1.8* 2.0*    Recent Labs  Lab 09/09/20 1438  LIPASE 15    No  results for input(s): AMMONIA in the last 168 hours. Coagulation Profile: No results for input(s): INR, PROTIME in the last 168 hours. Cardiac Enzymes: No results for input(s): CKTOTAL, CKMB, CKMBINDEX, TROPONINI in the last 168 hours. BNP (last 3 results) No results for input(s): PROBNP in the last 8760 hours. HbA1C: No results for input(s): HGBA1C in the last 72 hours. CBG: No results for input(s): GLUCAP in the last 168 hours. Lipid Profile: No results for input(s): CHOL, HDL, LDLCALC, TRIG, CHOLHDL, LDLDIRECT in the last 72 hours. Thyroid Function Tests: No results for input(s): TSH, T4TOTAL, FREET4, T3FREE, THYROIDAB in the last 72 hours. Anemia Panel: No results for input(s): VITAMINB12, FOLATE, FERRITIN, TIBC, IRON, RETICCTPCT in the last 72 hours. Sepsis Labs: No results for input(s): PROCALCITON, LATICACIDVEN in the last 168 hours.  Recent Results (from the past 240 hour(s))  Resp Panel by RT-PCR (Flu A&B, Covid) Nasopharyngeal Swab     Status: None   Collection Time: 09/09/20  5:25 PM   Specimen: Nasopharyngeal Swab; Nasopharyngeal(NP) swabs in vial transport medium  Result Value Ref Range Status   SARS Coronavirus 2 by RT PCR NEGATIVE NEGATIVE Final    Comment: (NOTE) SARS-CoV-2 target nucleic acids are NOT DETECTED.  The SARS-CoV-2 RNA is generally detectable in upper respiratory specimens during the acute phase of infection. The lowest concentration of SARS-CoV-2 viral copies this assay can detect is 138 copies/mL. A negative result does not preclude SARS-Cov-2 infection and should not be used as the sole basis for treatment or other patient management decisions. A negative result may occur with  improper specimen collection/handling, submission of specimen other than nasopharyngeal swab, presence of viral  mutation(s) within the areas targeted by this assay, and inadequate number of viral copies(<138 copies/mL). A negative result must be combined with clinical observations, patient history, and epidemiological information. The expected result is Negative.  Fact Sheet for Patients:  EntrepreneurPulse.com.au  Fact Sheet for Healthcare Providers:  IncredibleEmployment.be  This test is no t yet approved or cleared by the [place] FDA and  has been authorized for detection and/or diagnosis of SARS-CoV-2 by FDA under an Emergency Use Authorization (EUA). This EUA will remain  in effect (meaning this test can be used) for the duration of the COVID-19 declaration under Section 564(b)(1) of the Act, 21 U.S.C.section 360bbb-3(b)(1), unless the authorization is terminated  or revoked sooner.       Influenza A by PCR NEGATIVE NEGATIVE Final   Influenza B by PCR NEGATIVE NEGATIVE Final    Comment: (NOTE) The Xpert Xpress SARS-CoV-2/FLU/RSV plus assay is intended as an aid in the diagnosis of influenza from Nasopharyngeal swab specimens and should not be used as a sole basis for treatment. Nasal washings and aspirates are unacceptable for Xpert Xpress SARS-CoV-2/FLU/RSV testing.  Fact Sheet for Patients: EntrepreneurPulse.com.au  Fact Sheet for Healthcare Providers: IncredibleEmployment.be  This test is not yet approved or cleared by the [place] FDA and has been authorized for detection and/or diagnosis of SARS-CoV-2 by FDA under an Emergency Use Authorization (EUA). This EUA will remain in effect (meaning this test can be used) for the duration of the COVID-19 declaration under Section 564(b)(1) of the Act, 21 U.S.C. section 360bbb-3(b)(1), unless the authorization is terminated or revoked.  Performed at [facility], [place] [address] [place],  [address]   C Difficile Quick  Screen w PCR reflex     Status: None   Collection Time: 09/10/20 12:43 PM   Specimen: STOOL  Result Value  Ref Range Status   C Diff antigen NEGATIVE NEGATIVE Final   C Diff toxin NEGATIVE NEGATIVE Final   C Diff interpretation No C. difficile detected.  Final    Comment: Performed at [facility], [place] [address], [place], [place] [address]       Radiology Studies: No results found.    Scheduled Meds:  chlorhexidine  15 mL Mouth Rinse BID   Chlorhexidine Gluconate Cloth  6 each Topical Daily   LORazepam  1 mg Intravenous QHS   mouth rinse  15 mL Mouth Rinse q12n4p   pantoprazole (PROTONIX) IV  40 mg Intravenous Q24H   Continuous Infusions:  acyclovir 315 mg (09/15/20 0546)   lactated ringers with kcl 100 mL/hr at 09/15/20 0328   potassium chloride 10 mEq (09/15/20 1116)     LOS: 6 days      Time spent: 25 minutes   Dessa Phi, DO [facility] 09/15/2020, 12:44 PM   Available via Epic secure chat 7am-7pm After these hours, please refer to coverage provider listed on [email or web address]

## 2020-09-15 NOTE — Progress Notes (Signed)
   09/15/20 0532  Assess: MEWS Score  Temp 98.4 F (36.9 C)  BP (!) 107/59  Pulse Rate (!) 116  Resp 20  Level of Consciousness Alert  SpO2 96 %  O2 Device Room Air  Patient Activity (if Appropriate) In bed  Assess: MEWS Score  MEWS Temp 0  MEWS Systolic 0  MEWS Pulse 2  MEWS RR 0  MEWS LOC 0  MEWS Score 2  MEWS Score Color Yellow  Assess: if the MEWS score is Yellow or Red  Were vital signs taken at a resting state? Yes  Focused Assessment No change from prior assessment  Does the patient meet 2 or more of the SIRS criteria? No  Does the patient have a confirmed or suspected source of infection? No  MEWS guidelines implemented *See Row Information* Yes  Treat  MEWS Interventions Escalated (See documentation below) (notified Bess Kinds, RN charge nurse)  Take Vital Signs  Increase Vital Sign Frequency  Yellow: Q 2hr X 2 then Q 4hr X 2, if remains yellow, continue Q 4hrs  Escalate  MEWS: Escalate Yellow: discuss with charge nurse/RN and consider discussing with provider and RRT  Notify: Charge Nurse/RN  Name of Charge Nurse/RN Notified  Bess Kinds RN)  Date Charge Nurse/RN Notified 09/15/20  Time Charge Nurse/RN Notified 0533  Document  Patient Outcome Other (Comment) (vital signs within normal limits)  Progress note created (see row info) Yes  Assess: SIRS CRITERIA  SIRS Temperature  0  SIRS Pulse 1  SIRS Respirations  0  SIRS WBC 0  SIRS Score Sum  1   Charge nurse Bess Kinds, RN notified for yellow MEWS. Patient alert and oriented x 4, no change in patient status. Yellow MEWS guidelines implemented. Patient has been in sinus tachycardia since admission to hospital. Will continue to monitor.

## 2020-09-16 LAB — COMPREHENSIVE METABOLIC PANEL
ALT: 52 U/L — ABNORMAL HIGH (ref 0–44)
AST: 38 U/L (ref 15–41)
Albumin: 2.1 g/dL — ABNORMAL LOW (ref 3.5–5.0)
Alkaline Phosphatase: 171 U/L — ABNORMAL HIGH (ref 38–126)
Anion gap: 6 (ref 5–15)
BUN: <5 mg/dL — ABNORMAL LOW (ref 6–20)
CO2: 24 mmol/L (ref 22–32)
Calcium: 7.6 mg/dL — ABNORMAL LOW (ref 8.9–10.3)
Chloride: 99 mmol/L (ref 98–111)
Creatinine, Ser: 0.42 mg/dL — ABNORMAL LOW (ref 0.44–1.00)
GFR, Estimated: >60 mL/min (ref >60)
Glucose, Bld: 103 mg/dL — ABNORMAL HIGH (ref 70–99)
Potassium: 3.5 mmol/L (ref 3.5–5.1)
Sodium: 129 mmol/L — ABNORMAL LOW (ref 135–145)
Total Bilirubin: 3.4 mg/dL — ABNORMAL HIGH (ref 0.3–1.2)
Total Protein: 5 g/dL — ABNORMAL LOW (ref 6.5–8.1)

## 2020-09-16 LAB — CBC
HCT: 28.1 % — ABNORMAL LOW (ref 36.0–46.0)
Hemoglobin: 9.3 g/dL — ABNORMAL LOW (ref 12.0–15.0)
MCH: 30.1 pg (ref 26.0–34.0)
MCHC: 33.1 g/dL (ref 30.0–36.0)
MCV: 90.9 fL (ref 80.0–100.0)
Platelets: 167 10*3/uL (ref 150–400)
RBC: 3.09 MIL/uL — ABNORMAL LOW (ref 3.87–5.11)
RDW: 13.3 % (ref 11.5–15.5)
WBC: 8.8 10*3/uL (ref 4.0–10.5)
nRBC: 0.7 % — ABNORMAL HIGH (ref 0.0–0.2)

## 2020-09-16 MED ORDER — LIDOCAINE VISCOUS HCL 2 % MT SOLN
15.0000 mL | Freq: Once | OROMUCOSAL | Status: AC
  Administered 2020-09-16: 15 mL via ORAL
  Filled 2020-09-16: qty 15

## 2020-09-16 MED ORDER — POTASSIUM CHLORIDE 10 MEQ/100ML IV SOLN
10.0000 meq | INTRAVENOUS | Status: AC
  Administered 2020-09-16 (×4): 10 meq via INTRAVENOUS
  Filled 2020-09-16 (×4): qty 100

## 2020-09-16 MED ORDER — ALUM & MAG HYDROXIDE-SIMETH 200-200-20 MG/5ML PO SUSP
30.0000 mL | Freq: Once | ORAL | Status: AC
  Administered 2020-09-16: 30 mL via ORAL
  Filled 2020-09-16: qty 30

## 2020-09-16 NOTE — Progress Notes (Signed)
PROGRESS NOTE    Ellen [name]  [id number] DOB: February 09, 1996 DOA: 09/09/2020 PCP: Merryl Hacker, No     Brief Narrative:  Ellen [name] is a 25 year old female with PMH significant for carcinoma of left breast, ADHD, anxiety who presented to the ED with complaints of persistent nausea, vomiting and diarrhea.  Patient has recently completed 10-day course of chemotherapy.  During this time she has developed diarrhea intermittently, but it became persistent for last 4 days. Patient also reported during this time, she had 2 rounds of chemo with the most recent being 2 days ago. She has developed persistent nausea and vomiting and decreased p.o. intake. She was unable to keep anything down. She was found to be in sinus tachycardia, she was given IV fluids which improved the heart rate. Serum creatinine found to be 3.36 which is improved with IV hydration.  Patient was found to be neutropenic but afebrile.  New events last 24 hours / Subjective: Tolerating full liquid diet, no vomiting, diarrhea about the same. Overall doing better.   Assessment & Plan:   Active Problems:   AKI (acute kidney injury) ([place])   Intractable nausea, vomiting and diarrhea in the setting of chemotherapy for left breast cancer: Patient recently has completed 10-day course of chemotherapy. She has developed persistent nausea, vomiting and diarrhea during that course. Stool for C Diff : Negative, Continue Imodium as needed. Heme states she is having Perjeta associated diarrhea , typhlitis unlikely given no leukocytosis, remains afebrile. Treatment is mainly supportive, continue Imodium, colestipol or Questran Advance to soft diet    AKI secondary to dehydration:  Resolved    Hypokalemia: Replace   Hyponatremia: Stable    Prolonged QT interval: Avoid Zofran and Reglan. Repeat EKG normalization of QT prolongation.   History of breast cancer on chemo: Followed by Dr. Lindi Adie    Leukopenia/  thrombocytopenia/neutropenia: Resolved    DVT prophylaxis:  SCDs Start: 09/10/20 0013  Code Status:     Code Status Orders  (From admission, onward)           Start     Ordered   09/10/20 0013  Full code  Continuous        09/10/20 0012           Code Status History     This patient has a current code status but no historical code status.      Family Communication: SO at bedside Disposition Plan:  Status is: Inpatient  Remains inpatient appropriate because:IV treatments appropriate due to intensity of illness or inability to take PO and Inpatient level of care appropriate due to severity of illness  Dispo: The patient is from: Home              Anticipated d/c is to: Home              Patient currently is not medically stable to d/c.   Difficult to place patient No      Consultants:  Oncology   Procedures:  None   Antimicrobials:  Anti-infectives (From admission, onward)    Start     Dose/Rate Route Frequency Ordered Stop   09/13/20 1700  acyclovir (ZOVIRAX) 315 mg in dextrose 5 % 100 mL IVPB        5 mg/kg  63.2 kg 106.3 mL/hr over 60 Minutes Intravenous Every 8 hours 09/13/20 1610          Objective: Vitals:   09/15/20 1340 09/15/20 1727 09/15/20 2132 09/16/20  0519  BP: 121/77 112/65 110/66 101/62  Pulse: (!) 122 (!) 117 (!) 110 (!) 109  Resp: '16 18 20 18  '$ Temp: 98.1 F (36.7 C) 100.2 F (37.9 C) 97.6 F (36.4 C) 99 F (37.2 C)  TempSrc: Oral Oral  Oral  SpO2: 99% 98% 98% 96%    Intake/Output Summary (Last 24 hours) at 09/16/2020 1139 Last data filed at 09/16/2020 0900 Gross per 24 hour  Intake 1329.34 ml  Output --  Net 1329.34 ml    There were no vitals filed for this visit.   Examination: General exam: Appears calm and comfortable  Respiratory system: Clear to auscultation. Respiratory effort normal. Cardiovascular system: S1 & S2 heard, tachycardic, regular rhythm. No pedal edema. Gastrointestinal system: Abdomen is  nondistended, soft and nontender. Normal bowel sounds heard. Central nervous system: Alert and oriented. Non focal exam. Speech clear  Extremities: Symmetric in appearance bilaterally  Skin: No rashes, lesions or ulcers on exposed skin  Psychiatry: Judgement and insight appear stable. Mood & affect appropriate.     Data Reviewed: I have personally reviewed following labs and imaging studies  CBC: Recent Labs  Lab 09/09/20 1438 09/10/20 0503 09/11/20 0452 09/12/20 0525 09/13/20 0418 09/14/20 0345 09/15/20 0324 09/16/20 0318  WBC 1.3*   < > 6.2 8.7 12.2* 10.7* 9.0 8.8  NEUTROABS 0.7*  --  4.4 6.7  --   --   --   --   HGB 16.1*   < > 12.7 11.3* 10.6* 10.1* 9.3* 9.3*  HCT 43.8   < > 35.7* 32.4* 30.7* 29.6* 27.5* 28.1*  MCV 82.2   < > 84.4 85.7 86.7 87.8 88.4 90.9  PLT 120*   < > 72* 62* 81* 117* 134* 167   < > = values in this interval not displayed.    Basic Metabolic Panel: Recent Labs  Lab 09/09/20 1438 09/10/20 0503 09/11/20 0452 09/12/20 0525 09/13/20 0418 09/14/20 0345 09/15/20 0324 09/16/20 0318  NA 126*   < > 127* 130* 129* 132* 130* 129*  K 3.3*   < > 3.7 3.0* 3.0* 3.2* 3.3* 3.5  CL 87*   < > 95* 95* 98 98 98 99  CO2 19*   < > 19* '22 22 25 24 24  '$ GLUCOSE 139*   < > 100* 98 98 102* 93 103*  BUN 36*   < > '14 8 7 7 '$ <5* <5*  CREATININE 3.39*   < > 0.55 0.50 0.45 0.37* <0.30* 0.42*  CALCIUM 7.6*   < > 7.5* 7.7* 7.4* 7.5* 7.6* 7.6*  MG 2.3  --  2.6* 2.3 2.1  --   --   --   PHOS  --   --  2.0* 1.8*  --   --   --   --    < > = values in this interval not displayed.    GFR: Estimated Creatinine Clearance: 96.7 mL/min (A) (by C-G formula based on SCr of 0.42 mg/dL (L)). Liver Function Tests: Recent Labs  Lab 09/10/20 0503 09/11/20 1330 09/12/20 0525 09/15/20 0324 09/16/20 0318  AST '16 19 22 '$ 51* 38  ALT '22 20 22 '$ 54* 52*  ALKPHOS 69 62 63 171* 171*  BILITOT 4.2* 5.1* 5.0* 3.8* 3.4*  PROT 5.4* 4.8* 4.6* 4.7* 5.0*  ALBUMIN 2.2* 1.8* 1.8* 2.0* 2.1*     Recent Labs  Lab 09/09/20 1438  LIPASE 15    No results for input(s): AMMONIA in the last 168 hours. Coagulation Profile: No results  for input(s): INR, PROTIME in the last 168 hours. Cardiac Enzymes: No results for input(s): CKTOTAL, CKMB, CKMBINDEX, TROPONINI in the last 168 hours. BNP (last 3 results) No results for input(s): PROBNP in the last 8760 hours. HbA1C: No results for input(s): HGBA1C in the last 72 hours. CBG: No results for input(s): GLUCAP in the last 168 hours. Lipid Profile: No results for input(s): CHOL, HDL, LDLCALC, TRIG, CHOLHDL, LDLDIRECT in the last 72 hours. Thyroid Function Tests: No results for input(s): TSH, T4TOTAL, FREET4, T3FREE, THYROIDAB in the last 72 hours. Anemia Panel: No results for input(s): VITAMINB12, FOLATE, FERRITIN, TIBC, IRON, RETICCTPCT in the last 72 hours. Sepsis Labs: No results for input(s): PROCALCITON, LATICACIDVEN in the last 168 hours.  Recent Results (from the past 240 hour(s))  Resp Panel by RT-PCR (Flu A&B, Covid) Nasopharyngeal Swab     Status: None   Collection Time: 09/09/20  5:25 PM   Specimen: Nasopharyngeal Swab; Nasopharyngeal(NP) swabs in vial transport medium  Result Value Ref Range Status   SARS Coronavirus 2 by RT PCR NEGATIVE NEGATIVE Final    Comment: (NOTE) SARS-CoV-2 target nucleic acids are NOT DETECTED.  The SARS-CoV-2 RNA is generally detectable in upper respiratory specimens during the acute phase of infection. The lowest concentration of SARS-CoV-2 viral copies this assay can detect is 138 copies/mL. A negative result does not preclude SARS-Cov-2 infection and should not be used as the sole basis for treatment or other patient management decisions. A negative result may occur with  improper specimen collection/handling, submission of specimen other than nasopharyngeal swab, presence of viral mutation(s) within the areas targeted by this assay, and inadequate number of viral copies(<138  copies/mL). A negative result must be combined with clinical observations, patient history, and epidemiological information. The expected result is Negative.  Fact Sheet for Patients:  EntrepreneurPulse.com.au  Fact Sheet for Healthcare Providers:  IncredibleEmployment.be  This test is no t yet approved or cleared by the [place] FDA and  has been authorized for detection and/or diagnosis of SARS-CoV-2 by FDA under an Emergency Use Authorization (EUA). This EUA will remain  in effect (meaning this test can be used) for the duration of the COVID-19 declaration under Section 564(b)(1) of the Act, 21 U.S.C.section 360bbb-3(b)(1), unless the authorization is terminated  or revoked sooner.       Influenza A by PCR NEGATIVE NEGATIVE Final   Influenza B by PCR NEGATIVE NEGATIVE Final    Comment: (NOTE) The Xpert Xpress SARS-CoV-2/FLU/RSV plus assay is intended as an aid in the diagnosis of influenza from Nasopharyngeal swab specimens and should not be used as a sole basis for treatment. Nasal washings and aspirates are unacceptable for Xpert Xpress SARS-CoV-2/FLU/RSV testing.  Fact Sheet for Patients: EntrepreneurPulse.com.au  Fact Sheet for Healthcare Providers: IncredibleEmployment.be  This test is not yet approved or cleared by the [place] FDA and has been authorized for detection and/or diagnosis of SARS-CoV-2 by FDA under an Emergency Use Authorization (EUA). This EUA will remain in effect (meaning this test can be used) for the duration of the COVID-19 declaration under Section 564(b)(1) of the Act, 21 U.S.C. section 360bbb-3(b)(1), unless the authorization is terminated or revoked.  Performed at [facility], [place] [address] [place], [place] [address]   C Difficile Quick Screen w PCR reflex     Status: None   Collection Time: 09/10/20 12:43 PM   Specimen: STOOL  Result  Value Ref Range Status   C Diff antigen NEGATIVE NEGATIVE Final  C Diff toxin NEGATIVE NEGATIVE Final   C Diff interpretation No C. difficile detected.  Final    Comment: Performed at [facility], [place] [address], [place], [place] [address]       Radiology Studies: No results found.    Scheduled Meds:  chlorhexidine  15 mL Mouth Rinse BID   Chlorhexidine Gluconate Cloth  6 each Topical Daily   LORazepam  1 mg Intravenous QHS   mouth rinse  15 mL Mouth Rinse q12n4p   pantoprazole (PROTONIX) IV  40 mg Intravenous Q24H   Continuous Infusions:  acyclovir 315 mg (09/16/20 0523)   potassium chloride 10 mEq (09/16/20 1108)     LOS: 7 days      Time spent: 25 minutes   Dessa Phi, DO [facility] 09/16/2020, 11:39 AM   Available via Epic secure chat 7am-7pm After these hours, please refer to coverage provider listed on [email or web address]

## 2020-09-17 ENCOUNTER — Encounter: Payer: Self-pay | Admitting: Hematology and Oncology

## 2020-09-17 ENCOUNTER — Encounter: Payer: Self-pay | Admitting: *Deleted

## 2020-09-17 LAB — COMPREHENSIVE METABOLIC PANEL
ALT: 52 U/L — ABNORMAL HIGH (ref 0–44)
AST: 33 U/L (ref 15–41)
Albumin: 2 g/dL — ABNORMAL LOW (ref 3.5–5.0)
Alkaline Phosphatase: 171 U/L — ABNORMAL HIGH (ref 38–126)
Anion gap: 7 (ref 5–15)
BUN: <5 mg/dL — ABNORMAL LOW (ref 6–20)
CO2: 26 mmol/L (ref 22–32)
Calcium: 7.7 mg/dL — ABNORMAL LOW (ref 8.9–10.3)
Chloride: 100 mmol/L (ref 98–111)
Creatinine, Ser: 0.38 mg/dL — ABNORMAL LOW (ref 0.44–1.00)
GFR, Estimated: >60 mL/min (ref >60)
Glucose, Bld: 104 mg/dL — ABNORMAL HIGH (ref 70–99)
Potassium: 3.2 mmol/L — ABNORMAL LOW (ref 3.5–5.1)
Sodium: 133 mmol/L — ABNORMAL LOW (ref 135–145)
Total Bilirubin: 2.5 mg/dL — ABNORMAL HIGH (ref 0.3–1.2)
Total Protein: 5.2 g/dL — ABNORMAL LOW (ref 6.5–8.1)

## 2020-09-17 LAB — CBC
HCT: 27.9 % — ABNORMAL LOW (ref 36.0–46.0)
Hemoglobin: 9.2 g/dL — ABNORMAL LOW (ref 12.0–15.0)
MCH: 30.1 pg (ref 26.0–34.0)
MCHC: 33 g/dL (ref 30.0–36.0)
MCV: 91.2 fL (ref 80.0–100.0)
Platelets: 175 10*3/uL (ref 150–400)
RBC: 3.06 MIL/uL — ABNORMAL LOW (ref 3.87–5.11)
RDW: 13.4 % (ref 11.5–15.5)
WBC: 7 10*3/uL (ref 4.0–10.5)
nRBC: 0.7 % — ABNORMAL HIGH (ref 0.0–0.2)

## 2020-09-17 MED ORDER — VALACYCLOVIR HCL 500 MG PO TABS
1000.0000 mg | ORAL_TABLET | Freq: Two times a day (BID) | ORAL | Status: DC
  Administered 2020-09-17 – 2020-09-19 (×4): 1000 mg via ORAL
  Filled 2020-09-17 (×4): qty 2

## 2020-09-17 MED ORDER — POTASSIUM CHLORIDE 20 MEQ PO PACK
40.0000 meq | PACK | ORAL | Status: AC
Start: 2020-09-17 — End: 2020-09-17
  Administered 2020-09-17 (×2): 40 meq via ORAL
  Filled 2020-09-17 (×2): qty 2

## 2020-09-17 MED ORDER — DIPHENOXYLATE-ATROPINE 2.5-0.025 MG PO TABS
1.0000 | ORAL_TABLET | Freq: Three times a day (TID) | ORAL | Status: DC
  Administered 2020-09-17 – 2020-09-19 (×5): 1 via ORAL
  Filled 2020-09-17 (×5): qty 1

## 2020-09-17 MED ORDER — TRAMADOL HCL 50 MG PO TABS
25.0000 mg | ORAL_TABLET | Freq: Four times a day (QID) | ORAL | Status: DC | PRN
  Administered 2020-09-17 – 2020-09-18 (×4): 25 mg via ORAL
  Filled 2020-09-17 (×4): qty 1

## 2020-09-17 MED ORDER — DIPHENOXYLATE-ATROPINE 2.5-0.025 MG PO TABS: 1.0000 | ORAL_TABLET | Freq: Three times a day (TID) | ORAL | Status: DC | PRN

## 2020-09-17 MED ORDER — TRAMADOL 5 MG/ML ORAL SUSPENSION: 25.0000 mg | Freq: Four times a day (QID) | ORAL | Status: DC | PRN

## 2020-09-17 MED ORDER — ADULT MULTIVITAMIN W/MINERALS CH
1.0000 | ORAL_TABLET | Freq: Every day | ORAL | Status: DC
  Administered 2020-09-17 – 2020-09-19 (×3): 1 via ORAL
  Filled 2020-09-17 (×3): qty 1

## 2020-09-17 NOTE — Progress Notes (Signed)
ONCOLOGY Patient had BMs so far. Its improving gradually I added Lomotil TID for the next 48 hours and after that PRN If that doesn't work, then we can add cholestipol Will follow

## 2020-09-17 NOTE — Progress Notes (Signed)
Nutrition Follow-up  INTERVENTION:   -Multivitamin with minerals daily  -Needs weight for admission  NUTRITION DIAGNOSIS:   Increased nutrient needs related to cancer and cancer related treatments as evidenced by estimated needs.  Ongoing.  GOAL:   Patient will meet greater than or equal to 90% of their needs  Progressing  MONITOR:   PO intake, Supplement acceptance, Labs, Weight trends, I & O's, Diet advancement   ASSESSMENT:   25 y.o. female with medical history significant of carcinoma of the left breast, ADHD, anxiety. Presenting with diarrhea, N/V. She has recently completed a 10-day stretch of XRT. During this time, she developed diarrhea intermittently. It became persistent about 4 days ago. She reports that also during this time, she's had 2 rounds of chemo -- her most recent being about 2 days ago. These sessions have led to nausea and poor PO intake. She had has significant N/V over the last 24 hours. She is no longer able to keep anything down.  Patient currently consuming 25-65% of meals. Pt continues to have diarrhea after eating. Boost Breeze was d/c on 7/26. Pt has not been on any protein supplements since. Now on soft diet.  Per weight records, no weight since 7/19 recorded.  Medications: KLOR-CON  Labs reviewed:  Low Na, K  Diet Order:   Diet Order             DIET SOFT Room service appropriate? Yes; Fluid consistency: Thin  Diet effective now                   EDUCATION NEEDS:   Not appropriate for education at this time  Skin:  Skin Assessment: Reviewed RN Assessment  Last BM:  PTA  Height:   Ht Readings from Last 1 Encounters:  09/04/20 '5\' 5"'$  (1.651 m)    Weight:   Wt Readings from Last 1 Encounters:  09/04/20 63.2 kg    BMI:  There is no height or weight on file to calculate BMI.  Estimated Nutritional Needs:   Kcal:  1900-2100  Protein:  95-105g  Fluid:  >2L/day   Clayton Bibles, MS, RD, LDN Inpatient Clinical  Dietitian Contact information available via Amion

## 2020-09-17 NOTE — Progress Notes (Signed)
PROGRESS NOTE    Ellen [name]  [id number] DOB: [birth year] DOA: 09/09/2020 PCP: Merryl Hacker, No     Brief Narrative:  Ellen [name] is a 25 year old female with PMH significant for carcinoma of left breast, ADHD, anxiety who presented to the ED with complaints of persistent nausea, vomiting and diarrhea.  Patient has recently completed 10-day course of chemotherapy.  During this time she has developed diarrhea intermittently, but it became persistent for last 4 days. Patient also reported during this time, she had 2 rounds of chemo with the most recent being 2 days ago. She has developed persistent nausea and vomiting and decreased p.o. intake. She was unable to keep anything down. She was found to be in sinus tachycardia, she was given IV fluids which improved the heart rate. Serum creatinine found to be 3.36 which is improved with IV hydration.  Patient was found to be neutropenic but afebrile.  New events last 24 hours / Subjective: Able to tolerate soft diet, still having some diarrhea.  She states that after she eats, she immediately has to go to the bathroom.  Having some abdominal cramping this morning as well.  Assessment & Plan:   Active Problems:   AKI (acute kidney injury) ([place])   Intractable nausea, vomiting and diarrhea in the setting of chemotherapy for left breast cancer: Patient recently has completed 10-day course of chemotherapy. She has developed persistent nausea, vomiting and diarrhea during that course. Stool for C Diff : Negative, Continue Imodium as needed. Heme states she is having Perjeta associated diarrhea , typhlitis unlikely given no leukocytosis, remains afebrile. Treatment is mainly supportive, continue Imodium, colestipol or Questran (holding colestipol due to slight elevation in LFT) Advance to soft diet    AKI secondary to dehydration:  Resolved    Hypokalemia: Replace   Hyponatremia: Stable    Prolonged QT interval: Avoid Zofran and  Reglan. Repeat EKG normalization of QT prolongation.   History of breast cancer on chemo: Followed by Dr. Lindi Adie    Leukopenia/ thrombocytopenia/neutropenia: Resolved    DVT prophylaxis:  SCDs Start: 09/10/20 0013  Code Status:     Code Status Orders  (From admission, onward)           Start     Ordered   09/10/20 0013  Full code  Continuous        09/10/20 0012           Code Status History     This patient has a current code status but no historical code status.      Family Communication: At bedside Disposition Plan:  Status is: Inpatient  Remains inpatient appropriate because:IV treatments appropriate due to intensity of illness or inability to take PO and Inpatient level of care appropriate due to severity of illness  Dispo: The patient is from: Home              Anticipated d/c is to: Home              Patient currently is not medically stable to d/c.  Hopeful discharge home tomorrow 8/2   Difficult to place patient No      Consultants:  Oncology   Procedures:  None   Antimicrobials:  Anti-infectives (From admission, onward)    Start     Dose/Rate Route Frequency Ordered Stop   09/17/20 2200  valACYclovir (VALTREX) tablet 1,000 mg        1,000 mg Oral 2 times daily 09/17/20 1000 09/21/20 2159  09/13/20 1700  acyclovir (ZOVIRAX) 315 mg in dextrose 5 % 100 mL IVPB        5 mg/kg  63.2 kg 106.3 mL/hr over 60 Minutes Intravenous Every 8 hours 09/13/20 1610 09/17/20 2159        Objective: Vitals:   09/15/20 2132 09/16/20 0519 09/16/20 2001 09/17/20 0526  BP: 110/66 101/62 114/70 111/72  Pulse: (!) 110 (!) 109 (!) 116 (!) 102  Resp: '20 18 20 20  '$ Temp: 97.6 F (36.4 C) 99 F (37.2 C) 97.8 F (36.6 C) 97.8 F (36.6 C)  TempSrc:  Oral Oral Oral  SpO2: 98% 96% 98% 98%    Intake/Output Summary (Last 24 hours) at 09/17/2020 1214 Last data filed at 09/17/2020 1030 Gross per 24 hour  Intake 1060.72 ml  Output --  Net 1060.72 ml     There were no vitals filed for this visit.  Examination: General exam: Appears calm and comfortable  Respiratory system: Clear to auscultation. Respiratory effort normal. Cardiovascular system: S1 & S2 heard, tachycardic, regular rhythm. No pedal edema. Gastrointestinal system: Abdomen is nondistended, soft and mildly tender to palpation mid abdomen Central nervous system: Alert and oriented. Non focal exam. Speech clear  Extremities: Symmetric in appearance bilaterally  Skin: No rashes, lesions or ulcers on exposed skin  Psychiatry: Judgement and insight appear stable. Mood & affect appropriate.  Tearful today    Data Reviewed: I have personally reviewed following labs and imaging studies  CBC: Recent Labs  Lab 09/11/20 0452 09/12/20 0525 09/13/20 0418 09/14/20 0345 09/15/20 0324 09/16/20 0318 09/17/20 0420  WBC 6.2 8.7 12.2* 10.7* 9.0 8.8 7.0  NEUTROABS 4.4 6.7  --   --   --   --   --   HGB 12.7 11.3* 10.6* 10.1* 9.3* 9.3* 9.2*  HCT 35.7* 32.4* 30.7* 29.6* 27.5* 28.1* 27.9*  MCV 84.4 85.7 86.7 87.8 88.4 90.9 91.2  PLT 72* 62* 81* 117* 134* 167 0000000    Basic Metabolic Panel: Recent Labs  Lab 09/11/20 0452 09/12/20 0525 09/13/20 0418 09/14/20 0345 09/15/20 0324 09/16/20 0318 09/17/20 0420  NA 127* 130* 129* 132* 130* 129* 133*  K 3.7 3.0* 3.0* 3.2* 3.3* 3.5 3.2*  CL 95* 95* 98 98 98 99 100  CO2 19* '22 22 25 24 24 26  '$ GLUCOSE 100* 98 98 102* 93 103* 104*  BUN '14 8 7 7 '$ <5* <5* <5*  CREATININE 0.55 0.50 0.45 0.37* <0.30* 0.42* 0.38*  CALCIUM 7.5* 7.7* 7.4* 7.5* 7.6* 7.6* 7.7*  MG 2.6* 2.3 2.1  --   --   --   --   PHOS 2.0* 1.8*  --   --   --   --   --     GFR: Estimated Creatinine Clearance: 96.7 mL/min (A) (by C-G formula based on SCr of 0.38 mg/dL (L)). Liver Function Tests: Recent Labs  Lab 09/11/20 1330 09/12/20 0525 09/15/20 0324 09/16/20 0318 09/17/20 0420  AST 19 22 51* 38 33  ALT 20 22 54* 52* 52*  ALKPHOS 62 63 171* 171* 171*  BILITOT  5.1* 5.0* 3.8* 3.4* 2.5*  PROT 4.8* 4.6* 4.7* 5.0* 5.2*  ALBUMIN 1.8* 1.8* 2.0* 2.1* 2.0*    No results for input(s): LIPASE, AMYLASE in the last 168 hours.  No results for input(s): AMMONIA in the last 168 hours. Coagulation Profile: No results for input(s): INR, PROTIME in the last 168 hours. Cardiac Enzymes: No results for input(s): CKTOTAL, CKMB, CKMBINDEX, TROPONINI in the last 168 hours.  BNP (last 3 results) No results for input(s): PROBNP in the last 8760 hours. HbA1C: No results for input(s): HGBA1C in the last 72 hours. CBG: No results for input(s): GLUCAP in the last 168 hours. Lipid Profile: No results for input(s): CHOL, HDL, LDLCALC, TRIG, CHOLHDL, LDLDIRECT in the last 72 hours. Thyroid Function Tests: No results for input(s): TSH, T4TOTAL, FREET4, T3FREE, THYROIDAB in the last 72 hours. Anemia Panel: No results for input(s): VITAMINB12, FOLATE, FERRITIN, TIBC, IRON, RETICCTPCT in the last 72 hours. Sepsis Labs: No results for input(s): PROCALCITON, LATICACIDVEN in the last 168 hours.  Recent Results (from the past 240 hour(s))  Resp Panel by RT-PCR (Flu A&B, Covid) Nasopharyngeal Swab     Status: None   Collection Time: 09/09/20  5:25 PM   Specimen: Nasopharyngeal Swab; Nasopharyngeal(NP) swabs in vial transport medium  Result Value Ref Range Status   SARS Coronavirus 2 by RT PCR NEGATIVE NEGATIVE Final    Comment: (NOTE) SARS-CoV-2 target nucleic acids are NOT DETECTED.  The SARS-CoV-2 RNA is generally detectable in upper respiratory specimens during the acute phase of infection. The lowest concentration of SARS-CoV-2 viral copies this assay can detect is 138 copies/mL. A negative result does not preclude SARS-Cov-2 infection and should not be used as the sole basis for treatment or other patient management decisions. A negative result may occur with  improper specimen collection/handling, submission of specimen other than nasopharyngeal swab, presence of  viral mutation(s) within the areas targeted by this assay, and inadequate number of viral copies(<138 copies/mL). A negative result must be combined with clinical observations, patient history, and epidemiological information. The expected result is Negative.  Fact Sheet for Patients:  EntrepreneurPulse.com.au  Fact Sheet for Healthcare Providers:  IncredibleEmployment.be  This test is no t yet approved or cleared by the [place] FDA and  has been authorized for detection and/or diagnosis of SARS-CoV-2 by FDA under an Emergency Use Authorization (EUA). This EUA will remain  in effect (meaning this test can be used) for the duration of the COVID-19 declaration under Section 564(b)(1) of the Act, 21 U.S.C.section 360bbb-3(b)(1), unless the authorization is terminated  or revoked sooner.       Influenza A by PCR NEGATIVE NEGATIVE Final   Influenza B by PCR NEGATIVE NEGATIVE Final    Comment: (NOTE) The Xpert Xpress SARS-CoV-2/FLU/RSV plus assay is intended as an aid in the diagnosis of influenza from Nasopharyngeal swab specimens and should not be used as a sole basis for treatment. Nasal washings and aspirates are unacceptable for Xpert Xpress SARS-CoV-2/FLU/RSV testing.  Fact Sheet for Patients: EntrepreneurPulse.com.au  Fact Sheet for Healthcare Providers: IncredibleEmployment.be  This test is not yet approved or cleared by the [place] FDA and has been authorized for detection and/or diagnosis of SARS-CoV-2 by FDA under an Emergency Use Authorization (EUA). This EUA will remain in effect (meaning this test can be used) for the duration of the COVID-19 declaration under Section 564(b)(1) of the Act, 21 U.S.C. section 360bbb-3(b)(1), unless the authorization is terminated or revoked.  Performed at [facility], [place] [address], [place], [place] [address]   C Difficile Quick  Screen w PCR reflex     Status: None   Collection Time: 09/10/20 12:43 PM   Specimen: STOOL  Result Value Ref Range Status   C Diff antigen NEGATIVE NEGATIVE Final   C Diff toxin NEGATIVE NEGATIVE Final   C Diff interpretation No C. difficile detected.  Final    Comment: Performed at [facility]  [place] [address], [place], [place] [address]       Radiology Studies: No results found.    Scheduled Meds:  chlorhexidine  15 mL Mouth Rinse BID   Chlorhexidine Gluconate Cloth  6 each Topical Daily   LORazepam  1 mg Intravenous QHS   mouth rinse  15 mL Mouth Rinse q12n4p   pantoprazole (PROTONIX) IV  40 mg Intravenous Q24H   valACYclovir  1,000 mg Oral BID   Continuous Infusions:  acyclovir 106 mL/hr at 09/17/20 0600     LOS: 8 days      Time spent: 25 minutes   Dessa Phi, DO [facility] 09/17/2020, 12:14 PM   Available via Epic secure chat 7am-7pm After these hours, please refer to coverage provider listed on [email or web address]

## 2020-09-18 ENCOUNTER — Encounter (HOSPITAL_COMMUNITY): Payer: Self-pay | Admitting: Internal Medicine

## 2020-09-18 LAB — COMPREHENSIVE METABOLIC PANEL
ALT: 48 U/L — ABNORMAL HIGH (ref 0–44)
AST: 25 U/L (ref 15–41)
Albumin: 2.2 g/dL — ABNORMAL LOW (ref 3.5–5.0)
Alkaline Phosphatase: 182 U/L — ABNORMAL HIGH (ref 38–126)
Anion gap: 7 (ref 5–15)
BUN: <5 mg/dL — ABNORMAL LOW (ref 6–20)
CO2: 23 mmol/L (ref 22–32)
Calcium: 7.9 mg/dL — ABNORMAL LOW (ref 8.9–10.3)
Chloride: 101 mmol/L (ref 98–111)
Creatinine, Ser: 0.37 mg/dL — ABNORMAL LOW (ref 0.44–1.00)
GFR, Estimated: >60 mL/min (ref >60)
Glucose, Bld: 113 mg/dL — ABNORMAL HIGH (ref 70–99)
Potassium: 3.5 mmol/L (ref 3.5–5.1)
Sodium: 131 mmol/L — ABNORMAL LOW (ref 135–145)
Total Bilirubin: 2.1 mg/dL — ABNORMAL HIGH (ref 0.3–1.2)
Total Protein: 5.8 g/dL — ABNORMAL LOW (ref 6.5–8.1)

## 2020-09-18 LAB — CBC
HCT: 31.1 % — ABNORMAL LOW (ref 36.0–46.0)
Hemoglobin: 10.2 g/dL — ABNORMAL LOW (ref 12.0–15.0)
MCH: 30.4 pg (ref 26.0–34.0)
MCHC: 32.8 g/dL (ref 30.0–36.0)
MCV: 92.8 fL (ref 80.0–100.0)
Platelets: 232 10*3/uL (ref 150–400)
RBC: 3.35 MIL/uL — ABNORMAL LOW (ref 3.87–5.11)
RDW: 13.5 % (ref 11.5–15.5)
WBC: 7.3 10*3/uL (ref 4.0–10.5)
nRBC: 0.4 % — ABNORMAL HIGH (ref 0.0–0.2)

## 2020-09-18 MED ORDER — PANTOPRAZOLE SODIUM 40 MG PO TBEC
40.0000 mg | DELAYED_RELEASE_TABLET | Freq: Every day | ORAL | Status: DC
  Administered 2020-09-18 – 2020-09-19 (×2): 40 mg via ORAL
  Filled 2020-09-18 (×2): qty 1

## 2020-09-18 MED ORDER — POTASSIUM CHLORIDE 20 MEQ PO PACK
40.0000 meq | PACK | Freq: Once | ORAL | Status: AC
  Administered 2020-09-18: 40 meq via ORAL
  Filled 2020-09-18: qty 2

## 2020-09-18 MED ORDER — LORAZEPAM 1 MG PO TABS
1.0000 mg | ORAL_TABLET | Freq: Every evening | ORAL | Status: DC | PRN
  Administered 2020-09-18: 1 mg via ORAL
  Filled 2020-09-18: qty 1

## 2020-09-18 NOTE — Progress Notes (Signed)
PROGRESS NOTE    Ellen [name]  [id number] DOB: [birth year] DOA: 09/09/2020 PCP: Merryl Hacker, No     Brief Narrative:  Ellen [name] is a 25 year old female with PMH significant for carcinoma of left breast, ADHD, anxiety who presented to the ED with complaints of persistent nausea, vomiting and diarrhea.  Patient has recently completed 10-day course of chemotherapy.  During this time she has developed diarrhea intermittently, but it became persistent for last 4 days. Patient also reported during this time, she had 2 rounds of chemo with the most recent being 2 days ago. She has developed persistent nausea and vomiting and decreased p.o. intake. She was unable to keep anything down. She was found to be in sinus tachycardia, she was given IV fluids which improved the heart rate. Serum creatinine found to be 3.36 which is improved with IV hydration.  Patient was found to be neutropenic but afebrile.  New events last 24 hours / Subjective: Tolerating diet okay, thinks that her diarrhea has improved with initiation of Lomotil last night by Dr. Lindi Adie.  Had 2 episodes of diarrhea last night, 1 so far this morning.  Still having dry mouth/throat  Assessment & Plan:   Active Problems:   AKI (acute kidney injury) ([place])   Intractable nausea, vomiting and diarrhea in the setting of chemotherapy for left breast cancer: Patient recently has completed 10-day course of chemotherapy. She has developed persistent nausea, vomiting and diarrhea during that course. Stool for C Diff : Negative, Continue Imodium as needed. Heme states she is having Perjeta associated diarrhea , typhlitis unlikely given no leukocytosis, remains afebrile. Treatment is mainly supportive, continue Imodium, colestipol or Questran (holding colestipol due to slight elevation in LFT) Advance to soft diet  Continue Lomotil   AKI secondary to dehydration:  Resolved     Hyponatremia: Stable    Prolonged QT interval: Avoid  Zofran and Reglan. Repeat EKG normalization of QT prolongation.   History of breast cancer on chemo: Followed by Dr. Lindi Adie    Leukopenia/ thrombocytopenia/neutropenia: Resolved    DVT prophylaxis:  SCDs Start: 09/10/20 0013  Code Status:     Code Status Orders  (From admission, onward)           Start     Ordered   09/10/20 0013  Full code  Continuous        09/10/20 0012           Code Status History     This patient has a current code status but no historical code status.      Family Communication: At bedside Disposition Plan:  Status is: Inpatient  Remains inpatient appropriate because:IV treatments appropriate due to intensity of illness or inability to take PO and Inpatient level of care appropriate due to severity of illness  Dispo: The patient is from: Home              Anticipated d/c is to: Home              Patient currently is not medically stable to d/c.  Hopeful discharge home tomorrow 8/3   Difficult to place patient No      Consultants:  Oncology   Procedures:  None   Antimicrobials:  Anti-infectives (From admission, onward)    Start     Dose/Rate Route Frequency Ordered Stop   09/17/20 2200  valACYclovir (VALTREX) tablet 1,000 mg        1,000 mg Oral 2 times daily 09/17/20 1000  09/21/20 2159   09/13/20 1700  acyclovir (ZOVIRAX) 315 mg in dextrose 5 % 100 mL IVPB        5 mg/kg  63.2 kg 106.3 mL/hr over 60 Minutes Intravenous Every 8 hours 09/13/20 1610 09/18/20 0700        Objective: Vitals:   09/18/20 0600 09/18/20 0755 09/18/20 0800 09/18/20 1000  BP:  118/81    Pulse:  (!) 104    Resp: (!) '21 14 19 17  '$ Temp:  98.7 F (37.1 C)    TempSrc:  Oral    SpO2:        Intake/Output Summary (Last 24 hours) at 09/18/2020 1201 Last data filed at 09/18/2020 1000 Gross per 24 hour  Intake 374.06 ml  Output --  Net 374.06 ml    There were no vitals filed for this visit.  Examination: General exam: Appears calm and  comfortable  Respiratory system: Clear to auscultation. Respiratory effort normal. Cardiovascular system: S1 & S2 heard, tachycardic, regular rhythm. No pedal edema. Gastrointestinal system: Abdomen is nondistended, soft and nontender. Normal bowel sounds heard. Central nervous system: Alert and oriented. Non focal exam. Speech clear  Extremities: Symmetric in appearance bilaterally  Skin: No rashes, lesions or ulcers on exposed skin  Psychiatry: Judgement and insight appear stable. Mood & affect appropriate.     Data Reviewed: I have personally reviewed following labs and imaging studies  CBC: Recent Labs  Lab 09/12/20 0525 09/13/20 0418 09/14/20 0345 09/15/20 0324 09/16/20 0318 09/17/20 0420 09/18/20 0125  WBC 8.7   < > 10.7* 9.0 8.8 7.0 7.3  NEUTROABS 6.7  --   --   --   --   --   --   HGB 11.3*   < > 10.1* 9.3* 9.3* 9.2* 10.2*  HCT 32.4*   < > 29.6* 27.5* 28.1* 27.9* 31.1*  MCV 85.7   < > 87.8 88.4 90.9 91.2 92.8  PLT 62*   < > 117* 134* 167 175 232   < > = values in this interval not displayed.    Basic Metabolic Panel: Recent Labs  Lab 09/12/20 0525 09/13/20 0418 09/14/20 0345 09/15/20 0324 09/16/20 0318 09/17/20 0420 09/18/20 0125  NA 130* 129* 132* 130* 129* 133* 131*  K 3.0* 3.0* 3.2* 3.3* 3.5 3.2* 3.5  CL 95* 98 98 98 99 100 101  CO2 '22 22 25 24 24 26 23  '$ GLUCOSE 98 98 102* 93 103* 104* 113*  BUN '8 7 7 '$ <5* <5* <5* <5*  CREATININE 0.50 0.45 0.37* <0.30* 0.42* 0.38* 0.37*  CALCIUM 7.7* 7.4* 7.5* 7.6* 7.6* 7.7* 7.9*  MG 2.3 2.1  --   --   --   --   --   PHOS 1.8*  --   --   --   --   --   --     GFR: CrCl cannot be calculated (Unknown ideal weight.). Liver Function Tests: Recent Labs  Lab 09/12/20 0525 09/15/20 0324 09/16/20 0318 09/17/20 0420 09/18/20 0125  AST 22 51* 38 33 25  ALT 22 54* 52* 52* 48*  ALKPHOS 63 171* 171* 171* 182*  BILITOT 5.0* 3.8* 3.4* 2.5* 2.1*  PROT 4.6* 4.7* 5.0* 5.2* 5.8*  ALBUMIN 1.8* 2.0* 2.1* 2.0* 2.2*    No  results for input(s): LIPASE, AMYLASE in the last 168 hours.  No results for input(s): AMMONIA in the last 168 hours. Coagulation Profile: No results for input(s): INR, PROTIME in the last 168 hours. Cardiac Enzymes:  No results for input(s): CKTOTAL, CKMB, CKMBINDEX, TROPONINI in the last 168 hours. BNP (last 3 results) No results for input(s): PROBNP in the last 8760 hours. HbA1C: No results for input(s): HGBA1C in the last 72 hours. CBG: No results for input(s): GLUCAP in the last 168 hours. Lipid Profile: No results for input(s): CHOL, HDL, LDLCALC, TRIG, CHOLHDL, LDLDIRECT in the last 72 hours. Thyroid Function Tests: No results for input(s): TSH, T4TOTAL, FREET4, T3FREE, THYROIDAB in the last 72 hours. Anemia Panel: No results for input(s): VITAMINB12, FOLATE, FERRITIN, TIBC, IRON, RETICCTPCT in the last 72 hours. Sepsis Labs: No results for input(s): PROCALCITON, LATICACIDVEN in the last 168 hours.  Recent Results (from the past 240 hour(s))  Resp Panel by RT-PCR (Flu A&B, Covid) Nasopharyngeal Swab     Status: None   Collection Time: 09/09/20  5:25 PM   Specimen: Nasopharyngeal Swab; Nasopharyngeal(NP) swabs in vial transport medium  Result Value Ref Range Status   SARS Coronavirus 2 by RT PCR NEGATIVE NEGATIVE Final    Comment: (NOTE) SARS-CoV-2 target nucleic acids are NOT DETECTED.  The SARS-CoV-2 RNA is generally detectable in upper respiratory specimens during the acute phase of infection. The lowest concentration of SARS-CoV-2 viral copies this assay can detect is 138 copies/mL. A negative result does not preclude SARS-Cov-2 infection and should not be used as the sole basis for treatment or other patient management decisions. A negative result may occur with  improper specimen collection/handling, submission of specimen other than nasopharyngeal swab, presence of viral mutation(s) within the areas targeted by this assay, and inadequate number of viral copies(<138  copies/mL). A negative result must be combined with clinical observations, patient history, and epidemiological information. The expected result is Negative.  Fact Sheet for Patients:  EntrepreneurPulse.com.au  Fact Sheet for Healthcare Providers:  IncredibleEmployment.be  This test is no t yet approved or cleared by the [place] FDA and  has been authorized for detection and/or diagnosis of SARS-CoV-2 by FDA under an Emergency Use Authorization (EUA). This EUA will remain  in effect (meaning this test can be used) for the duration of the COVID-19 declaration under Section 564(b)(1) of the Act, 21 U.S.C.section 360bbb-3(b)(1), unless the authorization is terminated  or revoked sooner.       Influenza A by PCR NEGATIVE NEGATIVE Final   Influenza B by PCR NEGATIVE NEGATIVE Final    Comment: (NOTE) The Xpert Xpress SARS-CoV-2/FLU/RSV plus assay is intended as an aid in the diagnosis of influenza from Nasopharyngeal swab specimens and should not be used as a sole basis for treatment. Nasal washings and aspirates are unacceptable for Xpert Xpress SARS-CoV-2/FLU/RSV testing.  Fact Sheet for Patients: EntrepreneurPulse.com.au  Fact Sheet for Healthcare Providers: IncredibleEmployment.be  This test is not yet approved or cleared by the [place] FDA and has been authorized for detection and/or diagnosis of SARS-CoV-2 by FDA under an Emergency Use Authorization (EUA). This EUA will remain in effect (meaning this test can be used) for the duration of the COVID-19 declaration under Section 564(b)(1) of the Act, 21 U.S.C. section 360bbb-3(b)(1), unless the authorization is terminated or revoked.  Performed at [facility], [place] [address], [place], [place] [address]   C Difficile Quick Screen w PCR reflex     Status: None   Collection Time: 09/10/20 12:43 PM   Specimen: STOOL  Result  Value Ref Range Status   C Diff antigen NEGATIVE NEGATIVE Final   C Diff toxin NEGATIVE NEGATIVE Final   C Diff interpretation  No C. difficile detected.  Final    Comment: Performed at [facility], [place] [address], [place], [place] [address]       Radiology Studies: No results found.    Scheduled Meds:  chlorhexidine  15 mL Mouth Rinse BID   Chlorhexidine Gluconate Cloth  6 each Topical Daily   diphenoxylate-atropine  1 tablet Oral TID   LORazepam  1 mg Intravenous QHS   mouth rinse  15 mL Mouth Rinse q12n4p   multivitamin with minerals  1 tablet Oral Daily   pantoprazole (PROTONIX) IV  40 mg Intravenous Q24H   valACYclovir  1,000 mg Oral BID   Continuous Infusions:     LOS: 9 days      Time spent: 25 minutes   Dessa Phi, DO [facility] 09/18/2020, 12:01 PM   Available via Epic secure chat 7am-7pm After these hours, please refer to coverage provider listed on [email or web address]

## 2020-09-18 NOTE — Progress Notes (Signed)
   09/17/20 2048  Assess: MEWS Score  Temp 98 F (36.7 C)  BP 111/76  Pulse Rate (!) 117  Resp 20  Level of Consciousness Alert  SpO2 99 %  O2 Device Room Air  Assess: MEWS Score  MEWS Temp 0  MEWS Systolic 0  MEWS Pulse 2  MEWS RR 0  MEWS LOC 0  MEWS Score 2  MEWS Score Color Yellow  Assess: if the MEWS score is Yellow or Red  Were vital signs taken at a resting state? Yes  Focused Assessment No change from prior assessment  Does the patient meet 2 or more of the SIRS criteria? No  Does the patient have a confirmed or suspected source of infection? No  MEWS guidelines implemented *See Row Information* Yes  Treat  MEWS Interventions Administered prn meds/treatments  Pain Scale 0-10  Pain Score 5  Pain Type Acute pain  Pain Location Back  Pain Orientation Lower  Pain Onset On-going  Pain Intervention(s) Emotional support  Take Vital Signs  Increase Vital Sign Frequency  Yellow: Q 2hr X 2 then Q 4hr X 2, if remains yellow, continue Q 4hrs  Escalate  MEWS: Escalate Yellow: discuss with charge nurse/RN and consider discussing with provider and RRT  Notify: Charge Nurse/RN  Name of Charge Nurse/RN Notified Debbie, RN  Date Charge Nurse/RN Notified 09/17/20  Time Charge Nurse/RN Notified 2100  Document  Patient Outcome Stabilized after interventions  Progress note created (see row info) Yes  Assess: SIRS CRITERIA  SIRS Temperature  0  SIRS Pulse 1  SIRS Respirations  0  SIRS WBC 0  SIRS Score Sum  1

## 2020-09-18 NOTE — Progress Notes (Signed)
Physical Therapy Treatment Patient Details Name: Ellen [name] MRN: [id number] DOB: 17-Jul-1995 Today's Date: 09/18/2020    History of Present Illness Pt is a 25 y.o. female who presented to the ED with complaints of persistent nausea, vomiting and diarrhea.  PMH  significant for carcinoma of left breast, ADHD, anxiety    PT Comments    Pt ambulated 400' without an assistive device, no loss of balance, HR 139 max (pt denied palpitations, dyspnea, lightheadedness and stated her HR is high at baseline). She is mobilizing independently. PT goals have been met. She is safe to ambulate in the halls without assistance. I encouraged her to walk 2-3x/day to minimize deconditioning during hospitalization. PT signing off.     Follow Up Recommendations  No PT follow up (Anticipate pt will progress well with increased mobility)     Equipment Recommendations  None recommended by PT    Recommendations for Other Services       Precautions / Restrictions Precautions Precautions: Other (comment) Precaution Comments: chemotherapy precautions, frequent diarrhea Restrictions Weight Bearing Restrictions: No    Mobility  Bed Mobility Overal bed mobility: Independent                  Transfers Overall transfer level: Independent   Transfers: Sit to/from Stand Sit to Stand: Independent            Ambulation/Gait Ambulation/Gait assistance: Independent Gait Distance (Feet): 400 Feet Assistive device: None Gait Pattern/deviations: WFL(Within Functional Limits) Gait velocity: decr   General Gait Details: steady, no loss of balance, HR 139 max while walking, pt denied dizziness/palpitations/lightheadedness, no dyspnea, pt reports her HR has always been high even as a child (110s at rest)   Stairs             Wheelchair Mobility    Modified Rankin (Stroke Patients Only)       Balance Overall balance assessment: Independent                                           Cognition Arousal/Alertness: Awake/alert Behavior During Therapy: WFL for tasks assessed/performed Overall Cognitive Status: Within Functional Limits for tasks assessed                                        Exercises      General Comments        Pertinent Vitals/Pain Pain Score: 3  Pain Location: back ("its from the cancer") Pain Descriptors / Indicators: Constant;Aching Pain Intervention(s): Limited activity within patient's tolerance;Monitored during session (pt declined pain medication)    Home Living                      Prior Function            PT Goals (current goals can now be found in the care plan section) Acute Rehab PT Goals Patient Stated Goal: Be able to move around more often PT Goal Formulation: With patient Time For Goal Achievement: 09/28/20 Potential to Achieve Goals: Good Progress towards PT goals: Goals met/education completed, patient discharged from PT    Frequency    Min 3X/week      PT Plan Current plan remains appropriate    Co-evaluation  AM-PAC PT "6 Clicks" Mobility   Outcome Measure  Help needed turning from your back to your side while in a flat bed without using bedrails?: None Help needed moving from lying on your back to sitting on the side of a flat bed without using bedrails?: None Help needed moving to and from a bed to a chair (including a wheelchair)?: None Help needed standing up from a chair using your arms (e.g., wheelchair or bedside chair)?: None Help needed to walk in hospital room?: None Help needed climbing 3-5 steps with a railing? : None 6 Click Score: 24    End of Session Equipment Utilized During Treatment: Gait belt Activity Tolerance: Patient tolerated treatment well Patient left: with call bell/phone within reach;with family/visitor present;in bed Nurse Communication: Mobility status PT Visit Diagnosis: Unsteadiness on feet  (R26.81);Muscle weakness (generalized) (M62.81)     Time: 7579-7282 PT Time Calculation (min) (ACUTE ONLY): 8 min  Charges:  $Gait Training: 8-22 mins                    Blondell Reveal Kistler PT 09/18/2020  Acute Rehabilitation Services Pager [phone] Office [phone]

## 2020-09-19 ENCOUNTER — Other Ambulatory Visit (HOSPITAL_COMMUNITY): Payer: Self-pay

## 2020-09-19 LAB — COMPREHENSIVE METABOLIC PANEL
ALT: 43 U/L (ref 0–44)
AST: 26 U/L (ref 15–41)
Albumin: 2.3 g/dL — ABNORMAL LOW (ref 3.5–5.0)
Alkaline Phosphatase: 248 U/L — ABNORMAL HIGH (ref 38–126)
Anion gap: 6 (ref 5–15)
BUN: <5 mg/dL — ABNORMAL LOW (ref 6–20)
CO2: 23 mmol/L (ref 22–32)
Calcium: 8.2 mg/dL — ABNORMAL LOW (ref 8.9–10.3)
Chloride: 103 mmol/L (ref 98–111)
Creatinine, Ser: 0.3 mg/dL — ABNORMAL LOW (ref 0.44–1.00)
GFR, Estimated: >60 mL/min (ref >60)
Glucose, Bld: 101 mg/dL — ABNORMAL HIGH (ref 70–99)
Potassium: 3.7 mmol/L (ref 3.5–5.1)
Sodium: 132 mmol/L — ABNORMAL LOW (ref 135–145)
Total Bilirubin: 1.7 mg/dL — ABNORMAL HIGH (ref 0.3–1.2)
Total Protein: 6 g/dL — ABNORMAL LOW (ref 6.5–8.1)

## 2020-09-19 LAB — CBC
HCT: 32.6 % — ABNORMAL LOW (ref 36.0–46.0)
Hemoglobin: 10.4 g/dL — ABNORMAL LOW (ref 12.0–15.0)
MCH: 29.6 pg (ref 26.0–34.0)
MCHC: 31.9 g/dL (ref 30.0–36.0)
MCV: 92.9 fL (ref 80.0–100.0)
Platelets: 267 10*3/uL (ref 150–400)
RBC: 3.51 MIL/uL — ABNORMAL LOW (ref 3.87–5.11)
RDW: 13.5 % (ref 11.5–15.5)
WBC: 5.7 10*3/uL (ref 4.0–10.5)
nRBC: 0.5 % — ABNORMAL HIGH (ref 0.0–0.2)

## 2020-09-19 LAB — MAGNESIUM: Magnesium: 2.2 mg/dL (ref 1.7–2.4)

## 2020-09-19 MED ORDER — HEPARIN SOD (PORK) LOCK FLUSH 100 UNIT/ML IV SOLN
250.0000 [IU] | INTRAVENOUS | Status: AC | PRN
  Administered 2020-09-19: 250 [IU]
  Filled 2020-09-19: qty 2.5

## 2020-09-19 MED ORDER — DIPHENOXYLATE-ATROPINE 2.5-0.025 MG PO TABS
1.0000 | ORAL_TABLET | Freq: Three times a day (TID) | ORAL | 0 refills | Status: AC
  Filled 2020-09-19: qty 30, 10d supply, fill #0

## 2020-09-19 NOTE — Discharge Summary (Signed)
Physician Discharge Summary  Ellen [name] [id number] DOB: January 01, 1996 DOA: 09/09/2020  PCP: Pcp, No  Admit date: 09/09/2020 Discharge date: 09/19/2020  Admitted From: Home Disposition: Home  Recommendations for Outpatient Follow-up:  Follow-up as a scheduled by oncology office  Home Health: Not applicable Equipment/Devices: Not applicable  Discharge Condition: Stable CODE STATUS: Full code Diet recommendation: Regular diet.  Nutritional supplements.  Discharge summary: 25 year old with left breast cancer, ADHD and anxiety present to the ER with persistent nausea vomiting and diarrhea after completing a 10-day course of chemotherapy.  Had intermittent diarrhea resulting in clinical dehydration.  In the emergency room she was found with acute kidney injury with creatinine 3.36, tachycardic and unable to hydrate by mouth so admitted to the hospital.  Patient remained in the hospital due to persistent symptoms.  She was treated symptomatically with IV fluids, Imodium, Lomotil.  She received IV hydration.  Ultimately clinically stabilized and now with normal bowel function. She is eating regular diet, encouraged supplemental nutrition and enough fluid intake. She will use around-the-clock Lomotil 3 times a day until much clinical improvement. She will resume her long-term medications including nausea medications, anxiety medications at night. Patient has a scheduled follow-up with cancer center. Stable for discharge home today.   Discharge Diagnoses:  Active Problems:   AKI (acute kidney injury) [facility])    Discharge Instructions  Discharge Instructions     Call MD for:  difficulty breathing, headache or visual disturbances   Complete by: As directed    Call MD for:  persistant nausea and vomiting   Complete by: As directed    Call MD for:  severe uncontrolled pain   Complete by: As directed    Diet general   Complete by: As directed    Increase activity slowly    Complete by: As directed    No wound care   Complete by: As directed       Allergies as of 09/19/2020       Reactions   Other Other (See Comments)   Pt states that she is allergic to the fabric adhesives; It tears her skin.        Medication List     TAKE these medications    acyclovir 800 MG tablet Commonly known as: ZOVIRAX Take 800 mg by mouth 2 (two) times daily as needed (flare up).   acyclovir ointment 5 % Commonly known as: ZOVIRAX Apply 1 application topically 4 (four) times daily as needed. outbreak   amphetamine-dextroamphetamine 15 MG 24 hr capsule Commonly known as: ADDERALL XR Take 15 mg by mouth daily. Takes 15 mg on work days   cholestyramine 4 g packet Commonly known as: Questran Take 1 packet mixed with liquid by mouth 2 times daily as needed.   diphenoxylate-atropine 2.5-0.025 MG tablet Commonly known as: LOMOTIL Take 1 tablet by mouth 3 (three) times daily for 10 days. What changed:  when to take this reasons to take this   gabapentin 100 MG capsule Commonly known as: NEURONTIN Take 100 mg by mouth 3 (three) times daily as needed (sleep/anxiety).   levonorgestrel 19.5 MG IUD Commonly known as: KYLEENA by Intrauterine route.   lidocaine-prilocaine cream Commonly known as: EMLA Apply to affected area once   LORazepam 0.5 MG tablet Commonly known as: Ativan Take 1 tablet (0.5 mg total) by mouth at bedtime as needed for sleep.   MULTIVITAMINS PO Take 1 tablet by mouth daily.   ondansetron 8 MG tablet Commonly known as: Zofran Take 1  tablet (8 mg total) by mouth 2 (two) times daily as needed for refractory nausea / vomiting.   oxyCODONE-acetaminophen 5-325 MG tablet Commonly known as: PERCOCET/ROXICET Take 1 tablet by mouth every 8 (eight) hours as needed for severe pain.   prochlorperazine 10 MG tablet Commonly known as: COMPAZINE Take 1 tablet (10 mg total) by mouth every 6 (six) hours as needed for nausea or vomiting.         Allergies  Allergen Reactions   Other Other (See Comments)    Pt states that she is allergic to the fabric adhesives; It tears her skin.    Consultations: Oncology   Procedures/Studies: CT ABDOMEN PELVIS W CONTRAST  Result Date: 09/11/2020 CLINICAL DATA:  Diarrhea, nausea and vomiting, recent chemotherapy for known left breast carcinoma. EXAM: CT ABDOMEN AND PELVIS WITH CONTRAST TECHNIQUE: Multidetector CT imaging of the abdomen and pelvis was performed using the standard protocol following bolus administration of intravenous contrast. CONTRAST:  2m OMNIPAQUE IOHEXOL 350 MG/ML SOLN COMPARISON:  CT 08/15/2020 FINDINGS: Lower chest: Redemonstration of a left breast mass only partially visualized on this exam though suspect some interval decrease in size from comparison measuring 2.9 x 1.2 cm in maximal transaxial dimension within the level of imaging. Stable small ground-glass nodule seen in the anterior right middle lobe measuring 4 mm in size (4/39). No other visible concerning pulmonary nodules within the margins of imaging. Circumferential thickening and fluid-filled appearance of the distal thoracic esophagus. Normal heart size. No pericardial effusion. Terminus of a central venous catheter at the level of the right atrium. Hepatobiliary: Redemonstration of the multilobular, heterogeneous, hypoechoic focus in the anterior left lobe liver measuring 3.5 x 2.9 cm in size, decreased from 4.9 x 4.3 cm on comparison imaging when measured at a similar level. Additional smaller hypoattenuating foci seen in the anterior left and right lobes liver are similar to decreased size from comparison prior as well though incompletely characterized on this exam. Normal background parenchymal attenuation enhancement. Smooth surface contour. Normal gallbladder and biliary tree. Pancreas: No pancreatic ductal dilatation or surrounding inflammatory changes. Spleen: Normal in size. No concerning splenic lesions.  Adrenals/Urinary Tract: Normal adrenals. Kidneys are normally located with symmetric enhancement. No suspicious renal lesion, urolithiasis or hydronephrosis. Urinary bladder is unremarkable the degree of distention. Stomach/Bowel: Circumferentially thickened and fluid-filled distal thoracic esophagus with some mild mucosal hyperemia. Stomach is moderately fluid distended as well with some additional questionable thickening and mucosal hyperemia towards the distal body and antrum which may be accentuated by peristalsis. Duodenum and multiple sites throughout the small bowel demonstrate segmental regions of edematous mural thickening and mucosal hyperenhancement with generalized air and fluid distention without discrete focal transition point. Slightly more pronounced edematous changes and inflammation seen towards the terminal ileum as well. The colon is also diffusely fluid distended with some fairly focal thickening noted at the level of the rectosigmoid with a notable lack of formed stool. Vascular/Lymphatic: Central mesenteric nodes appear largely reactive without pathologically enlarged abdominopelvic adenopathy. No concerning vascular findings. Reproductive: Anteverted uterus with expected positioning of an IUD. No concerning adnexal mass or lesion. Other: Mild edematous changes of the mesenteric loops associated with segmentally thickened regions of small bowel including the duodenum, mid to distal small bowel and terminal ileum as well as the rectosigmoid. Small volume of free intraperitoneal fluid which is low-attenuation, favored to be reactive. No organized abscess or collection. No free air. No pneumatosis or portal venous gas. No bowel containing hernia. Musculoskeletal: Diffuse bony metastatic disease  throughout the imaged spine, pelvis and proximal femora including several larger expansile lesions for instance involving the left ilium which appears perhaps slightly increased in size from comparison  prior measuring 2.4 x 2.6 cm, previously 2.1 x 2.3 cm. Several lesions demonstrate some increasingly sclerotic change, possibly treatment related effect. There is a pathologic compression deformity noted at the L3 level with increasing height loss, 50% centrally. Additional superior endplate compression deformity at the L4 level with up to 30% height loss centrally. Some apparent moderate canal narrowing at the L3 level secondary to posterior retropulsion. IMPRESSION: Diffuse fluid distention of both large and small bowel with multifocal regions of long segmental edematous mural thickening and mucosal hyperemia concerning for an enterocolitis of infectious, or inflammatory etiology. No evidence of bowel obstruction or perforation at this time. Redemonstration of a left breast mass only partially included within the margins of imaging. Decreasing size of the conspicuous nodular hypoattenuating lesion in the anterior left lobe liver compatible with some treatment response. Osseous metastatic disease appears similar to progressive from prior with several lesions appearing increased in size as well as worsening pathologic compression deformities L3, L4 with retropulsion at the L3 level resulting in moderate canal stenosis. Stable 4 mm ground-glass nodule in the right middle lobe, indeterminate. Electronically Signed   By: Lovena Le M.D.   On: 09/11/2020 00:07   US RENAL  Result Date: 09/10/2020 CLINICAL DATA:  Acute kidney injury EXAM: RENAL / URINARY TRACT ULTRASOUND COMPLETE COMPARISON:  None. FINDINGS: Right Kidney: Renal measurements: 10.3 x 4.2 x 5.1 cm = volume: 114 mL. Echogenicity within normal limits. No mass or hydronephrosis visualized. Left Kidney: Renal measurements: 10.6 x 5.1 x 4.9 cm = volume: 140 mL. Echogenicity within normal limits. No mass or hydronephrosis visualized. Bladder: Appears normal for degree of bladder distention. Other: None. IMPRESSION: Normal renal ultrasound. Electronically  Signed   By: Ulyses Jarred M.D.   On: 09/10/2020 01:52   DG CHEST PORT 1 VIEW  Result Date: 08/23/2020 CLINICAL DATA:  Post central line placement. Placement of RIGHT-sided Port-A-Cath. EXAM: PORTABLE CHEST 1 VIEW COMPARISON:  Previous chest abdomen pelvis CT. FINDINGS: Trachea is midline. Cardiomediastinal contours and hilar structures are normal. Lungs are clear. No pneumothorax following placement of a RIGHT-sided Port-A-Cath which terminates at the caval to atrial junction. No sign of pleural effusion. On limited assessment no acute skeletal process. IMPRESSION: 1. No acute cardiopulmonary disease. 2. Placement of RIGHT sided Port-A-Cath as above. 3. No pneumothorax. Electronically Signed   By: Zetta Bills M.D.   On: 08/23/2020 09:14   DG Fluoro Guide CV Line-No Report  Result Date: 08/23/2020 Fluoroscopy was utilized by the requesting physician.  No radiographic interpretation.   ECHOCARDIOGRAM COMPLETE  Result Date: 08/30/2020    ECHOCARDIOGRAM REPORT   Patient Name:   FIDELIA HROMADKA Date of Exam: 08/30/2020 Medical Rec #:  [id number]          Height:       65.0 in Accession #:    [id number]         Weight:       142.4 lb Date of Birth:  [birth year]         BSA:          1.712 m Patient Age:    24 years           BP:           121/87 mmHg Patient Gender: F  HR:           88 bpm. Exam Location:  Outpatient Procedure: 2D Echo, Cardiac Doppler, Color Doppler and Strain Analysis Indications:    Chemo Z09  History:        Patient has no prior history of Echocardiogram examinations.                 Risk Factors:Non-Smoker.  Sonographer:    Vickie Epley RDCS Referring Phys: [id number] LINDSEY CORNETTO [name] IMPRESSIONS  1. Left ventricular ejection fraction, by estimation, is 60 to 65%. The left ventricle has normal function. The left ventricle has no regional wall motion abnormalities. Left ventricular diastolic parameters were normal. The average left ventricular global longitudinal strain  is -19.8 %. The global longitudinal strain is normal.  2. Right ventricular systolic function is normal. The right ventricular size is normal. There is normal pulmonary artery systolic pressure.  3. The mitral valve is normal in structure. No evidence of mitral valve regurgitation. No evidence of mitral stenosis.  4. The aortic valve is normal in structure. Aortic valve regurgitation is not visualized. No aortic stenosis is present.  5. The inferior vena cava is normal in size with greater than 50% respiratory variability, suggesting right atrial pressure of 3 mmHg. FINDINGS  Left Ventricle: Left ventricular ejection fraction, by estimation, is 60 to 65%. The left ventricle has normal function. The left ventricle has no regional wall motion abnormalities. The average left ventricular global longitudinal strain is -19.8 %. The global longitudinal strain is normal. The left ventricular internal cavity size was normal in size. There is no left ventricular hypertrophy. Left ventricular diastolic parameters were normal. Right Ventricle: The right ventricular size is normal. No increase in right ventricular wall thickness. Right ventricular systolic function is normal. There is normal pulmonary artery systolic pressure. The tricuspid regurgitant velocity is 1.94 m/s, and  with an assumed right atrial pressure of 3 mmHg, the estimated right ventricular systolic pressure is [id number] mmHg. Left Atrium: Left atrial size was normal in size. Right Atrium: Right atrial size was normal in size. Pericardium: There is no evidence of pericardial effusion. Mitral Valve: The mitral valve is normal in structure. No evidence of mitral valve regurgitation. No evidence of mitral valve stenosis. Tricuspid Valve: The tricuspid valve is normal in structure. Tricuspid valve regurgitation is not demonstrated. No evidence of tricuspid stenosis. Aortic Valve: The aortic valve is normal in structure. Aortic valve regurgitation is not visualized. No  aortic stenosis is present. Pulmonic Valve: The pulmonic valve was normal in structure. Pulmonic valve regurgitation is not visualized. No evidence of pulmonic stenosis. Aorta: The aortic root is normal in size and structure. Venous: The inferior vena cava is normal in size with greater than 50% respiratory variability, suggesting right atrial pressure of 3 mmHg. IAS/Shunts: No atrial level shunt detected by color flow Doppler.  LEFT VENTRICLE PLAX 2D LVIDd:         3.70 cm     Diastology LVIDs:         2.50 cm     LV e' medial:    12.90 cm/s LV PW:         0.70 cm     LV E/e' medial:  5.0 LV IVS:        0.70 cm     LV e' lateral:   14.90 cm/s LVOT diam:     1.90 cm     LV E/e' lateral: 4.4 LV SV:  60 LV SV Index:   35          2D Longitudinal Strain LVOT Area:     2.84 cm    2D Strain GLS Avg:     -19.8 %  LV Volumes (MOD) LV vol d, MOD A2C: 45.9 ml LV vol d, MOD A4C: 52.1 ml LV vol s, MOD A2C: 18.0 ml LV vol s, MOD A4C: 20.3 ml LV SV MOD A2C:     27.9 ml LV SV MOD A4C:     52.1 ml LV SV MOD BP:      29.7 ml RIGHT VENTRICLE RV S prime:     11.70 cm/s TAPSE (M-mode): 1.5 cm LEFT ATRIUM             Index       RIGHT ATRIUM          Index LA diam:        2.50 cm 1.46 cm/m  RA Area:     6.72 cm LA Vol (A2C):   17.3 ml 10.10 ml/m RA Volume:   11.00 ml 6.42 ml/m LA Vol (A4C):   11.6 ml 6.77 ml/m LA Biplane Vol: 14.5 ml 8.47 ml/m  AORTIC VALVE LVOT Vmax:   122.00 cm/s LVOT Vmean:  81.000 cm/s LVOT VTI:    0.211 m  AORTA Ao Root diam: 2.50 cm Ao Asc diam:  3.00 cm MITRAL VALVE               TRICUSPID VALVE MV Area (PHT): 3.42 cm    TR Peak grad:   15.1 mmHg MV Decel Time: 222 msec    TR Vmax:        194.00 cm/s MV E velocity: 65.10 cm/s MV A velocity: 56.10 cm/s  SHUNTS MV E/A ratio:  1.16        Systemic VTI:  0.21 m                            Systemic Diam: 1.90 cm Jenkins Rouge MD Electronically signed by Jenkins Rouge MD Signature Date/Time: 08/30/2020/10:03:12 AM    Final    (Echo, Carotid, EGD,  Colonoscopy, ERCP)    Subjective: Patient seen and examined.  Friend at the bedside.  No overnight events.  Denies any nausea vomiting or abdominal pain.  She had first formed stool after a week last night.  Eager to go home.   Discharge Exam: Vitals:   09/19/20 0200 09/19/20 0430  BP:  109/83  Pulse:  99  Resp: 19 20  Temp:  98.2 F (36.8 C)  SpO2:  96%   Vitals:   09/18/20 2135 09/18/20 2304 09/19/20 0200 09/19/20 0430  BP:  120/82  109/83  Pulse:  (!) 111  99  Resp:   19 20  Temp:  98.1 F (36.7 C)  98.2 F (36.8 C)  TempSrc:  Oral  Oral  SpO2:  96%  96%  Weight: 61 kg     Height: '5\' 5"'$  (1.651 m)       General: Pt is alert, awake, not in acute distress Cardiovascular: RRR, S1/S2 +, no rubs, no gallops Respiratory: CTA bilaterally, no wheezing, no rhonchi Patient has a Port-A-Cath on the right chest.  Nontender. Abdominal: Soft, NT, ND, bowel sounds + Extremities: no edema, no cyanosis    The results of significant diagnostics from this hospitalization (including imaging, microbiology, ancillary and laboratory) are listed below for reference.  Microbiology: Recent Results (from the past 240 hour(s))  Resp Panel by RT-PCR (Flu A&B, Covid) Nasopharyngeal Swab     Status: None   Collection Time: 09/09/20  5:25 PM   Specimen: Nasopharyngeal Swab; Nasopharyngeal(NP) swabs in vial transport medium  Result Value Ref Range Status   SARS Coronavirus 2 by RT PCR NEGATIVE NEGATIVE Final    Comment: (NOTE) SARS-CoV-2 target nucleic acids are NOT DETECTED.  The SARS-CoV-2 RNA is generally detectable in upper respiratory specimens during the acute phase of infection. The lowest concentration of SARS-CoV-2 viral copies this assay can detect is 138 copies/mL. A negative result does not preclude SARS-Cov-2 infection and should not be used as the sole basis for treatment or other patient management decisions. A negative result may occur with  improper specimen  collection/handling, submission of specimen other than nasopharyngeal swab, presence of viral mutation(s) within the areas targeted by this assay, and inadequate number of viral copies(<138 copies/mL). A negative result must be combined with clinical observations, patient history, and epidemiological information. The expected result is Negative.  Fact Sheet for Patients:  EntrepreneurPulse.com.au  Fact Sheet for Healthcare Providers:  IncredibleEmployment.be  This test is no t yet approved or cleared by the [place] FDA and  has been authorized for detection and/or diagnosis of SARS-CoV-2 by FDA under an Emergency Use Authorization (EUA). This EUA will remain  in effect (meaning this test can be used) for the duration of the COVID-19 declaration under Section 564(b)(1) of the Act, 21 U.S.C.section 360bbb-3(b)(1), unless the authorization is terminated  or revoked sooner.       Influenza A by PCR NEGATIVE NEGATIVE Final   Influenza B by PCR NEGATIVE NEGATIVE Final    Comment: (NOTE) The Xpert Xpress SARS-CoV-2/FLU/RSV plus assay is intended as an aid in the diagnosis of influenza from Nasopharyngeal swab specimens and should not be used as a sole basis for treatment. Nasal washings and aspirates are unacceptable for Xpert Xpress SARS-CoV-2/FLU/RSV testing.  Fact Sheet for Patients: EntrepreneurPulse.com.au  Fact Sheet for Healthcare Providers: IncredibleEmployment.be  This test is not yet approved or cleared by the [place] FDA and has been authorized for detection and/or diagnosis of SARS-CoV-2 by FDA under an Emergency Use Authorization (EUA). This EUA will remain in effect (meaning this test can be used) for the duration of the COVID-19 declaration under Section 564(b)(1) of the Act, 21 U.S.C. section 360bbb-3(b)(1), unless the authorization is terminated or revoked.  Performed at [facility], [place] [address], [place], [place] [address]   C Difficile Quick Screen w PCR reflex     Status: None   Collection Time: 09/10/20 12:43 PM   Specimen: STOOL  Result Value Ref Range Status   C Diff antigen NEGATIVE NEGATIVE Final   C Diff toxin NEGATIVE NEGATIVE Final   C Diff interpretation No C. difficile detected.  Final    Comment: Performed at [facility], [place] [address] [place], [place] [address]     Labs: BNP (last 3 results) No results for input(s): BNP in the last 8760 hours. Basic Metabolic Panel: Recent Labs  Lab 09/13/20 0418 09/14/20 0345 09/15/20 0324 09/16/20 0318 09/17/20 0420 09/18/20 0125 09/19/20 0337  NA 129*   < > 130* 129* 133* 131* 132*  K 3.0*   < > 3.3* 3.5 3.2* 3.5 3.7  CL 98   < > 98 99 100 101 103  CO2 22   < > '24 24 26 23 23  '$ GLUCOSE  98   < > 93 103* 104* 113* 101*  BUN 7   < > <5* <5* <5* <5* <5*  CREATININE 0.45   < > <0.30* 0.42* 0.38* 0.37* 0.30*  CALCIUM 7.4*   < > 7.6* 7.6* 7.7* 7.9* 8.2*  MG 2.1  --   --   --   --   --  2.2   < > = values in this interval not displayed.   Liver Function Tests: Recent Labs  Lab 09/15/20 0324 09/16/20 0318 09/17/20 0420 09/18/20 0125 09/19/20 0337  AST 51* 38 33 25 26  ALT 54* 52* 52* 48* 43  ALKPHOS 171* 171* 171* 182* 248*  BILITOT 3.8* 3.4* 2.5* 2.1* 1.7*  PROT 4.7* 5.0* 5.2* 5.8* 6.0*  ALBUMIN 2.0* 2.1* 2.0* 2.2* 2.3*   No results for input(s): LIPASE, AMYLASE in the last 168 hours. No results for input(s): AMMONIA in the last 168 hours. CBC: Recent Labs  Lab 09/15/20 0324 09/16/20 0318 09/17/20 0420 09/18/20 0125 09/19/20 0337  WBC 9.0 8.8 7.0 7.3 5.7  HGB 9.3* 9.3* 9.2* 10.2* 10.4*  HCT 27.5* 28.1* 27.9* 31.1* 32.6*  MCV 88.4 90.9 91.2 92.8 92.9  PLT 134* 167 175 232 267   Cardiac Enzymes: No results for input(s): CKTOTAL, CKMB, CKMBINDEX, TROPONINI in the last 168 hours. BNP: Invalid input(s): POCBNP CBG: No results for  input(s): GLUCAP in the last 168 hours. D-Dimer No results for input(s): DDIMER in the last 72 hours. Hgb A1c No results for input(s): HGBA1C in the last 72 hours. Lipid Profile No results for input(s): CHOL, HDL, LDLCALC, TRIG, CHOLHDL, LDLDIRECT in the last 72 hours. Thyroid function studies No results for input(s): TSH, T4TOTAL, T3FREE, THYROIDAB in the last 72 hours.  Invalid input(s): FREET3 Anemia work up No results for input(s): VITAMINB12, FOLATE, FERRITIN, TIBC, IRON, RETICCTPCT in the last 72 hours. Urinalysis    Component Value Date/Time   COLORURINE AMBER (A) 09/09/2020 2051   APPEARANCEUR HAZY (A) 09/09/2020 2051   LABSPEC 1.016 09/09/2020 2051   PHURINE 6.0 09/09/2020 2051   GLUCOSEU NEGATIVE 09/09/2020 2051   HGBUR MODERATE (A) 09/09/2020 2051   BILIRUBINUR NEGATIVE 09/09/2020 2051   BILIRUBINUR neg 10/31/2011 0945   KETONESUR 20 (A) 09/09/2020 2051   PROTEINUR 30 (A) 09/09/2020 2051   UROBILINOGEN 0.2 01/08/2014 1757   NITRITE NEGATIVE 09/09/2020 2051   LEUKOCYTESUR NEGATIVE 09/09/2020 2051   Sepsis Labs Invalid input(s): PROCALCITONIN,  WBC,  LACTICIDVEN Microbiology Recent Results (from the past 240 hour(s))  Resp Panel by RT-PCR (Flu A&B, Covid) Nasopharyngeal Swab     Status: None   Collection Time: 09/09/20  5:25 PM   Specimen: Nasopharyngeal Swab; Nasopharyngeal(NP) swabs in vial transport medium  Result Value Ref Range Status   SARS Coronavirus 2 by RT PCR NEGATIVE NEGATIVE Final    Comment: (NOTE) SARS-CoV-2 target nucleic acids are NOT DETECTED.  The SARS-CoV-2 RNA is generally detectable in upper respiratory specimens during the acute phase of infection. The lowest concentration of SARS-CoV-2 viral copies this assay can detect is 138 copies/mL. A negative result does not preclude SARS-Cov-2 infection and should not be used as the sole basis for treatment or other patient management decisions. A negative result may occur with  improper specimen  collection/handling, submission of specimen other than nasopharyngeal swab, presence of viral mutation(s) within the areas targeted by this assay, and inadequate number of viral copies(<138 copies/mL). A negative result must be combined with clinical observations, patient history, and epidemiological information. The  expected result is Negative.  Fact Sheet for Patients:  EntrepreneurPulse.com.au  Fact Sheet for Healthcare Providers:  IncredibleEmployment.be  This test is no t yet approved or cleared by the [place] FDA and  has been authorized for detection and/or diagnosis of SARS-CoV-2 by FDA under an Emergency Use Authorization (EUA). This EUA will remain  in effect (meaning this test can be used) for the duration of the COVID-19 declaration under Section 564(b)(1) of the Act, 21 U.S.C.section 360bbb-3(b)(1), unless the authorization is terminated  or revoked sooner.       Influenza A by PCR NEGATIVE NEGATIVE Final   Influenza B by PCR NEGATIVE NEGATIVE Final    Comment: (NOTE) The Xpert Xpress SARS-CoV-2/FLU/RSV plus assay is intended as an aid in the diagnosis of influenza from Nasopharyngeal swab specimens and should not be used as a sole basis for treatment. Nasal washings and aspirates are unacceptable for Xpert Xpress SARS-CoV-2/FLU/RSV testing.  Fact Sheet for Patients: EntrepreneurPulse.com.au  Fact Sheet for Healthcare Providers: IncredibleEmployment.be  This test is not yet approved or cleared by the [place] FDA and has been authorized for detection and/or diagnosis of SARS-CoV-2 by FDA under an Emergency Use Authorization (EUA). This EUA will remain in effect (meaning this test can be used) for the duration of the COVID-19 declaration under Section 564(b)(1) of the Act, 21 U.S.C. section 360bbb-3(b)(1), unless the authorization is terminated or revoked.  Performed at [facility], [place] [address], [place], [place] [address]   C Difficile Quick Screen w PCR reflex     Status: None   Collection Time: 09/10/20 12:43 PM   Specimen: STOOL  Result Value Ref Range Status   C Diff antigen NEGATIVE NEGATIVE Final   C Diff toxin NEGATIVE NEGATIVE Final   C Diff interpretation No C. difficile detected.  Final    Comment: Performed at [facility], [place] [address] [place], [place] [address]     Time coordinating discharge:  28 minutes  SIGNED:   Barb Merino, MD  [facility] 09/19/2020, 1:14 PM

## 2020-09-20 ENCOUNTER — Encounter: Payer: Self-pay | Admitting: *Deleted

## 2020-09-25 NOTE — Progress Notes (Signed)
Patient Care Team: Pcp, No as PCP - General [name], MD as Consulting Physician (Hematology and Oncology) [name], [name], NP as Nurse Practitioner (Hematology and Oncology) [name], MD as Consulting Physician (General Surgery) Mauro Kaufmann, RN as Oncology Nurse Navigator Rockwell Germany, RN as Oncology Nurse Navigator  DIAGNOSIS:    ICD-10-CM   1. 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  Prominent left axillary lymph node cluster of adjacent lymph nodes, irregular nodule right middle lobe 1.1 cm, aggressive lesion right second rib cortical destruction, multiple lucent lesions throughout the thoracic spine including T1 vertebral body and T2, irregular multilobar lesion central left hepatic lobe 4.3 x 4.7 cm.  Several satellite lesions in the left lateral hepatic lobe.  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 The report date is 09/06/2020.   The CancerNext-Expanded + RNAinsight gene panel offered by [employer] and includes sequencing and rearrangement analysis for the following 77 genes: AIP, ALK, APC, ATM, AXIN2, BAP1, BARD1, BLM, BMPR1A, BRCA1, BRCA2, BRIP1, CDC73, CDH1, CDK4, CDKN1B, CDKN2A, CHEK2, CTNNA1, DICER1,  FANCC, FH, FLCN, GALNT12, KIF1B, LZTR1, MAX, MEN1, MET, MLH1, MSH2, MSH3, MSH6, MUTYH, NBN, NF1, NF2, NTHL1, PALB2, PHOX2B, PMS2, POT1, PRKAR1A, PTCH1, PTEN, RAD51C, RAD51D, RB1, RECQL, RET, SDHA, SDHAF2, SDHB, SDHC, SDHD, SMAD4, SMARCA4, SMARCB1, SMARCE1, STK11, SUFU, TMEM127, TP53, TSC1, TSC2, VHL and XRCC2 (sequencing and deletion/duplication); EGFR, EGLN1, HOXB13, KIT, MITF, PDGFRA, POLD1 and POLE (sequencing only); EPCAM and GREM1 (deletion/duplication only). RNA data is routinely analyzed for use in variant interpretation for all genes.   Carcinoma of breast metastatic to bone ([place])  08/17/2020 Initial Diagnosis   Carcinoma of breast metastatic to bone ([place])    08/31/2020 -  Chemotherapy    Patient is on Treatment Plan: BREAST DOCETAXEL + TRASTUZUMAB + PERTUZUMAB (THP) Q21D X 8 CYCLES / TRASTUZUMAB + PERTUZUMAB Q21D X 4 CYCLES        Genetic Testing   Negative genetic testing:  No pathogenic variants detected on the [employer] CancerNext-Expanded + RNAinsight panel. The report date is 09/06/2020.   The CancerNext-Expanded + RNAinsight gene panel offered by [employer] and includes sequencing and rearrangement analysis for the following 77 genes: AIP, ALK, APC, ATM, AXIN2, BAP1, BARD1, BLM, BMPR1A, BRCA1, BRCA2, BRIP1, CDC73, CDH1, CDK4, CDKN1B, CDKN2A, CHEK2, CTNNA1, DICER1, FANCC, FH, FLCN, GALNT12, KIF1B, LZTR1, MAX, MEN1, MET, MLH1, MSH2, MSH3, MSH6, MUTYH, NBN, NF1, NF2, NTHL1, PALB2, PHOX2B, PMS2, POT1, PRKAR1A, PTCH1, PTEN, RAD51C, RAD51D, RB1, RECQL, RET, SDHA, SDHAF2, SDHB, SDHC, SDHD, SMAD4, SMARCA4, SMARCB1, SMARCE1, STK11, SUFU, TMEM127, TP53, TSC1, TSC2, VHL and XRCC2 (sequencing and deletion/duplication); EGFR, EGLN1, HOXB13, KIT, MITF, PDGFRA, POLD1 and POLE (sequencing only); EPCAM and GREM1 (  deletion/duplication only). RNA data is routinely analyzed for use in variant interpretation for all genes.     CHIEF COMPLIANT: Cycle 3 Taxotere Herceptin  INTERVAL HISTORY: CORDIA MIKLOS is a 25 y.o. with above-mentioned history of metastatic left breast cancer, currently on chemotherapy with Taxotere Herceptin Perjeta. She reports to the clinic today for treatment.  She spent 11 days in the hospital and is feeling better since discharge.  She takes Lomotil 3 times a day.  Because of that she feels full all the time.  She has a very difficult time drinking water.  Emotionally she is having a hard time.  She stopped her antidepressant to Zoloft because she thought it might be causing her nausea.  Currently she gets diarrhea every time she eats.  ALLERGIES:  is allergic to other.  MEDICATIONS:  Current Outpatient Medications  Medication Sig Dispense Refill   acyclovir (ZOVIRAX) 800 MG tablet Take 800 mg by mouth 2 (two) times daily as needed (flare up).     acyclovir ointment (ZOVIRAX) 5 % Apply 1 application topically 4 (four) times daily as needed. outbreak     amphetamine-dextroamphetamine (ADDERALL XR) 15 MG 24 hr capsule Take 15 mg by mouth daily. Takes 15 mg on work days     cholestyramine (QUESTRAN) 4 g packet Take 1 packet mixed with liquid by mouth 2 times daily as needed. 60 each 12   diphenoxylate-atropine (LOMOTIL) 2.5-0.025 MG tablet Take 1 tablet by mouth 3 (three) times daily for 10 days. 30 tablet 0   gabapentin (NEURONTIN) 100 MG capsule Take 100 mg by mouth 3 (three) times daily as needed (sleep/anxiety).     levonorgestrel (KYLEENA) 19.5 MG IUD by Intrauterine route.     lidocaine-prilocaine (EMLA) cream Apply to affected area once 30 g 3   LORazepam (ATIVAN) 0.5 MG tablet Take 1 tablet (0.5 mg total) by mouth at bedtime as needed for sleep. 30 tablet 0   Multiple Vitamin (MULTIVITAMINS PO) Take 1 tablet by mouth daily.     ondansetron (ZOFRAN) 8 MG tablet Take 1 tablet (8 mg total) by mouth 2 (two) times daily as needed for refractory nausea / vomiting. 30 tablet 1   oxyCODONE-acetaminophen (PERCOCET/ROXICET) 5-325 MG tablet Take 1 tablet by mouth every 8  (eight) hours as needed for severe pain. 60 tablet 0   prochlorperazine (COMPAZINE) 10 MG tablet Take 1 tablet (10 mg total) by mouth every 6 (six) hours as needed for nausea or vomiting. 30 tablet 1   No current facility-administered medications for this visit.    PHYSICAL EXAMINATION: ECOG PERFORMANCE STATUS: 1 - Symptomatic but completely ambulatory  There were no vitals filed for this visit. There were no vitals filed for this visit.  LABORATORY DATA:  I have reviewed the data as listed CMP Latest Ref Rng & Units 09/19/2020 09/18/2020 09/17/2020  Glucose 70 - 99 mg/dL 101(H) 113(H) 104(H)  BUN 6 - 20 mg/dL <5(L) <5(L) <5(L)  Creatinine 0.44 - 1.00 mg/dL 0.30(L) 0.37(L) 0.38(L)  Sodium 135 - 145 mmol/L 132(L) 131(L) 133(L)  Potassium 3.5 - 5.1 mmol/L 3.7 3.5 3.2(L)  Chloride 98 - 111 mmol/L 103 101 100  CO2 22 - 32 mmol/L _0 Calcium 8.9 - 10.3 mg/dL 8.2(L) 7.9(L) 7.7(L)  Total Protein 6.5 - 8.1 g/dL 6.0(L) 5.8(L) 5.2(L)  Total Bilirubin 0.3 - 1.2 mg/dL 1.7(H) 2.1(H) 2.5(H)  Alkaline Phos 38 - 126 U/L 248(H) 182(H) 171(H)  AST 15 - 41 U/L 26 25 33  ALT 0 - 44 U/L 43 48(H) 52(H)    Lab Results  Component Value Date   WBC 7.0 09/26/2020   HGB 10.0 (L) 09/26/2020   HCT 31.1 (L) 09/26/2020   MCV 93.1 09/26/2020   PLT 347 09/26/2020   NEUTROABS 5.2 09/26/2020    ASSESSMENT & PLAN:  Carcinoma of breast metastatic to bone [facility]) Metastatic breast cancer ([place]) 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, FISH positive ratio 2.8, Ki-67 25%, lymph node positive   Palliative radiation completed 09/03/2020   Treatment plan: Palliative chemotherapy with Taxotere Herceptin Perjeta every 3 weeks x6 cycles followed by Herceptin Perjeta  maintenance ------------------------------------------------------------------------------------------------------------------------------------ Current treatment: Cycle 2 Taxotere Herceptin (Perjeta discontinued) Toxicities: Hospitalization 7/24-09/19/20: Diarrhea We will hold Perjeta for this cycle.  Reduce the dosage of Taxotere. I suspect that the positive diarrhea was related to a prior radiation and immediately giving her chemo and Perjeta. If she does not have any worsening diarrhea with this cycle he might consider adding Perjeta again at a later time.  Depression: I sent a prescription for Effexor 37.5 mg. Return to clinic in 1 week for toxicity evaluation with Mendel Ryder. I will see her back in 3 weeks for cycle 3. No orders of the defined types were placed in this encounter.  The patient has a good understanding of the overall plan. she agrees with it. she will call with any problems that may develop before the next visit here.  Total time spent: 30 mins including face to face time and time spent for planning, charting and coordination of care  Rulon Eisenmenger, MD, MPH 09/26/2020  I, Thana Ates, am acting as scribe for Dr. [name].  I have reviewed the above documentation for accuracy and completeness, and I agree with the above.

## 2020-09-25 NOTE — Assessment & Plan Note (Signed)
Metastatic breast cancer ([place]) 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, ER 30%, PR 20%, HER2 equivocal by IHC, FISH positiveratio 2.8, Ki-67 25%, lymph node positive  Palliative radiation completed 09/03/2020  Treatment plan: Palliative chemotherapy with Taxotere Herceptin Perjeta every 3 weeks x6 cycles followed by Herceptin Perjeta maintenance ------------------------------------------------------------------------------------------------------------------------------------ Current treatment: Cycle 2 Taxotere Herceptin (Perjeta discontinued) Toxicities: Hospitalization 7/24-09/19/20: Diarrhea

## 2020-09-26 ENCOUNTER — Inpatient Hospital Stay: Payer: Medicaid Other | Attending: Hematology and Oncology

## 2020-09-26 ENCOUNTER — Encounter: Payer: Self-pay | Admitting: Hematology and Oncology

## 2020-09-26 ENCOUNTER — Other Ambulatory Visit: Payer: Self-pay

## 2020-09-26 ENCOUNTER — Inpatient Hospital Stay (HOSPITAL_BASED_OUTPATIENT_CLINIC_OR_DEPARTMENT_OTHER): Payer: Medicaid Other | Admitting: Hematology and Oncology

## 2020-09-26 ENCOUNTER — Other Ambulatory Visit (HOSPITAL_COMMUNITY): Payer: Self-pay

## 2020-09-26 ENCOUNTER — Inpatient Hospital Stay: Payer: Medicaid Other

## 2020-09-26 ENCOUNTER — Encounter: Payer: Self-pay | Admitting: *Deleted

## 2020-09-26 VITALS — HR 106

## 2020-09-26 DIAGNOSIS — Z79899 Other long term (current) drug therapy: Secondary | ICD-10-CM | POA: Insufficient documentation

## 2020-09-26 DIAGNOSIS — C7800 Secondary malignant neoplasm of unspecified lung: Secondary | ICD-10-CM | POA: Diagnosis not present

## 2020-09-26 DIAGNOSIS — R000 Tachycardia, unspecified: Secondary | ICD-10-CM | POA: Insufficient documentation

## 2020-09-26 DIAGNOSIS — C50912 Malignant neoplasm of unspecified site of left female breast: Secondary | ICD-10-CM

## 2020-09-26 DIAGNOSIS — F32A Depression, unspecified: Secondary | ICD-10-CM | POA: Insufficient documentation

## 2020-09-26 DIAGNOSIS — C787 Secondary malignant neoplasm of liver and intrahepatic bile duct: Secondary | ICD-10-CM | POA: Insufficient documentation

## 2020-09-26 DIAGNOSIS — Z923 Personal history of irradiation: Secondary | ICD-10-CM | POA: Diagnosis not present

## 2020-09-26 DIAGNOSIS — C7951 Secondary malignant neoplasm of bone: Secondary | ICD-10-CM | POA: Diagnosis not present

## 2020-09-26 DIAGNOSIS — Z5111 Encounter for antineoplastic chemotherapy: Secondary | ICD-10-CM | POA: Insufficient documentation

## 2020-09-26 DIAGNOSIS — C50919 Malignant neoplasm of unspecified site of unspecified female breast: Secondary | ICD-10-CM

## 2020-09-26 DIAGNOSIS — Z95828 Presence of other vascular implants and grafts: Secondary | ICD-10-CM

## 2020-09-26 DIAGNOSIS — J4520 Mild intermittent asthma, uncomplicated: Secondary | ICD-10-CM | POA: Insufficient documentation

## 2020-09-26 DIAGNOSIS — R197 Diarrhea, unspecified: Secondary | ICD-10-CM | POA: Diagnosis not present

## 2020-09-26 DIAGNOSIS — Z5112 Encounter for antineoplastic immunotherapy: Secondary | ICD-10-CM | POA: Insufficient documentation

## 2020-09-26 LAB — CMP (CANCER CENTER ONLY)
ALT: 44 U/L (ref 0–44)
AST: 26 U/L (ref 15–41)
Albumin: 2.5 g/dL — ABNORMAL LOW (ref 3.5–5.0)
Alkaline Phosphatase: 209 U/L — ABNORMAL HIGH (ref 38–126)
Anion gap: 8 (ref 5–15)
BUN: 7 mg/dL (ref 6–20)
CO2: 26 mmol/L (ref 22–32)
Calcium: 8.6 mg/dL — ABNORMAL LOW (ref 8.9–10.3)
Chloride: 108 mmol/L (ref 98–111)
Creatinine: 0.55 mg/dL (ref 0.44–1.00)
GFR, Estimated: >60 mL/min (ref >60)
Glucose, Bld: 101 mg/dL — ABNORMAL HIGH (ref 70–99)
Potassium: 3.5 mmol/L (ref 3.5–5.1)
Sodium: 142 mmol/L (ref 135–145)
Total Bilirubin: 0.7 mg/dL (ref 0.3–1.2)
Total Protein: 5.6 g/dL — ABNORMAL LOW (ref 6.5–8.1)

## 2020-09-26 LAB — CBC WITH DIFFERENTIAL (CANCER CENTER ONLY)
Abs Immature Granulocytes: 0.14 10*3/uL — ABNORMAL HIGH (ref 0.00–0.07)
Basophils Absolute: 0 10*3/uL (ref 0.0–0.1)
Basophils Relative: 0 %
Eosinophils Absolute: 0 10*3/uL (ref 0.0–0.5)
Eosinophils Relative: 0 %
HCT: 31.1 % — ABNORMAL LOW (ref 36.0–46.0)
Hemoglobin: 10 g/dL — ABNORMAL LOW (ref 12.0–15.0)
Immature Granulocytes: 2 %
Lymphocytes Relative: 16 %
Lymphs Abs: 1.1 10*3/uL (ref 0.7–4.0)
MCH: 29.9 pg (ref 26.0–34.0)
MCHC: 32.2 g/dL (ref 30.0–36.0)
MCV: 93.1 fL (ref 80.0–100.0)
Monocytes Absolute: 0.5 10*3/uL (ref 0.1–1.0)
Monocytes Relative: 8 %
Neutro Abs: 5.2 10*3/uL (ref 1.7–7.7)
Neutrophils Relative %: 74 %
Platelet Count: 347 10*3/uL (ref 150–400)
RBC: 3.34 MIL/uL — ABNORMAL LOW (ref 3.87–5.11)
RDW: 14.3 % (ref 11.5–15.5)
WBC Count: 7 10*3/uL (ref 4.0–10.5)
nRBC: 0 % (ref 0.0–0.2)

## 2020-09-26 MED ORDER — SODIUM CHLORIDE 0.9% FLUSH
10.0000 mL | INTRAVENOUS | Status: AC | PRN
  Administered 2020-09-26: 10 mL
  Filled 2020-09-26: qty 10

## 2020-09-26 MED ORDER — DIPHENHYDRAMINE HCL 25 MG PO CAPS
25.0000 mg | ORAL_CAPSULE | Freq: Once | ORAL | Status: AC
  Administered 2020-09-26: 25 mg via ORAL

## 2020-09-26 MED ORDER — TRASTUZUMAB-DKST CHEMO 150 MG IV SOLR
6.0000 mg/kg | Freq: Once | INTRAVENOUS | Status: AC
  Administered 2020-09-26: 378 mg via INTRAVENOUS
  Filled 2020-09-26: qty 18

## 2020-09-26 MED ORDER — SODIUM CHLORIDE 0.9 % IV SOLN
10.0000 mg | Freq: Once | INTRAVENOUS | Status: AC
  Administered 2020-09-26: 10 mg via INTRAVENOUS
  Filled 2020-09-26: qty 10

## 2020-09-26 MED ORDER — GOSERELIN ACETATE 3.6 MG ~~LOC~~ IMPL
3.6000 mg | DRUG_IMPLANT | Freq: Once | SUBCUTANEOUS | Status: AC
  Administered 2020-09-26: 3.6 mg via SUBCUTANEOUS

## 2020-09-26 MED ORDER — ACETAMINOPHEN 325 MG PO TABS
ORAL_TABLET | ORAL | Status: AC
  Filled 2020-09-26: qty 2

## 2020-09-26 MED ORDER — VENLAFAXINE HCL ER 37.5 MG PO CP24
37.5000 mg | ORAL_CAPSULE | Freq: Every day | ORAL | 3 refills | Status: DC
  Filled 2020-09-26: qty 30, 30d supply, fill #0

## 2020-09-26 MED ORDER — ACETAMINOPHEN 325 MG PO TABS
650.0000 mg | ORAL_TABLET | Freq: Once | ORAL | Status: AC
  Administered 2020-09-26: 650 mg via ORAL

## 2020-09-26 MED ORDER — GOSERELIN ACETATE 3.6 MG ~~LOC~~ IMPL
DRUG_IMPLANT | SUBCUTANEOUS | Status: AC
  Filled 2020-09-26: qty 3.6

## 2020-09-26 MED ORDER — SODIUM CHLORIDE 0.9 % IV SOLN
65.0000 mg/m2 | Freq: Once | INTRAVENOUS | Status: AC
  Administered 2020-09-26: 110 mg via INTRAVENOUS
  Filled 2020-09-26: qty 11

## 2020-09-26 MED ORDER — SODIUM CHLORIDE 0.9% FLUSH
10.0000 mL | INTRAVENOUS | Status: DC | PRN
  Administered 2020-09-26: 10 mL
  Filled 2020-09-26: qty 10

## 2020-09-26 MED ORDER — SODIUM CHLORIDE 0.9 % IV SOLN
Freq: Once | INTRAVENOUS | Status: AC
  Filled 2020-09-26: qty 250

## 2020-09-26 MED ORDER — HEPARIN SOD (PORK) LOCK FLUSH 100 UNIT/ML IV SOLN
500.0000 [IU] | Freq: Once | INTRAVENOUS | Status: AC | PRN
  Administered 2020-09-26: 500 [IU]
  Filled 2020-09-26: qty 5

## 2020-09-26 MED ORDER — DIPHENHYDRAMINE HCL 25 MG PO CAPS
ORAL_CAPSULE | ORAL | Status: AC
  Filled 2020-09-26: qty 1

## 2020-09-26 NOTE — Patient Instructions (Signed)
[facility]  Discharge Instructions: Thank you for choosing [facility] to provide your oncology and hematology care.   If you have a lab appointment with the [facility], please go directly to the [facility] and check in at the registration area.   Wear comfortable clothing and clothing appropriate for easy access to any Portacath or PICC line.   We strive to give you quality time with your provider. You may need to reschedule your appointment if you arrive late (15 or more minutes).  Arriving late affects you and other patients whose appointments are after yours.  Also, if you miss three or more appointments without notifying the office, you may be dismissed from the clinic at the provider's discretion.      For prescription refill requests, have your pharmacy contact our office and allow 72 hours for refills to be completed.    Today you received the following chemotherapy and/or immunotherapy agents herceptin/taxotere      To help prevent nausea and vomiting after your treatment, we encourage you to take your nausea medication as directed.  BELOW ARE SYMPTOMS THAT SHOULD BE REPORTED IMMEDIATELY: *FEVER GREATER THAN 100.4 F (38 C) OR HIGHER *CHILLS OR SWEATING *NAUSEA AND VOMITING THAT IS NOT CONTROLLED WITH YOUR NAUSEA MEDICATION *UNUSUAL SHORTNESS OF BREATH *UNUSUAL BRUISING OR BLEEDING *URINARY PROBLEMS (pain or burning when urinating, or frequent urination) *BOWEL PROBLEMS (unusual diarrhea, constipation, pain near the anus) TENDERNESS IN MOUTH AND THROAT WITH OR WITHOUT PRESENCE OF ULCERS (sore throat, sores in mouth, or a toothache) UNUSUAL RASH, SWELLING OR PAIN  UNUSUAL VAGINAL DISCHARGE OR ITCHING   Items with * indicate a potential emergency and should be followed up as soon as possible or go to the Emergency Department if any problems should occur.  Please show the CHEMOTHERAPY ALERT CARD or IMMUNOTHERAPY ALERT CARD at  check-in to the Emergency Department and triage nurse.  Should you have questions after your visit or need to cancel or reschedule your appointment, please contact [facility]  Dept: [phone]  and follow the prompts.  Office hours are 8:00 a.m. to 4:30 p.m. Monday - Friday. Please note that voicemails left after 4:00 p.m. may not be returned until the following business day.  We are closed weekends and major holidays. You have access to a nurse at all times for urgent questions. Please call the main number to the clinic Dept: [phone] and follow the prompts.   For any non-urgent questions, you may also contact your provider using MyChart. We now offer e-Visits for anyone 58 and older to request care online for non-urgent symptoms. For details visit [email or web address].   Also download the MyChart app! Go to the app store, search "MyChart", open the app, select [place] [facility] [place], and log in with your MyChart username and password.  Due to Covid, a mask is required upon entering the hospital/clinic. If you do not have a mask, one will be given to you upon arrival. For doctor visits, patients may have 1 support person aged 92 or older with them. For treatment visits, patients cannot have anyone with them due to current Covid guidelines and our immunocompromised population.

## 2020-09-26 NOTE — Progress Notes (Signed)
Ok to treat with elevated HR per Dr.Gudena.

## 2020-09-27 ENCOUNTER — Encounter: Payer: Self-pay | Admitting: Hematology and Oncology

## 2020-09-28 ENCOUNTER — Inpatient Hospital Stay: Payer: Medicaid Other

## 2020-09-28 ENCOUNTER — Telehealth: Payer: Self-pay | Admitting: Hematology and Oncology

## 2020-09-28 NOTE — Telephone Encounter (Signed)
Scheduled per 8/10 los. Called and spoke with pt confirmed 8/17 appts

## 2020-10-03 ENCOUNTER — Inpatient Hospital Stay (HOSPITAL_BASED_OUTPATIENT_CLINIC_OR_DEPARTMENT_OTHER): Payer: Medicaid Other | Admitting: Adult Health

## 2020-10-03 ENCOUNTER — Other Ambulatory Visit: Payer: Self-pay

## 2020-10-03 ENCOUNTER — Other Ambulatory Visit (HOSPITAL_COMMUNITY): Payer: Self-pay

## 2020-10-03 ENCOUNTER — Inpatient Hospital Stay: Payer: Medicaid Other

## 2020-10-03 ENCOUNTER — Encounter: Payer: Self-pay | Admitting: Adult Health

## 2020-10-03 ENCOUNTER — Encounter: Payer: Self-pay | Admitting: Hematology and Oncology

## 2020-10-03 VITALS — BP 135/83 | HR 130 | Temp 97.7°F | Resp 18 | Ht 65.0 in | Wt 122.0 lb

## 2020-10-03 DIAGNOSIS — C50912 Malignant neoplasm of unspecified site of left female breast: Secondary | ICD-10-CM | POA: Diagnosis not present

## 2020-10-03 DIAGNOSIS — C7951 Secondary malignant neoplasm of bone: Secondary | ICD-10-CM | POA: Diagnosis not present

## 2020-10-03 DIAGNOSIS — Z5111 Encounter for antineoplastic chemotherapy: Secondary | ICD-10-CM | POA: Diagnosis not present

## 2020-10-03 DIAGNOSIS — C50919 Malignant neoplasm of unspecified site of unspecified female breast: Secondary | ICD-10-CM

## 2020-10-03 LAB — CBC WITH DIFFERENTIAL (CANCER CENTER ONLY)
Abs Immature Granulocytes: 0.01 10*3/uL (ref 0.00–0.07)
Basophils Absolute: 0 10*3/uL (ref 0.0–0.1)
Basophils Relative: 1 %
Eosinophils Absolute: 0 10*3/uL (ref 0.0–0.5)
Eosinophils Relative: 2 %
HCT: 31.9 % — ABNORMAL LOW (ref 36.0–46.0)
Hemoglobin: 10.7 g/dL — ABNORMAL LOW (ref 12.0–15.0)
Immature Granulocytes: 1 %
Lymphocytes Relative: 41 %
Lymphs Abs: 0.7 10*3/uL (ref 0.7–4.0)
MCH: 30.2 pg (ref 26.0–34.0)
MCHC: 33.5 g/dL (ref 30.0–36.0)
MCV: 90.1 fL (ref 80.0–100.0)
Monocytes Absolute: 0.2 10*3/uL (ref 0.1–1.0)
Monocytes Relative: 10 %
Neutro Abs: 0.7 10*3/uL — ABNORMAL LOW (ref 1.7–7.7)
Neutrophils Relative %: 45 %
Platelet Count: 356 10*3/uL (ref 150–400)
RBC: 3.54 MIL/uL — ABNORMAL LOW (ref 3.87–5.11)
RDW: 14.5 % (ref 11.5–15.5)
WBC Count: 1.7 10*3/uL — ABNORMAL LOW (ref 4.0–10.5)
nRBC: 0 % (ref 0.0–0.2)

## 2020-10-03 LAB — CMP (CANCER CENTER ONLY)
ALT: 30 U/L (ref 0–44)
AST: 19 U/L (ref 15–41)
Albumin: 3.1 g/dL — ABNORMAL LOW (ref 3.5–5.0)
Alkaline Phosphatase: 146 U/L — ABNORMAL HIGH (ref 38–126)
Anion gap: 9 (ref 5–15)
BUN: <4 mg/dL — ABNORMAL LOW (ref 6–20)
CO2: 27 mmol/L (ref 22–32)
Calcium: 9.2 mg/dL (ref 8.9–10.3)
Chloride: 105 mmol/L (ref 98–111)
Creatinine: 0.66 mg/dL (ref 0.44–1.00)
GFR, Estimated: >60 mL/min (ref >60)
Glucose, Bld: 101 mg/dL — ABNORMAL HIGH (ref 70–99)
Potassium: 3.5 mmol/L (ref 3.5–5.1)
Sodium: 141 mmol/L (ref 135–145)
Total Bilirubin: 0.9 mg/dL (ref 0.3–1.2)
Total Protein: 6.4 g/dL — ABNORMAL LOW (ref 6.5–8.1)

## 2020-10-03 MED ORDER — ACYCLOVIR 800 MG PO TABS
800.0000 mg | ORAL_TABLET | Freq: Two times a day (BID) | ORAL | 5 refills | Status: DC | PRN
  Filled 2020-10-03: qty 60, 30d supply, fill #0

## 2020-10-03 MED ORDER — METRONIDAZOLE 1 % EX GEL
Freq: Every day | CUTANEOUS | 0 refills | Status: DC
  Filled 2020-10-03 – 2020-10-15 (×3): qty 60, 30d supply, fill #0

## 2020-10-03 NOTE — Progress Notes (Signed)
Ellen [name] Cancer Follow up:    Pcp, No No address on file   DIAGNOSIS: Cancer Staging Carcinoma of breast metastatic to bone [facility]) Staging form: Breast, AJCC 8th Edition - Clinical stage from 09/03/2020: Stage IV (cT4b, cN1, cM1, G2, ER+, PR+, HER2+) - Signed by Ellen Phlegm, NP on 10/03/2020 Stage prefix: Initial diagnosis Method of lymph node assessment: Clinical Histologic grading system: 3 grade system  SUMMARY OF ONCOLOGIC HISTORY: Oncology History  Metastatic breast cancer ([place])  08/15/2020 Imaging   Large mass in the medial left breast measuring 3.5 cm.  Prominent left axillary lymph node cluster of adjacent lymph nodes, irregular nodule right middle lobe 1.1 cm, aggressive lesion right second rib cortical destruction, multiple lucent lesions throughout the thoracic spine including T1 vertebral body and T2, irregular multilobar lesion central left hepatic lobe 4.3 x 4.7 cm.  Several satellite lesions in the left lateral hepatic lobe.  Multiple sclerotic lesions in the bones iliac wings, acetabular, medial iliac bones, sacrum multiple lesions lumbar spine L3-L4 and L5   08/17/2020 Initial Diagnosis   Biopsy revealed IDC with DCIS grade 2, ER 30%, PR 20%, HER2 equivocal by IHC, FISH positive, Ki-67 25%, lymph node positive    Genetic Testing   Negative genetic testing:  No pathogenic variants detected on the [employer] CancerNext-Expanded + RNAinsight panel. The report date is 09/06/2020.   The CancerNext-Expanded + RNAinsight gene panel offered by [employer] and includes sequencing and rearrangement analysis for the following 77 genes: AIP, ALK, APC, ATM, AXIN2, BAP1, BARD1, BLM, BMPR1A, BRCA1, BRCA2, BRIP1, CDC73, CDH1, CDK4, CDKN1B, CDKN2A, CHEK2, CTNNA1, DICER1, FANCC, FH, FLCN, GALNT12, KIF1B, LZTR1, MAX, MEN1, MET, MLH1, MSH2, MSH3, MSH6, MUTYH, NBN, NF1, NF2, NTHL1, PALB2, PHOX2B, PMS2, POT1, PRKAR1A, PTCH1, PTEN, RAD51C, RAD51D, RB1, RECQL, RET, SDHA,  SDHAF2, SDHB, SDHC, SDHD, SMAD4, SMARCA4, SMARCB1, SMARCE1, STK11, SUFU, TMEM127, TP53, TSC1, TSC2, VHL and XRCC2 (sequencing and deletion/duplication); EGFR, EGLN1, HOXB13, KIT, MITF, PDGFRA, POLD1 and POLE (sequencing only); EPCAM and GREM1 (deletion/duplication only). RNA data is routinely analyzed for use in variant interpretation for all genes.   Carcinoma of breast metastatic to bone ([place])  08/17/2020 Initial Diagnosis   Carcinoma of breast metastatic to bone [facility]); Goserelin/Zoladex started and to be given every 4 weeks    Genetic Testing   Negative genetic testing:  No pathogenic variants detected on the [employer] CancerNext-Expanded + RNAinsight panel. The report date is 09/06/2020.   The CancerNext-Expanded + RNAinsight gene panel offered by [employer] and includes sequencing and rearrangement analysis for the following 77 genes: AIP, ALK, APC, ATM, AXIN2, BAP1, BARD1, BLM, BMPR1A, BRCA1, BRCA2, BRIP1, CDC73, CDH1, CDK4, CDKN1B, CDKN2A, CHEK2, CTNNA1, DICER1, FANCC, FH, FLCN, GALNT12, KIF1B, LZTR1, MAX, MEN1, MET, MLH1, MSH2, MSH3, MSH6, MUTYH, NBN, NF1, NF2, NTHL1, PALB2, PHOX2B, PMS2, POT1, PRKAR1A, PTCH1, PTEN, RAD51C, RAD51D, RB1, RECQL, RET, SDHA, SDHAF2, SDHB, SDHC, SDHD, SMAD4, SMARCA4, SMARCB1, SMARCE1, STK11, SUFU, TMEM127, TP53, TSC1, TSC2, VHL and XRCC2 (sequencing and deletion/duplication); EGFR, EGLN1, HOXB13, KIT, MITF, PDGFRA, POLD1 and POLE (sequencing only); EPCAM and GREM1 (deletion/duplication only). RNA data is routinely analyzed for use in variant interpretation for all genes.   08/18/2020 - 08/30/2020 Radiation Therapy   Palliative radiation to the lumbar and pelvic bone; 30 Gy in 10 fractions   08/31/2020 -  Adjuvant Chemotherapy   Started with Herceptin/Perjeta on 08/31/2020 and Taxotere on 09/04/2020; she was hospitalized with dehydration and refractory diarrhea for 11 days, she resumed treatment with Taxotere (dose reduced by  61m/m2) and Herceptin only (perjeta  discontinued) on 09/26/2020   09/03/2020 Cancer Staging   Staging form: Breast, AJCC 8th Edition - Clinical stage from 09/03/2020: Stage IV (cT4b, cN1, cM1, G2, ER+, PR+, HER2+) - Signed by Ellen Phlegm NP on 10/03/2020 Stage prefix: Initial diagnosis Method of lymph node assessment: Clinical Histologic grading system: 3 grade system     CURRENT THERAPY: Taxotere/Herceptin cycle 2 day 8  INTERVAL HISTORY: Ellen Albino25y.o. female returns for evaluation after receiving dose reduced taxotere and herceptin about a week ago.  She tolerated this well.  She notes that she previously experienced diarrhea, however that is slowly resolving.  She had one loose stool today.  She took imodium for diarrhea yesterday and that seemed to clear it up.  She is eating and drinking well and her albumin has improved from 2.5 to 3.1.  She is drinking fluids.    Ellen [name] she is still depressed and not as active as she would like to be to aid in her regaining her strength.  She is tolerating the Effexor well that was started about a week ago by Ellen [name]  She is not having any suicidal ideation.    She has some drainage, and sore spots in her nose, and sometimes her nasal drainage can be blood tinged.  She wants to know if this is normal.     Patient Active Problem List   Diagnosis Date Noted   AKI (acute kidney injury) ([place] 09/09/2020   Genetic testing 09/06/2020   Family history of thyroid cancer 08/29/2020   Family history of pancreatic cancer 08/29/2020   Carcinoma of breast metastatic to bone ([place] 08/17/2020   Metastatic breast cancer ([place] 08/16/2020   Left breast mass 08/13/2020   IUD (intrauterine device) in place 08/13/2020   Genital herpes 08/13/2020   Attention deficit hyperactivity disorder (ADHD), 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   GAD (generalized anxiety disorder) 02/16/2017   Marijuana user 02/16/2017   Mild intermittent asthma without complication  154/10/8117  Severe episode of recurrent major depressive disorder, without psychotic features ([place] 02/16/2017   Depression 04/20/2012    is allergic to other.  MEDICAL HISTORY: Past Medical History:  Diagnosis Date   Anxiety    Asthma    Exercise Induced   [place]cer ([place]    Depression    Family history of pancreatic cancer    Family history of thyroid cancer    Herpes simplex     SURGICAL HISTORY: Past Surgical History:  Procedure Laterality Date   NO PAST SURGERIES     PORTACATH PLACEMENT Right 08/23/2020   Procedure: INSERTION PORT-A-CATH;  Surgeon: TDonnie Mesa MD;  [place] [facility]  Service: General;  Laterality: Right;    SOCIAL HISTORY: Social History   Socioeconomic History   Marital status: Single    Spouse name: Not on file   Number of children: Not on file   Years of education: Not on file   Highest education level: Not on file  Occupational History   Not on file  Tobacco Use   Smoking status: Never   Smokeless tobacco: Never  Vaping Use   Vaping Use: Every day   Substances: THC  Substance and Sexual Activity   Alcohol use: Yes    Alcohol/week: 7.0 standard drinks    Types: 7 Glasses of wine per week   Drug use: Yes    Types: Marijuana   Sexual activity: Yes    Birth control/protection: I.U.D.  Other Topics Concern   Not on file  Social History Narrative   Menarche at age 32, has a cycles every 28 days that lasts about 11 days, 4 days of spotting, two days of heavy flow, and the other days mild to moderate flow.  Received Depo provera shot x 3 years, Nexplanon x 2 years, and currently has kyleena IUD.  No OCPs.  She has never been pregnant.    Social Determinants of Health   Financial Resource Strain: Not on file  Food Insecurity: No Food Insecurity   Worried About Charity fundraiser in the Last Year: Never true   Ran Out of Food in the Last Year: Never true  Transportation Needs: No Transportation Needs   Lack of  Transportation (Medical): No   Lack of Transportation (Non-Medical): No  Physical Activity: Not on file  Stress: Not on file  Social Connections: Not on file  Intimate Partner Violence: Not on file    FAMILY HISTORY: Family History  Problem Relation Age of Onset   Depression Mother    Hyperlipidemia Mother    Thyroid cancer Mother 79       papillary thyroid cancer   Hypertension Father    Atrial fibrillation Father    Irritable bowel syndrome Father        also brother   ADD / ADHD Brother    Bipolar disorder Brother    Diabetes Maternal Grandfather    Heart attack Paternal Great-grandfather 51   Pancreatic cancer Maternal Great-grandfather 88       (MGM's father)    Review of Systems  Constitutional:  Positive for fatigue. Negative for appetite change, chills, fever and unexpected weight change.  HENT:   Negative for hearing loss, lump/mass and trouble swallowing.   Eyes:  Negative for eye problems and icterus.  Respiratory:  Negative for chest tightness, cough and shortness of breath.   Cardiovascular:  Negative for chest pain, leg swelling and palpitations.  Gastrointestinal:  Positive for abdominal pain (mild cramping on occasion), diarrhea (see interval history) and nausea (mild and controlled with anti nausea medication). Negative for abdominal distention, blood in stool, constipation and vomiting.  Endocrine: Negative for hot flashes.  Genitourinary:  Negative for difficulty urinating.   Musculoskeletal:  Negative for arthralgias.  Skin:  Negative for itching and rash.  Neurological:  Negative for dizziness, extremity weakness, headaches and numbness.  Hematological:  Negative for adenopathy. Does not bruise/bleed easily.  Psychiatric/Behavioral:  Positive for depression. Negative for confusion and suicidal ideas. The patient is not nervous/anxious.      PHYSICAL EXAMINATION  ECOG PERFORMANCE STATUS: 2 - Symptomatic, <50% confined to bed  Vitals:   10/03/20 0950   BP: 135/83  Pulse: (!) 130  Resp: 18  Temp: 97.7 F (36.5 C)  SpO2: 98%    Physical Exam Constitutional:      General: She is not in acute distress.    Appearance: Normal appearance. She is not toxic-appearing.  HENT:     Head: Normocephalic and atraumatic.  Eyes:     General: No scleral icterus. Cardiovascular:     Rate and Rhythm: Normal rate and regular rhythm.     Pulses: Normal pulses.     Heart sounds: Normal heart sounds.  Pulmonary:     Effort: Pulmonary effort is normal.     Breath sounds: Normal breath sounds.  Abdominal:     General: Abdomen is flat. Bowel sounds are normal. There is no distension.  Palpations: Abdomen is soft.     Tenderness: There is no abdominal tenderness.  Musculoskeletal:        General: No swelling.     Cervical back: Neck supple.  Lymphadenopathy:     Cervical: No cervical adenopathy.  Skin:    General: Skin is warm and dry.     Findings: No rash.  Neurological:     General: No focal deficit present.     Mental Status: She is alert.  Psychiatric:        Mood and Affect: Mood normal.        Behavior: Behavior normal.    LABORATORY DATA:  CBC    Component Value Date/Time   WBC 1.7 (L) 10/03/2020 0942   WBC 5.7 09/19/2020 0337   RBC 3.54 (L) 10/03/2020 0942   HGB 10.7 (L) 10/03/2020 0942   HCT 31.9 (L) 10/03/2020 0942   PLT 356 10/03/2020 0942   MCV 90.1 10/03/2020 0942   MCH 30.2 10/03/2020 0942   MCHC 33.5 10/03/2020 0942   RDW 14.5 10/03/2020 0942   LYMPHSABS 0.7 10/03/2020 0942   MONOABS 0.2 10/03/2020 0942   EOSABS 0.0 10/03/2020 0942   BASOSABS 0.0 10/03/2020 0942    CMP     Component Value Date/Time   NA 141 10/03/2020 0942   K 3.5 10/03/2020 0942   CL 105 10/03/2020 0942   CO2 27 10/03/2020 0942   GLUCOSE 101 (H) 10/03/2020 0942   BUN <4 (L) 10/03/2020 0942   CREATININE 0.66 10/03/2020 0942   CALCIUM 9.2 10/03/2020 0942   PROT 6.4 (L) 10/03/2020 0942   ALBUMIN 3.1 (L) 10/03/2020 0942   AST 19  10/03/2020 0942   ALT 30 10/03/2020 0942   ALKPHOS 146 (H) 10/03/2020 0942   BILITOT 0.9 10/03/2020 0942   GFRNONAA >60 10/03/2020 0942       ASSESSMENT and THERAPY PLAN:   Carcinoma of breast metastatic to bone [facility]) Metastatic breast cancer ([place]) 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, FISH positive ratio 2.8, Ki-67 25%, lymph node positive   Palliative radiation completed 09/03/2020   Treatment plan: Palliative chemotherapy with Taxotere Herceptin Perjeta every 3 weeks x6 cycles followed by Herceptin Perjeta maintenance  ------------------------------------------------------------------------------------------------------------------------------------ Current treatment: Cycle 2 day 8 Taxotere Herceptin (Perjeta discontinued & Taxotere dose reduced) Toxicities: Hospitalization 7/24-09/19/20: grade III-IV Diarrhea   Diarrhea much improved, now grade 1.  I recommended continued fluid and food intake.    Depression, no suicidal thoughts, if not feeling better in a week will take two of her effexor tablets.  Nasal irritation: she will restart acyclovir and continue with saline nasal spray  Weight loss: encouraged food intake, has f/u with nutrition in a couple of weeks.    Tachycardia: mild, related to treatment, deconditioning.  I recommended continued fluid intake and that she avoid ondansetron due to her h/o prolonged QT interval with it.    She will return in 2 weeks for labs and f/u with Dr. Lindi [name], discussion of treatment.    Total encounter time: 30 minutes  Wilber Bihari, NP 10/03/20 11:00 AM Medical Oncology and Hematology [facility] [place], [place] [address] Tel. [phone]    Fax. [phone]  *Total Encounter Time as defined by the Centers for Medicare  and Medicaid Services includes, in addition to  the face-to-face time of a patient visit (documented in the note above) non-face-to-face time: obtaining and reviewing outside history, ordering and reviewing medications, tests or procedures, care coordination (communications with other health care professionals or caregivers) and documentation in the medical record.

## 2020-10-03 NOTE — Assessment & Plan Note (Addendum)
Metastatic breast cancer ([place]) 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, ER 30%, PR 20%, HER2 equivocal by IHC, FISH positiveratio 2.8, Ki-67 25%, lymph node positive  Palliative radiation completed 09/03/2020  Treatment plan: Palliative chemotherapy with Taxotere Herceptin Perjeta every 3 weeks x6 cycles followed by Herceptin Perjeta maintenance  ------------------------------------------------------------------------------------------------------------------------------------ Current treatment: Cycle 2 day 8 Taxotere Herceptin (Perjeta discontinued & Taxotere dose reduced) Toxicities: Hospitalization 7/24-09/19/20: grade III-IV Diarrhea   Diarrhea much improved, now grade 1.  I recommended continued fluid and food intake.    Depression, no suicidal thoughts, if not feeling better in a week will take two of her effexor tablets.  Nasal irritation: she will restart acyclovir and continue with saline nasal spray  Weight loss: encouraged food intake, has f/u with nutrition in a couple of weeks.    Tachycardia: mild, related to treatment, deconditioning.  I recommended continued fluid intake and that she avoid ondansetron due to her h/o prolonged QT interval with it.    She will return in 2 weeks for labs and f/u with Dr. Lindi Adie, discussion of treatment.

## 2020-10-04 ENCOUNTER — Other Ambulatory Visit (HOSPITAL_COMMUNITY): Payer: Self-pay

## 2020-10-04 ENCOUNTER — Encounter: Payer: Self-pay | Admitting: Hematology and Oncology

## 2020-10-09 ENCOUNTER — Encounter: Payer: Self-pay | Admitting: Hematology and Oncology

## 2020-10-09 ENCOUNTER — Other Ambulatory Visit (HOSPITAL_COMMUNITY): Payer: Self-pay

## 2020-10-10 ENCOUNTER — Encounter: Payer: Self-pay | Admitting: Hematology and Oncology

## 2020-10-10 DIAGNOSIS — Z30432 Encounter for removal of intrauterine contraceptive device: Secondary | ICD-10-CM | POA: Diagnosis not present

## 2020-10-11 ENCOUNTER — Encounter: Payer: Self-pay | Admitting: Hematology and Oncology

## 2020-10-12 ENCOUNTER — Encounter: Payer: Self-pay | Admitting: Hematology and Oncology

## 2020-10-12 ENCOUNTER — Other Ambulatory Visit (HOSPITAL_COMMUNITY): Payer: Self-pay

## 2020-10-15 ENCOUNTER — Other Ambulatory Visit (HOSPITAL_COMMUNITY): Payer: Self-pay

## 2020-10-16 ENCOUNTER — Other Ambulatory Visit (HOSPITAL_COMMUNITY): Payer: Self-pay

## 2020-10-16 ENCOUNTER — Encounter: Payer: Self-pay | Admitting: Hematology and Oncology

## 2020-10-16 MED FILL — Dexamethasone Sodium Phosphate Inj 100 MG/10ML: INTRAMUSCULAR | Qty: 1 | Status: AC

## 2020-10-16 NOTE — Progress Notes (Signed)
Patient Care Team: Pcp, No as PCP - General Ellen Lose, MD as Consulting Physician (Hematology and Oncology) [name], [name], NP as Nurse Practitioner (Hematology and Oncology) [name], MD as Consulting Physician (General Surgery) Mauro Kaufmann, RN as Oncology Nurse Navigator Rockwell Germany, RN as Oncology Nurse Navigator  DIAGNOSIS:    ICD-10-CM   1. 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 Metastatic breast cancer (HCC)  C50.919 CT CHEST ABDOMEN PELVIS W CONTRAST    DISCONTINUED: pertuzumab (PERJETA) 420 mg in sodium chloride 0.9 % 250 mL chemo infusion      SUMMARY OF ONCOLOGIC HISTORY: Oncology History  Metastatic breast cancer ([place])  08/15/2020 Imaging   Large mass in the medial left breast measuring 3.5 cm.  Prominent left axillary lymph node cluster of adjacent lymph nodes, irregular nodule right middle lobe 1.1 cm, aggressive lesion right second rib cortical destruction, multiple lucent lesions throughout the thoracic spine including T1 vertebral body and T2, irregular multilobar lesion central left hepatic lobe 4.3 x 4.7 cm.  Several satellite lesions in the left lateral hepatic lobe.  Multiple sclerotic lesions in the bones iliac wings, acetabular, medial iliac bones, sacrum multiple lesions lumbar spine L3-L4 and L5   08/17/2020 Initial Diagnosis   Biopsy revealed IDC with DCIS grade 2, ER 30%, PR 20%, HER2 equivocal by IHC, FISH positive, Ki-67 25%, lymph node positive    Genetic Testing   Negative genetic testing:  No pathogenic variants detected on the [employer] CancerNext-Expanded + RNAinsight panel. The report date is 09/06/2020.   The CancerNext-Expanded + RNAinsight gene panel offered by [employer] and includes sequencing and rearrangement analysis for the following 77 genes: AIP, ALK, APC,  ATM, AXIN2, BAP1, BARD1, BLM, BMPR1A, BRCA1, BRCA2, BRIP1, CDC73, CDH1, CDK4, CDKN1B, CDKN2A, CHEK2, CTNNA1, DICER1, FANCC, FH, FLCN, GALNT12, KIF1B, LZTR1, MAX, MEN1, MET, MLH1, MSH2, MSH3, MSH6, MUTYH, NBN, NF1, NF2, NTHL1, PALB2, PHOX2B, PMS2, POT1, PRKAR1A, PTCH1, PTEN, RAD51C, RAD51D, RB1, RECQL, RET, SDHA, SDHAF2, SDHB, SDHC, SDHD, SMAD4, SMARCA4, SMARCB1, SMARCE1, STK11, SUFU, TMEM127, TP53, TSC1, TSC2, VHL and XRCC2 (sequencing and deletion/duplication); EGFR, EGLN1, HOXB13, KIT, MITF, PDGFRA, POLD1 and POLE (sequencing only); EPCAM and GREM1 (deletion/duplication only). RNA data is routinely analyzed for use in variant interpretation for all genes.   Carcinoma of breast metastatic to bone ([place])  08/17/2020 Initial Diagnosis   Carcinoma of breast metastatic to bone [facility]); Goserelin/Zoladex started and to be given every 4 weeks    Genetic Testing   Negative genetic testing:  No pathogenic variants detected on the [employer] CancerNext-Expanded + RNAinsight panel. The report date is 09/06/2020.   The CancerNext-Expanded + RNAinsight gene panel offered by [employer] and includes sequencing and rearrangement analysis for the following 77 genes: AIP, ALK, APC, ATM, AXIN2, BAP1, BARD1, BLM, BMPR1A, BRCA1, BRCA2, BRIP1, CDC73, CDH1, CDK4, CDKN1B, CDKN2A, CHEK2, CTNNA1, DICER1, FANCC, FH, FLCN, GALNT12, KIF1B, LZTR1, MAX, MEN1, MET, MLH1, MSH2, MSH3, MSH6, MUTYH, NBN, NF1, NF2, NTHL1, PALB2, PHOX2B, PMS2, POT1, PRKAR1A, PTCH1, PTEN, RAD51C, RAD51D, RB1, RECQL, RET, SDHA, SDHAF2, SDHB, SDHC, SDHD, SMAD4, SMARCA4, SMARCB1, SMARCE1, STK11, SUFU, TMEM127, TP53, TSC1, TSC2, VHL and XRCC2 (sequencing and deletion/duplication); EGFR, EGLN1, HOXB13, KIT, MITF, PDGFRA, POLD1 and POLE (sequencing only); EPCAM and GREM1 (deletion/duplication only). RNA data is routinely analyzed for use in variant interpretation for all genes.  08/18/2020 - 08/30/2020 Radiation Therapy   Palliative radiation to the lumbar and pelvic  bone; 30 Gy in 10 fractions   08/31/2020 -  Adjuvant Chemotherapy   Started with Herceptin/Perjeta on 08/31/2020 and Taxotere on 09/04/2020; she was hospitalized with dehydration and refractory diarrhea for 11 days, she resumed treatment with Taxotere (dose reduced by 45m/m2) and Herceptin only (perjeta discontinued) on 09/26/2020   09/03/2020 Cancer Staging   Staging form: Breast, AJCC 8th Edition - Clinical stage from 09/03/2020: Stage IV (cT4b, cN1, cM1, G2, ER+, PR+, HER2+) - Signed by CGardenia Phlegm NP on 10/03/2020 Stage prefix: Initial diagnosis Method of lymph node assessment: Clinical Histologic grading system: 3 grade system     CHIEF COMPLIANT: Cycle 4 Taxotere Herceptin (adding Perjeta back to the treatment)  INTERVAL HISTORY: Ellen SEASEis a 25y.o. with above-mentioned history of metastatic left breast cancer, currently on chemotherapy with Taxotere Herceptin Perjeta. She reports to the clinic today for treatment.  Last chemotherapy she tolerated extremely well without any major troubles other than the first week of fatigue and slight nausea.  Diarrhea is under very good control.  She has occasional loose stools.  Denies any nausea or vomiting.  She is requesting that we increase the dosage of Taxotere and also to resume Perjeta treatments.  ALLERGIES:  is allergic to other.  MEDICATIONS:  Current Outpatient Medications  Medication Sig Dispense Refill   acyclovir (ZOVIRAX) 800 MG tablet Take 1 tablet (800 mg total) by mouth 2 (two) times daily as needed (flare up). 60 tablet 5   acyclovir ointment (ZOVIRAX) 5 % Apply 1 application topically 4 (four) times daily as needed. outbreak     cholestyramine (QUESTRAN) 4 g packet Take 1 packet mixed with liquid by mouth 2 times daily as needed. 60 each 12   lidocaine-prilocaine (EMLA) cream Apply to affected area once 30 g 3   LORazepam (ATIVAN) 0.5 MG tablet Take 1 tablet (0.5 mg total) by mouth at bedtime as needed for  sleep. 30 tablet 0   metroNIDAZOLE (METROGEL) 1 % gel Apply topically daily. 60 g 0   Multiple Vitamin (MULTIVITAMINS PO) Take 1 tablet by mouth daily.     ondansetron (ZOFRAN) 8 MG tablet Take 1 tablet (8 mg total) by mouth 2 (two) times daily as needed for refractory nausea / vomiting. 30 tablet 1   oxyCODONE-acetaminophen (PERCOCET/ROXICET) 5-325 MG tablet Take 1 tablet by mouth every 8 (eight) hours as needed for severe pain. 60 tablet 0   prochlorperazine (COMPAZINE) 10 MG tablet Take 1 tablet (10 mg total) by mouth every 6 (six) hours as needed for nausea or vomiting. 30 tablet 1   venlafaxine XR (EFFEXOR-XR) 37.5 MG 24 hr capsule Take 1 capsule (37.5 mg total) by mouth daily with breakfast. 90 capsule 3   No current facility-administered medications for this visit.   Facility-Administered Medications Ordered in Other Visits  Medication Dose Route Frequency Provider Last Rate Last Admin   DOCEtaxel (TAXOTERE) 110 mg in sodium chloride 0.9 % 250 mL chemo infusion  65 mg/m2 (Treatment Plan Recorded) Intravenous Once GNicholas Lose MD 261 mL/hr at 10/17/20 1426 110 mg at 10/17/20 1426   heparin lock flush 100 unit/mL  500 Units Intracatheter Once PRN GNicholas Lose MD       sodium chloride flush (NS) 0.9 % injection 10 mL  10 mL Intracatheter PRN GNicholas Lose MD        PHYSICAL EXAMINATION: ECOG PERFORMANCE STATUS: 1 - Symptomatic but completely  ambulatory  Vitals:   10/17/20 1023  BP: 121/78  Pulse: (!) 116  Resp: 18  Temp: 97.9 F (36.6 C)  SpO2: 100%   Filed Weights   10/17/20 1023  Weight: 128 lb 14.4 oz (58.5 kg)    LABORATORY DATA:  I have reviewed the data as listed CMP Latest Ref Rng & Units 10/17/2020 10/03/2020 09/26/2020  Glucose 70 - 99 mg/dL 106(H) 101(H) 101(H)  BUN 6 - 20 mg/dL 12 <4(L) 7  Creatinine 0.44 - 1.00 mg/dL 0.59 0.66 0.55  Sodium 135 - 145 mmol/L 139 141 142  Potassium 3.5 - 5.1 mmol/L 4.7 3.5 3.5  Chloride 98 - 111 mmol/L 107 105 108  CO2 22 -  32 mmol/L 25 27 26   Calcium 8.9 - 10.3 mg/dL 9.1 9.2 8.6(L)  Total Protein 6.5 - 8.1 g/dL 6.2(L) 6.4(L) 5.6(L)  Total Bilirubin 0.3 - 1.2 mg/dL 0.4 0.9 0.7  Alkaline Phos 38 - 126 U/L 102 146(H) 209(H)  AST 15 - 41 U/L 21 19 26   ALT 0 - 44 U/L 18 30 44    Lab Results  Component Value Date   WBC 8.5 10/17/2020   HGB 10.8 (L) 10/17/2020   HCT 33.4 (L) 10/17/2020   MCV 92.0 10/17/2020   PLT 251 10/17/2020   NEUTROABS 6.3 10/17/2020    ASSESSMENT & PLAN:  Metastatic breast cancer ([place]) 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, FISH positive ratio 2.8, Ki-67 25%, lymph node positive   Palliative radiation completed 09/03/2020   Treatment plan: Palliative chemotherapy with Taxotere Herceptin Perjeta every 3 weeks x6 cycles followed by Herceptin Perjeta maintenance  ------------------------------------------------------------------------------------------------------------------------------------ Current treatment: Cycle 3 Taxotere Herceptin (reintroducing Perjeta) Toxicities: Hospitalization 7/24-09/19/20: grade III-IV Diarrhea  Diarrhea much improved, now grade 1.  I recommended continued fluid and food intake.   Depression, no suicidal thoughts, if not feeling better in a week will take two of her effexor tablets.  Weight loss: She has gained some weight and her albumin levels have improved.     By patient request, we decided to reintroduce Perjeta today.  We will keep the dosage of Taxotere the same for the time being. Our plan is to do scans after this cycle so that we can assess her response to treatment. Return to clinic in 3 weeks for cycle 4 and before that scans will be done.      Orders Placed This Encounter  Procedures   CT CHEST ABDOMEN PELVIS W CONTRAST    Standing Status:   Future     Standing Expiration Date:   10/17/2021    Order Specific Question:   If indicated for the ordered procedure, I authorize the administration of contrast media per Radiology protocol    Answer:   Yes    Order Specific Question:   Is patient pregnant?    Answer:   No    Order Specific Question:   Preferred imaging location?    Answer:   [facility]    Order Specific Question:   Release to patient    Answer:   Immediate    Order Specific Question:   Is Oral Contrast requested for this exam?    Answer:   Yes, Per Radiology protocol    Order Specific Question:   Reason for Exam (SYMPTOM  OR DIAGNOSIS REQUIRED)  Answer:   Met breast cancer restaging   The patient has a good understanding of the overall plan. she agrees with it. she will call with any problems that may develop before the next visit here.  Total time spent: 30 mins including face to face time and time spent for planning, charting and coordination of care  Rulon Eisenmenger, MD, MPH 10/17/2020  I, Ellen [name], am acting as scribe for Dr. Nicholas [name].  I have reviewed the above documentation for accuracy and completeness, and I agree with the above.

## 2020-10-17 ENCOUNTER — Other Ambulatory Visit (HOSPITAL_COMMUNITY): Payer: Self-pay

## 2020-10-17 ENCOUNTER — Other Ambulatory Visit: Payer: Self-pay

## 2020-10-17 ENCOUNTER — Ambulatory Visit
Admission: RE | Admit: 2020-10-17 | Discharge: 2020-10-17 | Disposition: A | Discharge status: Home / Self Care | Payer: MEDICAID | Source: Ambulatory Visit | Attending: Urology | Admitting: Urology

## 2020-10-17 ENCOUNTER — Inpatient Hospital Stay (HOSPITAL_BASED_OUTPATIENT_CLINIC_OR_DEPARTMENT_OTHER): Payer: Medicaid Other | Admitting: Hematology and Oncology

## 2020-10-17 ENCOUNTER — Encounter: Payer: Self-pay | Admitting: Hematology and Oncology

## 2020-10-17 ENCOUNTER — Other Ambulatory Visit: Payer: Self-pay | Admitting: *Deleted

## 2020-10-17 ENCOUNTER — Ambulatory Visit: Payer: Medicaid Other

## 2020-10-17 ENCOUNTER — Ambulatory Visit: Payer: Medicaid Other | Admitting: Dietician

## 2020-10-17 ENCOUNTER — Inpatient Hospital Stay: Payer: Medicaid Other

## 2020-10-17 VITALS — HR 94

## 2020-10-17 VITALS — BP 121/78 | HR 116 | Temp 97.9°F | Resp 18 | Ht 65.0 in | Wt 128.9 lb

## 2020-10-17 DIAGNOSIS — C7951 Secondary malignant neoplasm of bone: Secondary | ICD-10-CM | POA: Diagnosis not present

## 2020-10-17 DIAGNOSIS — C50912 Malignant neoplasm of unspecified site of left female breast: Secondary | ICD-10-CM

## 2020-10-17 DIAGNOSIS — Z95828 Presence of other vascular implants and grafts: Secondary | ICD-10-CM

## 2020-10-17 DIAGNOSIS — C50919 Malignant neoplasm of unspecified site of unspecified female breast: Secondary | ICD-10-CM

## 2020-10-17 DIAGNOSIS — Z5111 Encounter for antineoplastic chemotherapy: Secondary | ICD-10-CM | POA: Diagnosis not present

## 2020-10-17 LAB — CBC WITH DIFFERENTIAL (CANCER CENTER ONLY)
Abs Immature Granulocytes: 0.07 10*3/uL (ref 0.00–0.07)
Basophils Absolute: 0 10*3/uL (ref 0.0–0.1)
Basophils Relative: 0 %
Eosinophils Absolute: 0.1 10*3/uL (ref 0.0–0.5)
Eosinophils Relative: 1 %
HCT: 33.4 % — ABNORMAL LOW (ref 36.0–46.0)
Hemoglobin: 10.8 g/dL — ABNORMAL LOW (ref 12.0–15.0)
Immature Granulocytes: 1 %
Lymphocytes Relative: 18 %
Lymphs Abs: 1.5 10*3/uL (ref 0.7–4.0)
MCH: 29.8 pg (ref 26.0–34.0)
MCHC: 32.3 g/dL (ref 30.0–36.0)
MCV: 92 fL (ref 80.0–100.0)
Monocytes Absolute: 0.6 10*3/uL (ref 0.1–1.0)
Monocytes Relative: 7 %
Neutro Abs: 6.3 10*3/uL (ref 1.7–7.7)
Neutrophils Relative %: 73 %
Platelet Count: 251 10*3/uL (ref 150–400)
RBC: 3.63 MIL/uL — ABNORMAL LOW (ref 3.87–5.11)
RDW: 15.9 % — ABNORMAL HIGH (ref 11.5–15.5)
WBC Count: 8.5 10*3/uL (ref 4.0–10.5)
nRBC: 0 % (ref 0.0–0.2)

## 2020-10-17 LAB — CMP (CANCER CENTER ONLY)
ALT: 18 U/L (ref 0–44)
AST: 21 U/L (ref 15–41)
Albumin: 3.3 g/dL — ABNORMAL LOW (ref 3.5–5.0)
Alkaline Phosphatase: 102 U/L (ref 38–126)
Anion gap: 7 (ref 5–15)
BUN: 12 mg/dL (ref 6–20)
CO2: 25 mmol/L (ref 22–32)
Calcium: 9.1 mg/dL (ref 8.9–10.3)
Chloride: 107 mmol/L (ref 98–111)
Creatinine: 0.59 mg/dL (ref 0.44–1.00)
GFR, Estimated: >60 mL/min (ref >60)
Glucose, Bld: 106 mg/dL — ABNORMAL HIGH (ref 70–99)
Potassium: 4.7 mmol/L (ref 3.5–5.1)
Sodium: 139 mmol/L (ref 135–145)
Total Bilirubin: 0.4 mg/dL (ref 0.3–1.2)
Total Protein: 6.2 g/dL — ABNORMAL LOW (ref 6.5–8.1)

## 2020-10-17 MED ORDER — HEPARIN SOD (PORK) LOCK FLUSH 100 UNIT/ML IV SOLN
500.0000 [IU] | Freq: Once | INTRAVENOUS | Status: AC | PRN
  Administered 2020-10-17: 500 [IU]

## 2020-10-17 MED ORDER — SODIUM CHLORIDE 0.9% FLUSH
10.0000 mL | INTRAVENOUS | Status: DC | PRN
  Administered 2020-10-17: 10 mL via INTRAVENOUS

## 2020-10-17 MED ORDER — SODIUM CHLORIDE 0.9 % IV SOLN
65.0000 mg/m2 | Freq: Once | INTRAVENOUS | Status: AC
  Administered 2020-10-17: 110 mg via INTRAVENOUS
  Filled 2020-10-17: qty 11

## 2020-10-17 MED ORDER — SODIUM CHLORIDE 0.9 % IV SOLN
10.0000 mg | Freq: Once | INTRAVENOUS | Status: AC
  Administered 2020-10-17: 10 mg via INTRAVENOUS
  Filled 2020-10-17: qty 10

## 2020-10-17 MED ORDER — SODIUM CHLORIDE 0.9 % IV SOLN
420.0000 mg | Freq: Once | INTRAVENOUS | Status: AC
  Administered 2020-10-17: 420 mg via INTRAVENOUS
  Filled 2020-10-17: qty 14

## 2020-10-17 MED ORDER — SODIUM CHLORIDE 0.9% FLUSH
10.0000 mL | INTRAVENOUS | Status: DC | PRN
  Administered 2020-10-17: 10 mL

## 2020-10-17 MED ORDER — ACETAMINOPHEN 325 MG PO TABS
650.0000 mg | ORAL_TABLET | Freq: Once | ORAL | Status: AC
  Administered 2020-10-17: 650 mg via ORAL

## 2020-10-17 MED ORDER — SODIUM CHLORIDE 0.9 % IV SOLN: Freq: Once | INTRAVENOUS | Status: AC

## 2020-10-17 MED ORDER — TRASTUZUMAB-DKST CHEMO 150 MG IV SOLR
6.0000 mg/kg | Freq: Once | INTRAVENOUS | Status: AC
  Administered 2020-10-17: 378 mg via INTRAVENOUS
  Filled 2020-10-17: qty 18

## 2020-10-17 MED ORDER — VENLAFAXINE HCL ER 37.5 MG PO CP24
37.5000 mg | ORAL_CAPSULE | Freq: Every day | ORAL | 3 refills | Status: DC
  Filled 2020-10-17 – 2020-10-18 (×2): qty 90, 90d supply, fill #0

## 2020-10-17 MED ORDER — METROGEL 1 % EX GEL
Freq: Every day | CUTANEOUS | 0 refills | Status: DC
  Filled 2020-10-17: qty 45, fill #0

## 2020-10-17 MED ORDER — ACETAMINOPHEN 325 MG PO TABS
ORAL_TABLET | ORAL | Status: AC
  Filled 2020-10-17: qty 2

## 2020-10-17 MED ORDER — DIPHENHYDRAMINE HCL 25 MG PO CAPS
25.0000 mg | ORAL_CAPSULE | Freq: Once | ORAL | Status: AC
  Administered 2020-10-17: 25 mg via ORAL

## 2020-10-17 MED ORDER — DIPHENHYDRAMINE HCL 25 MG PO CAPS
ORAL_CAPSULE | ORAL | Status: AC
  Filled 2020-10-17: qty 1

## 2020-10-17 NOTE — Progress Notes (Signed)
  Radiation Oncology         [phone]) [phone] ________________________________  Name: Ellen [name] MRN: [id number]  Date: 09/04/2020  DOB: 1995-12-03  End of Treatment Note  Diagnosis:   25 yo woman with newly diagnosed stage IV breast cancer with painful lumbar and right pelvic bone metastasis     Indication for treatment:  Palliation       Radiation treatment dates:   08/21/20 - 09/04/20  Site/dose:   The sites of painful metastases in the lumbar spine and right pelvis were treated to 30 Gy in 10 fractions of 3 Gy each.  Beams/energy:   A 3D field set-up was employed with 6 MV X-rays  Narrative: The patient tolerated radiation treatment relatively well.   She reported intermittent low back pain, fatigue, low grade nausea and constipation.  Plan: The patient has completed radiation treatment. The patient will return to radiation oncology clinic for routine followup in one month. I advised her to call or return sooner if she has any questions or concerns related to her recovery or treatment. ________________________________  Sheral Apley. Tammi Klippel, M.D.

## 2020-10-17 NOTE — Progress Notes (Signed)
..  Patient Assist/Replace for the following has been terminated. Medication: Zoladex (goserelin acetate) Reason for Termination: Medicaid now active with start date 08/17/2020. Last DOS: 09/26/2020. Marland KitchenJuan Quam, CPhT IV Drug Replacement Specialist [place] Phone: [phone]

## 2020-10-17 NOTE — Assessment & Plan Note (Signed)
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, ER 30%, PR 20%, HER2 equivocal by IHC, FISH positiveratio 2.8, Ki-67 25%, lymph node positive  Palliative radiation completed 09/03/2020  Treatment plan: Palliative chemotherapy with Taxotere Herceptin Perjeta every 3 weeks x6 cycles followed by Herceptin Perjeta maintenance  ------------------------------------------------------------------------------------------------------------------------------------ Current treatment: Cycle 3 TaxotereHerceptin (Perjeta discontinued & Taxotere dose reduced) Toxicities: Hospitalization 7/24-09/19/20: grade III-IV Diarrhea   Diarrhea much improved, now grade 1.  I recommended continued fluid and food intake.    Depression, no suicidal thoughts, if not feeling better in a week will take two of her effexor tablets.  Nasal irritation: she will restart acyclovir and continue with saline nasal spray  Weight loss: encouraged food intake, has f/u with nutrition in a couple of weeks.    Tachycardia: mild, related to treatment, deconditioning.  I recommended continued fluid intake and that she avoid ondansetron due to her h/o prolonged QT interval with it.    Return to clinic in 3 weeks for cycle 4

## 2020-10-17 NOTE — Progress Notes (Signed)
..  Patient Assist/Replace for the following has been terminated. Medication: Ogivri (trastuzumab-dkst) Reason for Termination: Medicaid now active with start date 08/17/2020. Last DOS: 09/26/2020. Marland KitchenJuan Quam, CPhT IV Drug Replacement Specialist [place] Phone: [phone]

## 2020-10-17 NOTE — Progress Notes (Signed)
..  Patient Assist/Replace for the following has been terminated. Medication: Perjeta (pertuzumab) Reason for Termination: Medicaid now active with start date 08/17/2020. Last DOS: 08/31/2020. Marland KitchenJuan Quam, CPhT IV Drug Replacement Specialist [place] Phone: [phone]

## 2020-10-17 NOTE — Progress Notes (Addendum)
..  Patient Assist/Replace for the following has been terminated. Medication: Udenyca (pegfilgrastim-cbqv) Reason for Termination: Medicaid now active with start date 08/17/2020. Last DOS: none. Marland KitchenJuan Quam, CPhT IV Drug Replacement Specialist [place] Phone: [phone]

## 2020-10-17 NOTE — Progress Notes (Signed)
Radiation Oncology         [phone] ________________________________  Name: KYNNEDI DER MRN: [id number]  Date: 10/17/2020  DOB: 1995/07/22  Post Treatment Note  CC: Pcp, No  [name], MD  Diagnosis:   25 yo woman with newly diagnosed stage IV breast cancer with painful lumbar and right pelvic bone metastasis     Interval Since Last Radiation:  6 weeks  08/21/20 - 09/04/20:   The sites of painful metastases in the lumbar spine and right pelvis were treated to 30 Gy in 10 fractions of 3 Gy each.  Narrative:  I spoke with the patient to conduct her routine scheduled 1 month follow up visit via telephone to spare the patient unnecessary potential exposure in the healthcare setting during the current COVID-19 pandemic.  The patient was notified in advance and gave permission to proceed with this visit format. She reported intermittent low back pain, fatigue, low grade nausea and constipation.                              On review of systems, the patient states that she is doing well in general.  She reports improved pain and is no longer requiring narcotic pain medications for management.  She has some occasional pain with positional changes or when sitting for any period of time but this is improved with standing.  She specifically denies any paresthesias or focal weakness in the upper or lower extremities.  She denies any changes in bowel or bladder function.  She did require a dose adjustment in her systemic therapy due to adverse side effects with severe diarrhea but she has since completed 2 cycles and is scheduled for her third cycle of systemic therapy later this morning.  Overall, she feels that she is tolerating her treatments well and is pleased with her progress to date.  ALLERGIES:  is allergic to other.  Meds: Current Outpatient Medications  Medication Sig Dispense Refill   acyclovir (ZOVIRAX) 800 MG tablet Take 1 tablet (800 mg total) by mouth 2 (two) times daily as  needed (flare up). 60 tablet 5   acyclovir ointment (ZOVIRAX) 5 % Apply 1 application topically 4 (four) times daily as needed. outbreak     cholestyramine (QUESTRAN) 4 g packet Take 1 packet mixed with liquid by mouth 2 times daily as needed. 60 each 12   lidocaine-prilocaine (EMLA) cream Apply to affected area once 30 g 3   LORazepam (ATIVAN) 0.5 MG tablet Take 1 tablet (0.5 mg total) by mouth at bedtime as needed for sleep. 30 tablet 0   metroNIDAZOLE (METROGEL) 1 % gel Apply topically daily. 60 g 0   Multiple Vitamin (MULTIVITAMINS PO) Take 1 tablet by mouth daily.     ondansetron (ZOFRAN) 8 MG tablet Take 1 tablet (8 mg total) by mouth 2 (two) times daily as needed for refractory nausea / vomiting. 30 tablet 1   oxyCODONE-acetaminophen (PERCOCET/ROXICET) 5-325 MG tablet Take 1 tablet by mouth every 8 (eight) hours as needed for severe pain. 60 tablet 0   prochlorperazine (COMPAZINE) 10 MG tablet Take 1 tablet (10 mg total) by mouth every 6 (six) hours as needed for nausea or vomiting. 30 tablet 1   venlafaxine XR (EFFEXOR-XR) 37.5 MG 24 hr capsule Take 1 capsule (37.5 mg total) by mouth daily with breakfast. 30 capsule 3   No current facility-administered medications for this encounter.    Physical Findings:  vitals were not taken for this visit.  Pain Assessment Pain Score: 3  Pain Loc: Back/10 Unable to assess due to telephone follow-up visit format.  Lab Findings: Lab Results  Component Value Date   WBC 1.7 (L) 10/03/2020   HGB 10.7 (L) 10/03/2020   HCT 31.9 (L) 10/03/2020   MCV 90.1 10/03/2020   PLT 356 10/03/2020     Radiographic Findings: No results found.  Impression/Plan: 36. 25 yo woman with newly diagnosed stage IV breast cancer with painful lumbar and right pelvic bone metastasis. She appears to have recovered well from the effects of her recent radiotherapy and is currently without complaints.  We discussed that while we are happy to continue to participate in  her care if clinically indicated, at this point, we will plan to see her back on an as-needed basis.  She will continue in routine follow-up under the care and direction of Dr. Lindi Adie for continued management of her systemic therapy but knows that she is welcome to call at anytime with any questions or concerns related to her previous radiotherapy.  We enjoyed taking care of her and look forward to continuing to follow her progress via correspondence.   Nicholos Johns, PA-C

## 2020-10-17 NOTE — Progress Notes (Signed)
Nutrition Assessment  Reason for Assessment: MST   ASSESSMENT: 25 year old female with metastatic left breast cancer. She completed palliative radiation therapy 7/18. Patient is currently receiving chemotherapy with Taxotere/Herceptin/Perjeta. Patient is followed by Dr. Lindi Adie.   Past medical history includes ADHD, anxiety and recent hospital admission 7/24-8/3 for chemotherapy induced nausea with vomiting, diarrhea.   Met with patient during infusion. She reports appetite has improved, diarrhea has resolved and has felt good over the last week. Patient reports she is eating normally. She typically does not eat breakfast until later in the morning. Recalls oatmeal, cereals, has sandwiches for lunch, balanced dinner, drinks 72 oz water and Boost supplement but not everyday. Patient reports having 7-10 episodes of diarrhea that began the after first chemotherapy lasting for ~2 months. She reports 2 episodes when getting up in the morning and after every meal. She is planning to eat soft diet over the next few days (potato soup, bananas, white rice). Patient reports having some food aversions to certain foods (strawberries d/t seeds, lettuce with stems) Patient asking for suggestions on ways to incorporate these foods into her diet.     Medications: Oxycodone, Zofran, Compazine, MVI, Ativan  Labs: Glucose 106   Anthropometrics: Patient reports usual weight 115-120 lb most of her life, states at 22 she gained weight, reports more recent usual weight ~135 lb. Patient reports family encouraged her to eat all the time to gain weight before starting treatment, says weights increased to around 145 lb at that time.   Height: 5'5" Weight: 128 lb 14.4 oz today UBW: 135 lb (per pt) BMI: 21.45   NUTRITION DIAGNOSIS: Unintentional weight loss related to cancer and associated treatments as evidenced by diarrhea, unintentional 16 lb (11%) weight loss in 6 weeks. This is significant for time frame.      INTERVENTION:  Encouraged eating smaller more frequent meals and snacks with adequate calories and protein - handout and recipes provided Discussed strategies for diarrhea - handout provided Educated on [employer] supplement, provided samples for pt to try and ordering information Encouraged drinking 1-2 Ensure Plus/equivalent daily for added calories and protein, Ensure Complete sample and coupons provided  Discussed ways to incorporate more variety of fruits/vegetables into diet as tolerated - smoothie recipes provided Contact information provided    MONITORING, EVALUATION, GOAL: Patient will tolerate adequate calories and protein to promote weight stability   Next Visit: Wednesday September 21 during infusion

## 2020-10-17 NOTE — Progress Notes (Signed)
Patient  due for one month follow up after receiving radiation treatment to pelvis. Patient reports having mild back pain when up and moving rating 3/10. Reports back pain has improved greatly. Patient continues to have moderate level of fatigue, reports depression and patient has recently started taking Effexor.  Patient reports having diarrhea that was just resolved within the last week, Currently taking Lomotil. Patient denies any bladder issues. Patient reports having some discoloration to lower back. Appetite is fair.  Made patient aware that she has a telephone visit with PA, patient voiced understanding.

## 2020-10-17 NOTE — Patient Instructions (Signed)
[facility]COL[facility]  Discharge Instructions: Thank you for choosing [facility] to provide your oncology and hematology care.   If you have a lab appointment with the [facility], please go directly to the [facility] and check in at the registration area.   Wear comfortable clothing and clothing appropriate for easy access to any Portacath or PICC line.   We strive to give you quality time with your provider. You may need to reschedule your appointment if you arrive late (15 or more minutes).  Arriving late affects you and other patients whose appointments are after yours.  Also, if you miss three or more appointments without notifying the office, you may be dismissed from the clinic at the provider's discretion.      For prescription refill requests, have your pharmacy contact our office and allow 72 hours for refills to be completed.    Today you received the following chemotherapy and/or immunotherapy agents: herceptin/perjeta/taxotere      To help prevent nausea and vomiting after your treatment, we encourage you to take your nausea medication as directed.  BELOW ARE SYMPTOMS THAT SHOULD BE REPORTED IMMEDIATELY: *FEVER GREATER THAN 100.4 F (38 C) OR HIGHER *CHILLS OR SWEATING *NAUSEA AND VOMITING THAT IS NOT CONTROLLED WITH YOUR NAUSEA MEDICATION *UNUSUAL SHORTNESS OF BREATH *UNUSUAL BRUISING OR BLEEDING *URINARY PROBLEMS (pain or burning when urinating, or frequent urination) *BOWEL PROBLEMS (unusual diarrhea, constipation, pain near the anus) TENDERNESS IN MOUTH AND THROAT WITH OR WITHOUT PRESENCE OF ULCERS (sore throat, sores in mouth, or a toothache) UNUSUAL RASH, SWELLING OR PAIN  UNUSUAL VAGINAL DISCHARGE OR ITCHING   Items with * indicate a potential emergency and should be followed up as soon as possible or go to the Emergency Department if any problems should occur.  Please show the CHEMOTHERAPY ALERT CARD or IMMUNOTHERAPY ALERT  CARD at check-in to the Emergency Department and triage nurse.  Should you have questions after your visit or need to cancel or reschedule your appointment, please contact [facility]  Dept: [phone]  and follow the prompts.  Office hours are 8:00 a.m. to 4:30 p.m. Monday - Friday. Please note that voicemails left after 4:00 p.m. may not be returned until the following business day.  We are closed weekends and major holidays. You have access to a nurse at all times for urgent questions. Please call the main number to the clinic Dept: [phone] and follow the prompts.   For any non-urgent questions, you may also contact your provider using MyChart. We now offer e-Visits for anyone 46 and older to request care online for non-urgent symptoms. For details visit [email or web address].   Also download the MyChart app! Go to the app store, search "MyChart", open the app, select [facility], and log in with your MyChart username and password.  Due to Covid, a mask is required upon entering the hospital/clinic. If you do not have a mask, one will be given to you upon arrival. For doctor visits, patients may have 1 support person aged 72 or older with them. For treatment visits, patients cannot have anyone with them due to current Covid guidelines and our immunocompromised population.

## 2020-10-18 ENCOUNTER — Other Ambulatory Visit (HOSPITAL_COMMUNITY): Payer: Self-pay

## 2020-10-18 ENCOUNTER — Telehealth: Payer: Self-pay | Admitting: Hematology and Oncology

## 2020-10-18 NOTE — Telephone Encounter (Signed)
Scheduled appointment per 08/31 los. Left message.

## 2020-10-19 ENCOUNTER — Encounter: Payer: Self-pay | Admitting: Hematology and Oncology

## 2020-10-19 ENCOUNTER — Ambulatory Visit: Payer: Self-pay

## 2020-10-24 ENCOUNTER — Other Ambulatory Visit: Payer: Self-pay

## 2020-10-24 ENCOUNTER — Inpatient Hospital Stay: Payer: Medicaid Other | Attending: Adult Health

## 2020-10-24 VITALS — BP 120/80 | HR 115 | Temp 98.6°F | Resp 18

## 2020-10-24 DIAGNOSIS — C787 Secondary malignant neoplasm of liver and intrahepatic bile duct: Secondary | ICD-10-CM | POA: Insufficient documentation

## 2020-10-24 DIAGNOSIS — C50112 Malignant neoplasm of central portion of left female breast: Secondary | ICD-10-CM | POA: Diagnosis not present

## 2020-10-24 DIAGNOSIS — Z5112 Encounter for antineoplastic immunotherapy: Secondary | ICD-10-CM | POA: Diagnosis not present

## 2020-10-24 DIAGNOSIS — C50919 Malignant neoplasm of unspecified site of unspecified female breast: Secondary | ICD-10-CM

## 2020-10-24 DIAGNOSIS — R197 Diarrhea, unspecified: Secondary | ICD-10-CM | POA: Insufficient documentation

## 2020-10-24 DIAGNOSIS — C7951 Secondary malignant neoplasm of bone: Secondary | ICD-10-CM | POA: Insufficient documentation

## 2020-10-24 DIAGNOSIS — Z9221 Personal history of antineoplastic chemotherapy: Secondary | ICD-10-CM | POA: Diagnosis not present

## 2020-10-24 DIAGNOSIS — F32A Depression, unspecified: Secondary | ICD-10-CM | POA: Insufficient documentation

## 2020-10-24 DIAGNOSIS — Z923 Personal history of irradiation: Secondary | ICD-10-CM | POA: Diagnosis not present

## 2020-10-24 DIAGNOSIS — Z5111 Encounter for antineoplastic chemotherapy: Secondary | ICD-10-CM | POA: Diagnosis present

## 2020-10-24 DIAGNOSIS — R918 Other nonspecific abnormal finding of lung field: Secondary | ICD-10-CM | POA: Insufficient documentation

## 2020-10-24 DIAGNOSIS — Z5189 Encounter for other specified aftercare: Secondary | ICD-10-CM | POA: Insufficient documentation

## 2020-10-24 MED ORDER — GOSERELIN ACETATE 3.6 MG ~~LOC~~ IMPL
3.6000 mg | DRUG_IMPLANT | Freq: Once | SUBCUTANEOUS | Status: AC
  Administered 2020-10-24: 3.6 mg via SUBCUTANEOUS
  Filled 2020-10-24: qty 3.6

## 2020-11-06 ENCOUNTER — Ambulatory Visit (HOSPITAL_COMMUNITY)
Admission: RE | Admit: 2020-11-06 | Discharge: 2020-11-06 | Disposition: A | Discharge status: Home / Self Care | Payer: Medicaid Other | Source: Ambulatory Visit | Attending: Hematology and Oncology | Admitting: Hematology and Oncology

## 2020-11-06 ENCOUNTER — Other Ambulatory Visit: Payer: Self-pay

## 2020-11-06 ENCOUNTER — Telehealth: Payer: Self-pay

## 2020-11-06 DIAGNOSIS — C7951 Secondary malignant neoplasm of bone: Secondary | ICD-10-CM | POA: Insufficient documentation

## 2020-11-06 DIAGNOSIS — R918 Other nonspecific abnormal finding of lung field: Secondary | ICD-10-CM | POA: Diagnosis not present

## 2020-11-06 DIAGNOSIS — C50919 Malignant neoplasm of unspecified site of unspecified female breast: Secondary | ICD-10-CM | POA: Diagnosis present

## 2020-11-06 DIAGNOSIS — K7689 Other specified diseases of liver: Secondary | ICD-10-CM | POA: Diagnosis not present

## 2020-11-06 DIAGNOSIS — C50912 Malignant neoplasm of unspecified site of left female breast: Secondary | ICD-10-CM | POA: Diagnosis present

## 2020-11-06 MED ORDER — IOHEXOL 350 MG/ML SOLN
80.0000 mL | Freq: Once | INTRAVENOUS | Status: AC | PRN
  Administered 2020-11-06: 80 mL via INTRAVENOUS

## 2020-11-06 MED FILL — Dexamethasone Sodium Phosphate Inj 100 MG/10ML: INTRAMUSCULAR | Qty: 1 | Status: AC

## 2020-11-06 NOTE — Telephone Encounter (Signed)
Error

## 2020-11-06 NOTE — Assessment & Plan Note (Signed)
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 I recommended continued fluid and food intake.  Depression, no suicidal thoughts, if not feeling better in a week will take two of her effexor tablets.  Weight loss: She has gained some weight and her albumin levels have improved.  By patient request, we decided to reintroduce Perjeta today.  We will keep the dosage of Taxotere the same for the time being.  CT CAP 11/06/2020:

## 2020-11-06 NOTE — Progress Notes (Signed)
Patient Care Team: Pcp, No as PCP - General [name], MD as Consulting Physician (Hematology and Oncology) [name], [name], NP as Nurse Practitioner (Hematology and Oncology) [name], MD as Consulting Physician (General Surgery) Mauro Kaufmann, RN as Oncology Nurse Navigator Rockwell Germany, RN as Oncology Nurse Navigator  DIAGNOSIS:    ICD-10-CM   1. Carcinoma of breast metastatic to bone, unspecified laterality ([place])  C50.919    C79.51       SUMMARY OF ONCOLOGIC HISTORY: Oncology History  Metastatic breast cancer ([place])  08/15/2020 Imaging   Large mass in the medial left breast measuring 3.5 cm.  Prominent left axillary lymph node cluster of adjacent lymph nodes, irregular nodule right middle lobe 1.1 cm, aggressive lesion right second rib cortical destruction, multiple lucent lesions throughout the thoracic spine including T1 vertebral body and T2, irregular multilobar lesion central left hepatic lobe 4.3 x 4.7 cm.  Several satellite lesions in the left lateral hepatic lobe.  Multiple sclerotic lesions in the bones iliac wings, acetabular, medial iliac bones, sacrum multiple lesions lumbar spine L3-L4 and L5   08/17/2020 Initial Diagnosis   Biopsy revealed IDC with DCIS grade 2, ER 30%, PR 20%, HER2 equivocal by IHC, FISH positive, Ki-67 25%, lymph node positive    Genetic Testing   Negative genetic testing:  No pathogenic variants detected on the [employer] CancerNext-Expanded + RNAinsight panel. The report date is 09/06/2020.   The CancerNext-Expanded + RNAinsight gene panel offered by [employer] and includes sequencing and rearrangement analysis for the following 77 genes: AIP, ALK, APC, ATM, AXIN2, BAP1, BARD1, BLM, BMPR1A, BRCA1, BRCA2, BRIP1, CDC73, CDH1, CDK4, CDKN1B, CDKN2A, CHEK2, CTNNA1, DICER1, FANCC, FH, FLCN, GALNT12, KIF1B, LZTR1, MAX, MEN1, MET, MLH1, MSH2, MSH3, MSH6, MUTYH, NBN, NF1, NF2, NTHL1, PALB2, PHOX2B, PMS2, POT1, PRKAR1A, PTCH1, PTEN,  RAD51C, RAD51D, RB1, RECQL, RET, SDHA, SDHAF2, SDHB, SDHC, SDHD, SMAD4, SMARCA4, SMARCB1, SMARCE1, STK11, SUFU, TMEM127, TP53, TSC1, TSC2, VHL and XRCC2 (sequencing and deletion/duplication); EGFR, EGLN1, HOXB13, KIT, MITF, PDGFRA, POLD1 and POLE (sequencing only); EPCAM and GREM1 (deletion/duplication only). RNA data is routinely analyzed for use in variant interpretation for all genes.   Carcinoma of breast metastatic to bone ([place])  08/17/2020 Initial Diagnosis   Carcinoma of breast metastatic to bone [facility]); Goserelin/Zoladex started and to be given every 4 weeks    Genetic Testing   Negative genetic testing:  No pathogenic variants detected on the [employer] CancerNext-Expanded + RNAinsight panel. The report date is 09/06/2020.   The CancerNext-Expanded + RNAinsight gene panel offered by [employer] and includes sequencing and rearrangement analysis for the following 77 genes: AIP, ALK, APC, ATM, AXIN2, BAP1, BARD1, BLM, BMPR1A, BRCA1, BRCA2, BRIP1, CDC73, CDH1, CDK4, CDKN1B, CDKN2A, CHEK2, CTNNA1, DICER1, FANCC, FH, FLCN, GALNT12, KIF1B, LZTR1, MAX, MEN1, MET, MLH1, MSH2, MSH3, MSH6, MUTYH, NBN, NF1, NF2, NTHL1, PALB2, PHOX2B, PMS2, POT1, PRKAR1A, PTCH1, PTEN, RAD51C, RAD51D, RB1, RECQL, RET, SDHA, SDHAF2, SDHB, SDHC, SDHD, SMAD4, SMARCA4, SMARCB1, SMARCE1, STK11, SUFU, TMEM127, TP53, TSC1, TSC2, VHL and XRCC2 (sequencing and deletion/duplication); EGFR, EGLN1, HOXB13, KIT, MITF, PDGFRA, POLD1 and POLE (sequencing only); EPCAM and GREM1 (deletion/duplication only). RNA data is routinely analyzed for use in variant interpretation for all genes.   08/18/2020 - 08/30/2020 Radiation Therapy   Palliative radiation to the lumbar and pelvic bone; 30 Gy in 10 fractions   08/31/2020 -  Adjuvant Chemotherapy   Started with Herceptin/Perjeta on 08/31/2020 and Taxotere on 09/04/2020; she was hospitalized with dehydration and refractory diarrhea for 11  days, she resumed treatment with Taxotere (dose reduced by 72m/m2)  and Herceptin only (perjeta discontinued) on 09/26/2020, reintroduced 10/17/2020   09/03/2020 Cancer Staging   Staging form: Breast, AJCC 8th Edition - Clinical stage from 09/03/2020: Stage IV (cT4b, cN1, cM1, G2, ER+, PR+, HER2+) - Signed by CGardenia Phlegm NP on 10/03/2020 Stage prefix: Initial diagnosis Method of lymph node assessment: Clinical Histologic grading system: 3 grade system     CHIEF COMPLIANT: Cycle 4 Taxotere Herceptin and Perjeta  INTERVAL HISTORY: Ellen RECINEis a 25y.o. with above-mentioned history of metastatic left breast cancer, currently on chemotherapy with Taxotere Herceptin Perjeta. She reports to the clinic today for treatment.  Last treatment we added Perjeta and she tolerated it fairly well.  Did not have any nausea or vomiting did not have any diarrhea.  She had mild diarrhea for which she took a diarrheal medication which helped her.  ALLERGIES:  is allergic to other.  MEDICATIONS:  Current Outpatient Medications  Medication Sig Dispense Refill   METROGEL 1 % gel Apply topically daily. 45 g 0   acyclovir (ZOVIRAX) 800 MG tablet Take 1 tablet (800 mg total) by mouth 2 (two) times daily as needed (flare up). 60 tablet 5   acyclovir ointment (ZOVIRAX) 5 % Apply 1 application topically 4 (four) times daily as needed. outbreak     cholestyramine (QUESTRAN) 4 g packet Take 1 packet mixed with liquid by mouth 2 times daily as needed. 60 each 12   lidocaine-prilocaine (EMLA) cream Apply to affected area once 30 g 3   LORazepam (ATIVAN) 0.5 MG tablet Take 1 tablet (0.5 mg total) by mouth at bedtime as needed for sleep. 30 tablet 0   Multiple Vitamin (MULTIVITAMINS PO) Take 1 tablet by mouth daily.     ondansetron (ZOFRAN) 8 MG tablet Take 1 tablet (8 mg total) by mouth 2 (two) times daily as needed for refractory nausea / vomiting. 30 tablet 1   oxyCODONE-acetaminophen (PERCOCET/ROXICET) 5-325 MG tablet Take 1 tablet by mouth every 8 (eight) hours as  needed for severe pain. 60 tablet 0   prochlorperazine (COMPAZINE) 10 MG tablet Take 1 tablet (10 mg total) by mouth every 6 (six) hours as needed for nausea or vomiting. 30 tablet 1   venlafaxine XR (EFFEXOR-XR) 150 MG 24 hr capsule Take 1 capsule (150 mg total) by mouth daily with breakfast. 30 capsule 6   No current facility-administered medications for this visit.   Facility-Administered Medications Ordered in Other Visits  Medication Dose Route Frequency Provider Last Rate Last Admin   DOCEtaxel (TAXOTERE) 110 mg in sodium chloride 0.9 % 250 mL chemo infusion  65 mg/m2 (Treatment Plan Recorded) Intravenous Once GNicholas Lose MD       heparin lock flush 100 unit/mL  500 Units Intracatheter Once PRN GNicholas Lose MD       sodium chloride flush (NS) 0.9 % injection 10 mL  10 mL Intracatheter PRN GNicholas Lose MD        PHYSICAL EXAMINATION: ECOG PERFORMANCE STATUS: 1 - Symptomatic but completely ambulatory  Vitals:   11/07/20 1103  BP: 115/79  Pulse: 96  Resp: 18  Temp: 97.7 F (36.5 C)  SpO2: 100%   Filed Weights   11/07/20 1103  Weight: 132 lb 1.6 oz (59.9 kg)    LABORATORY DATA:  I have reviewed the data as listed CMP Latest Ref Rng & Units 11/07/2020 10/17/2020 10/03/2020  Glucose 70 - 99 mg/dL 90 106(H) 101(H)  BUN  6 - 20 mg/dL 7 12 <4(L)  Creatinine 0.44 - 1.00 mg/dL 0.65 0.59 0.66  Sodium 135 - 145 mmol/L 141 139 141  Potassium 3.5 - 5.1 mmol/L 4.2 4.7 3.5  Chloride 98 - 111 mmol/L 109 107 105  CO2 22 - 32 mmol/L _0 Calcium 8.9 - 10.3 mg/dL 9.0 9.1 9.2  Total Protein 6.5 - 8.1 g/dL 6.2(L) 6.2(L) 6.4(L)  Total Bilirubin 0.3 - 1.2 mg/dL 0.3 0.4 0.9  Alkaline Phos 38 - 126 U/L 68 102 146(H)  AST 15 - 41 U/L 12(L) 21 19  ALT 0 - 44 U/L _1 Lab Results  Component Value Date   WBC 8.4 11/07/2020   HGB 11.1 (L) 11/07/2020   HCT 34.1 (L) 11/07/2020   MCV 90.0 11/07/2020   PLT 286 11/07/2020   NEUTROABS 6.4 11/07/2020    ASSESSMENT & PLAN:   Carcinoma of breast metastatic to bone ([place]) 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, FISH positive ratio 2.8, Ki-67 25%, lymph node positive   Palliative radiation completed 09/03/2020   Treatment plan: Palliative chemotherapy with Taxotere Herceptin Perjeta every 3 weeks x6 cycles followed by Herceptin Perjeta maintenance  ------------------------------------------------------------------------------------------------------------------------------------ Current treatment: Cycle 4 Taxotere Herceptin (reintroducing Perjeta) Toxicities: Hospitalization 7/24-09/19/20: grade III-IV Diarrhea  Diarrhea much improved, now grade 1.  I recommended continued fluid and food intake.   Depression, no suicidal thoughts, if not feeling better in a week will take two of her effexor tablets.   Weight loss: She has gained some weight and her albumin levels have improved.    CT CAP 11/06/2020: To my evaluation there appears to be remarkable improvement in the liver metastases as well as lung metastases.  The bone metastases also appears to be better.  Official report is not yet available.  Continue with current treatment for total of 6 cycles and after that she will go on maintenance of Herceptin and Perjeta.  No orders of the defined types were placed in this encounter.  The patient has a good understanding of the overall plan. she agrees with it. she will call with any problems that may develop before the next visit here.  Total time spent: 30 mins including face to face time and time spent for planning, charting and coordination of care  Rulon Eisenmenger, MD, MPH 11/07/2020  I, Thana Ates, am acting as scribe for Dr. [name].  I have reviewed the above documentation for accuracy and completeness, and I agree with the  above.

## 2020-11-07 ENCOUNTER — Other Ambulatory Visit (HOSPITAL_COMMUNITY): Payer: Self-pay

## 2020-11-07 ENCOUNTER — Inpatient Hospital Stay (HOSPITAL_BASED_OUTPATIENT_CLINIC_OR_DEPARTMENT_OTHER): Payer: Medicaid Other | Admitting: Hematology and Oncology

## 2020-11-07 ENCOUNTER — Inpatient Hospital Stay: Payer: Medicaid Other

## 2020-11-07 ENCOUNTER — Encounter: Payer: Self-pay | Admitting: *Deleted

## 2020-11-07 ENCOUNTER — Inpatient Hospital Stay: Payer: Medicaid Other | Admitting: Dietician

## 2020-11-07 DIAGNOSIS — C50919 Malignant neoplasm of unspecified site of unspecified female breast: Secondary | ICD-10-CM

## 2020-11-07 DIAGNOSIS — C50912 Malignant neoplasm of unspecified site of left female breast: Secondary | ICD-10-CM

## 2020-11-07 DIAGNOSIS — C7951 Secondary malignant neoplasm of bone: Secondary | ICD-10-CM | POA: Diagnosis not present

## 2020-11-07 DIAGNOSIS — Z5111 Encounter for antineoplastic chemotherapy: Secondary | ICD-10-CM | POA: Diagnosis not present

## 2020-11-07 LAB — CBC WITH DIFFERENTIAL (CANCER CENTER ONLY)
Abs Immature Granulocytes: 0.04 10*3/uL (ref 0.00–0.07)
Basophils Absolute: 0 10*3/uL (ref 0.0–0.1)
Basophils Relative: 0 %
Eosinophils Absolute: 0.1 10*3/uL (ref 0.0–0.5)
Eosinophils Relative: 1 %
HCT: 34.1 % — ABNORMAL LOW (ref 36.0–46.0)
Hemoglobin: 11.1 g/dL — ABNORMAL LOW (ref 12.0–15.0)
Immature Granulocytes: 1 %
Lymphocytes Relative: 15 %
Lymphs Abs: 1.3 10*3/uL (ref 0.7–4.0)
MCH: 29.3 pg (ref 26.0–34.0)
MCHC: 32.6 g/dL (ref 30.0–36.0)
MCV: 90 fL (ref 80.0–100.0)
Monocytes Absolute: 0.6 10*3/uL (ref 0.1–1.0)
Monocytes Relative: 7 %
Neutro Abs: 6.4 10*3/uL (ref 1.7–7.7)
Neutrophils Relative %: 76 %
Platelet Count: 286 10*3/uL (ref 150–400)
RBC: 3.79 MIL/uL — ABNORMAL LOW (ref 3.87–5.11)
RDW: 15.8 % — ABNORMAL HIGH (ref 11.5–15.5)
WBC Count: 8.4 10*3/uL (ref 4.0–10.5)
nRBC: 0 % (ref 0.0–0.2)

## 2020-11-07 LAB — CMP (CANCER CENTER ONLY)
ALT: 8 U/L (ref 0–44)
AST: 12 U/L — ABNORMAL LOW (ref 15–41)
Albumin: 3.5 g/dL (ref 3.5–5.0)
Alkaline Phosphatase: 68 U/L (ref 38–126)
Anion gap: 9 (ref 5–15)
BUN: 7 mg/dL (ref 6–20)
CO2: 23 mmol/L (ref 22–32)
Calcium: 9 mg/dL (ref 8.9–10.3)
Chloride: 109 mmol/L (ref 98–111)
Creatinine: 0.65 mg/dL (ref 0.44–1.00)
GFR, Estimated: >60 mL/min (ref >60)
Glucose, Bld: 90 mg/dL (ref 70–99)
Potassium: 4.2 mmol/L (ref 3.5–5.1)
Sodium: 141 mmol/L (ref 135–145)
Total Bilirubin: 0.3 mg/dL (ref 0.3–1.2)
Total Protein: 6.2 g/dL — ABNORMAL LOW (ref 6.5–8.1)

## 2020-11-07 MED ORDER — HEPARIN SOD (PORK) LOCK FLUSH 100 UNIT/ML IV SOLN
500.0000 [IU] | Freq: Once | INTRAVENOUS | Status: AC | PRN
  Administered 2020-11-07: 500 [IU]

## 2020-11-07 MED ORDER — SODIUM CHLORIDE 0.9 % IV SOLN: Freq: Once | INTRAVENOUS | Status: AC

## 2020-11-07 MED ORDER — SODIUM CHLORIDE 0.9 % IV SOLN
420.0000 mg | Freq: Once | INTRAVENOUS | Status: AC
  Administered 2020-11-07: 420 mg via INTRAVENOUS
  Filled 2020-11-07: qty 14

## 2020-11-07 MED ORDER — SODIUM CHLORIDE 0.9% FLUSH
10.0000 mL | INTRAVENOUS | Status: DC | PRN
  Administered 2020-11-07: 10 mL

## 2020-11-07 MED ORDER — VENLAFAXINE HCL ER 150 MG PO CP24
150.0000 mg | ORAL_CAPSULE | Freq: Every day | ORAL | 6 refills | Status: DC
  Filled 2020-11-07 – 2020-11-19 (×2): qty 30, 30d supply, fill #0

## 2020-11-07 MED ORDER — ACETAMINOPHEN 325 MG PO TABS
650.0000 mg | ORAL_TABLET | Freq: Once | ORAL | Status: AC
  Administered 2020-11-07: 650 mg via ORAL
  Filled 2020-11-07: qty 2

## 2020-11-07 MED ORDER — DIPHENHYDRAMINE HCL 25 MG PO CAPS
25.0000 mg | ORAL_CAPSULE | Freq: Once | ORAL | Status: AC
  Administered 2020-11-07: 25 mg via ORAL
  Filled 2020-11-07: qty 1

## 2020-11-07 MED ORDER — SODIUM CHLORIDE 0.9 % IV SOLN
10.0000 mg | Freq: Once | INTRAVENOUS | Status: AC
  Administered 2020-11-07: 10 mg via INTRAVENOUS
  Filled 2020-11-07: qty 10

## 2020-11-07 MED ORDER — SODIUM CHLORIDE 0.9% FLUSH: 10.0000 mL | INTRAVENOUS | Status: DC | PRN

## 2020-11-07 MED ORDER — SODIUM CHLORIDE 0.9 % IV SOLN
65.0000 mg/m2 | Freq: Once | INTRAVENOUS | Status: AC
  Administered 2020-11-07: 110 mg via INTRAVENOUS
  Filled 2020-11-07: qty 11

## 2020-11-07 MED ORDER — TRASTUZUMAB-DKST CHEMO 150 MG IV SOLR
6.0000 mg/kg | Freq: Once | INTRAVENOUS | Status: AC
  Administered 2020-11-07: 378 mg via INTRAVENOUS
  Filled 2020-11-07: qty 18

## 2020-11-07 NOTE — Progress Notes (Signed)
Nutrition Follow-up:  Patient receiving palliative chemotherapy with Taxotere Herceptin Perjeta.  Met with patient during infusion. She reports tolerating last chemotherapy well. Patient reports more frequent loose stools, but denies diarrhea, had some nausea without vomiting. Patient reports her appetite is good and eating 6 small meals day most every day. Yesterday she did not eat as much, says she slept late and had CT scan. Patient had chocolate boost, biscuit/gravy, bacon and banana for breakfast, sandwich combo from [employer] and snacked a little. Patient reports increased acid reflux.   Medications: reviewed  Labs: reviewed  Anthropometrics: Weight 132 lb 1.6 oz today increased from 128 lb 14.4 oz on 8/31 and 122 lb on 8/17   NUTRITION DIAGNOSIS: Unintentional weight loss stable   INTERVENTION:  Continue eating smaller meals and snacks that are high in calories and protein for weight maintenance - pt has handout with recipe ideas Continue drinking 2 Boost Plus/equivalent daily - coupons provided Continue drinking Enterade daily with treatment Discussed strategies for acid reflux and foods to avoid Patient has contact information     MONITORING, EVALUATION, GOAL: weight trends, intake   NEXT VISIT: To be scheduled as needed with treatment

## 2020-11-07 NOTE — Patient Instructions (Signed)
[facility]COL[facility]  Discharge Instructions: Thank you for choosing [facility] to provide your oncology and hematology care.   If you have a lab appointment with the [facility], please go directly to the [facility] and check in at the registration area.   Wear comfortable clothing and clothing appropriate for easy access to any Portacath or PICC line.   We strive to give you quality time with your provider. You may need to reschedule your appointment if you arrive late (15 or more minutes).  Arriving late affects you and other patients whose appointments are after yours.  Also, if you miss three or more appointments without notifying the office, you may be dismissed from the clinic at the provider's discretion.      For prescription refill requests, have your pharmacy contact our office and allow 72 hours for refills to be completed.    Today you received the following chemotherapy and/or immunotherapy agents Trastuzumab, perjeta and docetaxel      To help prevent nausea and vomiting after your treatment, we encourage you to take your nausea medication as directed.  BELOW ARE SYMPTOMS THAT SHOULD BE REPORTED IMMEDIATELY: *FEVER GREATER THAN 100.4 F (38 C) OR HIGHER *CHILLS OR SWEATING *NAUSEA AND VOMITING THAT IS NOT CONTROLLED WITH YOUR NAUSEA MEDICATION *UNUSUAL SHORTNESS OF BREATH *UNUSUAL BRUISING OR BLEEDING *URINARY PROBLEMS (pain or burning when urinating, or frequent urination) *BOWEL PROBLEMS (unusual diarrhea, constipation, pain near the anus) TENDERNESS IN MOUTH AND THROAT WITH OR WITHOUT PRESENCE OF ULCERS (sore throat, sores in mouth, or a toothache) UNUSUAL RASH, SWELLING OR PAIN  UNUSUAL VAGINAL DISCHARGE OR ITCHING   Items with * indicate a potential emergency and should be followed up as soon as possible or go to the Emergency Department if any problems should occur.  Please show the CHEMOTHERAPY ALERT CARD or IMMUNOTHERAPY  ALERT CARD at check-in to the Emergency Department and triage nurse.  Should you have questions after your visit or need to cancel or reschedule your appointment, please contact [facility]  Dept: [phone]  and follow the prompts.  Office hours are 8:00 a.m. to 4:30 p.m. Monday - Friday. Please note that voicemails left after 4:00 p.m. may not be returned until the following business day.  We are closed weekends and major holidays. You have access to a nurse at all times for urgent questions. Please call the main number to the clinic Dept: [phone] and follow the prompts.   For any non-urgent questions, you may also contact your provider using MyChart. We now offer e-Visits for anyone 28 and older to request care online for non-urgent symptoms. For details visit [email or web address].   Also download the MyChart app! Go to the app store, search "MyChart", open the app, select [facility], and log in with your MyChart username and password.  Due to Covid, a mask is required upon entering the hospital/clinic. If you do not have a mask, one will be given to you upon arrival. For doctor visits, patients may have 1 support person aged 40 or older with them. For treatment visits, patients cannot have anyone with them due to current Covid guidelines and our immunocompromised population.

## 2020-11-09 ENCOUNTER — Ambulatory Visit: Payer: Self-pay

## 2020-11-14 ENCOUNTER — Encounter: Payer: Self-pay | Admitting: *Deleted

## 2020-11-15 ENCOUNTER — Other Ambulatory Visit (HOSPITAL_COMMUNITY): Payer: Self-pay

## 2020-11-19 ENCOUNTER — Other Ambulatory Visit (HOSPITAL_COMMUNITY): Payer: Self-pay

## 2020-11-21 ENCOUNTER — Other Ambulatory Visit: Payer: Self-pay

## 2020-11-21 ENCOUNTER — Inpatient Hospital Stay: Payer: Medicaid Other | Attending: Adult Health

## 2020-11-21 VITALS — BP 121/92 | HR 100 | Temp 98.9°F | Resp 16

## 2020-11-21 DIAGNOSIS — Z79899 Other long term (current) drug therapy: Secondary | ICD-10-CM | POA: Diagnosis not present

## 2020-11-21 DIAGNOSIS — C50919 Malignant neoplasm of unspecified site of unspecified female breast: Secondary | ICD-10-CM

## 2020-11-21 DIAGNOSIS — C50112 Malignant neoplasm of central portion of left female breast: Secondary | ICD-10-CM | POA: Diagnosis not present

## 2020-11-21 DIAGNOSIS — Z923 Personal history of irradiation: Secondary | ICD-10-CM | POA: Insufficient documentation

## 2020-11-21 DIAGNOSIS — Z170 Estrogen receptor positive status [ER+]: Secondary | ICD-10-CM | POA: Insufficient documentation

## 2020-11-21 DIAGNOSIS — Z5111 Encounter for antineoplastic chemotherapy: Secondary | ICD-10-CM | POA: Insufficient documentation

## 2020-11-21 DIAGNOSIS — C7951 Secondary malignant neoplasm of bone: Secondary | ICD-10-CM | POA: Insufficient documentation

## 2020-11-21 MED ORDER — GOSERELIN ACETATE 3.6 MG ~~LOC~~ IMPL
3.6000 mg | DRUG_IMPLANT | Freq: Once | SUBCUTANEOUS | Status: AC
  Administered 2020-11-21: 3.6 mg via SUBCUTANEOUS
  Filled 2020-11-21: qty 3.6

## 2020-11-21 NOTE — Patient Instructions (Signed)
Goserelin injection What is this medication? GOSERELIN (GOE se rel in) is similar to a hormone found in the body. It lowers the amount of sex hormones that the body makes. Men will have lower testosterone levels and women will have lower estrogen levels while taking this medicine. In men, this medicine is used to treat prostate cancer; the injection is either given once per month or once every 12 weeks. A once per month injection (only) is used to treat women with endometriosis, dysfunctional uterine bleeding, or advanced breast cancer. This medicine may be used for other purposes; ask your health care provider or pharmacist if you have questions. COMMON BRAND NAME(S): Zoladex What should I tell my care team before I take this medication? They need to know if you have any of these conditions: bone problems diabetes heart disease history of irregular heartbeat an unusual or allergic reaction to goserelin, other medicines, foods, dyes, or preservatives pregnant or trying to get pregnant breast-feeding How should I use this medication? This medicine is for injection under the skin. It is given by a health care professional in a hospital or clinic setting. Talk to your pediatrician regarding the use of this medicine in children. Special care may be needed. Overdosage: If you think you have taken too much of this medicine contact a poison control center or emergency room at once. NOTE: This medicine is only for you. Do not share this medicine with others. What if I miss a dose? It is important not to miss your dose. Call your doctor or health care professional if you are unable to keep an appointment. What may interact with this medication? Do not take this medicine with any of the following medications: cisapride dronedarone pimozide thioridazine This medicine may also interact with the following medications: other medicines that prolong the QT interval (an abnormal heart rhythm) This list  may not describe all possible interactions. Give your health care provider a list of all the medicines, herbs, non-prescription drugs, or dietary supplements you use. Also tell them if you smoke, drink alcohol, or use illegal drugs. Some items may interact with your medicine. What should I watch for while using this medication? Visit your doctor or health care provider for regular checks on your progress. Your symptoms may appear to get worse during the first weeks of this therapy. Tell your doctor or healthcare provider if your symptoms do not start to get better or if they get worse after this time. Your bones may get weaker if you take this medicine for a long time. If you smoke or frequently drink alcohol you may increase your risk of bone loss. A family history of osteoporosis, chronic use of drugs for seizures (convulsions), or corticosteroids can also increase your risk of bone loss. Talk to your doctor about how to keep your bones strong. This medicine should stop regular monthly menstruation in women. Tell your doctor if you continue to menstruate. Women should not become pregnant while taking this medicine or for 12 weeks after stopping this medicine. Women should inform their doctor if they wish to become pregnant or think they might be pregnant. There is a potential for serious side effects to an unborn child. Talk to your health care professional or pharmacist for more information. Do not breast-feed an infant while taking this medicine. Men should inform their doctors if they wish to father a child. This medicine may lower sperm counts. Talk to your health care professional or pharmacist for more information. This medicine may   increase blood sugar. Ask your healthcare provider if changes in diet or medicines are needed if you have diabetes. What side effects may I notice from receiving this medication? Side effects that you should report to your doctor or health care professional as soon as  possible: allergic reactions like skin rash, itching or hives, swelling of the face, lips, or tongue bone pain breathing problems changes in vision chest pain feeling faint or lightheaded, falls fever, chills pain, swelling, warmth in the leg pain, tingling, numbness in the hands or feet signs and symptoms of high blood sugar such as being more thirsty or hungry or having to urinate more than normal. You may also feel very tired or have blurry vision signs and symptoms of low blood pressure like dizziness; feeling faint or lightheaded, falls; unusually weak or tired stomach pain swelling of the ankles, feet, hands trouble passing urine or change in the amount of urine unusually high or low blood pressure unusually weak or tired Side effects that usually do not require medical attention (report to your doctor or health care professional if they continue or are bothersome): change in sex drive or performance changes in breast size in both males and females changes in emotions or moods headache hot flashes irritation at site where injected loss of appetite skin problems like acne, dry skin vaginal dryness This list may not describe all possible side effects. Call your doctor for medical advice about side effects. You may report side effects to FDA at 1-800-FDA-1088. Where should I keep my medication? This drug is given in a hospital or clinic and will not be stored at home. NOTE: This sheet is a summary. It may not cover all possible information. If you have questions about this medicine, talk to your doctor, pharmacist, or health care provider.  2022 Elsevier/Gold Standard (2018-05-24 14:05:56)  

## 2020-11-27 MED FILL — Dexamethasone Sodium Phosphate Inj 100 MG/10ML: INTRAMUSCULAR | Qty: 1 | Status: AC

## 2020-11-27 NOTE — Progress Notes (Signed)
Patient Care Team: Pcp, No as PCP - General [name], MD as Consulting Physician (Hematology and Oncology) [name], [name], NP as Nurse Practitioner (Hematology and Oncology) [name], MD as Consulting Physician (General Surgery) Mauro Kaufmann, RN as Oncology Nurse Navigator Rockwell Germany, RN as Oncology Nurse Navigator  DIAGNOSIS:    ICD-10-CM   1. Metastatic breast cancer ([place])  C50.919 CT CHEST ABDOMEN PELVIS W CONTRAST      SUMMARY OF ONCOLOGIC HISTORY: Oncology History  Metastatic breast cancer ([place])  08/15/2020 Imaging   Large mass in the medial left breast measuring 3.5 cm.  Prominent left axillary lymph node cluster of adjacent lymph nodes, irregular nodule right middle lobe 1.1 cm, aggressive lesion right second rib cortical destruction, multiple lucent lesions throughout the thoracic spine including T1 vertebral body and T2, irregular multilobar lesion central left hepatic lobe 4.3 x 4.7 cm.  Several satellite lesions in the left lateral hepatic lobe.  Multiple sclerotic lesions in the bones iliac wings, acetabular, medial iliac bones, sacrum multiple lesions lumbar spine L3-L4 and L5   08/17/2020 Initial Diagnosis   Biopsy revealed IDC with DCIS grade 2, ER 30%, PR 20%, HER2 equivocal by IHC, FISH positive, Ki-67 25%, lymph node positive    Genetic Testing   Negative genetic testing:  No pathogenic variants detected on the [employer] CancerNext-Expanded + RNAinsight panel. The report date is 09/06/2020.   The CancerNext-Expanded + RNAinsight gene panel offered by [employer] and includes sequencing and rearrangement analysis for the following 77 genes: AIP, ALK, APC, ATM, AXIN2, BAP1, BARD1, BLM, BMPR1A, BRCA1, BRCA2, BRIP1, CDC73, CDH1, CDK4, CDKN1B, CDKN2A, CHEK2, CTNNA1, DICER1, FANCC, FH, FLCN, GALNT12, KIF1B, LZTR1, MAX, MEN1, MET, MLH1, MSH2, MSH3, MSH6, MUTYH, NBN, NF1, NF2, NTHL1, PALB2, PHOX2B, PMS2, POT1, PRKAR1A, PTCH1, PTEN, RAD51C, RAD51D, RB1,  RECQL, RET, SDHA, SDHAF2, SDHB, SDHC, SDHD, SMAD4, SMARCA4, SMARCB1, SMARCE1, STK11, SUFU, TMEM127, TP53, TSC1, TSC2, VHL and XRCC2 (sequencing and deletion/duplication); EGFR, EGLN1, HOXB13, KIT, MITF, PDGFRA, POLD1 and POLE (sequencing only); EPCAM and GREM1 (deletion/duplication only). RNA data is routinely analyzed for use in variant interpretation for all genes.   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 The report date is 09/06/2020.   The CancerNext-Expanded + RNAinsight gene panel offered by [employer] and includes sequencing and rearrangement analysis for the following 77 genes: AIP, ALK, APC, ATM, AXIN2, BAP1, BARD1, BLM, BMPR1A, BRCA1, BRCA2, BRIP1, CDC73, CDH1, CDK4, CDKN1B, CDKN2A, CHEK2, CTNNA1, DICER1, FANCC, FH, FLCN, GALNT12, KIF1B, LZTR1, MAX, MEN1, MET, MLH1, MSH2, MSH3, MSH6, MUTYH, NBN, NF1, NF2, NTHL1, PALB2, PHOX2B, PMS2, POT1, PRKAR1A, PTCH1, PTEN, RAD51C, RAD51D, RB1, RECQL, RET, SDHA, SDHAF2, SDHB, SDHC, SDHD, SMAD4, SMARCA4, SMARCB1, SMARCE1, STK11, SUFU, TMEM127, TP53, TSC1, TSC2, VHL and XRCC2 (sequencing and deletion/duplication); EGFR, EGLN1, HOXB13, KIT, MITF, PDGFRA, POLD1 and POLE (sequencing only); EPCAM and GREM1 (deletion/duplication only). RNA data is routinely analyzed for use in variant interpretation for all genes.   08/18/2020 - 08/30/2020 Radiation Therapy   Palliative radiation to the lumbar and pelvic bone; 30 Gy in 10 fractions   08/31/2020 -  Adjuvant Chemotherapy   Started with Herceptin/Perjeta on 08/31/2020 and Taxotere on 09/04/2020; she was hospitalized with dehydration and refractory diarrhea for 11 days, she resumed treatment  with Taxotere (dose reduced by 30m/m2) and Herceptin only  (perjeta discontinued) on 09/26/2020, reintroduced 10/17/2020   09/03/2020 Cancer Staging   Staging form: Breast, AJCC 8th Edition - Clinical stage from 09/03/2020: Stage IV (cT4b, cN1, cM1, G2, ER+, PR+, HER2+) - Signed by CGardenia Phlegm NP on 10/03/2020 Stage prefix: Initial diagnosis Method of lymph node assessment: Clinical Histologic grading system: 3 grade system     CHIEF COMPLIANT: Cycle 5 Taxotere Herceptin and Perjeta  INTERVAL HISTORY: Ellen [name] a 25y.o. with above-mentioned history of metastatic left breast cancer, currently on chemotherapy with Taxotere Herceptin Perjeta. She reports to the clinic today for treatment.  She has tolerated last cycle of chemotherapy extremely well.  Did not have any nausea or vomiting did not have any diarrhea.  Does not have any neuropathy.  Her emotional issues also much better with her depression being much better controlled.  ALLERGIES:  is allergic to other.  MEDICATIONS:  Current Outpatient Medications  Medication Sig Dispense Refill   METROGEL 1 % gel Apply topically daily. 45 g 0   acyclovir (ZOVIRAX) 800 MG tablet Take 1 tablet (800 mg total) by mouth 2 (two) times daily as needed (flare up). 60 tablet 5   acyclovir ointment (ZOVIRAX) 5 % Apply 1 application topically 4 (four) times daily as needed. outbreak     cholestyramine (QUESTRAN) 4 g packet Take 1 packet mixed with liquid by mouth 2 times daily as needed. 60 each 12   lidocaine-prilocaine (EMLA) cream Apply to affected area once 30 g 3   LORazepam (ATIVAN) 0.5 MG tablet Take 1 tablet (0.5 mg total) by mouth at bedtime as needed for sleep. 30 tablet 0   Multiple Vitamin (MULTIVITAMINS PO) Take 1 tablet by mouth daily.     ondansetron (ZOFRAN) 8 MG tablet Take 1 tablet (8 mg total) by mouth 2 (two) times daily as needed for refractory nausea / vomiting. 30 tablet 1   oxyCODONE-acetaminophen (PERCOCET/ROXICET) 5-325 MG tablet Take 1 tablet by mouth every 8  (eight) hours as needed for severe pain. 60 tablet 0   prochlorperazine (COMPAZINE) 10 MG tablet Take 1 tablet (10 mg total) by mouth every 6 (six) hours as needed for nausea or vomiting. 30 tablet 1   venlafaxine XR (EFFEXOR-XR) 150 MG 24 hr capsule Take 1 capsule (150 mg total) by mouth daily with breakfast. 30 capsule 6   No current facility-administered medications for this visit.    PHYSICAL EXAMINATION: ECOG PERFORMANCE STATUS: 1 - Symptomatic but completely ambulatory  Vitals:   11/28/20 1022  BP: 117/73  Pulse: (!) 108  Resp: 18  Temp: 97.7 F (36.5 C)  SpO2: 99%   Filed Weights   11/28/20 1022  Weight: 133 lb 8 oz (60.6 kg)     LABORATORY DATA:  I have reviewed the data as listed CMP Latest Ref Rng & Units 11/07/2020 10/17/2020 10/03/2020  Glucose 70 - 99 mg/dL 90 106(H) 101(H)  BUN 6 - 20 mg/dL 7 12 <4(L)  Creatinine 0.44 - 1.00 mg/dL 0.65 0.59 0.66  Sodium 135 - 145 mmol/L 141 139 141  Potassium 3.5 - 5.1 mmol/L 4.2 4.7 3.5  Chloride 98 - 111 mmol/L 109 107 105  CO2 22 - 32 mmol/L _0 Calcium 8.9 - 10.3 mg/dL 9.0 9.1 9.2  Total Protein 6.5 - 8.1 g/dL 6.2(L) 6.2(L) 6.4(L)  Total Bilirubin 0.3 - 1.2 mg/dL 0.3 0.4 0.9  Alkaline Phos 38 - 126 U/L 68 102  146(H)  AST 15 - 41 U/L 12(L) 21 19  ALT 0 - 44 U/L _0 Lab Results  Component Value Date   WBC 7.5 11/28/2020   HGB 11.7 (L) 11/28/2020   HCT 35.8 (L) 11/28/2020   MCV 87.5 11/28/2020   PLT 285 11/28/2020   NEUTROABS 5.7 11/28/2020    ASSESSMENT & PLAN:  Metastatic breast cancer ([place]) 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, FISH positive ratio 2.8, Ki-67 25%, lymph node positive   Palliative radiation completed 09/03/2020   Treatment plan: Palliative chemotherapy with Taxotere Herceptin Perjeta every 3 weeks x6  cycles followed by Herceptin Perjeta maintenance  ------------------------------------------------------------------------------------------------------------------------------------ Current treatment: Cycle 5 Taxotere Herceptin Perjeta Toxicities: Hospitalization 7/24-09/19/20: grade III-IV Diarrhea  Diarrhea much improved, now grade 1.  I recommended continued fluid and food intake.   Depression, no suicidal thoughts, if not feeling better in a week will take two of her effexor tablets.   Weight loss: She has gained some weight and her albumin levels have improved.     CT CAP 11/06/2020:   remarkable improvement in the liver metastases as well as lung metastases.  The bone metastases also appears to be better.      Plan is to complete a total of 6 cycles and after that she will go on maintenance of Herceptin and Perjeta if the scans look great. Is set up for the CT scans to be done after cycle 6 of treatment.    Orders Placed This Encounter  Procedures   CT CHEST ABDOMEN PELVIS W CONTRAST    Standing Status:   Future    Standing Expiration Date:   11/28/2021    Order Specific Question:   Is patient pregnant?    Answer:   No    Order Specific Question:   Preferred imaging location?    Answer:   [facility]    Order Specific Question:   Release to patient    Answer:   Immediate    Order Specific Question:   Is Oral Contrast requested for this exam?    Answer:   Yes, Per Radiology protocol    Order Specific Question:   Reason for Exam (SYMPTOM  OR DIAGNOSIS REQUIRED)    Answer:   Met breast cancer restaging   The patient has a good understanding of the overall plan. she agrees with it. she will call with any problems that may develop before the next visit here.  Total time spent: 30 mins including face to face time and time spent for planning, charting and coordination of care  Rulon Eisenmenger, MD, MPH 11/28/2020  I, Thana Ates, am acting as scribe for Dr. [name].  I have reviewed the above documentation for accuracy and completeness, and I agree with the above.

## 2020-11-28 ENCOUNTER — Other Ambulatory Visit: Payer: Self-pay

## 2020-11-28 ENCOUNTER — Inpatient Hospital Stay: Payer: Medicaid Other

## 2020-11-28 ENCOUNTER — Inpatient Hospital Stay (HOSPITAL_BASED_OUTPATIENT_CLINIC_OR_DEPARTMENT_OTHER): Payer: Medicaid Other | Admitting: Hematology and Oncology

## 2020-11-28 ENCOUNTER — Encounter: Payer: Self-pay | Admitting: General Practice

## 2020-11-28 VITALS — HR 92

## 2020-11-28 DIAGNOSIS — C50912 Malignant neoplasm of unspecified site of left female breast: Secondary | ICD-10-CM

## 2020-11-28 DIAGNOSIS — C50919 Malignant neoplasm of unspecified site of unspecified female breast: Secondary | ICD-10-CM

## 2020-11-28 DIAGNOSIS — Z5111 Encounter for antineoplastic chemotherapy: Secondary | ICD-10-CM | POA: Diagnosis not present

## 2020-11-28 DIAGNOSIS — Z95828 Presence of other vascular implants and grafts: Secondary | ICD-10-CM

## 2020-11-28 LAB — CMP (CANCER CENTER ONLY)
ALT: 13 U/L (ref 0–44)
AST: 21 U/L (ref 15–41)
Albumin: 3.8 g/dL (ref 3.5–5.0)
Alkaline Phosphatase: 60 U/L (ref 38–126)
Anion gap: 7 (ref 5–15)
BUN: 10 mg/dL (ref 6–20)
CO2: 24 mmol/L (ref 22–32)
Calcium: 8.8 mg/dL — ABNORMAL LOW (ref 8.9–10.3)
Chloride: 105 mmol/L (ref 98–111)
Creatinine: 0.7 mg/dL (ref 0.44–1.00)
GFR, Estimated: >60 mL/min (ref >60)
Glucose, Bld: 104 mg/dL — ABNORMAL HIGH (ref 70–99)
Potassium: 4.2 mmol/L (ref 3.5–5.1)
Sodium: 136 mmol/L (ref 135–145)
Total Bilirubin: 0.5 mg/dL (ref 0.3–1.2)
Total Protein: 6.5 g/dL (ref 6.5–8.1)

## 2020-11-28 LAB — CBC WITH DIFFERENTIAL (CANCER CENTER ONLY)
Abs Immature Granulocytes: 0.05 10*3/uL (ref 0.00–0.07)
Basophils Absolute: 0 10*3/uL (ref 0.0–0.1)
Basophils Relative: 0 %
Eosinophils Absolute: 0 10*3/uL (ref 0.0–0.5)
Eosinophils Relative: 0 %
HCT: 35.8 % — ABNORMAL LOW (ref 36.0–46.0)
Hemoglobin: 11.7 g/dL — ABNORMAL LOW (ref 12.0–15.0)
Immature Granulocytes: 1 %
Lymphocytes Relative: 16 %
Lymphs Abs: 1.2 10*3/uL (ref 0.7–4.0)
MCH: 28.6 pg (ref 26.0–34.0)
MCHC: 32.7 g/dL (ref 30.0–36.0)
MCV: 87.5 fL (ref 80.0–100.0)
Monocytes Absolute: 0.5 10*3/uL (ref 0.1–1.0)
Monocytes Relative: 7 %
Neutro Abs: 5.7 10*3/uL (ref 1.7–7.7)
Neutrophils Relative %: 76 %
Platelet Count: 285 10*3/uL (ref 150–400)
RBC: 4.09 MIL/uL (ref 3.87–5.11)
RDW: 15.4 % (ref 11.5–15.5)
WBC Count: 7.5 10*3/uL (ref 4.0–10.5)
nRBC: 0 % (ref 0.0–0.2)

## 2020-11-28 MED ORDER — SODIUM CHLORIDE 0.9 % IV SOLN
420.0000 mg | Freq: Once | INTRAVENOUS | Status: AC
  Administered 2020-11-28: 420 mg via INTRAVENOUS
  Filled 2020-11-28: qty 14

## 2020-11-28 MED ORDER — ACETAMINOPHEN 325 MG PO TABS
650.0000 mg | ORAL_TABLET | Freq: Once | ORAL | Status: AC
  Administered 2020-11-28: 650 mg via ORAL
  Filled 2020-11-28: qty 2

## 2020-11-28 MED ORDER — HEPARIN SOD (PORK) LOCK FLUSH 100 UNIT/ML IV SOLN
500.0000 [IU] | Freq: Once | INTRAVENOUS | Status: AC | PRN
  Administered 2020-11-28: 500 [IU]

## 2020-11-28 MED ORDER — SODIUM CHLORIDE 0.9 % IV SOLN
10.0000 mg | Freq: Once | INTRAVENOUS | Status: AC
  Administered 2020-11-28: 10 mg via INTRAVENOUS
  Filled 2020-11-28: qty 10

## 2020-11-28 MED ORDER — SODIUM CHLORIDE 0.9% FLUSH
10.0000 mL | INTRAVENOUS | Status: DC | PRN
  Administered 2020-11-28: 10 mL

## 2020-11-28 MED ORDER — TRASTUZUMAB-DKST CHEMO 150 MG IV SOLR
6.0000 mg/kg | Freq: Once | INTRAVENOUS | Status: AC
  Administered 2020-11-28: 378 mg via INTRAVENOUS
  Filled 2020-11-28: qty 18

## 2020-11-28 MED ORDER — SODIUM CHLORIDE 0.9 % IV SOLN: Freq: Once | INTRAVENOUS | Status: AC

## 2020-11-28 MED ORDER — SODIUM CHLORIDE 0.9 % IV SOLN
65.0000 mg/m2 | Freq: Once | INTRAVENOUS | Status: AC
  Administered 2020-11-28: 110 mg via INTRAVENOUS
  Filled 2020-11-28: qty 11

## 2020-11-28 MED ORDER — DIPHENHYDRAMINE HCL 25 MG PO CAPS
25.0000 mg | ORAL_CAPSULE | Freq: Once | ORAL | Status: AC
  Administered 2020-11-28: 25 mg via ORAL
  Filled 2020-11-28: qty 1

## 2020-11-28 MED ORDER — SODIUM CHLORIDE 0.9% FLUSH
10.0000 mL | Freq: Once | INTRAVENOUS | Status: AC
  Administered 2020-11-28: 10 mL via INTRAVENOUS

## 2020-11-28 NOTE — Progress Notes (Signed)
[place] Note  Ellen [name] requested chaplain visit in infusion today because she brought gifts to share with other patients--her mom has made many amazing journals (and her grandma, breast cancer support necklaces)! We also did a squat challenge together to support her breast cancer awareness fundraising program that she is doing for the month of October. Ellen [name] is looking forward to visiting her older sister and new baby in [place] after she finishes chemo.  Provided pastoral presence and empathic listening. We plan to follow up at a future treatment, and I will write thank-you notes to her family.   [place], [place], [facility] Pager [phone] Voicemail [phone]

## 2020-11-28 NOTE — Patient Instructions (Signed)
[facility] ONCOLOGY  Discharge Instructions: Thank you for choosing [facility] to provide your oncology and hematology care.   If you have a lab appointment with the [facility], please go directly to the [facility] and check in at the registration area.   Wear comfortable clothing and clothing appropriate for easy access to any Portacath or PICC line.   We strive to give you quality time with your provider. You may need to reschedule your appointment if you arrive late (15 or more minutes).  Arriving late affects you and other patients whose appointments are after yours.  Also, if you miss three or more appointments without notifying the office, you may be dismissed from the clinic at the provider's discretion.      For prescription refill requests, have your pharmacy contact our office and allow 72 hours for refills to be completed.    Today you received the following chemotherapy and/or immunotherapy agents Pertuzumab, Trastuzumab-dkst, and Docetaxel      To help prevent nausea and vomiting after your treatment, we encourage you to take your nausea medication as directed.  BELOW ARE SYMPTOMS THAT SHOULD BE REPORTED IMMEDIATELY: *FEVER GREATER THAN 100.4 F (38 C) OR HIGHER *CHILLS OR SWEATING *NAUSEA AND VOMITING THAT IS NOT CONTROLLED WITH YOUR NAUSEA MEDICATION *UNUSUAL SHORTNESS OF BREATH *UNUSUAL BRUISING OR BLEEDING *URINARY PROBLEMS (pain or burning when urinating, or frequent urination) *BOWEL PROBLEMS (unusual diarrhea, constipation, pain near the anus) TENDERNESS IN MOUTH AND THROAT WITH OR WITHOUT PRESENCE OF ULCERS (sore throat, sores in mouth, or a toothache) UNUSUAL RASH, SWELLING OR PAIN  UNUSUAL VAGINAL DISCHARGE OR ITCHING   Items with * indicate a potential emergency and should be followed up as soon as possible or go to the Emergency Department if any problems should occur.  Please show the CHEMOTHERAPY ALERT CARD or  IMMUNOTHERAPY ALERT CARD at check-in to the Emergency Department and triage nurse.  Should you have questions after your visit or need to cancel or reschedule your appointment, please contact [facility]  Dept: [phone]  and follow the prompts.  Office hours are 8:00 a.m. to 4:30 p.m. Monday - Friday. Please note that voicemails left after 4:00 p.m. may not be returned until the following business day.  We are closed weekends and major holidays. You have access to a nurse at all times for urgent questions. Please call the main number to the clinic Dept: [phone] and follow the prompts.   For any non-urgent questions, you may also contact your provider using MyChart. We now offer e-Visits for anyone 24 and older to request care online for non-urgent symptoms. For details visit [email or web address].   Also download the MyChart app! Go to the app store, search "MyChart", open the app, select [facility], and log in with your MyChart username and password.  Due to Covid, a mask is required upon entering the hospital/clinic. If you do not have a mask, one will be given to you upon arrival. For doctor visits, patients may have 1 support person aged 26 or older with them. For treatment visits, patients cannot have anyone with them due to current Covid guidelines and our immunocompromised population.

## 2020-11-28 NOTE — Assessment & Plan Note (Signed)
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, ER 30%, PR 20%, HER2 equivocal by IHC, FISH positiveratio 2.8, Ki-67 25%, lymph node positive  Palliative radiation completed 09/03/2020  Treatment plan: Palliative chemotherapy with Taxotere Herceptin Perjeta every 3 weeks x6 cycles followed by Herceptin Perjeta maintenance  ------------------------------------------------------------------------------------------------------------------------------------ Current treatment: Cycle6TaxotereHerceptin Perjeta Toxicities: Hospitalization 7/24-09/19/20:grade III-IVDiarrhea  Diarrhea much improved, now grade 1. I recommended continued fluid and food intake.  Depression, no suicidal thoughts, if not feeling better in a week will take two of her effexor tablets.  Weight loss:She has gained some weight and her albumin levels have improved.  CT CAP 11/06/2020: To my evaluation there appears to be remarkable improvement in the liver metastases as well as lung metastases.  The bone metastases also appears to be better.  Official report is not yet available.  Continue with current treatment for total of 6 cycles and after that she will go on maintenance of Herceptin and Perjeta.

## 2020-12-17 MED FILL — Dexamethasone Sodium Phosphate Inj 100 MG/10ML: INTRAMUSCULAR | Qty: 1 | Status: AC

## 2020-12-17 NOTE — Assessment & Plan Note (Deleted)
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, ER 30%, PR 20%, HER2 equivocal by IHC, FISH positiveratio 2.8, Ki-67 25%, lymph node positive  Palliative radiation completed 09/03/2020  Treatment plan: Palliative chemotherapy with Taxotere Herceptin Perjeta every 3 weeks x6 cycles followed by Herceptin Perjeta maintenance  ------------------------------------------------------------------------------------------------------------------------------------ Current treatment: Cycle6TaxotereHerceptin Perjeta (completed 07/29/20),  Currently on Herceptin and Perjeta surveillance  Toxicities: Hospitalization 7/24-09/19/20:grade III-IVDiarrhea  Diarrhea much improved, now grade 1. I recommended continued fluid and food intake.  Depression, no suicidal thoughts, if not feeling better in a week will take two of her effexor tablets.  Weight loss:She has gained some weight and her albumin levels have improved.  CT CAP 11/06/2020:  remarkable improvement in the liver metastases as well as lung metastases. The bone metastases also appears to be better.

## 2020-12-17 NOTE — Progress Notes (Incomplete)
Patient Care Team: Pcp, No as PCP - General [name], MD as Consulting Physician (Hematology and Oncology) [name], [name], NP as Nurse Practitioner (Hematology and Oncology) [name], MD as Consulting Physician (General Surgery) Mauro Kaufmann, RN as Oncology Nurse Navigator Rockwell Germany, RN as Oncology Nurse Navigator  DIAGNOSIS: No diagnosis found.  SUMMARY OF ONCOLOGIC HISTORY: Oncology History  Metastatic breast cancer ([place])  08/15/2020 Imaging   Large mass in the medial left breast measuring 3.5 cm.  Prominent left axillary lymph node cluster of adjacent lymph nodes, irregular nodule right middle lobe 1.1 cm, aggressive lesion right second rib cortical destruction, multiple lucent lesions throughout the thoracic spine including T1 vertebral body and T2, irregular multilobar lesion central left hepatic lobe 4.3 x 4.7 cm.  Several satellite lesions in the left lateral hepatic lobe.  Multiple sclerotic lesions in the bones iliac wings, acetabular, medial iliac bones, sacrum multiple lesions lumbar spine L3-L4 and L5   08/17/2020 Initial Diagnosis   Biopsy revealed IDC with DCIS grade 2, ER 30%, PR 20%, HER2 equivocal by IHC, FISH positive, Ki-67 25%, lymph node positive    Genetic Testing   Negative genetic testing:  No pathogenic variants detected on the [facility] CancerNext-Expanded + RNAinsight panel. The report date is 09/06/2020.   The CancerNext-Expanded + RNAinsight gene panel offered by [employer] and includes sequencing and rearrangement analysis for the following 77 genes: AIP, ALK, APC, ATM, AXIN2, BAP1, BARD1, BLM, BMPR1A, BRCA1, BRCA2, BRIP1, CDC73, CDH1, CDK4, CDKN1B, CDKN2A, CHEK2, CTNNA1, DICER1, FANCC, FH, FLCN, GALNT12, KIF1B, LZTR1, MAX, MEN1, MET, MLH1, MSH2, MSH3, MSH6, MUTYH, NBN, NF1, NF2, NTHL1, PALB2, PHOX2B, PMS2, POT1, PRKAR1A, PTCH1, PTEN, RAD51C, RAD51D, RB1, RECQL, RET, SDHA, SDHAF2, SDHB, SDHC, SDHD, SMAD4, SMARCA4, SMARCB1, SMARCE1,  STK11, SUFU, TMEM127, TP53, TSC1, TSC2, VHL and XRCC2 (sequencing and deletion/duplication); EGFR, EGLN1, HOXB13, KIT, MITF, PDGFRA, POLD1 and POLE (sequencing only); EPCAM and GREM1 (deletion/duplication only). RNA data is routinely analyzed for use in variant interpretation for all genes.   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 The report date is 09/06/2020.   The CancerNext-Expanded + RNAinsight gene panel offered by [employer] and includes sequencing and rearrangement analysis for the following 77 genes: AIP, ALK, APC, ATM, AXIN2, BAP1, BARD1, BLM, BMPR1A, BRCA1, BRCA2, BRIP1, CDC73, CDH1, CDK4, CDKN1B, CDKN2A, CHEK2, CTNNA1, DICER1, FANCC, FH, FLCN, GALNT12, KIF1B, LZTR1, MAX, MEN1, MET, MLH1, MSH2, MSH3, MSH6, MUTYH, NBN, NF1, NF2, NTHL1, PALB2, PHOX2B, PMS2, POT1, PRKAR1A, PTCH1, PTEN, RAD51C, RAD51D, RB1, RECQL, RET, SDHA, SDHAF2, SDHB, SDHC, SDHD, SMAD4, SMARCA4, SMARCB1, SMARCE1, STK11, SUFU, TMEM127, TP53, TSC1, TSC2, VHL and XRCC2 (sequencing and deletion/duplication); EGFR, EGLN1, HOXB13, KIT, MITF, PDGFRA, POLD1 and POLE (sequencing only); EPCAM and GREM1 (deletion/duplication only). RNA data is routinely analyzed for use in variant interpretation for all genes.   08/18/2020 - 08/30/2020 Radiation Therapy   Palliative radiation to the lumbar and pelvic bone; 30 Gy in 10 fractions   08/31/2020 -  Adjuvant Chemotherapy   Started with Herceptin/Perjeta on 08/31/2020 and Taxotere on 09/04/2020; she was hospitalized with dehydration and refractory diarrhea for 11 days, she resumed treatment with Taxotere (dose reduced by 78m/m2) and Herceptin only (perjeta discontinued) on 09/26/2020, reintroduced 10/17/2020   09/03/2020 Cancer  Staging   Staging form: Breast, AJCC 8th Edition - Clinical stage from 09/03/2020: Stage IV (cT4b, cN1, cM1, G2, ER+, PR+, HER2+) - Signed by Gardenia Phlegm, NP on 10/03/2020 Stage prefix: Initial diagnosis Method of lymph node assessment: Clinical Histologic grading system: 3 grade system      CHIEF COMPLIANT: Cycle 7 Taxotere Herceptin and Perjeta  INTERVAL HISTORY: Ellen [name] is a 25 y.o. with above-mentioned history of metastatic left breast cancer, currently on chemotherapy with Taxotere Herceptin Perjeta. She reports to the clinic today for treatment.   ALLERGIES:  is allergic to other.  MEDICATIONS:  Current Outpatient Medications  Medication Sig Dispense Refill   acyclovir (ZOVIRAX) 800 MG tablet Take 1 tablet (800 mg total) by mouth 2 (two) times daily as needed (flare up). 60 tablet 5   acyclovir ointment (ZOVIRAX) 5 % Apply 1 application topically 4 (four) times daily as needed. outbreak     cholestyramine (QUESTRAN) 4 g packet Take 1 packet mixed with liquid by mouth 2 times daily as needed. 60 each 12   lidocaine-prilocaine (EMLA) cream Apply to affected area once 30 g 3   LORazepam (ATIVAN) 0.5 MG tablet Take 1 tablet (0.5 mg total) by mouth at bedtime as needed for sleep. 30 tablet 0   METROGEL 1 % gel Apply topically daily. 45 g 0   Multiple Vitamin (MULTIVITAMINS PO) Take 1 tablet by mouth daily.     ondansetron (ZOFRAN) 8 MG tablet Take 1 tablet (8 mg total) by mouth 2 (two) times daily as needed for refractory nausea / vomiting. 30 tablet 1   oxyCODONE-acetaminophen (PERCOCET/ROXICET) 5-325 MG tablet Take 1 tablet by mouth every 8 (eight) hours as needed for severe pain. 60 tablet 0   prochlorperazine (COMPAZINE) 10 MG tablet Take 1 tablet (10 mg total) by mouth every 6 (six) hours as needed for nausea or vomiting. 30 tablet 1   venlafaxine XR (EFFEXOR-XR) 150 MG 24 hr capsule Take 1 capsule (150 mg total) by mouth daily with breakfast. 30 capsule 6    No current facility-administered medications for this visit.    PHYSICAL EXAMINATION: ECOG PERFORMANCE STATUS: {CHL ONC ECOG PS:[phone]}  There were no vitals filed for this visit. There were no vitals filed for this visit.  LABORATORY DATA:  I have reviewed the data as listed CMP Latest Ref Rng & Units 11/28/2020 11/07/2020 10/17/2020  Glucose 70 - 99 mg/dL 104(H) 90 106(H)  BUN 6 - 20 mg/dL 10 7 12   Creatinine 0.44 - 1.00 mg/dL 0.70 0.65 0.59  Sodium 135 - 145 mmol/L 136 141 139  Potassium 3.5 - 5.1 mmol/L 4.2 4.2 4.7  Chloride 98 - 111 mmol/L 105 109 107  CO2 22 - 32 mmol/L 24 23 25   Calcium 8.9 - 10.3 mg/dL 8.8(L) 9.0 9.1  Total Protein 6.5 - 8.1 g/dL 6.5 6.2(L) 6.2(L)  Total Bilirubin 0.3 - 1.2 mg/dL 0.5 0.3 0.4  Alkaline Phos 38 - 126 U/L 60 68 102  AST 15 - 41 U/L 21 12(L) 21  ALT 0 - 44 U/L 13 8 18     Lab Results  Component Value Date   WBC 7.5 11/28/2020   HGB 11.7 (L) 11/28/2020   HCT 35.8 (L) 11/28/2020   MCV 87.5 11/28/2020   PLT 285 11/28/2020   NEUTROABS 5.7 11/28/2020    ASSESSMENT & PLAN:  No problem-specific Assessment & Plan notes found for this encounter.    No orders of the defined types were placed in this  encounter.  The patient has a good understanding of the overall plan. she agrees with it. she will call with any problems that may develop before the next visit here.  Total time spent: *** mins including face to face time and time spent for planning, charting and coordination of care  Rulon Eisenmenger, MD, MPH 12/17/2020  I, Thana Ates, am acting as scribe for Dr. [name].  {insert scribe attestation}

## 2020-12-18 ENCOUNTER — Other Ambulatory Visit: Payer: Self-pay

## 2020-12-18 ENCOUNTER — Inpatient Hospital Stay: Payer: Medicaid Other

## 2020-12-18 ENCOUNTER — Other Ambulatory Visit: Payer: Self-pay | Admitting: Hematology and Oncology

## 2020-12-18 ENCOUNTER — Ambulatory Visit: Payer: Medicaid Other | Admitting: Hematology and Oncology

## 2020-12-18 ENCOUNTER — Inpatient Hospital Stay: Payer: Medicaid Other | Attending: Adult Health

## 2020-12-18 ENCOUNTER — Encounter: Payer: Self-pay | Admitting: General Practice

## 2020-12-18 ENCOUNTER — Encounter: Payer: Self-pay | Admitting: *Deleted

## 2020-12-18 VITALS — BP 119/79 | HR 89 | Temp 98.3°F | Resp 17 | Wt 136.5 lb

## 2020-12-18 DIAGNOSIS — C7951 Secondary malignant neoplasm of bone: Secondary | ICD-10-CM | POA: Diagnosis not present

## 2020-12-18 DIAGNOSIS — C50912 Malignant neoplasm of unspecified site of left female breast: Secondary | ICD-10-CM

## 2020-12-18 DIAGNOSIS — C787 Secondary malignant neoplasm of liver and intrahepatic bile duct: Secondary | ICD-10-CM | POA: Diagnosis not present

## 2020-12-18 DIAGNOSIS — C50919 Malignant neoplasm of unspecified site of unspecified female breast: Secondary | ICD-10-CM | POA: Diagnosis not present

## 2020-12-18 DIAGNOSIS — R918 Other nonspecific abnormal finding of lung field: Secondary | ICD-10-CM | POA: Diagnosis not present

## 2020-12-18 DIAGNOSIS — Z5111 Encounter for antineoplastic chemotherapy: Secondary | ICD-10-CM | POA: Insufficient documentation

## 2020-12-18 DIAGNOSIS — Z95828 Presence of other vascular implants and grafts: Secondary | ICD-10-CM

## 2020-12-18 DIAGNOSIS — Z923 Personal history of irradiation: Secondary | ICD-10-CM | POA: Insufficient documentation

## 2020-12-18 LAB — CBC WITH DIFFERENTIAL (CANCER CENTER ONLY)
Abs Immature Granulocytes: 0.05 10*3/uL (ref 0.00–0.07)
Basophils Absolute: 0 10*3/uL (ref 0.0–0.1)
Basophils Relative: 0 %
Eosinophils Absolute: 0 10*3/uL (ref 0.0–0.5)
Eosinophils Relative: 0 %
HCT: 34.5 % — ABNORMAL LOW (ref 36.0–46.0)
Hemoglobin: 11.2 g/dL — ABNORMAL LOW (ref 12.0–15.0)
Immature Granulocytes: 1 %
Lymphocytes Relative: 19 %
Lymphs Abs: 1.4 10*3/uL (ref 0.7–4.0)
MCH: 28.4 pg (ref 26.0–34.0)
MCHC: 32.5 g/dL (ref 30.0–36.0)
MCV: 87.3 fL (ref 80.0–100.0)
Monocytes Absolute: 0.7 10*3/uL (ref 0.1–1.0)
Monocytes Relative: 9 %
Neutro Abs: 5.4 10*3/uL (ref 1.7–7.7)
Neutrophils Relative %: 71 %
Platelet Count: 318 10*3/uL (ref 150–400)
RBC: 3.95 MIL/uL (ref 3.87–5.11)
RDW: 15.7 % — ABNORMAL HIGH (ref 11.5–15.5)
WBC Count: 7.5 10*3/uL (ref 4.0–10.5)
nRBC: 0 % (ref 0.0–0.2)

## 2020-12-18 LAB — CMP (CANCER CENTER ONLY)
ALT: 11 U/L (ref 0–44)
AST: 13 U/L — ABNORMAL LOW (ref 15–41)
Albumin: 3.5 g/dL (ref 3.5–5.0)
Alkaline Phosphatase: 63 U/L (ref 38–126)
Anion gap: 4 — ABNORMAL LOW (ref 5–15)
BUN: 7 mg/dL (ref 6–20)
CO2: 27 mmol/L (ref 22–32)
Calcium: 8.6 mg/dL — ABNORMAL LOW (ref 8.9–10.3)
Chloride: 106 mmol/L (ref 98–111)
Creatinine: 0.72 mg/dL (ref 0.44–1.00)
GFR, Estimated: >60 mL/min (ref >60)
Glucose, Bld: 100 mg/dL — ABNORMAL HIGH (ref 70–99)
Potassium: 3.9 mmol/L (ref 3.5–5.1)
Sodium: 137 mmol/L (ref 135–145)
Total Bilirubin: <0.2 mg/dL — ABNORMAL LOW (ref 0.3–1.2)
Total Protein: 6 g/dL — ABNORMAL LOW (ref 6.5–8.1)

## 2020-12-18 MED ORDER — ACETAMINOPHEN 325 MG PO TABS
650.0000 mg | ORAL_TABLET | Freq: Once | ORAL | Status: AC
  Administered 2020-12-18: 650 mg via ORAL
  Filled 2020-12-18: qty 2

## 2020-12-18 MED ORDER — SODIUM CHLORIDE 0.9 % IV SOLN
65.0000 mg/m2 | Freq: Once | INTRAVENOUS | Status: AC
  Administered 2020-12-18: 110 mg via INTRAVENOUS
  Filled 2020-12-18: qty 11

## 2020-12-18 MED ORDER — SODIUM CHLORIDE 0.9 % IV SOLN
10.0000 mg | Freq: Once | INTRAVENOUS | Status: AC
  Administered 2020-12-18: 10 mg via INTRAVENOUS
  Filled 2020-12-18: qty 10

## 2020-12-18 MED ORDER — DIPHENHYDRAMINE HCL 25 MG PO CAPS
25.0000 mg | ORAL_CAPSULE | Freq: Once | ORAL | Status: AC
  Administered 2020-12-18: 25 mg via ORAL
  Filled 2020-12-18: qty 1

## 2020-12-18 MED ORDER — SODIUM CHLORIDE 0.9% FLUSH
10.0000 mL | INTRAVENOUS | Status: DC | PRN
Start: 2020-12-18 — End: 2020-12-18
  Administered 2020-12-18: 10 mL

## 2020-12-18 MED ORDER — GOSERELIN ACETATE 3.6 MG ~~LOC~~ IMPL
3.6000 mg | DRUG_IMPLANT | Freq: Once | SUBCUTANEOUS | Status: AC
  Administered 2020-12-18: 3.6 mg via SUBCUTANEOUS
  Filled 2020-12-18: qty 3.6

## 2020-12-18 MED ORDER — SODIUM CHLORIDE 0.9% FLUSH
10.0000 mL | INTRAVENOUS | Status: AC | PRN
  Administered 2020-12-18: 10 mL

## 2020-12-18 MED ORDER — HEPARIN SOD (PORK) LOCK FLUSH 100 UNIT/ML IV SOLN
500.0000 [IU] | Freq: Once | INTRAVENOUS | Status: AC | PRN
  Administered 2020-12-18: 500 [IU]

## 2020-12-18 MED ORDER — TRASTUZUMAB-DKST CHEMO 150 MG IV SOLR
6.0000 mg/kg | Freq: Once | INTRAVENOUS | Status: AC
  Administered 2020-12-18: 378 mg via INTRAVENOUS
  Filled 2020-12-18: qty 18

## 2020-12-18 MED ORDER — SODIUM CHLORIDE 0.9 % IV SOLN
Freq: Once | INTRAVENOUS | Status: AC
Start: 2020-12-18 — End: 2020-12-18

## 2020-12-18 MED ORDER — SODIUM CHLORIDE 0.9 % IV SOLN
420.0000 mg | Freq: Once | INTRAVENOUS | Status: AC
  Administered 2020-12-18: 420 mg via INTRAVENOUS
  Filled 2020-12-18: qty 14

## 2020-12-18 NOTE — Patient Instructions (Signed)
[facility]COL[facility]  Discharge Instructions: Thank you for choosing [facility] to provide your oncology and hematology care.   If you have a lab appointment with the [facility], please go directly to the [facility] and check in at the registration area.   Wear comfortable clothing and clothing appropriate for easy access to any Portacath or PICC line.   We strive to give you quality time with your provider. You may need to reschedule your appointment if you arrive late (15 or more minutes).  Arriving late affects you and other patients whose appointments are after yours.  Also, if you miss three or more appointments without notifying the office, you may be dismissed from the clinic at the provider's discretion.      For prescription refill requests, have your pharmacy contact our office and allow 72 hours for refills to be completed.    Today you received the following chemotherapy and/or immunotherapy agents: herceptin/perjeta/taxotere      To help prevent nausea and vomiting after your treatment, we encourage you to take your nausea medication as directed.  BELOW ARE SYMPTOMS THAT SHOULD BE REPORTED IMMEDIATELY: *FEVER GREATER THAN 100.4 F (38 C) OR HIGHER *CHILLS OR SWEATING *NAUSEA AND VOMITING THAT IS NOT CONTROLLED WITH YOUR NAUSEA MEDICATION *UNUSUAL SHORTNESS OF BREATH *UNUSUAL BRUISING OR BLEEDING *URINARY PROBLEMS (pain or burning when urinating, or frequent urination) *BOWEL PROBLEMS (unusual diarrhea, constipation, pain near the anus) TENDERNESS IN MOUTH AND THROAT WITH OR WITHOUT PRESENCE OF ULCERS (sore throat, sores in mouth, or a toothache) UNUSUAL RASH, SWELLING OR PAIN  UNUSUAL VAGINAL DISCHARGE OR ITCHING   Items with * indicate a potential emergency and should be followed up as soon as possible or go to the Emergency Department if any problems should occur.  Please show the CHEMOTHERAPY ALERT CARD or IMMUNOTHERAPY ALERT  CARD at check-in to the Emergency Department and triage nurse.  Should you have questions after your visit or need to cancel or reschedule your appointment, please contact [facility]  Dept: [phone]  and follow the prompts.  Office hours are 8:00 a.m. to 4:30 p.m. Monday - Friday. Please note that voicemails left after 4:00 p.m. may not be returned until the following business day.  We are closed weekends and major holidays. You have access to a nurse at all times for urgent questions. Please call the main number to the clinic Dept: [phone] and follow the prompts.   For any non-urgent questions, you may also contact your provider using MyChart. We now offer e-Visits for anyone 46 and older to request care online for non-urgent symptoms. For details visit [email or web address].   Also download the MyChart app! Go to the app store, search "MyChart", open the app, select [facility], and log in with your MyChart username and password.  Due to Covid, a mask is required upon entering the hospital/clinic. If you do not have a mask, one will be given to you upon arrival. For doctor visits, patients may have 1 support person aged 72 or older with them. For treatment visits, patients cannot have anyone with them due to current Covid guidelines and our immunocompromised population.

## 2020-12-18 NOTE — Progress Notes (Signed)
Per Dr. Lindi Adie, okay to treat with echo from 08/30/2020.

## 2020-12-18 NOTE — Progress Notes (Signed)
[place] Spiritual Care Note  Visited with Ellen [name] in the [facility] Room (subwait) prior to her treatment to provide pastoral presence, reflective listening, and emotional support. She describes herself as "coming out of a fog after four months" of being inwardly focused on adjusting to diagnosis and treatment; affirmed that moving out of that very natural headspace is a significant milestone in her adjustment and probably affords fresh meaning-making possibilities as she attunes differently to others around her. 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 Ellen [name] also expressed interest in yoga and other programming upon her return. She is hopeful to receive disability starting in January, which would open up more independent local housing opportunities.  Ellen [name] is aware of ongoing chaplain availability. We plan to follow up at a future treatment.   [place], [place], [facility] Pager [phone] Voicemail [phone]

## 2020-12-19 ENCOUNTER — Ambulatory Visit: Payer: Self-pay

## 2020-12-20 ENCOUNTER — Encounter: Payer: Self-pay | Admitting: *Deleted

## 2020-12-21 ENCOUNTER — Telehealth: Payer: Self-pay | Admitting: *Deleted

## 2020-12-21 ENCOUNTER — Other Ambulatory Visit: Payer: Self-pay | Admitting: *Deleted

## 2020-12-21 MED ORDER — VENLAFAXINE HCL ER 150 MG PO CP24: 150.0000 mg | ORAL_CAPSULE | Freq: Every day | ORAL | 0 refills | Status: DC

## 2020-12-21 NOTE — Telephone Encounter (Signed)
Received call from pt stating she is currently in [place] on Vacation until 11/13 and will run out of her Effexor.  Pt requesting refill be sent to the local [place].  RN verified pharmacy with pt and sent refill request.

## 2020-12-31 ENCOUNTER — Other Ambulatory Visit: Payer: Self-pay

## 2020-12-31 ENCOUNTER — Encounter: Payer: Self-pay | Admitting: Adult Health

## 2020-12-31 DIAGNOSIS — C50919 Malignant neoplasm of unspecified site of unspecified female breast: Secondary | ICD-10-CM

## 2020-12-31 NOTE — Progress Notes (Signed)
Echo placed for chemo f/u.

## 2021-01-02 ENCOUNTER — Ambulatory Visit (HOSPITAL_COMMUNITY)
Admission: RE | Admit: 2021-01-02 | Discharge: 2021-01-02 | Disposition: A | Discharge status: Home / Self Care | Payer: Medicaid Other | Source: Ambulatory Visit | Attending: Hematology and Oncology | Admitting: Hematology and Oncology

## 2021-01-02 DIAGNOSIS — C50919 Malignant neoplasm of unspecified site of unspecified female breast: Secondary | ICD-10-CM | POA: Insufficient documentation

## 2021-01-02 DIAGNOSIS — Z01818 Encounter for other preprocedural examination: Secondary | ICD-10-CM | POA: Insufficient documentation

## 2021-01-02 DIAGNOSIS — Z0189 Encounter for other specified special examinations: Secondary | ICD-10-CM

## 2021-01-02 LAB — ECHOCARDIOGRAM COMPLETE: Area-P 1/2: 4.17 cm2

## 2021-01-02 NOTE — Progress Notes (Signed)
  Echocardiogram 2D Echocardiogram has been performed.  Darlina Sicilian M 01/02/2021, 3:37 PM

## 2021-01-07 ENCOUNTER — Other Ambulatory Visit: Payer: Self-pay

## 2021-01-07 ENCOUNTER — Other Ambulatory Visit: Payer: Self-pay | Admitting: Hematology and Oncology

## 2021-01-07 ENCOUNTER — Ambulatory Visit (HOSPITAL_COMMUNITY)
Admission: RE | Admit: 2021-01-07 | Discharge: 2021-01-07 | Disposition: A | Discharge status: Home / Self Care | Payer: Medicaid Other | Source: Ambulatory Visit | Attending: Hematology and Oncology | Admitting: Hematology and Oncology

## 2021-01-07 ENCOUNTER — Other Ambulatory Visit (HOSPITAL_COMMUNITY): Payer: Self-pay

## 2021-01-07 DIAGNOSIS — K529 Noninfective gastroenteritis and colitis, unspecified: Secondary | ICD-10-CM | POA: Diagnosis not present

## 2021-01-07 DIAGNOSIS — C50919 Malignant neoplasm of unspecified site of unspecified female breast: Secondary | ICD-10-CM | POA: Diagnosis not present

## 2021-01-07 DIAGNOSIS — C7951 Secondary malignant neoplasm of bone: Secondary | ICD-10-CM | POA: Diagnosis not present

## 2021-01-07 DIAGNOSIS — K5939 Other megacolon: Secondary | ICD-10-CM | POA: Diagnosis not present

## 2021-01-07 DIAGNOSIS — K6389 Other specified diseases of intestine: Secondary | ICD-10-CM | POA: Diagnosis not present

## 2021-01-07 MED ORDER — IOHEXOL 350 MG/ML SOLN
80.0000 mL | Freq: Once | INTRAVENOUS | Status: AC | PRN
  Administered 2021-01-07: 80 mL via INTRAVENOUS

## 2021-01-07 MED ORDER — VENLAFAXINE HCL ER 150 MG PO CP24
150.0000 mg | ORAL_CAPSULE | Freq: Every day | ORAL | 6 refills | Status: DC
  Filled 2021-01-07 – 2021-01-24 (×2): qty 30, 30d supply, fill #0
  Filled 2021-08-19: qty 30, 30d supply, fill #1

## 2021-01-07 NOTE — Progress Notes (Signed)
Patient Care Team: Pcp, No as PCP - General [name], MD as Consulting Physician (Hematology and Oncology) [name], [name], NP as Nurse Practitioner (Hematology and Oncology) [name], MD as Consulting Physician (General Surgery) Mauro Kaufmann, RN as Oncology Nurse Navigator Rockwell Germany, RN as Oncology Nurse Navigator  DIAGNOSIS:    ICD-10-CM   1. Carcinoma of breast metastatic to bone, unspecified laterality ([place])  C50.919    C79.51       SUMMARY OF ONCOLOGIC HISTORY: Oncology History  Metastatic breast cancer ([place])  08/15/2020 Imaging   Large mass in the medial left breast measuring 3.5 cm.  Prominent left axillary lymph node cluster of adjacent lymph nodes, irregular nodule right middle lobe 1.1 cm, aggressive lesion right second rib cortical destruction, multiple lucent lesions throughout the thoracic spine including T1 vertebral body and T2, irregular multilobar lesion central left hepatic lobe 4.3 x 4.7 cm.  Several satellite lesions in the left lateral hepatic lobe.  Multiple sclerotic lesions in the bones iliac wings, acetabular, medial iliac bones, sacrum multiple lesions lumbar spine L3-L4 and L5   08/17/2020 Initial Diagnosis   Biopsy revealed IDC with DCIS grade 2, ER 30%, PR 20%, HER2 equivocal by IHC, FISH positive, Ki-67 25%, lymph node positive    Genetic Testing   Negative genetic testing:  No pathogenic variants detected on the [employer] CancerNext-Expanded + RNAinsight panel. The report date is 09/06/2020.   The CancerNext-Expanded + RNAinsight gene panel offered by [employer] and includes sequencing and rearrangement analysis for the following 77 genes: AIP, ALK, APC, ATM, AXIN2, BAP1, BARD1, BLM, BMPR1A, BRCA1, BRCA2, BRIP1, CDC73, CDH1, CDK4, CDKN1B, CDKN2A, CHEK2, CTNNA1, DICER1, FANCC, FH, FLCN, GALNT12, KIF1B, LZTR1, MAX, MEN1, MET, MLH1, MSH2, MSH3, MSH6, MUTYH, NBN, NF1, NF2, NTHL1, PALB2, PHOX2B, PMS2, POT1, PRKAR1A, PTCH1, PTEN,  RAD51C, RAD51D, RB1, RECQL, RET, SDHA, SDHAF2, SDHB, SDHC, SDHD, SMAD4, SMARCA4, SMARCB1, SMARCE1, STK11, SUFU, TMEM127, TP53, TSC1, TSC2, VHL and XRCC2 (sequencing and deletion/duplication); EGFR, EGLN1, HOXB13, KIT, MITF, PDGFRA, POLD1 and POLE (sequencing only); EPCAM and GREM1 (deletion/duplication only). RNA data is routinely analyzed for use in variant interpretation for all genes.   Carcinoma of breast metastatic to bone ([place])  08/17/2020 Initial Diagnosis   Carcinoma of breast metastatic to bone [facility]); Goserelin/Zoladex started and to be given every 4 weeks    Genetic Testing   Negative genetic testing:  No pathogenic variants detected on the [facility] CancerNext-Expanded + RNAinsight panel. The report date is 09/06/2020.   The CancerNext-Expanded + RNAinsight gene panel offered by [employer] and includes sequencing and rearrangement analysis for the following 77 genes: AIP, ALK, APC, ATM, AXIN2, BAP1, BARD1, BLM, BMPR1A, BRCA1, BRCA2, BRIP1, CDC73, CDH1, CDK4, CDKN1B, CDKN2A, CHEK2, CTNNA1, DICER1, FANCC, FH, FLCN, GALNT12, KIF1B, LZTR1, MAX, MEN1, MET, MLH1, MSH2, MSH3, MSH6, MUTYH, NBN, NF1, NF2, NTHL1, PALB2, PHOX2B, PMS2, POT1, PRKAR1A, PTCH1, PTEN, RAD51C, RAD51D, RB1, RECQL, RET, SDHA, SDHAF2, SDHB, SDHC, SDHD, SMAD4, SMARCA4, SMARCB1, SMARCE1, STK11, SUFU, TMEM127, TP53, TSC1, TSC2, VHL and XRCC2 (sequencing and deletion/duplication); EGFR, EGLN1, HOXB13, KIT, MITF, PDGFRA, POLD1 and POLE (sequencing only); EPCAM and GREM1 (deletion/duplication only). RNA data is routinely analyzed for use in variant interpretation for all genes.   08/18/2020 - 08/30/2020 Radiation Therapy   Palliative radiation to the lumbar and pelvic bone; 30 Gy in 10 fractions   08/31/2020 -  Adjuvant Chemotherapy   Started with Herceptin/Perjeta on 08/31/2020 and Taxotere on 09/04/2020; she was hospitalized with dehydration and refractory diarrhea for 11  days, she resumed treatment with Taxotere (dose reduced by 35m/m2)  and Herceptin only (perjeta discontinued) on 09/26/2020, reintroduced 10/17/2020   09/03/2020 Cancer Staging   Staging form: Breast, AJCC 8th Edition - Clinical stage from 09/03/2020: Stage IV (cT4b, cN1, cM1, G2, ER+, PR+, HER2+) - Signed by CGardenia Phlegm NP on 10/03/2020 Stage prefix: Initial diagnosis Method of lymph node assessment: Clinical Histologic grading system: 3 grade system      CHIEF COMPLIANT: Cycle 6 Taxotere Herceptin and Perjeta  INTERVAL HISTORY: Ellen VARNELLis a 25y.o. with above-mentioned history of metastatic left breast cancer, currently on chemotherapy with Taxotere Herceptin Perjeta. She reports to the clinic today for treatment.  Her major complaint is related to worsening hot flashes.  She is also complaining about the Zoladex injections being uncomfortable.  She is contemplating on getting an oophorectomy.  Denies any nausea or vomiting.  She had CT scans yesterday and is here today to discuss results.  ALLERGIES:  is allergic to other.  MEDICATIONS:  Current Outpatient Medications  Medication Sig Dispense Refill   acyclovir (ZOVIRAX) 800 MG tablet Take 1 tablet (800 mg total) by mouth 2 (two) times daily as needed (flare up). 60 tablet 5   acyclovir ointment (ZOVIRAX) 5 % Apply 1 application topically 4 (four) times daily as needed. outbreak     cholestyramine (QUESTRAN) 4 g packet Take 1 packet mixed with liquid by mouth 2 times daily as needed. 60 each 12   lidocaine-prilocaine (EMLA) cream Apply to affected area once 30 g 3   LORazepam (ATIVAN) 0.5 MG tablet Take 1 tablet (0.5 mg total) by mouth at bedtime as needed for sleep. 30 tablet 0   METROGEL 1 % gel Apply topically daily. 45 g 0   Multiple Vitamin (MULTIVITAMINS PO) Take 1 tablet by mouth daily.     ondansetron (ZOFRAN) 8 MG tablet Take 1 tablet (8 mg total) by mouth 2 (two) times daily as needed for refractory nausea / vomiting. 30 tablet 1   oxyCODONE-acetaminophen  (PERCOCET/ROXICET) 5-325 MG tablet Take 1 tablet by mouth every 8 (eight) hours as needed for severe pain. 60 tablet 0   prochlorperazine (COMPAZINE) 10 MG tablet Take 1 tablet (10 mg total) by mouth every 6 (six) hours as needed for nausea or vomiting. 30 tablet 1   venlafaxine XR (EFFEXOR-XR) 150 MG 24 hr capsule Take 1 capsule (150 mg total) by mouth daily with breakfast. 30 capsule 0   venlafaxine XR (EFFEXOR-XR) 150 MG 24 hr capsule Take 1 capsule (150 mg total) by mouth daily with breakfast. 30 capsule 6   No current facility-administered medications for this visit.    PHYSICAL EXAMINATION: ECOG PERFORMANCE STATUS: 1 - Symptomatic but completely ambulatory  Vitals:   01/08/21 1017  BP: 132/74  Pulse: 91  Resp: 17  Temp: 99.3 F (37.4 C)  SpO2: 99%   Filed Weights   01/08/21 1017  Weight: 135 lb 6.4 oz (61.4 kg)    LABORATORY DATA:  I have reviewed the data as listed CMP Latest Ref Rng & Units 12/18/2020 11/28/2020 11/07/2020  Glucose 70 - 99 mg/dL 100(H) 104(H) 90  BUN 6 - 20 mg/dL _0 Creatinine 0.44 - 1.00 mg/dL 0.72 0.70 0.65  Sodium 135 - 145 mmol/L 137 136 141  Potassium 3.5 - 5.1 mmol/L 3.9 4.2 4.2  Chloride 98 - 111 mmol/L 106 105 109  CO2 22 - 32 mmol/L _1 Calcium 8.9 - 10.3 mg/dL  8.6(L) 8.8(L) 9.0  Total Protein 6.5 - 8.1 g/dL 6.0(L) 6.5 6.2(L)  Total Bilirubin 0.3 - 1.2 mg/dL <0.2(L) 0.5 0.3  Alkaline Phos 38 - 126 U/L 63 60 68  AST 15 - 41 U/L 13(L) 21 12(L)  ALT 0 - 44 U/L _0 Lab Results  Component Value Date   WBC 7.6 01/08/2021   HGB 11.6 (L) 01/08/2021   HCT 35.3 (L) 01/08/2021   MCV 84.2 01/08/2021   PLT 291 01/08/2021   NEUTROABS 6.0 01/08/2021    ASSESSMENT & PLAN:  Carcinoma of breast metastatic to bone ([place]) 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%, HER2 equivocal by  IHC, FISH positive ratio 2.8, Ki-67 25%, lymph node positive   Palliative radiation completed 09/03/2020   Treatment plan: Palliative chemotherapy with Taxotere Herceptin Perjeta every 3 weeks x6 cycles followed by Herceptin Perjeta maintenance  ------------------------------------------------------------------------------------------------------------------------------------ Current treatment:   Herceptin Perjeta maintenance Toxicities: Hospitalization 7/24-09/19/20: grade III-IV Diarrhea  Diarrhea much improved, now grade 1.  I recommended continued fluid and food intake.   Depression, no suicidal thoughts, if not feeling better in a week will take two of her effexor tablets.  Severe hot flashes: She is currently on Effexor 150 mg.    CT CAP 11/06/2020:   remarkable improvement in the liver metastases as well as lung metastases.  The bone metastases also appears to be better.    CT CAP 01/07/2021: The report has not been read.  To my review the spot in the liver are smaller.  I could not evaluate the bone metastases.  Bone metastasis: We will start her on Zometa every 3 months. She will continue Zoladex injections monthly and Herceptin Perjeta every 3 weeks.  At some point in the future we can do Herceptin Perjeta every 4 weeks.   No orders of the defined types were placed in this encounter.  The patient has a good understanding of the overall plan. she agrees with it. she will call with any problems that may develop before the next visit here.  Total time spent: 30 mins including face to face time and time spent for planning, charting and coordination of care  Rulon Eisenmenger, MD, MPH 01/08/2021  I, Thana Ates, am acting as scribe for Dr. [name].  I have reviewed the above documentation for accuracy and completeness, and I agree with the above.

## 2021-01-08 ENCOUNTER — Inpatient Hospital Stay (HOSPITAL_BASED_OUTPATIENT_CLINIC_OR_DEPARTMENT_OTHER): Payer: Medicaid Other | Admitting: Hematology and Oncology

## 2021-01-08 ENCOUNTER — Encounter: Payer: Self-pay | Admitting: *Deleted

## 2021-01-08 ENCOUNTER — Inpatient Hospital Stay: Payer: Medicaid Other | Admitting: Dietician

## 2021-01-08 ENCOUNTER — Inpatient Hospital Stay: Payer: Medicaid Other

## 2021-01-08 DIAGNOSIS — C7951 Secondary malignant neoplasm of bone: Secondary | ICD-10-CM

## 2021-01-08 DIAGNOSIS — C50912 Malignant neoplasm of unspecified site of left female breast: Secondary | ICD-10-CM

## 2021-01-08 DIAGNOSIS — C50919 Malignant neoplasm of unspecified site of unspecified female breast: Secondary | ICD-10-CM

## 2021-01-08 DIAGNOSIS — Z5111 Encounter for antineoplastic chemotherapy: Secondary | ICD-10-CM | POA: Diagnosis not present

## 2021-01-08 DIAGNOSIS — Z95828 Presence of other vascular implants and grafts: Secondary | ICD-10-CM

## 2021-01-08 LAB — CBC WITH DIFFERENTIAL (CANCER CENTER ONLY)
Abs Immature Granulocytes: 0.04 10*3/uL (ref 0.00–0.07)
Basophils Absolute: 0 10*3/uL (ref 0.0–0.1)
Basophils Relative: 0 %
Eosinophils Absolute: 0 10*3/uL (ref 0.0–0.5)
Eosinophils Relative: 0 %
HCT: 35.3 % — ABNORMAL LOW (ref 36.0–46.0)
Hemoglobin: 11.6 g/dL — ABNORMAL LOW (ref 12.0–15.0)
Immature Granulocytes: 1 %
Lymphocytes Relative: 14 %
Lymphs Abs: 1.1 10*3/uL (ref 0.7–4.0)
MCH: 27.7 pg (ref 26.0–34.0)
MCHC: 32.9 g/dL (ref 30.0–36.0)
MCV: 84.2 fL (ref 80.0–100.0)
Monocytes Absolute: 0.5 10*3/uL (ref 0.1–1.0)
Monocytes Relative: 7 %
Neutro Abs: 6 10*3/uL (ref 1.7–7.7)
Neutrophils Relative %: 78 %
Platelet Count: 291 10*3/uL (ref 150–400)
RBC: 4.19 MIL/uL (ref 3.87–5.11)
RDW: 16.4 % — ABNORMAL HIGH (ref 11.5–15.5)
WBC Count: 7.6 10*3/uL (ref 4.0–10.5)
nRBC: 0 % (ref 0.0–0.2)

## 2021-01-08 LAB — CMP (CANCER CENTER ONLY)
ALT: 12 U/L (ref 0–44)
AST: 13 U/L — ABNORMAL LOW (ref 15–41)
Albumin: 3.4 g/dL — ABNORMAL LOW (ref 3.5–5.0)
Alkaline Phosphatase: 68 U/L (ref 38–126)
Anion gap: 8 (ref 5–15)
BUN: 9 mg/dL (ref 6–20)
CO2: 25 mmol/L (ref 22–32)
Calcium: 8.9 mg/dL (ref 8.9–10.3)
Chloride: 109 mmol/L (ref 98–111)
Creatinine: 0.79 mg/dL (ref 0.44–1.00)
GFR, Estimated: >60 mL/min (ref >60)
Glucose, Bld: 110 mg/dL — ABNORMAL HIGH (ref 70–99)
Potassium: 3.8 mmol/L (ref 3.5–5.1)
Sodium: 142 mmol/L (ref 135–145)
Total Bilirubin: 0.3 mg/dL (ref 0.3–1.2)
Total Protein: 6.1 g/dL — ABNORMAL LOW (ref 6.5–8.1)

## 2021-01-08 MED ORDER — SODIUM CHLORIDE 0.9% FLUSH
10.0000 mL | INTRAVENOUS | Status: DC | PRN
  Administered 2021-01-08: 10 mL via INTRAVENOUS

## 2021-01-08 MED ORDER — SODIUM CHLORIDE 0.9% FLUSH
10.0000 mL | INTRAVENOUS | Status: DC | PRN
  Administered 2021-01-08: 10 mL

## 2021-01-08 MED ORDER — ACETAMINOPHEN 325 MG PO TABS
650.0000 mg | ORAL_TABLET | Freq: Once | ORAL | Status: AC
  Administered 2021-01-08: 650 mg via ORAL

## 2021-01-08 MED ORDER — SODIUM CHLORIDE 0.9 % IV SOLN: Freq: Once | INTRAVENOUS | Status: AC

## 2021-01-08 MED ORDER — TRASTUZUMAB-DKST CHEMO 150 MG IV SOLR
6.0000 mg/kg | Freq: Once | INTRAVENOUS | Status: AC
  Administered 2021-01-08: 378 mg via INTRAVENOUS
  Filled 2021-01-08: qty 18

## 2021-01-08 MED ORDER — HEPARIN SOD (PORK) LOCK FLUSH 100 UNIT/ML IV SOLN
500.0000 [IU] | Freq: Once | INTRAVENOUS | Status: AC | PRN
  Administered 2021-01-08: 500 [IU]

## 2021-01-08 MED ORDER — SODIUM CHLORIDE 0.9 % IV SOLN
420.0000 mg | Freq: Once | INTRAVENOUS | Status: AC
  Administered 2021-01-08: 420 mg via INTRAVENOUS
  Filled 2021-01-08: qty 14

## 2021-01-08 MED ORDER — DIPHENHYDRAMINE HCL 25 MG PO CAPS
25.0000 mg | ORAL_CAPSULE | Freq: Once | ORAL | Status: AC
  Administered 2021-01-08: 25 mg via ORAL

## 2021-01-08 NOTE — Progress Notes (Signed)
Nutrition Follow-up:   Patient receiving chemotherapy with Taxotere, Herceptin, Perjeta.   Met with patient during infusion. She is eating a sandwich at visit. Patient reports her appetite is good and eating well. Patient tolerating regular diet, denies diarrhea, constipation, nausea, vomiting. She is looking forward to eating a big meal at Thanksgiving. Patient has been drinking Enterade for the first couple of days following chemotherapy. Patient feels this has been helpful with side effects. Patient did not order additional case due to cost.    Medications: reviewed   Labs: Glucose 110  Anthropometrics: Weight 135 lb 6.4 oz today increased from 132 lb 1.6 oz on 9/21 and 128 lb 14.4 oz on 8/31   NUTRITION DIAGNOSIS: Unintentional weight loss stable   INTERVENTION:  Continue eating high calorie high protein foods for weight maintenance  Continue drinking Boost Plus/equivalent as needed  Continue drinking Enterade with treatment - samples provided to patient today Patient has contact information     MONITORING, EVALUATION, GOAL: weight trends, intake    NEXT VISIT: To be scheduled as needed

## 2021-01-08 NOTE — Assessment & Plan Note (Signed)
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, ER 30%, PR 20%, HER2 equivocal by IHC, FISH positiveratio 2.8, Ki-67 25%, lymph node positive  Palliative radiation completed 09/03/2020  Treatment plan: Palliative chemotherapy with Taxotere Herceptin Perjeta every 3 weeks x6 cycles followed by Herceptin Perjeta maintenance  ------------------------------------------------------------------------------------------------------------------------------------ Current treatment: Cycle6TaxotereHerceptin Perjeta Toxicities: Hospitalization 7/24-09/19/20:grade III-IVDiarrhea  Diarrhea much improved, now grade 1. I recommended continued fluid and food intake.  Depression, no suicidal thoughts, if not feeling better in a week will take two of her effexor tablets.  Weight loss:She has gained some weight and her albumin levels have improved.  CT CAP 11/06/2020:  remarkable improvement in the liver metastases as well as lung metastases. The bone metastases also appears to be better.    Plan is to complete a total of 6 cycles and after that she will go on maintenance of Herceptin and Perjeta if the scans look great. Is set up for the CT scans to be done after cycle 6 of treatment.

## 2021-01-08 NOTE — Patient Instructions (Signed)
[facility]COL[facility]  Discharge Instructions: Thank you for choosing [facility] to provide your oncology and hematology care.   If you have a lab appointment with the [facility], please go directly to the [facility] and check in at the registration area.   Wear comfortable clothing and clothing appropriate for easy access to any Portacath or PICC line.   We strive to give you quality time with your provider. You may need to reschedule your appointment if you arrive late (15 or more minutes).  Arriving late affects you and other patients whose appointments are after yours.  Also, if you miss three or more appointments without notifying the office, you may be dismissed from the clinic at the provider's discretion.      For prescription refill requests, have your pharmacy contact our office and allow 72 hours for refills to be completed.    Today you received the following chemotherapy and/or immunotherapy agents herceptin, perjeta      To help prevent nausea and vomiting after your treatment, we encourage you to take your nausea medication as directed.  BELOW ARE SYMPTOMS THAT SHOULD BE REPORTED IMMEDIATELY: *FEVER GREATER THAN 100.4 F (38 C) OR HIGHER *CHILLS OR SWEATING *NAUSEA AND VOMITING THAT IS NOT CONTROLLED WITH YOUR NAUSEA MEDICATION *UNUSUAL SHORTNESS OF BREATH *UNUSUAL BRUISING OR BLEEDING *URINARY PROBLEMS (pain or burning when urinating, or frequent urination) *BOWEL PROBLEMS (unusual diarrhea, constipation, pain near the anus) TENDERNESS IN MOUTH AND THROAT WITH OR WITHOUT PRESENCE OF ULCERS (sore throat, sores in mouth, or a toothache) UNUSUAL RASH, SWELLING OR PAIN  UNUSUAL VAGINAL DISCHARGE OR ITCHING   Items with * indicate a potential emergency and should be followed up as soon as possible or go to the Emergency Department if any problems should occur.  Please show the CHEMOTHERAPY ALERT CARD or IMMUNOTHERAPY ALERT CARD at  check-in to the Emergency Department and triage nurse.  Should you have questions after your visit or need to cancel or reschedule your appointment, please contact [facility]  Dept: [phone]  and follow the prompts.  Office hours are 8:00 a.m. to 4:30 p.m. Monday - Friday. Please note that voicemails left after 4:00 p.m. may not be returned until the following business day.  We are closed weekends and major holidays. You have access to a nurse at all times for urgent questions. Please call the main number to the clinic Dept: [phone] and follow the prompts.   For any non-urgent questions, you may also contact your provider using MyChart. We now offer e-Visits for anyone 59 and older to request care online for non-urgent symptoms. For details visit [email or web address].   Also download the MyChart app! Go to the app store, search "MyChart", open the app, select [facility], and log in with your MyChart username and password.  Due to Covid, a mask is required upon entering the hospital/clinic. If you do not have a mask, one will be given to you upon arrival. For doctor visits, patients may have 1 support person aged 32 or older with them. For treatment visits, patients cannot have anyone with them due to current Covid guidelines and our immunocompromised population.

## 2021-01-09 ENCOUNTER — Telehealth: Payer: Self-pay | Admitting: Hematology and Oncology

## 2021-01-09 NOTE — Telephone Encounter (Signed)
Scheduled appointment per 11/22 los. Patient is aware. 

## 2021-01-15 ENCOUNTER — Encounter: Payer: Self-pay | Admitting: *Deleted

## 2021-01-15 ENCOUNTER — Inpatient Hospital Stay: Payer: Medicaid Other

## 2021-01-15 ENCOUNTER — Other Ambulatory Visit: Payer: Self-pay

## 2021-01-15 VITALS — BP 131/90 | HR 97 | Temp 98.6°F | Resp 18

## 2021-01-15 DIAGNOSIS — C50919 Malignant neoplasm of unspecified site of unspecified female breast: Secondary | ICD-10-CM

## 2021-01-15 DIAGNOSIS — Z5111 Encounter for antineoplastic chemotherapy: Secondary | ICD-10-CM | POA: Diagnosis not present

## 2021-01-15 MED ORDER — GOSERELIN ACETATE 3.6 MG ~~LOC~~ IMPL
3.6000 mg | DRUG_IMPLANT | Freq: Once | SUBCUTANEOUS | Status: AC
  Administered 2021-01-15: 3.6 mg via SUBCUTANEOUS

## 2021-01-16 ENCOUNTER — Other Ambulatory Visit (HOSPITAL_COMMUNITY): Payer: Self-pay

## 2021-01-24 ENCOUNTER — Other Ambulatory Visit (HOSPITAL_COMMUNITY): Payer: Self-pay

## 2021-01-29 ENCOUNTER — Other Ambulatory Visit: Payer: Self-pay

## 2021-01-29 ENCOUNTER — Inpatient Hospital Stay: Payer: Medicaid Other | Attending: Adult Health

## 2021-01-29 ENCOUNTER — Inpatient Hospital Stay: Payer: Medicaid Other

## 2021-01-29 VITALS — BP 119/73 | HR 90 | Temp 98.3°F | Resp 16 | Wt 137.2 lb

## 2021-01-29 DIAGNOSIS — Z9221 Personal history of antineoplastic chemotherapy: Secondary | ICD-10-CM | POA: Diagnosis not present

## 2021-01-29 DIAGNOSIS — Z5112 Encounter for antineoplastic immunotherapy: Secondary | ICD-10-CM | POA: Diagnosis not present

## 2021-01-29 DIAGNOSIS — Z170 Estrogen receptor positive status [ER+]: Secondary | ICD-10-CM | POA: Insufficient documentation

## 2021-01-29 DIAGNOSIS — Z79899 Other long term (current) drug therapy: Secondary | ICD-10-CM | POA: Diagnosis not present

## 2021-01-29 DIAGNOSIS — Z923 Personal history of irradiation: Secondary | ICD-10-CM | POA: Diagnosis not present

## 2021-01-29 DIAGNOSIS — C50912 Malignant neoplasm of unspecified site of left female breast: Secondary | ICD-10-CM | POA: Insufficient documentation

## 2021-01-29 DIAGNOSIS — C7951 Secondary malignant neoplasm of bone: Secondary | ICD-10-CM | POA: Insufficient documentation

## 2021-01-29 DIAGNOSIS — C50919 Malignant neoplasm of unspecified site of unspecified female breast: Secondary | ICD-10-CM

## 2021-01-29 DIAGNOSIS — C787 Secondary malignant neoplasm of liver and intrahepatic bile duct: Secondary | ICD-10-CM | POA: Insufficient documentation

## 2021-01-29 DIAGNOSIS — Z95828 Presence of other vascular implants and grafts: Secondary | ICD-10-CM

## 2021-01-29 DIAGNOSIS — C773 Secondary and unspecified malignant neoplasm of axilla and upper limb lymph nodes: Secondary | ICD-10-CM | POA: Insufficient documentation

## 2021-01-29 DIAGNOSIS — Z5111 Encounter for antineoplastic chemotherapy: Secondary | ICD-10-CM | POA: Insufficient documentation

## 2021-01-29 DIAGNOSIS — Z7982 Long term (current) use of aspirin: Secondary | ICD-10-CM | POA: Insufficient documentation

## 2021-01-29 LAB — CBC WITH DIFFERENTIAL (CANCER CENTER ONLY)
Abs Immature Granulocytes: 0.01 10*3/uL (ref 0.00–0.07)
Basophils Absolute: 0 10*3/uL (ref 0.0–0.1)
Basophils Relative: 1 %
Eosinophils Absolute: 0.2 10*3/uL (ref 0.0–0.5)
Eosinophils Relative: 4 %
HCT: 34.1 % — ABNORMAL LOW (ref 36.0–46.0)
Hemoglobin: 11 g/dL — ABNORMAL LOW (ref 12.0–15.0)
Immature Granulocytes: 0 %
Lymphocytes Relative: 19 %
Lymphs Abs: 1.1 10*3/uL (ref 0.7–4.0)
MCH: 27.6 pg (ref 26.0–34.0)
MCHC: 32.3 g/dL (ref 30.0–36.0)
MCV: 85.7 fL (ref 80.0–100.0)
Monocytes Absolute: 0.5 10*3/uL (ref 0.1–1.0)
Monocytes Relative: 8 %
Neutro Abs: 3.8 10*3/uL (ref 1.7–7.7)
Neutrophils Relative %: 68 %
Platelet Count: 299 10*3/uL (ref 150–400)
RBC: 3.98 MIL/uL (ref 3.87–5.11)
RDW: 17.2 % — ABNORMAL HIGH (ref 11.5–15.5)
WBC Count: 5.6 10*3/uL (ref 4.0–10.5)
nRBC: 0 % (ref 0.0–0.2)

## 2021-01-29 LAB — CMP (CANCER CENTER ONLY)
ALT: 16 U/L (ref 0–44)
AST: 14 U/L — ABNORMAL LOW (ref 15–41)
Albumin: 3.6 g/dL (ref 3.5–5.0)
Alkaline Phosphatase: 68 U/L (ref 38–126)
Anion gap: 7 (ref 5–15)
BUN: 15 mg/dL (ref 6–20)
CO2: 25 mmol/L (ref 22–32)
Calcium: 8.8 mg/dL — ABNORMAL LOW (ref 8.9–10.3)
Chloride: 109 mmol/L (ref 98–111)
Creatinine: 0.79 mg/dL (ref 0.44–1.00)
GFR, Estimated: >60 mL/min (ref >60)
Glucose, Bld: 104 mg/dL — ABNORMAL HIGH (ref 70–99)
Potassium: 4.1 mmol/L (ref 3.5–5.1)
Sodium: 141 mmol/L (ref 135–145)
Total Bilirubin: 0.5 mg/dL (ref 0.3–1.2)
Total Protein: 6.1 g/dL — ABNORMAL LOW (ref 6.5–8.1)

## 2021-01-29 MED ORDER — SODIUM CHLORIDE 0.9 % IV SOLN
420.0000 mg | Freq: Once | INTRAVENOUS | Status: AC
  Administered 2021-01-29: 420 mg via INTRAVENOUS
  Filled 2021-01-29: qty 14

## 2021-01-29 MED ORDER — SODIUM CHLORIDE 0.9% FLUSH
10.0000 mL | INTRAVENOUS | Status: DC | PRN
  Administered 2021-01-29: 10 mL via INTRAVENOUS

## 2021-01-29 MED ORDER — ZOLEDRONIC ACID 4 MG/100ML IV SOLN
4.0000 mg | Freq: Once | INTRAVENOUS | Status: AC
  Administered 2021-01-29: 4 mg via INTRAVENOUS
  Filled 2021-01-29: qty 100

## 2021-01-29 MED ORDER — SODIUM CHLORIDE 0.9% FLUSH
10.0000 mL | INTRAVENOUS | Status: DC | PRN
  Administered 2021-01-29: 10 mL

## 2021-01-29 MED ORDER — ACETAMINOPHEN 325 MG PO TABS
650.0000 mg | ORAL_TABLET | Freq: Once | ORAL | Status: AC
  Administered 2021-01-29: 650 mg via ORAL
  Filled 2021-01-29: qty 2

## 2021-01-29 MED ORDER — DIPHENHYDRAMINE HCL 25 MG PO CAPS
25.0000 mg | ORAL_CAPSULE | Freq: Once | ORAL | Status: AC
  Administered 2021-01-29: 25 mg via ORAL
  Filled 2021-01-29: qty 1

## 2021-01-29 MED ORDER — TRASTUZUMAB-DKST CHEMO 150 MG IV SOLR
6.0000 mg/kg | Freq: Once | INTRAVENOUS | Status: AC
  Administered 2021-01-29: 378 mg via INTRAVENOUS
  Filled 2021-01-29: qty 18

## 2021-01-29 MED ORDER — SODIUM CHLORIDE 0.9 % IV SOLN: Freq: Once | INTRAVENOUS | Status: AC

## 2021-01-29 MED ORDER — HEPARIN SOD (PORK) LOCK FLUSH 100 UNIT/ML IV SOLN
500.0000 [IU] | Freq: Once | INTRAVENOUS | Status: AC | PRN
  Administered 2021-01-29: 500 [IU]

## 2021-01-29 NOTE — Patient Instructions (Signed)
[facility]COL[facility]  Discharge Instructions: Thank you for choosing [facility] to provide your oncology and hematology care.   If you have a lab appointment with the [facility], please go directly to the [facility] and check in at the registration area.   Wear comfortable clothing and clothing appropriate for easy access to any Portacath or PICC line.   We strive to give you quality time with your provider. You may need to reschedule your appointment if you arrive late (15 or more minutes).  Arriving late affects you and other patients whose appointments are after yours.  Also, if you miss three or more appointments without notifying the office, you may be dismissed from the clinic at the providers discretion.      For prescription refill requests, have your pharmacy contact our office and allow 72 hours for refills to be completed.    Today you received the following chemotherapy and/or immunotherapy agents herceptin, perjeta      To help prevent nausea and vomiting after your treatment, we encourage you to take your nausea medication as directed.  BELOW ARE SYMPTOMS THAT SHOULD BE REPORTED IMMEDIATELY: *FEVER GREATER THAN 100.4 F (38 C) OR HIGHER *CHILLS OR SWEATING *NAUSEA AND VOMITING THAT IS NOT CONTROLLED WITH YOUR NAUSEA MEDICATION *UNUSUAL SHORTNESS OF BREATH *UNUSUAL BRUISING OR BLEEDING *URINARY PROBLEMS (pain or burning when urinating, or frequent urination) *BOWEL PROBLEMS (unusual diarrhea, constipation, pain near the anus) TENDERNESS IN MOUTH AND THROAT WITH OR WITHOUT PRESENCE OF ULCERS (sore throat, sores in mouth, or a toothache) UNUSUAL RASH, SWELLING OR PAIN  UNUSUAL VAGINAL DISCHARGE OR ITCHING   Items with * indicate a potential emergency and should be followed up as soon as possible or go to the Emergency Department if any problems should occur.  Please show the CHEMOTHERAPY ALERT CARD or IMMUNOTHERAPY ALERT CARD at  check-in to the Emergency Department and triage nurse.  Should you have questions after your visit or need to cancel or reschedule your appointment, please contact [facility]  Dept: [phone]  and follow the prompts.  Office hours are 8:00 a.m. to 4:30 p.m. Monday - Friday. Please note that voicemails left after 4:00 p.m. may not be returned until the following business day.  We are closed weekends and major holidays. You have access to a nurse at all times for urgent questions. Please call the main number to the clinic Dept: [phone] and follow the prompts.   For any non-urgent questions, you may also contact your provider using MyChart. We now offer e-Visits for anyone 37 and older to request care online for non-urgent symptoms. For details visit [email or web address].   Also download the MyChart app! Go to the app store, search "MyChart", open the app, select [facility], and log in with your MyChart username and password.  Due to Covid, a mask is required upon entering the hospital/clinic. If you do not have a mask, one will be given to you upon arrival. For doctor visits, patients may have 1 support person aged 81 or older with them. For treatment visits, patients cannot have anyone with them due to current Covid guidelines and our immunocompromised population.

## 2021-01-29 NOTE — Progress Notes (Signed)
Per Dr. Lindi Adie, may proceed with zometa today as planned. Patient was advised to notify infusion center of any jaw pain and any other adverse effects. Patient verbalized understanding of potential side effects and tolerated infusion well.

## 2021-01-31 ENCOUNTER — Other Ambulatory Visit: Payer: Self-pay

## 2021-01-31 MED ORDER — VENLAFAXINE HCL ER 150 MG PO CP24: 150.0000 mg | ORAL_CAPSULE | Freq: Every day | ORAL | 0 refills | Status: DC

## 2021-02-12 ENCOUNTER — Inpatient Hospital Stay: Payer: Medicaid Other

## 2021-02-12 ENCOUNTER — Other Ambulatory Visit: Payer: Self-pay

## 2021-02-12 VITALS — BP 121/67 | HR 103 | Temp 99.5°F | Resp 16

## 2021-02-12 DIAGNOSIS — C50919 Malignant neoplasm of unspecified site of unspecified female breast: Secondary | ICD-10-CM

## 2021-02-12 DIAGNOSIS — Z5112 Encounter for antineoplastic immunotherapy: Secondary | ICD-10-CM | POA: Diagnosis not present

## 2021-02-12 MED ORDER — GOSERELIN ACETATE 3.6 MG ~~LOC~~ IMPL
3.6000 mg | DRUG_IMPLANT | Freq: Once | SUBCUTANEOUS | Status: AC
  Administered 2021-02-12: 15:00:00 3.6 mg via SUBCUTANEOUS
  Filled 2021-02-12: qty 3.6

## 2021-02-18 NOTE — Assessment & Plan Note (Addendum)
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, ER 30%, PR 20%, HER2 equivocal by IHC, FISH positiveratio 2.8, Ki-67 25%, lymph node positive  Palliative radiation completed 09/03/2020 Taxotere/Herceptin/Perjeta 08/31/2020-12/18/2020 Herceptin Perjeta Maintenance beginning 01/08/2021  Treatment plan:  Herceptin Perjeta maintenance  ------------------------------------------------------------------------------------------------------------------------------------ Current treatment: Herceptin Perjeta, Zoladex, and anastrozole Toxicities: Depression: She continues on Effexor for this and hot flashes.  Considering she is thinking about starting ADHD medication we will hold off on increasing the dose and wait for psychiatry's input. Weight loss:Her weight is stable Diarrhea: Resolved  I placed a referral for [facility] to see physical therapy about her joints and range of motion with her joints.  Should they believe she needs any additional imaging or follow-up or management of this I can always refer her to sports medicine.  We discussed her depression, ADHD, hot flashes and I will hold off on increasing her Effexor dose at this time due to the fact that they may opt to add a stimulant or stimulant like medication.  We did discuss adding anastrozole to her regimen since she is receiving Zoladex every 4 weeks.  I discussed risks and benefits with her in detail she understands these and I sent her prescription to [facility].  She knows to call me if she has any significant concerns with taking the medication.  I placed orders for [facility] next echocardiogram which is due next month.  She will return in 3 weeks for treatment and in 6 weeks for labs, follow-up with Dr. Lindi Adie, treatment.

## 2021-02-18 NOTE — Progress Notes (Signed)
[facility] Follow up:    Pcp, No No address on file   DIAGNOSIS:  Cancer Staging  Carcinoma of breast metastatic to bone [facility]) Staging form: Breast, AJCC 8th Edition - Clinical stage from 09/03/2020: Stage IV (cT4b, cN1, cM1, G2, ER+, PR+, HER2+) - Signed by Gardenia Phlegm, NP on 10/03/2020 Stage prefix: Initial diagnosis Method of lymph node assessment: Clinical Histologic grading system: 3 grade system   SUMMARY OF ONCOLOGIC HISTORY: Oncology History  Metastatic breast cancer ([place])  08/15/2020 Imaging   Large mass in the medial left breast measuring 3.5 cm.  Prominent left axillary lymph node cluster of adjacent lymph nodes, irregular nodule right middle lobe 1.1 cm, aggressive lesion right second rib cortical destruction, multiple lucent lesions throughout the thoracic spine including T1 vertebral body and T2, irregular multilobar lesion central left hepatic lobe 4.3 x 4.7 cm.  Several satellite lesions in the left lateral hepatic lobe.  Multiple sclerotic lesions in the bones iliac wings, acetabular, medial iliac bones, sacrum multiple lesions lumbar spine L3-L4 and L5   08/17/2020 Initial Diagnosis   Biopsy revealed IDC with DCIS grade 2, ER 30%, PR 20%, HER2 equivocal by IHC, FISH positive, Ki-67 25%, lymph node positive    Genetic Testing   Negative genetic testing:  No pathogenic variants detected on the [facility] CancerNext-Expanded + RNAinsight panel. The report date is 09/06/2020.   The CancerNext-Expanded + RNAinsight gene panel offered by [employer] and includes sequencing and rearrangement analysis for the following 77 genes: AIP, ALK, APC, ATM, AXIN2, BAP1, BARD1, BLM, BMPR1A, BRCA1, BRCA2, BRIP1, CDC73, CDH1, CDK4, CDKN1B, CDKN2A, CHEK2, CTNNA1, DICER1, FANCC, FH, FLCN, GALNT12, KIF1B, LZTR1, MAX, MEN1, MET, MLH1, MSH2, MSH3, MSH6, MUTYH, NBN, NF1, NF2, NTHL1, PALB2, PHOX2B, PMS2, POT1, PRKAR1A, PTCH1, PTEN, RAD51C, RAD51D, RB1, RECQL, RET, SDHA,  SDHAF2, SDHB, SDHC, SDHD, SMAD4, SMARCA4, SMARCB1, SMARCE1, STK11, SUFU, TMEM127, TP53, TSC1, TSC2, VHL and XRCC2 (sequencing and deletion/duplication); EGFR, EGLN1, HOXB13, KIT, MITF, PDGFRA, POLD1 and POLE (sequencing only); EPCAM and GREM1 (deletion/duplication only). RNA data is routinely analyzed for use in variant interpretation for all genes.   Carcinoma of breast metastatic to bone ([place])  08/17/2020 Initial Diagnosis   Carcinoma of breast metastatic to bone [facility]); Goserelin/Zoladex started and to be given every 4 weeks    Genetic Testing   Negative genetic testing:  No pathogenic variants detected on the [facility] CancerNext-Expanded + RNAinsight panel. The report date is 09/06/2020.   The CancerNext-Expanded + RNAinsight gene panel offered by [employer] and includes sequencing and rearrangement analysis for the following 77 genes: AIP, ALK, APC, ATM, AXIN2, BAP1, BARD1, BLM, BMPR1A, BRCA1, BRCA2, BRIP1, CDC73, CDH1, CDK4, CDKN1B, CDKN2A, CHEK2, CTNNA1, DICER1, FANCC, FH, FLCN, GALNT12, KIF1B, LZTR1, MAX, MEN1, MET, MLH1, MSH2, MSH3, MSH6, MUTYH, NBN, NF1, NF2, NTHL1, PALB2, PHOX2B, PMS2, POT1, PRKAR1A, PTCH1, PTEN, RAD51C, RAD51D, RB1, RECQL, RET, SDHA, SDHAF2, SDHB, SDHC, SDHD, SMAD4, SMARCA4, SMARCB1, SMARCE1, STK11, SUFU, TMEM127, TP53, TSC1, TSC2, VHL and XRCC2 (sequencing and deletion/duplication); EGFR, EGLN1, HOXB13, KIT, MITF, PDGFRA, POLD1 and POLE (sequencing only); EPCAM and GREM1 (deletion/duplication only). RNA data is routinely analyzed for use in variant interpretation for all genes.   08/18/2020 - 08/30/2020 Radiation Therapy   Palliative radiation to the lumbar and pelvic bone; 30 Gy in 10 fractions   08/31/2020 -  Adjuvant Chemotherapy   Started with Herceptin/Perjeta on 08/31/2020 and Taxotere on 09/04/2020; she was hospitalized with dehydration and refractory diarrhea for 11 days, she resumed treatment with Taxotere (  dose reduced by 39m/m2) and Herceptin only (perjeta  discontinued) on 09/26/2020, reintroduced 10/17/2020   09/03/2020 Cancer Staging   Staging form: Breast, AJCC 8th Edition - Clinical stage from 09/03/2020: Stage IV (cT4b, cN1, cM1, G2, ER+, PR+, HER2+) - Signed by CGardenia Phlegm NP on 10/03/2020 Stage prefix: Initial diagnosis Method of lymph node assessment: Clinical Histologic grading system: 3 grade system      CURRENT THERAPY: Herceptin/Perjeta  INTERVAL HISTORY: Ellen 26.[id number]. female returns for evaluation and treatment of her metastatic breast cancer.    Her most recent restaging was completed on January 08, 2021.  This showed continued improvement in the liver metastasis without measurable disease in the liver on that study.  There were some dilated small bowel loops to be monitored in follow-up.  Skeletal metastasis was unchanged.  She had a continued 4 mm unchanged right pulmonary nodule.  She is receiving Herceptin and Perjeta maintenance therapy given every 3 weeks.  She tolerates this well.  Her most recent echocardiogram was completed on January 02, 2021 which showed a left ventricular ejection fraction of 65 to 70% with normal global longitudinal strain.  She also receives Zoladex every 4 weeks.  MDevlynnis wondering if she could see a physical therapist to see if she is being active in the right way.  She is concerned about how sometimes her joints do not have full range of motion.  For instance her right hip she is not able to rotate it like she can her left hip.  Patient Active Problem List   Diagnosis Date Noted   AKI (acute kidney injury) ([place] 09/09/2020   Genetic testing 09/06/2020   Family history of thyroid cancer 08/29/2020   Family history of pancreatic cancer 08/29/2020   Carcinoma of breast metastatic to bone ([place] 08/17/2020   Metastatic breast cancer ([place] 08/16/2020   Left breast mass 08/13/2020   IUD (intrauterine device) in place 08/13/2020   Genital herpes 08/13/2020    Attention deficit hyperactivity disorder (ADHD), 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   GAD (generalized anxiety disorder) 02/16/2017   Marijuana user 02/16/2017   Mild intermittent asthma without complication 151/70/0174  Severe episode of recurrent major depressive disorder, without psychotic features ([place] 02/16/2017   Depression 04/20/2012    is allergic to other.  MEDICAL HISTORY: Past Medical History:  Diagnosis Date   Anxiety    Asthma    Exercise Induced   Cancer ([place]    Depression    Family history of pancreatic cancer    Family history of thyroid cancer    Herpes simplex     SURGICAL HISTORY: Past Surgical History:  Procedure Laterality Date   NO PAST SURGERIES     PORTACATH PLACEMENT Right 08/23/2020   Procedure: INSERTION PORT-A-CATH;  Surgeon: TDonnie Mesa MD;  [facility]  Service: General;  Laterality: Right;    SOCIAL HISTORY: Social History   Socioeconomic History   Marital status: Single    Spouse name: Not on file   Number of children: Not on file   Years of education: Not on file   Highest education level: Not on file  Occupational History   Not on file  Tobacco Use   Smoking status: Never   Smokeless tobacco: Never  Vaping Use   Vaping Use: Every day   Substances: THC  Substance and Sexual Activity   Alcohol use: Yes    Alcohol/week: 7.0 standard drinks    Types: 7 Glasses of wine  per week   Drug use: Yes    Types: Marijuana   Sexual activity: Yes    Birth control/protection: I.U.D.  Other Topics Concern   Not on file  Social History Narrative   Menarche at age 61, has a cycles every 28 days that lasts about 11 days, 4 days of spotting, two days of heavy flow, and the other days mild to moderate flow.  Received Depo provera shot x 3 years, Nexplanon x 2 years, and currently has kyleena IUD.  No OCPs.  She has never been pregnant.    Social Determinants of Health   Financial Resource Strain: Not on file   Food Insecurity: No Food Insecurity   Worried About Charity fundraiser in the Last Year: Never true   Ran Out of Food in the Last Year: Never true  Transportation Needs: No Transportation Needs   Lack of Transportation (Medical): No   Lack of Transportation (Non-Medical): No  Physical Activity: Not on file  Stress: Not on file  Social Connections: Not on file  Intimate Partner Violence: Not on file    FAMILY HISTORY: Family History  Problem Relation Age of Onset   Depression Mother    Hyperlipidemia Mother    Thyroid cancer Mother 11       papillary thyroid cancer   Hypertension Father    Atrial fibrillation Father    Irritable bowel syndrome Father        also brother   ADD / ADHD Brother    Bipolar disorder Brother    Diabetes Maternal Grandfather    Heart attack Paternal Great-grandfather 78   Pancreatic cancer Maternal Great-grandfather 88       (MGM's father)    Review of Systems  Constitutional:  Positive for fatigue. Negative for appetite change, chills, fever and unexpected weight change.  HENT:   Negative for hearing loss, lump/mass and trouble swallowing.   Eyes:  Negative for eye problems and icterus.  Respiratory:  Negative for chest tightness, cough and shortness of breath.   Cardiovascular:  Negative for chest pain, leg swelling and palpitations.  Gastrointestinal:  Negative for abdominal distention, abdominal pain, constipation, diarrhea, nausea and vomiting.  Endocrine: Positive for hot flashes.  Genitourinary:  Negative for difficulty urinating.   Musculoskeletal:  Negative for arthralgias.  Skin:  Negative for itching and rash.  Neurological:  Positive for numbness (Occasional at lower back no weakness in lower extremities or radiation of the numbness). Negative for dizziness, extremity weakness and headaches.  Hematological:  Negative for adenopathy. Does not bruise/bleed easily.  Psychiatric/Behavioral:  Negative for depression. The patient is not  nervous/anxious.      PHYSICAL EXAMINATION  ECOG PERFORMANCE STATUS: 1 - Symptomatic but completely ambulatory  Vitals:   02/19/21 1357  BP: 119/65  Pulse: 91  Resp: 17  Temp: (!) 97.4 F (36.3 C)  SpO2: 100%    Physical Exam Constitutional:      General: She is not in acute distress.    Appearance: Normal appearance. She is not toxic-appearing.  HENT:     Head: Normocephalic and atraumatic.  Eyes:     General: No scleral icterus. Cardiovascular:     Rate and Rhythm: Normal rate and regular rhythm.     Pulses: Normal pulses.     Heart sounds: Normal heart sounds.  Pulmonary:     Effort: Pulmonary effort is normal.     Breath sounds: Normal breath sounds.  Abdominal:     General: Abdomen is  flat. Bowel sounds are normal. There is no distension.     Palpations: Abdomen is soft.     Tenderness: There is no abdominal tenderness.  Musculoskeletal:        General: No swelling.     Cervical back: Neck supple.  Lymphadenopathy:     Cervical: No cervical adenopathy.  Skin:    General: Skin is warm and dry.     Findings: No rash.  Neurological:     General: No focal deficit present.     Mental Status: She is alert.  Psychiatric:        Mood and Affect: Mood normal.        Behavior: Behavior normal.    LABORATORY DATA:  CBC    Component Value Date/Time   WBC 3.9 (L) 02/19/2021 1345   WBC 5.7 09/19/2020 0337   RBC 4.17 02/19/2021 1345   HGB 11.6 (L) 02/19/2021 1345   HCT 36.5 02/19/2021 1345   PLT 278 02/19/2021 1345   MCV 87.5 02/19/2021 1345   MCH 27.8 02/19/2021 1345   MCHC 31.8 02/19/2021 1345   RDW 16.5 (H) 02/19/2021 1345   LYMPHSABS 1.2 02/19/2021 1345   MONOABS 0.3 02/19/2021 1345   EOSABS 0.1 02/19/2021 1345   BASOSABS 0.0 02/19/2021 1345    CMP     Component Value Date/Time   NA 141 01/29/2021 1000   K 4.1 01/29/2021 1000   CL 109 01/29/2021 1000   CO2 25 01/29/2021 1000   GLUCOSE 104 (H) 01/29/2021 1000   BUN 15 01/29/2021 1000    CREATININE 0.79 01/29/2021 1000   CALCIUM 8.8 (L) 01/29/2021 1000   PROT 6.1 (L) 01/29/2021 1000   ALBUMIN 3.6 01/29/2021 1000   AST 14 (L) 01/29/2021 1000   ALT 16 01/29/2021 1000   ALKPHOS 68 01/29/2021 1000   BILITOT 0.5 01/29/2021 1000   GFRNONAA >60 01/29/2021 1000       ASSESSMENT and THERAPY PLAN:   Carcinoma of breast metastatic to bone ([place]) 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, FISH positive ratio 2.8, Ki-67 25%, lymph node positive   Palliative radiation completed 09/03/2020 Taxotere/Herceptin/Perjeta 08/31/2020-12/18/2020 Herceptin Perjeta Maintenance  beginning 01/08/2021  Treatment plan:  Herceptin Perjeta maintenance  ------------------------------------------------------------------------------------------------------------------------------------ Current treatment:  Herceptin Perjeta, Zoladex, and anastrozole Toxicities: Depression: She continues on Effexor for this and hot flashes.  Considering she is thinking about starting ADHD medication we will hold off on increasing the dose and wait for psychiatry's input. Weight loss: Her weight is stable Diarrhea: Resolved   I placed a referral for [place] to see physical therapy about her joints and range of motion with her joints.  Should they believe she needs any additional imaging or follow-up or management of this I can always refer her to sports medicine.  We discussed her depression, ADHD, hot flashes and I will hold off on increasing her Effexor dose at this time due to the fact that they may opt to add a stimulant or stimulant like medication.  We did discuss adding anastrozole to her regimen since she is receiving Zoladex every 4 weeks.  I discussed risks and benefits with her in detail she understands these and I sent her prescription  to [facility].  She knows to call me if she has any significant concerns with taking the  medication.  I placed orders for [facility] next echocardiogram which is due next month.  She will return in 3 weeks for treatment and in 6 weeks for labs, follow-up with Dr. Lindi Adie, treatment.     All questions were answered. The patient knows to call the clinic with any problems, questions or concerns. We can certainly see the patient much sooner if necessary.  Total encounter time: 20 minutes in face-to-face visit time, chart review, lab review, care coordination, and documentation of the encounter.  Wilber Bihari, NP 02/19/21 2:18 PM Medical Oncology and Hematology [facility] [place], [place] [address] Tel. [phone]    Fax. [phone]  *Total Encounter Time as defined by the Centers for Medicare and Medicaid Services includes, in addition to the face-to-face time of a patient visit (documented in the note above) non-face-to-face time: obtaining and reviewing outside history, ordering and reviewing medications, tests or procedures, care coordination (communications with other health care professionals or caregivers) and documentation in the medical record.

## 2021-02-19 ENCOUNTER — Encounter: Payer: Self-pay | Admitting: Hematology and Oncology

## 2021-02-19 ENCOUNTER — Inpatient Hospital Stay: Payer: Medicaid Other | Attending: Adult Health

## 2021-02-19 ENCOUNTER — Other Ambulatory Visit: Payer: Self-pay

## 2021-02-19 ENCOUNTER — Inpatient Hospital Stay (HOSPITAL_BASED_OUTPATIENT_CLINIC_OR_DEPARTMENT_OTHER): Payer: Medicaid Other | Admitting: Adult Health

## 2021-02-19 ENCOUNTER — Other Ambulatory Visit (HOSPITAL_COMMUNITY): Payer: Self-pay

## 2021-02-19 ENCOUNTER — Inpatient Hospital Stay: Payer: Medicaid Other

## 2021-02-19 ENCOUNTER — Encounter: Payer: Self-pay | Admitting: Adult Health

## 2021-02-19 VITALS — BP 119/65 | HR 91 | Temp 97.4°F | Resp 17 | Wt 136.5 lb

## 2021-02-19 DIAGNOSIS — Z9221 Personal history of antineoplastic chemotherapy: Secondary | ICD-10-CM | POA: Insufficient documentation

## 2021-02-19 DIAGNOSIS — Z170 Estrogen receptor positive status [ER+]: Secondary | ICD-10-CM | POA: Diagnosis not present

## 2021-02-19 DIAGNOSIS — C7951 Secondary malignant neoplasm of bone: Secondary | ICD-10-CM

## 2021-02-19 DIAGNOSIS — C50919 Malignant neoplasm of unspecified site of unspecified female breast: Secondary | ICD-10-CM | POA: Diagnosis not present

## 2021-02-19 DIAGNOSIS — Z800 Family history of malignant neoplasm of digestive organs: Secondary | ICD-10-CM | POA: Diagnosis not present

## 2021-02-19 DIAGNOSIS — C50912 Malignant neoplasm of unspecified site of left female breast: Secondary | ICD-10-CM | POA: Diagnosis not present

## 2021-02-19 DIAGNOSIS — Z923 Personal history of irradiation: Secondary | ICD-10-CM | POA: Diagnosis not present

## 2021-02-19 DIAGNOSIS — C773 Secondary and unspecified malignant neoplasm of axilla and upper limb lymph nodes: Secondary | ICD-10-CM | POA: Insufficient documentation

## 2021-02-19 DIAGNOSIS — Z5112 Encounter for antineoplastic immunotherapy: Secondary | ICD-10-CM | POA: Diagnosis present

## 2021-02-19 DIAGNOSIS — Z95828 Presence of other vascular implants and grafts: Secondary | ICD-10-CM

## 2021-02-19 DIAGNOSIS — Z5189 Encounter for other specified aftercare: Secondary | ICD-10-CM | POA: Insufficient documentation

## 2021-02-19 DIAGNOSIS — Z808 Family history of malignant neoplasm of other organs or systems: Secondary | ICD-10-CM | POA: Diagnosis not present

## 2021-02-19 DIAGNOSIS — C787 Secondary malignant neoplasm of liver and intrahepatic bile duct: Secondary | ICD-10-CM | POA: Diagnosis not present

## 2021-02-19 LAB — CBC WITH DIFFERENTIAL (CANCER CENTER ONLY)
Abs Immature Granulocytes: 0.01 10*3/uL (ref 0.00–0.07)
Basophils Absolute: 0 10*3/uL (ref 0.0–0.1)
Basophils Relative: 0 %
Eosinophils Absolute: 0.1 10*3/uL (ref 0.0–0.5)
Eosinophils Relative: 2 %
HCT: 36.5 % (ref 36.0–46.0)
Hemoglobin: 11.6 g/dL — ABNORMAL LOW (ref 12.0–15.0)
Immature Granulocytes: 0 %
Lymphocytes Relative: 31 %
Lymphs Abs: 1.2 10*3/uL (ref 0.7–4.0)
MCH: 27.8 pg (ref 26.0–34.0)
MCHC: 31.8 g/dL (ref 30.0–36.0)
MCV: 87.5 fL (ref 80.0–100.0)
Monocytes Absolute: 0.3 10*3/uL (ref 0.1–1.0)
Monocytes Relative: 8 %
Neutro Abs: 2.3 10*3/uL (ref 1.7–7.7)
Neutrophils Relative %: 59 %
Platelet Count: 278 10*3/uL (ref 150–400)
RBC: 4.17 MIL/uL (ref 3.87–5.11)
RDW: 16.5 % — ABNORMAL HIGH (ref 11.5–15.5)
WBC Count: 3.9 10*3/uL — ABNORMAL LOW (ref 4.0–10.5)
nRBC: 0 % (ref 0.0–0.2)

## 2021-02-19 LAB — CMP (CANCER CENTER ONLY)
ALT: 13 U/L (ref 0–44)
AST: 13 U/L — ABNORMAL LOW (ref 15–41)
Albumin: 3.7 g/dL (ref 3.5–5.0)
Alkaline Phosphatase: 68 U/L (ref 38–126)
Anion gap: 5 (ref 5–15)
BUN: 14 mg/dL (ref 6–20)
CO2: 25 mmol/L (ref 22–32)
Calcium: 8.6 mg/dL — ABNORMAL LOW (ref 8.9–10.3)
Chloride: 108 mmol/L (ref 98–111)
Creatinine: 0.63 mg/dL (ref 0.44–1.00)
GFR, Estimated: >60 mL/min (ref >60)
Glucose, Bld: 103 mg/dL — ABNORMAL HIGH (ref 70–99)
Potassium: 4.3 mmol/L (ref 3.5–5.1)
Sodium: 138 mmol/L (ref 135–145)
Total Bilirubin: 0.4 mg/dL (ref 0.3–1.2)
Total Protein: 6.2 g/dL — ABNORMAL LOW (ref 6.5–8.1)

## 2021-02-19 MED ORDER — DIPHENHYDRAMINE HCL 25 MG PO CAPS
25.0000 mg | ORAL_CAPSULE | Freq: Once | ORAL | Status: AC
  Administered 2021-02-19: 25 mg via ORAL
  Filled 2021-02-19: qty 1

## 2021-02-19 MED ORDER — HEPARIN SOD (PORK) LOCK FLUSH 100 UNIT/ML IV SOLN
500.0000 [IU] | Freq: Once | INTRAVENOUS | Status: AC | PRN
  Administered 2021-02-19: 500 [IU]

## 2021-02-19 MED ORDER — SODIUM CHLORIDE 0.9 % IV SOLN
420.0000 mg | Freq: Once | INTRAVENOUS | Status: DC
  Filled 2021-02-19: qty 14

## 2021-02-19 MED ORDER — TRASTUZUMAB-DKST CHEMO 150 MG IV SOLR
6.0000 mg/kg | Freq: Once | INTRAVENOUS | Status: AC
  Administered 2021-02-19: 378 mg via INTRAVENOUS
  Filled 2021-02-19: qty 18

## 2021-02-19 MED ORDER — SODIUM CHLORIDE 0.9 % IV SOLN: Freq: Once | INTRAVENOUS | Status: AC

## 2021-02-19 MED ORDER — ACETAMINOPHEN 325 MG PO TABS
650.0000 mg | ORAL_TABLET | Freq: Once | ORAL | Status: AC
  Administered 2021-02-19: 650 mg via ORAL
  Filled 2021-02-19: qty 2

## 2021-02-19 MED ORDER — SODIUM CHLORIDE 0.9% FLUSH
10.0000 mL | INTRAVENOUS | Status: DC | PRN
  Administered 2021-02-19: 10 mL

## 2021-02-19 MED ORDER — ANASTROZOLE 1 MG PO TABS
1.0000 mg | ORAL_TABLET | Freq: Every day | ORAL | 3 refills | Status: DC
  Filled 2021-02-19: qty 90, 90d supply, fill #0
  Filled 2021-05-20: qty 90, 90d supply, fill #1
  Filled 2021-08-19: qty 90, 90d supply, fill #2

## 2021-02-19 MED ORDER — SODIUM CHLORIDE 0.9% FLUSH
10.0000 mL | INTRAVENOUS | Status: AC | PRN
  Administered 2021-02-19: 10 mL

## 2021-02-19 NOTE — Progress Notes (Signed)
Due to supply/stock issue, pt will only get Ogivri today.  She is already accessed and pre-medicated. She will return tomorrow for Perjeta. Dr. Lindi Adie aware.  Kennith Center, Pharm.D., CPP 02/19/2021@3 :12 PM

## 2021-02-19 NOTE — Patient Instructions (Signed)
[facility]COLOG[facility]  Discharge Instructions: Thank you for choosing [facility] to provide your oncology and hematology care.   If you have a lab appointment with the Cancer Center, please go directly to the Cancer Center and check in at the registration area.   Wear comfortable clothing and clothing appropriate for easy access to any Portacath or PICC line.   We strive to give you quality time with your provider. You may need to reschedule your appointment if you arrive late (15 or more minutes).  Arriving late affects you and other patients whose appointments are after yours.  Also, if you miss three or more appointments without notifying the office, you may be dismissed from the clinic at the provider's discretion.      For prescription refill requests, have your pharmacy contact our office and allow 72 hours for refills to be completed.    Today you received the following chemotherapy and/or immunotherapy agents herceptin      To help prevent nausea and vomiting after your treatment, we encourage you to take your nausea medication as directed.  BELOW ARE SYMPTOMS THAT SHOULD BE REPORTED IMMEDIATELY: *FEVER GREATER THAN 100.4 F (38 C) OR HIGHER *CHILLS OR SWEATING *NAUSEA AND VOMITING THAT IS NOT CONTROLLED WITH YOUR NAUSEA MEDICATION *UNUSUAL SHORTNESS OF BREATH *UNUSUAL BRUISING OR BLEEDING *URINARY PROBLEMS (pain or burning when urinating, or frequent urination) *BOWEL PROBLEMS (unusual diarrhea, constipation, pain near the anus) TENDERNESS IN MOUTH AND THROAT WITH OR WITHOUT PRESENCE OF ULCERS (sore throat, sores in mouth, or a toothache) UNUSUAL RASH, SWELLING OR PAIN  UNUSUAL VAGINAL DISCHARGE OR ITCHING   Items with * indicate a potential emergency and should be followed up as soon as possible or go to the Emergency Department if any problems should occur.  Please show the CHEMOTHERAPY ALERT CARD or IMMUNOTHERAPY ALERT CARD at check-in to  the Emergency Department and triage nurse.  Should you have questions after your visit or need to cancel or reschedule your appointment, please contact [facility]  Dept: [phone]  and follow the prompts.  Office hours are 8:00 a.m. to 4:30 p.m. Monday - Friday. Please note that voicemails left after 4:00 p.m. may not be returned until the following business day.  We are closed weekends and major holidays. You have access to a nurse at all times for urgent questions. Please call the main number to the clinic Dept: [phone] and follow the prompts.   For any non-urgent questions, you may also contact your provider using MyChart. We now offer e-Visits for anyone 18 and older to request care online for non-urgent symptoms. For details visit [email or web address].   Also download the MyChart app! Go to the app store, search "MyChart", open the app, select , and log in with your MyChart username and password.  Due to Covid, a mask is required upon entering the hospital/clinic. If you do not have a mask, one will be given to you upon arrival. For doctor visits, patients may have 1 support person aged 18 or older with them. For treatment visits, patients cannot have anyone with them due to current Covid guidelines and our immunocompromised population.   

## 2021-02-20 ENCOUNTER — Inpatient Hospital Stay: Payer: Medicaid Other

## 2021-02-20 VITALS — BP 122/75 | HR 97 | Temp 97.9°F | Resp 18

## 2021-02-20 DIAGNOSIS — C50912 Malignant neoplasm of unspecified site of left female breast: Secondary | ICD-10-CM

## 2021-02-20 DIAGNOSIS — Z5112 Encounter for antineoplastic immunotherapy: Secondary | ICD-10-CM | POA: Diagnosis not present

## 2021-02-20 DIAGNOSIS — C50919 Malignant neoplasm of unspecified site of unspecified female breast: Secondary | ICD-10-CM

## 2021-02-20 MED ORDER — HEPARIN SOD (PORK) LOCK FLUSH 100 UNIT/ML IV SOLN
500.0000 [IU] | Freq: Once | INTRAVENOUS | Status: AC | PRN
  Administered 2021-02-20: 500 [IU]

## 2021-02-20 MED ORDER — SODIUM CHLORIDE 0.9 % IV SOLN: Freq: Once | INTRAVENOUS | Status: AC

## 2021-02-20 MED ORDER — SODIUM CHLORIDE 0.9% FLUSH
10.0000 mL | INTRAVENOUS | Status: DC | PRN
  Administered 2021-02-20: 10 mL

## 2021-02-20 MED ORDER — SODIUM CHLORIDE 0.9 % IV SOLN
420.0000 mg | Freq: Once | INTRAVENOUS | Status: AC
  Administered 2021-02-20: 420 mg via INTRAVENOUS
  Filled 2021-02-20: qty 14

## 2021-02-20 MED ORDER — DIPHENHYDRAMINE HCL 25 MG PO CAPS
25.0000 mg | ORAL_CAPSULE | Freq: Once | ORAL | Status: AC
  Administered 2021-02-20: 25 mg via ORAL
  Filled 2021-02-20: qty 1

## 2021-02-20 MED ORDER — ACETAMINOPHEN 325 MG PO TABS
650.0000 mg | ORAL_TABLET | Freq: Once | ORAL | Status: AC
  Administered 2021-02-20: 650 mg via ORAL
  Filled 2021-02-20: qty 2

## 2021-02-20 NOTE — Patient Instructions (Signed)
[facility] ONCOLOG[facility]  Discharge Instructions: Thank you for choosing [facility] to provide your oncology and hematology care.   If you have a lab appointment with the [facility], please go directly to the [facility] and check in at the registration area.   Wear comfortable clothing and clothing appropriate for easy access to any Portacath or PICC line.   We strive to give you quality time with your provider. You may need to reschedule your appointment if you arrive late (15 or more minutes).  Arriving late affects you and other patients whose appointments are after yours.  Also, if you miss three or more appointments without notifying the office, you may be dismissed from the clinic at the providers discretion.      For prescription refill requests, have your pharmacy contact our office and allow 72 hours for refills to be completed.    Today you received the following chemotherapy and/or immunotherapy agents herceptin, perjeta      To help prevent nausea and vomiting after your treatment, we encourage you to take your nausea medication as directed.  BELOW ARE SYMPTOMS THAT SHOULD BE REPORTED IMMEDIATELY: *FEVER GREATER THAN 100.4 F (38 C) OR HIGHER *CHILLS OR SWEATING *NAUSEA AND VOMITING THAT IS NOT CONTROLLED WITH YOUR NAUSEA MEDICATION *UNUSUAL SHORTNESS OF BREATH *UNUSUAL BRUISING OR BLEEDING *URINARY PROBLEMS (pain or burning when urinating, or frequent urination) *BOWEL PROBLEMS (unusual diarrhea, constipation, pain near the anus) TENDERNESS IN MOUTH AND THROAT WITH OR WITHOUT PRESENCE OF ULCERS (sore throat, sores in mouth, or a toothache) UNUSUAL RASH, SWELLING OR PAIN  UNUSUAL VAGINAL DISCHARGE OR ITCHING   Items with * indicate a potential emergency and should be followed up as soon as possible or go to the Emergency Department if any problems should occur.  Please show the CHEMOTHERAPY ALERT CARD or IMMUNOTHERAPY ALERT CARD at  check-in to the Emergency Department and triage nurse.  Should you have questions after your visit or need to cancel or reschedule your appointment, please contact [facility]  Dept: [phone]  and follow the prompts.  Office hours are 8:00 a.m. to 4:30 p.m. Monday - Friday. Please note that voicemails left after 4:00 p.m. may not be returned until the following business day.  We are closed weekends and major holidays. You have access to a nurse at all times for urgent questions. Please call the main number to the clinic Dept: [phone] and follow the prompts.   For any non-urgent questions, you may also contact your provider using MyChart. We now offer e-Visits for anyone 59 and older to request care online for non-urgent symptoms. For details visit [email or web address].   Also download the MyChart app! Go to the app store, search "MyChart", open the app, select [facility], and log in with your MyChart username and password.  Due to Covid, a mask is required upon entering the hospital/clinic. If you do not have a mask, one will be given to you upon arrival. For doctor visits, patients may have 1 support person aged 92 or older with them. For treatment visits, patients cannot have anyone with them due to current Covid guidelines and our immunocompromised population.

## 2021-02-26 ENCOUNTER — Other Ambulatory Visit: Payer: Self-pay

## 2021-02-26 ENCOUNTER — Ambulatory Visit: Payer: Medicaid Other | Attending: Adult Health | Admitting: Rehabilitation

## 2021-02-26 ENCOUNTER — Encounter: Payer: Self-pay | Admitting: Rehabilitation

## 2021-02-26 DIAGNOSIS — C50919 Malignant neoplasm of unspecified site of unspecified female breast: Secondary | ICD-10-CM | POA: Diagnosis present

## 2021-02-26 DIAGNOSIS — C7951 Secondary malignant neoplasm of bone: Secondary | ICD-10-CM | POA: Diagnosis not present

## 2021-02-26 NOTE — Therapy (Signed)
[facility] @ [address] [place], [place], [address] Phone: [phone]   Fax:  [phone]  Physical Therapy Evaluation  Patient Details  Name: Ellen [name] MRN: [id number] Date of Birth: Jun 03, 1995 Referring Provider (PT): Wilber Bihari NP   Encounter Date: 02/26/2021   PT End of Session - 02/26/21 2134     Visit Number 1    Number of Visits 13    Date for PT Re-Evaluation 04/09/21    Authorization Type medicaid no auth needed    PT Start Time 1000    PT Stop Time 1034    PT Time Calculation (min) 34 min    Activity Tolerance Patient tolerated treatment well    Behavior During Therapy [facility] for tasks assessed/performed             Past Medical History:  Diagnosis Date   Anxiety    Asthma    Exercise Induced   Cancer ([place])    Depression    Family history of pancreatic cancer    Family history of thyroid cancer    Herpes simplex     Past Surgical History:  Procedure Laterality Date   NO PAST SURGERIES     PORTACATH PLACEMENT Right 08/23/2020   Procedure: INSERTION PORT-A-CATH;  Surgeon: [name], MD;  Location: [place];  Service: General;  Laterality: Right;    There were no vitals filed for this visit.    Subjective Assessment - 02/26/21 1001     Subjective I feel like i feel weak all of the time.  I feel like I am struggling with Rt leg ROM, Lt shoulder    Pertinent History 08/15/20: Discovery of Lt breast cancer, sclerotic lesions in ribs, thoracic spine, liver, iliac wings, acetabulur, iliac, and sacral bones, and lumbar spine. Completed palliative radiation to lumbar spine and pelvis. Chemotherapy started Herceptin and Perjeta maintenance every 3 weeks. Compression fractures L3-5 with canal stenosis    Patient Stated Goals am I doing things correctly?    Currently in Pain? No/denies   pain in Lt shoulder and Rt hip with too much movement, back pain intermittently.  No  functional pain               [facility] PT Assessment - 02/26/21 0001       Assessment   Medical Diagnosis metastatic breast cancer    Referring Provider (PT) Wilber Bihari NP    Onset Date/Surgical Date 08/08/20    Prior Therapy no      Precautions   Precaution Comments *Active bone mets: complete spine, Lt shoulder, Rt hip, pelvis, femur, sacrum and ribs bil, L3-5* No added resistance      Restrictions   Weight Bearing Restrictions No      Balance Screen   Has the patient fallen in the past 6 months No      [place] residence      Prior Function   Level of Independence Independent with basic ADLs    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    Leisure reports being normally active but not currently since diagnosis and hospitalization      Cognition   Overall Cognitive Status Within Functional Limits for tasks assessed      ROM / Strength   AROM / PROM / Strength AROM;Strength      AROM   Overall AROM Comments measured in standing    AROM  Assessment Site Hip    Right/Left Hip Right;Left    Right Hip Extension 30   anterior hip tightness - slight back pain   Right Hip Flexion 85   with pull feeling in Rt glute   Right Hip ABduction 45   with tightness in groin   Left Hip Extension 30    Left Hip Flexion 100    Left Hip ABduction 45      Strength   Overall Strength Unable to assess      Ambulation/Gait   Gait Comments walks without pain or AD      Functional Gait  Assessment   Gait assessed  No                        Objective measurements completed on examination: See above findings.       [facility] Adult PT Treatment/Exercise - 02/26/21 0001       Self-Care   Self-Care --    Other Self-Care Comments  Discussion about current recommendations to exercise and mobility in regards to bony mets including: no added resistance to all joints due to widespread, the importance of  continued functional strength and movements knowing that there is an inherent risk of fracture with ADLs and that it is no higher when participating in PT, and the higher risk of inactivity being worse than safely staying active. Discussed how L3-5 canal stenosis could cause some Rt hip symptoms and/or more of a protective guarding of the hip muscles due to joint status.  Discussed avoiding end range spinal rotation and extension as well as full end range stretches that feel like they are pushing into pain or resistance.  Also discussed aquatics alternating with land PT for core activation and education.  Pt would like to try this                          PT Long Term Goals - 02/26/21 2146       PT LONG TERM GOAL #1   Title Pt will be educated on safe exercise and functional mobility living with bony mets    Time 6    Period Weeks    Status New      PT LONG TERM GOAL #2   Title Pt will report improvement in strength with ADLs by at least 50%    Time 6    Period Weeks    Status New      PT LONG TERM GOAL #3   Title Pt will be ind with final HEP    Time 6    Period Weeks    Status New                    Plan - 02/26/21 2135     Clinical Impression Statement Pt presents with recent diagnosis of stage IV breast cancer with widespread bony mets including the complete spine, and articulating joints like the shoulder and hips.  Pt was hospitalized for around 10 days and has felt very weak ever since.  Pt is also finding conflicting information about what it is safe to do so this session was focused on education. Discussion about current recommendations to exercise and mobility in regards to bony mets including: no added resistance to all joints due to widespread, the importance of continued functional strength and movements knowing that there is an inherent risk of fracture with ADLs and that it is  no higher when participating in PT, and inactivity being more risky  than safely staying active. Discussed how L3-5 canal stenosis could cause some Rt hip symptoms and/or more of a protective guarding of the hip muscles due to joint status.  Discussed avoiding end range spinal rotation and extension as well as full end range stretches that feel like they are pushing into pain or resistance.  Also discussed aquatics alternating with land PT for core activation and education.  Pt will benefit from PT to improve functional endurance in a off weighted modality like water and to learn core activation for spinal protection.    Personal Factors and Comorbidities Comorbidity 2    Comorbidities active cancer, bony mets    Examination-Activity Limitations Squat;Stairs    Stability/Clinical Decision Making Evolving/Moderate complexity    Clinical Decision Making Moderate    Rehab Potential Good    PT Frequency 2x / week    PT Duration 6 weeks    PT Treatment/Interventions Aquatic Therapy;Dry needling;Therapeutic activities;Therapeutic exercise;Manual techniques    PT Next Visit Plan aquatics alternating with land based core isometic activities and education to maintain endurance and mobility *no added resistance, avoid joint mob/PROM/spinal rotation/bridging, monitor and stop activities with increasing functional pain    Consulted and Agree with Plan of Care Patient             Patient will benefit from skilled therapeutic intervention in order to improve the following deficits and impairments:  Decreased range of motion, Pain, Decreased knowledge of precautions  Visit Diagnosis: Carcinoma of breast metastatic to bone, unspecified laterality [facility]     Problem List Patient Active Problem List   Diagnosis Date Noted   AKI (acute kidney injury) ([place]) 09/09/2020   Genetic testing 09/06/2020   Family history of thyroid cancer 08/29/2020   Family history of pancreatic cancer 08/29/2020   Carcinoma of breast metastatic to bone ([place]) 08/17/2020   Metastatic breast  cancer ([place]) 08/16/2020   Left breast mass 08/13/2020   IUD (intrauterine device) in place 08/13/2020   Genital herpes 08/13/2020   Attention deficit hyperactivity disorder (ADHD), 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   GAD (generalized anxiety disorder) 02/16/2017   Marijuana user 02/16/2017   Mild intermittent asthma without complication 29/56/2130   Severe episode of recurrent major depressive disorder, without psychotic features ([place]) 02/16/2017   Depression 04/20/2012    Stark Bray, PT 02/26/2021, 9:50 PM  [facility] @ [address], [place], [address] Phone: [phone]   Fax:  [phone]  Name: Ellen [name] MRN: [id number] Date of Birth: 02-08-1996

## 2021-03-06 ENCOUNTER — Other Ambulatory Visit: Payer: Self-pay

## 2021-03-06 ENCOUNTER — Ambulatory Visit: Payer: Medicaid Other | Admitting: Physical Therapy

## 2021-03-06 ENCOUNTER — Encounter: Payer: Self-pay | Admitting: Physical Therapy

## 2021-03-06 DIAGNOSIS — C7951 Secondary malignant neoplasm of bone: Secondary | ICD-10-CM

## 2021-03-06 DIAGNOSIS — C50919 Malignant neoplasm of unspecified site of unspecified female breast: Secondary | ICD-10-CM | POA: Diagnosis not present

## 2021-03-06 NOTE — Therapy (Signed)
[facility] @ [address] [place], [place], [address] Phone: [phone]   Fax:  [phone]  Physical Therapy Treatment  Patient Details  Name: Ellen [name] MRN: [id number] Date of Birth: Sep 26, 1995 Referring Provider (PT): Wilber Bihari NP   Encounter Date: 03/06/2021   PT End of Session - 03/06/21 1403     Visit Number 2    Number of Visits 13    Date for PT Re-Evaluation 04/09/21    Authorization Type medicaid no auth needed    PT Start Time 1400    PT Stop Time 1435    PT Time Calculation (min) 35 min    Activity Tolerance Patient tolerated treatment well    Behavior During Therapy [facility] for tasks assessed/performed             Past Medical History:  Diagnosis Date   Anxiety    Asthma    Exercise Induced   Cancer ([place])    Depression    Family history of pancreatic cancer    Family history of thyroid cancer    Herpes simplex     Past Surgical History:  Procedure Laterality Date   NO PAST SURGERIES     PORTACATH PLACEMENT Right 08/23/2020   Procedure: INSERTION PORT-A-CATH;  Surgeon: [name], MD;  Location: [facility];  Service: General;  Laterality: Right;    There were no vitals filed for this visit.   Subjective Assessment - 03/06/21 1401     Subjective I am doing ok this afternoon.    Pertinent History 08/15/20: Discovery of Lt breast cancer, sclerotic lesions in ribs, thoracic spine, liver, iliac wings, acetabulur, iliac, and sacral bones, and lumbar spine. Completed palliative radiation to lumbar spine and pelvis. Chemotherapy started Herceptin and Perjeta maintenance every 3 weeks. Compression fractures L3-5 with canal stenosis    Currently in Pain? Yes   Just a global feel.   Pain Score 2                                [facility] Adult PT Treatment/Exercise - 03/06/21 0001       Pilates   Other Pilates Pilates breathng to stimulate TA 6x, added  small LE movements to challenge core and give for HEP.                          PT Long Term Goals - 02/26/21 2146       PT LONG TERM GOAL #1   Title Pt will be educated on safe exercise and functional mobility living with bony mets    Time 6    Period Weeks    Status New      PT LONG TERM GOAL #2   Title Pt will report improvement in strength with ADLs by at least 50%    Time 6    Period Weeks    Status New      PT LONG TERM GOAL #3   Title Pt will be ind with final HEP    Time 6    Period Weeks    Status New                   Plan - 03/06/21 1408     Clinical Impression Statement Pt arrives for land PT today feeling good, no significant pain to report. Pt was educated in  foundational supine core exercises with a Pilates bias involving both small LE and UE movements. Low repetitions demonstrated to be very challenging to pt's LE muscles. Pt verbally understands slow progression is what is expected.    Personal Factors and Comorbidities Comorbidity 2    Comorbidities active cancer, bony mets    Examination-Activity Limitations Squat;Stairs    Stability/Clinical Decision Making Evolving/Moderate complexity    Rehab Potential Good    PT Frequency 2x / week    PT Duration 6 weeks    PT Treatment/Interventions Aquatic Therapy;Dry needling;Therapeutic activities;Therapeutic exercise;Manual techniques    PT Next Visit Plan aquatics alternating with land based core isometic activities and education to maintain endurance and mobility *no added resistance, avoid joint mob/PROM/spinal rotation/bridging, monitor and stop activities with increasing functional pain    PT Home Exercise Plan Access Code: [facility]  URL: [email or web address]  Date: 03/06/2021  Prepared by: Myrene Galas    Exercises  Supine Transversus Abdominis Bracing - Hands on Stomach - 2 x daily - 7 x weekly - 1 sets - 3 reps  Supine Transversus Abdominis Bracing with Double Leg  Fallout - 2 x daily - 7 x weekly - 1 sets - 3 reps  Supine March - 1 x daily - 7 x weekly - 1 sets - 5 reps  Supine Shoulder Flexion AAROM with Dowel - 1 x daily - 7 x weekly - 3 sets - 10 reps    Consulted and Agree with Plan of Care Patient             Patient will benefit from skilled therapeutic intervention in order to improve the following deficits and impairments:  Decreased range of motion, Pain, Decreased knowledge of precautions  Visit Diagnosis: Carcinoma of breast metastatic to bone, unspecified laterality [facility])     Problem List Patient Active Problem List   Diagnosis Date Noted   AKI (acute kidney injury) ([place]) 09/09/2020   Genetic testing 09/06/2020   Family history of thyroid cancer 08/29/2020   Family history of pancreatic cancer 08/29/2020   Carcinoma of breast metastatic to bone ([place]) 08/17/2020   Metastatic breast cancer ([place]) 08/16/2020   Left breast mass 08/13/2020   IUD (intrauterine device) in place 08/13/2020   Genital herpes 08/13/2020   Attention deficit hyperactivity disorder (ADHD), 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   GAD (generalized anxiety disorder) 02/16/2017   Marijuana user 02/16/2017   Mild intermittent asthma without complication 75/91/6384   Severe episode of recurrent major depressive disorder, without psychotic features ([place]) 02/16/2017   Depression 04/20/2012    Giuliano Preece, PTA 03/06/2021, 2:38 PM  [place] @ [address], [place], [address] Phone: [phone]   Fax:  [phone]  Name: Ellen [name] MRN: [id number] Date of Birth: 1995/03/18

## 2021-03-08 ENCOUNTER — Ambulatory Visit: Payer: Medicaid Other | Admitting: Physical Therapy

## 2021-03-11 ENCOUNTER — Encounter: Payer: Self-pay | Admitting: Adult Health

## 2021-03-12 ENCOUNTER — Inpatient Hospital Stay: Payer: Medicaid Other

## 2021-03-12 ENCOUNTER — Inpatient Hospital Stay (HOSPITAL_BASED_OUTPATIENT_CLINIC_OR_DEPARTMENT_OTHER): Payer: Medicaid Other | Admitting: Adult Health

## 2021-03-12 ENCOUNTER — Other Ambulatory Visit: Payer: Self-pay

## 2021-03-12 ENCOUNTER — Inpatient Hospital Stay (HOSPITAL_BASED_OUTPATIENT_CLINIC_OR_DEPARTMENT_OTHER): Payer: Medicaid Other

## 2021-03-12 ENCOUNTER — Encounter: Payer: Self-pay | Admitting: General Practice

## 2021-03-12 ENCOUNTER — Encounter: Payer: Self-pay | Admitting: Adult Health

## 2021-03-12 VITALS — BP 131/75 | HR 98 | Temp 97.6°F | Resp 17 | Wt 139.5 lb

## 2021-03-12 DIAGNOSIS — C7951 Secondary malignant neoplasm of bone: Secondary | ICD-10-CM | POA: Diagnosis not present

## 2021-03-12 DIAGNOSIS — C50919 Malignant neoplasm of unspecified site of unspecified female breast: Secondary | ICD-10-CM | POA: Diagnosis not present

## 2021-03-12 DIAGNOSIS — Z5112 Encounter for antineoplastic immunotherapy: Secondary | ICD-10-CM | POA: Diagnosis not present

## 2021-03-12 DIAGNOSIS — C50912 Malignant neoplasm of unspecified site of left female breast: Secondary | ICD-10-CM

## 2021-03-12 DIAGNOSIS — Z95828 Presence of other vascular implants and grafts: Secondary | ICD-10-CM

## 2021-03-12 LAB — CMP (CANCER CENTER ONLY)
ALT: 15 U/L (ref 0–44)
AST: 13 U/L — ABNORMAL LOW (ref 15–41)
Albumin: 3.9 g/dL (ref 3.5–5.0)
Alkaline Phosphatase: 70 U/L (ref 38–126)
Anion gap: 4 — ABNORMAL LOW (ref 5–15)
BUN: 11 mg/dL (ref 6–20)
CO2: 26 mmol/L (ref 22–32)
Calcium: 9.1 mg/dL (ref 8.9–10.3)
Chloride: 110 mmol/L (ref 98–111)
Creatinine: 0.68 mg/dL (ref 0.44–1.00)
GFR, Estimated: >60 mL/min (ref >60)
Glucose, Bld: 116 mg/dL — ABNORMAL HIGH (ref 70–99)
Potassium: 4.4 mmol/L (ref 3.5–5.1)
Sodium: 140 mmol/L (ref 135–145)
Total Bilirubin: 0.5 mg/dL (ref 0.3–1.2)
Total Protein: 6.5 g/dL (ref 6.5–8.1)

## 2021-03-12 LAB — CBC WITH DIFFERENTIAL (CANCER CENTER ONLY)
Abs Immature Granulocytes: 0.01 10*3/uL (ref 0.00–0.07)
Basophils Absolute: 0 10*3/uL (ref 0.0–0.1)
Basophils Relative: 0 %
Eosinophils Absolute: 0.1 10*3/uL (ref 0.0–0.5)
Eosinophils Relative: 2 %
HCT: 34.3 % — ABNORMAL LOW (ref 36.0–46.0)
Hemoglobin: 11.2 g/dL — ABNORMAL LOW (ref 12.0–15.0)
Immature Granulocytes: 0 %
Lymphocytes Relative: 21 %
Lymphs Abs: 1 10*3/uL (ref 0.7–4.0)
MCH: 28.6 pg (ref 26.0–34.0)
MCHC: 32.7 g/dL (ref 30.0–36.0)
MCV: 87.7 fL (ref 80.0–100.0)
Monocytes Absolute: 0.5 10*3/uL (ref 0.1–1.0)
Monocytes Relative: 9 %
Neutro Abs: 3.4 10*3/uL (ref 1.7–7.7)
Neutrophils Relative %: 68 %
Platelet Count: 252 10*3/uL (ref 150–400)
RBC: 3.91 MIL/uL (ref 3.87–5.11)
RDW: 15.5 % (ref 11.5–15.5)
WBC Count: 5 10*3/uL (ref 4.0–10.5)
nRBC: 0 % (ref 0.0–0.2)

## 2021-03-12 MED ORDER — GOSERELIN ACETATE 3.6 MG ~~LOC~~ IMPL
3.6000 mg | DRUG_IMPLANT | Freq: Once | SUBCUTANEOUS | Status: AC
  Administered 2021-03-12: 13:00:00 3.6 mg via SUBCUTANEOUS
  Filled 2021-03-12: qty 3.6

## 2021-03-12 MED ORDER — SODIUM CHLORIDE 0.9 % IV SOLN
420.0000 mg | Freq: Once | INTRAVENOUS | Status: AC
  Administered 2021-03-12: 12:00:00 420 mg via INTRAVENOUS
  Filled 2021-03-12: qty 14

## 2021-03-12 MED ORDER — SODIUM CHLORIDE 0.9% FLUSH
10.0000 mL | INTRAVENOUS | Status: DC | PRN
  Administered 2021-03-12: 13:00:00 10 mL

## 2021-03-12 MED ORDER — HEPARIN SOD (PORK) LOCK FLUSH 100 UNIT/ML IV SOLN
500.0000 [IU] | Freq: Once | INTRAVENOUS | Status: AC | PRN
  Administered 2021-03-12: 13:00:00 500 [IU]

## 2021-03-12 MED ORDER — ACETAMINOPHEN 325 MG PO TABS
650.0000 mg | ORAL_TABLET | Freq: Once | ORAL | Status: AC
  Administered 2021-03-12: 11:00:00 650 mg via ORAL
  Filled 2021-03-12: qty 2

## 2021-03-12 MED ORDER — SODIUM CHLORIDE 0.9 % IV SOLN: Freq: Once | INTRAVENOUS | Status: AC

## 2021-03-12 MED ORDER — SODIUM CHLORIDE 0.9% FLUSH
10.0000 mL | INTRAVENOUS | Status: AC | PRN
  Administered 2021-03-12: 10:00:00 10 mL

## 2021-03-12 MED ORDER — DIPHENHYDRAMINE HCL 25 MG PO CAPS
25.0000 mg | ORAL_CAPSULE | Freq: Once | ORAL | Status: AC
  Administered 2021-03-12: 11:00:00 25 mg via ORAL
  Filled 2021-03-12: qty 1

## 2021-03-12 MED ORDER — TRASTUZUMAB-DKST CHEMO 150 MG IV SOLR
6.0000 mg/kg | Freq: Once | INTRAVENOUS | Status: AC
  Administered 2021-03-12: 11:00:00 378 mg via INTRAVENOUS
  Filled 2021-03-12: qty 18

## 2021-03-12 NOTE — Assessment & Plan Note (Signed)
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 Weight loss:Her weight is stable Diarrhea: Resolved  We will restage with CT chest abdomen and pelvis, she will see Dr. Lindi Adie in February for her next treatment.

## 2021-03-12 NOTE — Progress Notes (Signed)
Ellen [name] Follow up:    Pcp, No No address on file   DIAGNOSIS:  [name] Staging  Carcinoma of breast metastatic to bone [facility]) Staging form: Breast, AJCC 8th Edition - Clinical stage from 09/03/2020: Stage IV (cT4b, cN1, cM1, G2, ER+, PR+, HER2+) - Signed by Gardenia Phlegm, NP on 10/03/2020 Stage prefix: Initial diagnosis Method of lymph node assessment: Clinical Histologic grading system: 3 grade system   SUMMARY OF ONCOLOGIC HISTORY: Oncology History  Metastatic breast [name] ([place])  08/15/2020 Imaging   Large mass in the medial left breast measuring 3.5 cm.  Prominent left axillary lymph node cluster of adjacent lymph nodes, irregular nodule right middle lobe 1.1 cm, aggressive lesion right second rib cortical destruction, multiple lucent lesions throughout the thoracic spine including T1 vertebral body and T2, irregular multilobar lesion central left hepatic lobe 4.3 x 4.7 cm.  Several satellite lesions in the left lateral hepatic lobe.  Multiple sclerotic lesions in the bones iliac wings, acetabular, medial iliac bones, sacrum multiple lesions lumbar spine L3-L4 and L5   08/17/2020 Initial Diagnosis   Biopsy revealed IDC with DCIS grade 2, ER 30%, PR 20%, HER2 equivocal by IHC, FISH positive, Ki-67 25%, lymph node positive    Genetic Testing   Negative genetic testing:  No pathogenic variants detected on the [employer] CancerNext-Expanded + RNAinsight panel. The report date is 09/06/2020.   The CancerNext-Expanded + RNAinsight gene panel offered by [employer] and includes sequencing and rearrangement analysis for the following 77 genes: AIP, ALK, APC, ATM, AXIN2, BAP1, BARD1, BLM, BMPR1A, BRCA1, BRCA2, BRIP1, CDC73, CDH1, CDK4, CDKN1B, CDKN2A, CHEK2, CTNNA1, DICER1, FANCC, FH, FLCN, GALNT12, KIF1B, LZTR1, MAX, MEN1, MET, MLH1, MSH2, MSH3, MSH6, MUTYH, NBN, NF1, NF2, NTHL1, PALB2, PHOX2B, PMS2, POT1, PRKAR1A, PTCH1, PTEN, RAD51C, RAD51D, RB1, RECQL, RET, SDHA,  SDHAF2, SDHB, SDHC, SDHD, SMAD4, SMARCA4, SMARCB1, SMARCE1, STK11, SUFU, TMEM127, TP53, TSC1, TSC2, VHL and XRCC2 (sequencing and deletion/duplication); EGFR, EGLN1, HOXB13, KIT, MITF, PDGFRA, POLD1 and POLE (sequencing only); EPCAM and GREM1 (deletion/duplication only). RNA data is routinely analyzed for use in variant interpretation for all genes.   Carcinoma of breast metastatic to bone ([place])  08/17/2020 Initial Diagnosis   Carcinoma of breast metastatic to bone [facility]); Goserelin/Zoladex started and to be given every 4 weeks    Genetic Testing   Negative genetic testing:  No pathogenic variants detected on the [employer] CancerNext-Expanded + RNAinsight panel. The report date is 09/06/2020.   The CancerNext-Expanded + RNAinsight gene panel offered by [employer] and includes sequencing and rearrangement analysis for the following 77 genes: AIP, ALK, APC, ATM, AXIN2, BAP1, BARD1, BLM, BMPR1A, BRCA1, BRCA2, BRIP1, CDC73, CDH1, CDK4, CDKN1B, CDKN2A, CHEK2, CTNNA1, DICER1, FANCC, FH, FLCN, GALNT12, KIF1B, LZTR1, MAX, MEN1, MET, MLH1, MSH2, MSH3, MSH6, MUTYH, NBN, NF1, NF2, NTHL1, PALB2, PHOX2B, PMS2, POT1, PRKAR1A, PTCH1, PTEN, RAD51C, RAD51D, RB1, RECQL, RET, SDHA, SDHAF2, SDHB, SDHC, SDHD, SMAD4, SMARCA4, SMARCB1, SMARCE1, STK11, SUFU, TMEM127, TP53, TSC1, TSC2, VHL and XRCC2 (sequencing and deletion/duplication); EGFR, EGLN1, HOXB13, KIT, MITF, PDGFRA, POLD1 and POLE (sequencing only); EPCAM and GREM1 (deletion/duplication only). RNA data is routinely analyzed for use in variant interpretation for all genes.   08/18/2020 - 08/30/2020 Radiation Therapy   Palliative radiation to the lumbar and pelvic bone; 30 Gy in 10 fractions   08/31/2020 -  Adjuvant Chemotherapy   Started with Herceptin/Perjeta on 08/31/2020 and Taxotere on 09/04/2020; she was hospitalized with dehydration and refractory diarrhea for 11 days, she resumed treatment with Taxotere (  dose reduced by 55m/m2) and Herceptin only (perjeta  discontinued) on 09/26/2020, reintroduced 10/17/2020   09/03/2020 [name] Staging   Staging form: Breast, AJCC 8th Edition - Clinical stage from 09/03/2020: Stage IV (cT4b, cN1, cM1, G2, ER+, PR+, HER2+) - Signed by CGardenia Phlegm NP on 10/03/2020 Stage prefix: Initial diagnosis Method of lymph node assessment: Clinical Histologic grading system: 3 grade system      CURRENT THERAPY: Herceptin, Perjeta, Zoladex, Anastrozole  INTERVAL HISTORY: Ellen LIBBEY89y.o. female returns for relation of her metastatic breast [name].  She continues on treatment with Herceptin and Perjeta Zoladex and anastrozole.  She is tolerating these well.  She asked to see me today because she has noticed some significant pain in her left fifth toe which has caused her on some occasions to limp due to the pain.  She says that it was red and swollen and appeared that it may be infected.  This morning however she woke up and the pain was about 75% resolved.  She denies any new issues with fevers chills or other concerns.   Patient Active Problem List   Diagnosis Date Noted   AKI ([place] kidney [place]) ([place] 09/09/2020   Genetic testing 09/06/2020   Family history of thyroid [name] 08/29/2020   Family history of pancreatic [name] 08/29/2020   Carcin[place] of breast [place] to bone ([place] 08/17/2020   Metastatic breast [name] ([place] 08/16/2020   Left breast mass 08/13/2020   Genital herpes 08/13/2020   Attention deficit hyperactivity disorder (ADHD), 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   GAD (generalized anxiety disorder) 02/16/2017   Marijuana user 02/16/2017   Mild intermittent asthma without complication 162/83/6629  Severe episode of recurrent major depressive disorder, without psychotic features ([place] 02/16/2017   Depression 04/20/2012    is allergic to other.  MEDICAL HISTORY: Past Medical History:  Diagnosis Date   Anxiety    Asthma    Exercise Induced   [name] ([place]     Depression    Family history of pancreatic [name]    Family history of thyroid [name]    Herpes simplex     SURGICAL HISTORY: Past Surgical History:  Procedure Laterality Date   NO PAST SURGERIES     PORTACATH PLACEMENT Right 08/23/2020   Procedure: INSERTION PORT-A-CATH;  Surgeon: TDonnie Mesa MD;  [facility]  Service: General;  Laterality: Right;    SOCIAL HISTORY: Social History   Socioeconomic History   Marital status: Single    Spouse name: Not on file   Number of children: Not on file   Years of education: Not on file   Highest education level: Not on file  Occupational History   Not on file  Tobacco Use   Smoking status: Never   Smokeless tobacco: Never  Vaping Use   Vaping Use: Every day   Substances: THC  Substance and Sexual Activity   Alcohol use: Yes    Alcohol/week: 7.0 standard drinks    Types: 7 Glasses of wine per week   Drug use: Yes    Types: Marijuana   Sexual activity: Yes    Birth control/protection: I.U.D.  Other Topics Concern   Not on file  Social History Narrative   Menarche at age 26 has a cycles every 28 days that lasts about 11 days, 4 days of spotting, two days of heavy flow, and the other days mild to moderate flow.  Received Depo provera shot x 3 years, Nexplanon x 2 years, and currently  has kyleena IUD.  No OCPs.  She has never been pregnant.    Social Determinants of Health   Financial Resource Strain: Not on file  Food Insecurity: No Food Insecurity   Worried About Charity fundraiser in the Last Year: Never true   Ran Out of Food in the Last Year: Never true  Transportation Needs: No Transportation Needs   Lack of Transportation (Medical): No   Lack of Transportation (Non-Medical): No  Physical Activity: Not on file  Stress: Not on file  Social Connections: Not on file  Intimate Partner Violence: Not on file    FAMILY HISTORY: Family History  Problem Relation Age of Onset   Depression Mother     Hyperlipidemia Mother    Thyroid [name] Mother 44       papillary thyroid [name]   Hypertension Father    Atrial fibrillation Father    Irritable bowel syndrome Father        also brother   ADD / ADHD Brother    Bipolar disorder Brother    Diabetes Maternal Grandfather    Heart attack Paternal Great-grandfather 39   Pancreatic [name] Maternal Great-grandfather 88       (MGM's father)    Review of Systems  Constitutional:  Negative for appetite change, chills, fatigue, fever and unexpected weight change.  HENT:   Negative for hearing loss, lump/mass and trouble swallowing.   Eyes:  Negative for eye problems and icterus.  Respiratory:  Negative for chest tightness, cough and shortness of breath.   Cardiovascular:  Negative for chest pain, leg swelling and palpitations.  Gastrointestinal:  Negative for abdominal distention, abdominal pain, constipation, diarrhea, nausea and vomiting.  Endocrine: Negative for hot flashes.  Genitourinary:  Negative for difficulty urinating.   Musculoskeletal:  Negative for arthralgias.  Skin:  Negative for itching and rash.  Neurological:  Negative for dizziness, extremity weakness, headaches and numbness.  Hematological:  Negative for adenopathy. Does not bruise/bleed easily.  Psychiatric/Behavioral:  Negative for depression. The patient is not nervous/anxious.      PHYSICAL EXAMINATION  ECOG PERFORMANCE STATUS: 1 - Symptomatic but completely ambulatory  There were no vitals filed for this visit.  Physical Exam Constitutional:      General: She is not in acute distress.    Appearance: Normal appearance. She is not toxic-appearing.  HENT:     Head: Normocephalic and atraumatic.  Eyes:     General: No scleral icterus. Cardiovascular:     Rate and Rhythm: Normal rate and regular rhythm.     Pulses: Normal pulses.     Heart sounds: Normal heart sounds.  Pulmonary:     Effort: Pulmonary effort is normal.     Breath sounds: Normal breath  sounds.  Abdominal:     General: Abdomen is flat. Bowel sounds are normal. There is no distension.     Palpations: Abdomen is soft.     Tenderness: There is no abdominal tenderness.  Musculoskeletal:        General: No swelling.     Cervical back: Neck supple.  Lymphadenopathy:     Cervical: No cervical adenopathy.  Skin:    General: Skin is warm and dry.     Findings: No rash.     Comments: Left fifth toe with slight erythema and swelling.  Skin is intact  Neurological:     General: No focal deficit present.     Mental Status: She is alert.  Psychiatric:  Mood and Affect: Mood normal.        Behavior: Behavior normal.    LABORATORY DATA:  CBC    Component Value Date/Time   WBC 5.0 03/12/2021 0955   WBC 5.7 09/19/2020 0337   RBC 3.91 03/12/2021 0955   HGB 11.2 (L) 03/12/2021 0955   HCT 34.3 (L) 03/12/2021 0955   PLT 252 03/12/2021 0955   MCV 87.7 03/12/2021 0955   MCH 28.6 03/12/2021 0955   MCHC 32.7 03/12/2021 0955   RDW 15.5 03/12/2021 0955   LYMPHSABS 1.0 03/12/2021 0955   MONOABS 0.5 03/12/2021 0955   EOSABS 0.1 03/12/2021 0955   BASOSABS 0.0 03/12/2021 0955    CMP     Component Value Date/Time   NA 140 03/12/2021 0955   K 4.4 03/12/2021 0955   CL 110 03/12/2021 0955   CO2 26 03/12/2021 0955   GLUCOSE 116 (H) 03/12/2021 0955   BUN 11 03/12/2021 0955   CREATININE 0.68 03/12/2021 0955   CALCIUM 9.1 03/12/2021 0955   PROT 6.5 03/12/2021 0955   ALBUMIN 3.9 03/12/2021 0955   AST 13 (L) 03/12/2021 0955   ALT 15 03/12/2021 0955   ALKPHOS 70 03/12/2021 0955   BILITOT 0.5 03/12/2021 0955   GFRNONAA >60 03/12/2021 0955      ASSESSMENT and THERAPY PLAN:   Carcinoma of breast metastatic to bone ([place]) 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, FISH positive ratio 2.8, Ki-67 25%,  lymph node positive   Palliative radiation completed 09/03/2020 Taxotere/Herceptin/Perjeta 08/31/2020-12/18/2020 Herceptin Perjeta Maintenance  beginning 01/08/2021  Treatment plan:  Herceptin Perjeta maintenance  ------------------------------------------------------------------------------------------------------------------------------------ Current treatment:  Herceptin Perjeta, Zoladex, and anastrozole Toxicities: Depression/ADHD: She is establishing with primary care for management of this. Weight loss: Her weight is stable Diarrhea: Resolved   We will restage with CT chest abdomen and pelvis, she will see Dr. Lindi Adie in February for her next treatment.   All questions were answered. The patient knows to call the clinic with any problems, questions or concerns. We can certainly see the patient much sooner if necessary.  Total encounter time: 30 minutes in face-to-face visit time, chart review, lab review, care coordination, discussion with Dr. Lindi Adie about the above, and documentation of the encounter.  Wilber Bihari, NP 03/12/21 11:27 AM Medical Oncology and Hematology [facility] [place], [place] [address] Tel. [phone]    Fax. [phone]  *Total Encounter Time as defined by the Centers for Medicare and Medicaid Services includes, in addition to the face-to-face time of a patient visit (documented in the note above) non-face-to-face time: obtaining and reviewing outside history, ordering and reviewing medications, tests or procedures, care coordination (communications with other health care professionals or caregivers) and documentation in the medical record.

## 2021-03-12 NOTE — Patient Instructions (Addendum)
[facility] ONCOLOG[facility]  Discharge Instructions: Thank you for choosing [facility] to provide your oncology and hematology care.   If you have a lab appointment with the [facility], please go directly to the [facility] and check in at the registration area.   Wear comfortable clothing and clothing appropriate for easy access to any Portacath or PICC line.   We strive to give you quality time with your provider. You may need to reschedule your appointment if you arrive late (15 or more minutes).  Arriving late affects you and other patients whose appointments are after yours.  Also, if you miss three or more appointments without notifying the office, you may be dismissed from the clinic at the providers discretion.      For prescription refill requests, have your pharmacy contact our office and allow 72 hours for refills to be completed.    Today you received the following chemotherapy and/or immunotherapy agents Herceptin, Perjeta,  Zoladex   To help prevent nausea and vomiting after your treatment, we encourage you to take your nausea medication as directed.  BELOW ARE SYMPTOMS THAT SHOULD BE REPORTED IMMEDIATELY: *FEVER GREATER THAN 100.4 F (38 C) OR HIGHER *CHILLS OR SWEATING *NAUSEA AND VOMITING THAT IS NOT CONTROLLED WITH YOUR NAUSEA MEDICATION *UNUSUAL SHORTNESS OF BREATH *UNUSUAL BRUISING OR BLEEDING *URINARY PROBLEMS (pain or burning when urinating, or frequent urination) *BOWEL PROBLEMS (unusual diarrhea, constipation, pain near the anus) TENDERNESS IN MOUTH AND THROAT WITH OR WITHOUT PRESENCE OF ULCERS (sore throat, sores in mouth, or a toothache) UNUSUAL RASH, SWELLING OR PAIN  UNUSUAL VAGINAL DISCHARGE OR ITCHING   Items with * indicate a potential emergency and should be followed up as soon as possible or go to the Emergency Department if any problems should occur.  Please show the CHEMOTHERAPY ALERT CARD or IMMUNOTHERAPY ALERT CARD  at check-in to the Emergency Department and triage nurse.  Should you have questions after your visit or need to cancel or reschedule your appointment, please contact [facility]  Dept: [phone]  and follow the prompts.  Office hours are 8:00 a.m. to 4:30 p.m. Monday - Friday. Please note that voicemails left after 4:00 p.m. may not be returned until the following business day.  We are closed weekends and major holidays. You have access to a nurse at all times for urgent questions. Please call the main number to the clinic Dept: [phone] and follow the prompts.   For any non-urgent questions, you may also contact your provider using MyChart. We now offer e-Visits for anyone 67 and older to request care online for non-urgent symptoms. For details visit [email or web address].   Also download the MyChart app! Go to the app store, search "MyChart", open the app, select [facility], and log in with your MyChart username and password.  Due to Covid, a mask is required upon entering the hospital/clinic. If you do not have a mask, one will be given to you upon arrival. For doctor visits, patients may have 1 support person aged 52 or older with them. For treatment visits, patients cannot have anyone with them due to current Covid guidelines and our immunocompromised population.

## 2021-03-12 NOTE — Progress Notes (Signed)
[facility] Spiritual Care Note  Followed up with Ellen [name] in infusion, providing opportunity for her to share and process updates. Her exciting news is that she has bought a house in the [place] area! [name] is also excited that her sister and baby are coming to visit in April. She is continuing to work on practicing a positive attitude and using perspective to cope. Her energy varies, but she is mostly staying upbeat. We plan to follow up at a future treatment.   [place], [place], [facility] Pager [phone] Voicemail [phone]

## 2021-03-13 ENCOUNTER — Encounter: Payer: Self-pay | Admitting: Physical Therapy

## 2021-03-13 ENCOUNTER — Ambulatory Visit: Payer: Medicaid Other | Admitting: Physical Therapy

## 2021-03-13 DIAGNOSIS — C50919 Malignant neoplasm of unspecified site of unspecified female breast: Secondary | ICD-10-CM

## 2021-03-13 NOTE — Patient Instructions (Signed)
Access Code: [facility] URL: [email or web address] Date: 03/13/2021 Prepared by: Earlie Counts  Program Notes stand at table, press forearm into ball on the table for 3 sec then do the same on the other side. 5-10 times each side    Exercises Supine Transversus Abdominis Bracing - Hands on Stomach - 2 x daily - 7 x weekly - 1 sets - 3 reps Supine Transversus Abdominis Bracing with Double Leg Fallout - 2 x daily - 7 x weekly - 1 sets - 3 reps Supine March - 1 x daily - 7 x weekly - 1 sets - 5 reps Supine Shoulder Flexion AAROM with Dowel - 1 x daily - 3 x weekly - 3 sets - 10 reps Hooklying Isometric Hip Flexion - 1 x daily - 3 x weekly - 1 sets - 10 reps Supine Shoulder Overhead Flexion with Medicine Ball - 1 x daily - 3 x weekly - 1 sets - 10 reps Seated Hamstring Stretch - 1 x daily - 3 x weekly - 1 sets - 2 reps - 30 sec hold [facility] [address], [place], [place] [address] Phone # [phone] Fax [phone]

## 2021-03-13 NOTE — Therapy (Signed)
[facility] @ [address] [place], [place], [address] Phone: [phone]   Fax:  [phone]  Physical Therapy Treatment  Patient Details  Name: Ellen [name] MRN: [id number] Date of Birth: May 02, 1995 Referring Provider (PT): Wilber Bihari NP   Encounter Date: 03/13/2021   PT End of Session - 03/13/21 1508     Visit Number 3    Number of Visits 13    Date for PT Re-Evaluation 04/09/21    Authorization Type medicaid no auth needed    Authorization - Visit Number 3    Authorization - Number of Visits 27    PT Start Time 6720    PT Stop Time 1500    PT Time Calculation (min) 40 min    Activity Tolerance Patient tolerated treatment well;No increased pain    Behavior During Therapy WFL for tasks assessed/performed             Past Medical History:  Diagnosis Date   Anxiety    Asthma    Exercise Induced   Cancer ([place])    Depression    Family history of pancreatic cancer    Family history of thyroid cancer    Herpes simplex     Past Surgical History:  Procedure Laterality Date   NO PAST SURGERIES     PORTACATH PLACEMENT Right 08/23/2020   Procedure: INSERTION PORT-A-CATH;  Surgeon: [name], MD;  Location: [facility];  Service: General;  Laterality: Right;    There were no vitals filed for this visit.   Subjective Assessment - 03/13/21 1424     Subjective I am doing okay. I felt good after last visit.    Pertinent History 08/15/20: Discovery of Lt breast cancer, sclerotic lesions in ribs, thoracic spine, liver, iliac wings, acetabulur, iliac, and sacral bones, and lumbar spine. Completed palliative radiation to lumbar spine and pelvis. Chemotherapy started Herceptin and Perjeta maintenance every 3 weeks. Compression fractures L3-5 with canal stenosis    Patient Stated Goals am I doing things correctly?    Currently in Pain? 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    Leisure reports being normally active but not currently since diagnosis and hospitalization      Cognition   Overall Cognitive Status Within Functional Limits for tasks assessed                           [facility] Adult PT Treatment/Exercise - 03/13/21 0001       Lumbar Exercises: Aerobic   Nustep no arms; just legs; level 4 for 6 minutes while assessing patient      Lumbar Exercises: Supine   Ab Set 10 reps;5 seconds    Clam 10 reps;1 second  left, right with breath   Clam Limitations each side with core engaged    Isometric Hip Flexion 10 reps;5 seconds   right, left   Isometric Hip Flexion Limitations wit hball and engage the core    Other Supine Lumbar Exercises supine shoulder flexion with circles, left goies 1/2 way and right goes all the way                     PT Education - 03/13/21 1507     Education Details Access Code: [facility]; educated patient to not put resistance on the band or ball. just gentle pressure to be like an isometric    Person(s) Educated Patient    Methods Explanation;Demonstration;Verbal cues;Handout    Comprehension Returned demonstration;Verbalized understanding                 PT Long Term Goals - 02/26/21 2146       PT LONG TERM GOAL #1   Title Pt will be educated on safe exercise and functional mobility living with bony mets    Time 6    Period Weeks    Status New      PT  LONG TERM GOAL #2   Title Pt will report improvement in strength with ADLs by at least 50%    Time 6    Period Weeks    Status New      PT LONG TERM GOAL #3   Title Pt will be ind with final HEP    Time 6    Period Weeks    Status New                   Plan - 03/13/21 1444     Clinical Impression Statement Patient is learning how to breath with her exercises so it does not feel like she is holding her breath. Patient was keeping spinal neutral with her HEP. She will shake with her exercises due to weakness. Patient understands the yellow band around her hands should be like an isometric contraction not stretching the band. Patient was able to do the nustep with low tension. Patient had no pain with her exercises. She is not able to do full circles or flexion with the left shoulder due to discomfort. Patient will benefit from skilled therapy to guide her in her exercise program and to make sure she is keeping spinal neutral.    Personal Factors and Comorbidities Comorbidity 2    Comorbidities active cancer, bony mets    Examination-Activity Limitations Squat;Stairs    Stability/Clinical Decision Making Evolving/Moderate complexity    Rehab Potential Good    PT Frequency 2x / week    PT Duration 6 weeks    PT Treatment/Interventions Aquatic Therapy;Dry needling;Therapeutic activities;Therapeutic exercise;Manual techniques    PT Next Visit Plan aquatics alternating with land based core isometic activities and education to maintain endurance and mobility *no added resistance, avoid joint mob/PROM/spinal rotation/bridging, monitor and stop activities with increasing functional pain    PT Home Exercise Plan Access Code: [facility]    Recommended Other Services MD signed initial note    Consulted and Agree with Plan of Care Patient             Patient will benefit from skilled therapeutic intervention in order to improve the following deficits and impairments:  Decreased range of  motion, Pain, Decreased knowledge of precautions  Visit Diagnosis: Carcinoma of breast metastatic to bone, unspecified laterality ([place])  Problem List Patient Active Problem List   Diagnosis Date Noted   AKI (acute kidney injury) ([place]) 09/09/2020   Genetic testing 09/06/2020   Family history of thyroid cancer 08/29/2020   Family history of pancreatic cancer 08/29/2020   Carcinoma of breast metastatic to bone ([place]) 08/17/2020   Metastatic breast cancer ([place]) 08/16/2020   Left breast mass 08/13/2020   Genital herpes 08/13/2020   Attention deficit hyperactivity disorder (ADHD), 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   GAD (generalized anxiety disorder) 02/16/2017   Marijuana user 02/16/2017   Mild intermittent asthma without complication 27/08/8673   Severe episode of recurrent major depressive disorder, without psychotic features ([place]) 02/16/2017   Depression 04/20/2012    Earlie Counts, PT 03/13/21 3:18 PM  [facility] @ [address] [place], [place], [address] Phone: [phone]   Fax:  [phone]  Name: Ellen [name] MRN: [id number] Date of Birth: 1995/12/08

## 2021-03-15 ENCOUNTER — Ambulatory Visit: Payer: Medicaid Other | Admitting: Physical Therapy

## 2021-03-15 ENCOUNTER — Encounter: Payer: Self-pay | Admitting: Hematology and Oncology

## 2021-03-18 ENCOUNTER — Encounter: Payer: Self-pay | Admitting: Hematology and Oncology

## 2021-03-19 ENCOUNTER — Other Ambulatory Visit: Payer: Self-pay

## 2021-03-19 ENCOUNTER — Encounter: Payer: Self-pay | Admitting: Hematology and Oncology

## 2021-03-19 ENCOUNTER — Ambulatory Visit (HOSPITAL_COMMUNITY)
Admission: RE | Admit: 2021-03-19 | Discharge: 2021-03-19 | Disposition: A | Discharge status: Home / Self Care | Payer: Medicaid Other | Source: Ambulatory Visit | Attending: Adult Health | Admitting: Adult Health

## 2021-03-19 DIAGNOSIS — C7951 Secondary malignant neoplasm of bone: Secondary | ICD-10-CM | POA: Diagnosis not present

## 2021-03-19 DIAGNOSIS — C50919 Malignant neoplasm of unspecified site of unspecified female breast: Secondary | ICD-10-CM | POA: Diagnosis not present

## 2021-03-19 DIAGNOSIS — R911 Solitary pulmonary nodule: Secondary | ICD-10-CM | POA: Diagnosis not present

## 2021-03-19 DIAGNOSIS — C801 Malignant (primary) neoplasm, unspecified: Secondary | ICD-10-CM | POA: Diagnosis not present

## 2021-03-19 MED ORDER — IOHEXOL 300 MG/ML  SOLN
100.0000 mL | Freq: Once | INTRAMUSCULAR | Status: AC | PRN
  Administered 2021-03-19: 100 mL via INTRAVENOUS

## 2021-03-20 ENCOUNTER — Ambulatory Visit: Payer: Medicaid Other | Admitting: Podiatry

## 2021-03-20 ENCOUNTER — Encounter: Payer: Medicaid Other | Admitting: Physical Therapy

## 2021-03-20 DIAGNOSIS — L603 Nail dystrophy: Secondary | ICD-10-CM | POA: Diagnosis not present

## 2021-03-20 NOTE — Progress Notes (Signed)
° °  HPI: 26 y.o. female presenting today for concern of abnormal nail growth to the medial border of the right hallux nail plate.  This is been ongoing for about 4-5 years.  She says it is not painful however she does get a portion of thick callus skin that can catch on her socks.  She does clip sit down and has no issues with the nail plate.  She would like to have it evaluated however.  She is concerned for possible ingrown toenail.  She has not anything for treatment.  Past Medical History:  Diagnosis Date   Anxiety    Asthma    Exercise Induced   Cancer ([place])    Depression    Family history of pancreatic cancer    Family history of thyroid cancer    Herpes simplex     Past Surgical History:  Procedure Laterality Date   NO PAST SURGERIES     PORTACATH PLACEMENT Right 08/23/2020   Procedure: INSERTION PORT-A-CATH;  Surgeon: [name], MD;  Location: [facility];  Service: General;  Laterality: Right;    Allergies  Allergen Reactions   Other Other (See Comments)    Pt states that she is allergic to the fabric adhesives; It tears her skin.     Physical Exam: General: The patient is alert and oriented x3 in no acute distress.  Dermatology: The toenail plate to the right hallux is completely normal with healthy growth with exception to the medial border of the hallux nail plate.  It appears that the nail plate is absent to this portion of the nail with callused subungual tissue noted.  No associated tenderness to palpation  Vascular: Palpable pedal pulses bilaterally. Capillary refill within normal limits.  Negative for any significant edema or erythema  Neurological: Light touch and protective threshold grossly intact  Musculoskeletal Exam: No pedal deformities noted   Assessment: 1.  Dystrophic portion of nail medial border right hallux nail plate   Plan of Care:  1. Patient evaluated. 2.  Recommend conservative treatment for now.  Continue clipping the  extra portion of callus skin as needed.  Fortunately the nail plate does not cause any pain or tenderness.  Recommend simple observation for now 3.  Return to clinic as needed      Edrick Kins, DPM [facility]  Dr. Edrick Kins, DPM    [address], [place] [address]                Office [phone]  Fax [phone]

## 2021-03-22 ENCOUNTER — Ambulatory Visit: Payer: Medicaid Other | Admitting: Physical Therapy

## 2021-03-26 ENCOUNTER — Ambulatory Visit (HOSPITAL_COMMUNITY)
Admission: RE | Admit: 2021-03-26 | Discharge: 2021-03-26 | Disposition: A | Discharge status: Home / Self Care | Payer: Medicaid Other | Source: Ambulatory Visit | Attending: Adult Health | Admitting: Adult Health

## 2021-03-26 ENCOUNTER — Other Ambulatory Visit: Payer: Self-pay

## 2021-03-26 DIAGNOSIS — Z0189 Encounter for other specified special examinations: Secondary | ICD-10-CM | POA: Diagnosis not present

## 2021-03-26 DIAGNOSIS — Z01818 Encounter for other preprocedural examination: Secondary | ICD-10-CM | POA: Diagnosis not present

## 2021-03-26 DIAGNOSIS — C7951 Secondary malignant neoplasm of bone: Secondary | ICD-10-CM | POA: Diagnosis not present

## 2021-03-26 DIAGNOSIS — C50919 Malignant neoplasm of unspecified site of unspecified female breast: Secondary | ICD-10-CM

## 2021-03-26 LAB — ECHOCARDIOGRAM COMPLETE
AR max vel: 4.47 cm2
AV Peak grad: 7 mmHg
Ao pk vel: 1.33 m/s
Area-P 1/2: 4.49 cm2
S' Lateral: 2 cm

## 2021-03-26 NOTE — Progress Notes (Signed)
Echocardiogram 2D Echocardiogram has been performed.  Ellen [name] 03/26/2021, 10:50 AM

## 2021-03-27 ENCOUNTER — Ambulatory Visit: Payer: Medicaid Other | Admitting: Physical Therapy

## 2021-03-27 ENCOUNTER — Encounter: Payer: Medicaid Other | Admitting: Physical Therapy

## 2021-03-29 ENCOUNTER — Ambulatory Visit: Payer: Medicaid Other | Admitting: Physical Therapy

## 2021-04-01 NOTE — Progress Notes (Signed)
Patient Care Team: Pcp, No as PCP - General Ellen Lose, MD as Consulting Physician (Hematology and Oncology) [name], [name], NP as Nurse Practitioner (Hematology and Oncology) [name], MD as Consulting Physician (General Surgery) Mauro Kaufmann, RN as Oncology Nurse Navigator Rockwell Germany, RN as Oncology Nurse Navigator  DIAGNOSIS:    ICD-10-CM   1. Carcinoma of breast metastatic to bone, unspecified laterality ([place])  C50.919    C79.51       SUMMARY OF ONCOLOGIC HISTORY: Oncology History  Metastatic breast cancer ([place])  08/15/2020 Imaging   Large mass in the medial left breast measuring 3.5 cm.  Prominent left axillary lymph node cluster of adjacent lymph nodes, irregular nodule right middle lobe 1.1 cm, aggressive lesion right second rib cortical destruction, multiple lucent lesions throughout the thoracic spine including T1 vertebral body and T2, irregular multilobar lesion central left hepatic lobe 4.3 x 4.7 cm.  Several satellite lesions in the left lateral hepatic lobe.  Multiple sclerotic lesions in the bones iliac wings, acetabular, medial iliac bones, sacrum multiple lesions lumbar spine L3-L4 and L5   08/17/2020 Initial Diagnosis   Biopsy revealed IDC with DCIS grade 2, ER 30%, PR 20%, HER2 equivocal by IHC, FISH positive, Ki-67 25%, lymph node positive    Genetic Testing   Negative genetic testing:  No pathogenic variants detected on the [employer] CancerNext-Expanded + RNAinsight panel. The report date is 09/06/2020.   The CancerNext-Expanded + RNAinsight gene panel offered by [employer] and includes sequencing and rearrangement analysis for the following 77 genes: AIP, ALK, APC, ATM, AXIN2, BAP1, BARD1, BLM, BMPR1A, BRCA1, BRCA2, BRIP1, CDC73, CDH1, CDK4, CDKN1B, CDKN2A, CHEK2, CTNNA1, DICER1, FANCC, FH, FLCN, GALNT12, KIF1B, LZTR1, MAX, MEN1, MET, MLH1, MSH2, MSH3, MSH6, MUTYH, NBN, NF1, NF2, NTHL1, PALB2, PHOX2B, PMS2, POT1, PRKAR1A, PTCH1, PTEN,  RAD51C, RAD51D, RB1, RECQL, RET, SDHA, SDHAF2, SDHB, SDHC, SDHD, SMAD4, SMARCA4, SMARCB1, SMARCE1, STK11, SUFU, TMEM127, TP53, TSC1, TSC2, VHL and XRCC2 (sequencing and deletion/duplication); EGFR, EGLN1, HOXB13, KIT, MITF, PDGFRA, POLD1 and POLE (sequencing only); EPCAM and GREM1 (deletion/duplication only). RNA data is routinely analyzed for use in variant interpretation for all genes.   Carcinoma of breast metastatic to bone ([place])  08/17/2020 Initial Diagnosis   Carcinoma of breast metastatic to bone [facility]); Goserelin/Zoladex started and to be given every 4 weeks    Genetic Testing   Negative genetic testing:  No pathogenic variants detected on the [employer] CancerNext-Expanded + RNAinsight panel. The report date is 09/06/2020.   The CancerNext-Expanded + RNAinsight gene panel offered by [employer] and includes sequencing and rearrangement analysis for the following 77 genes: AIP, ALK, APC, ATM, AXIN2, BAP1, BARD1, BLM, BMPR1A, BRCA1, BRCA2, BRIP1, CDC73, CDH1, CDK4, CDKN1B, CDKN2A, CHEK2, CTNNA1, DICER1, FANCC, FH, FLCN, GALNT12, KIF1B, LZTR1, MAX, MEN1, MET, MLH1, MSH2, MSH3, MSH6, MUTYH, NBN, NF1, NF2, NTHL1, PALB2, PHOX2B, PMS2, POT1, PRKAR1A, PTCH1, PTEN, RAD51C, RAD51D, RB1, RECQL, RET, SDHA, SDHAF2, SDHB, SDHC, SDHD, SMAD4, SMARCA4, SMARCB1, SMARCE1, STK11, SUFU, TMEM127, TP53, TSC1, TSC2, VHL and XRCC2 (sequencing and deletion/duplication); EGFR, EGLN1, HOXB13, KIT, MITF, PDGFRA, POLD1 and POLE (sequencing only); EPCAM and GREM1 (deletion/duplication only). RNA data is routinely analyzed for use in variant interpretation for all genes.   08/18/2020 - 08/30/2020 Radiation Therapy   Palliative radiation to the lumbar and pelvic bone; 30 Gy in 10 fractions   08/31/2020 -  Adjuvant Chemotherapy   Started with Herceptin/Perjeta on 08/31/2020 and Taxotere on 09/04/2020; she was hospitalized with dehydration and refractory diarrhea for 11  days, she resumed treatment with Taxotere (dose reduced by 28m/m2)  and Herceptin only (perjeta discontinued) on 09/26/2020, reintroduced 10/17/2020   09/03/2020 Cancer Staging   Staging form: Breast, AJCC 8th Edition - Clinical stage from 09/03/2020: Stage IV (cT4b, cN1, cM1, G2, ER+, PR+, HER2+) - Signed by CGardenia Phlegm NP on 10/03/2020 Stage prefix: Initial diagnosis Method of lymph node assessment: Clinical Histologic grading system: 3 grade system      CHIEF COMPLIANT: Herceptin and Perjeta  INTERVAL HISTORY: Ellen MCCLEODis a 26y.o. with above-mentioned history of metastatic left breast cancer, currently on  Herceptin Perjeta. She reports to the clinic today for treatment.  She is tolerating the treatment extremely well without any problems.  Does not have any nausea vomiting diarrhea or constipation.  She does have injection site discomfort intermittently.  She is trying her best to use ice as well as Emla cream to numb the area before the injection.  ALLERGIES:  is allergic to other.  MEDICATIONS:  Current Outpatient Medications  Medication Sig Dispense Refill   acyclovir (ZOVIRAX) 800 MG tablet Take 1 tablet (800 mg total) by mouth 2 (two) times daily as needed (flare up). 60 tablet 5   acyclovir ointment (ZOVIRAX) 5 % Apply 1 application topically 4 (four) times daily as needed. outbreak     anastrozole (ARIMIDEX) 1 MG tablet Take 1 tablet (1 mg total) by mouth daily. 90 tablet 3   cholestyramine (QUESTRAN) 4 g packet Take 1 packet mixed with liquid by mouth 2 times daily as needed. 60 each 12   CONCERTA 36 MG CR tablet Take 36 mg by mouth every morning.     lidocaine-prilocaine (EMLA) cream Apply to affected area once 30 g 3   LORazepam (ATIVAN) 0.5 MG tablet Take 1 tablet (0.5 mg total) by mouth at bedtime as needed for sleep. 30 tablet 0   METROGEL 1 % gel Apply topically daily. 45 g 0   Multiple Vitamin (MULTIVITAMINS PO) Take 1 tablet by mouth daily.     ondansetron (ZOFRAN) 8 MG tablet Take 1 tablet (8 mg total) by mouth 2  (two) times daily as needed for refractory nausea / vomiting. 30 tablet 1   oxyCODONE-acetaminophen (PERCOCET/ROXICET) 5-325 MG tablet Take 1 tablet by mouth every 8 (eight) hours as needed for severe pain. 60 tablet 0   prochlorperazine (COMPAZINE) 10 MG tablet Take 1 tablet (10 mg total) by mouth every 6 (six) hours as needed for nausea or vomiting. 30 tablet 1   venlafaxine XR (EFFEXOR-XR) 150 MG 24 hr capsule Take 1 capsule (150 mg total) by mouth daily with breakfast. 30 capsule 6   No current facility-administered medications for this visit.   Facility-Administered Medications Ordered in Other Visits  Medication Dose Route Frequency Provider Last Rate Last Admin   sodium chloride flush (NS) 0.9 % injection 10 mL  10 mL Intravenous PRN GNicholas Lose MD   10 mL at 04/02/21 1023   sodium chloride flush (NS) 0.9 % injection 10 mL  10 mL Intravenous Once Iruku, Praveena, MD        PHYSICAL EXAMINATION: ECOG PERFORMANCE STATUS: 1 - Symptomatic but completely ambulatory  There were no vitals filed for this visit. There were no vitals filed for this visit.  LABORATORY DATA:  I have reviewed the data as listed CMP Latest Ref Rng & Units 03/12/2021 02/19/2021 01/29/2021  Glucose 70 - 99 mg/dL 116(H) 103(H) 104(H)  BUN 6 - 20 mg/dL 11 14 15  Creatinine 0.44 - 1.00 mg/dL 0.68 0.63 0.79  Sodium 135 - 145 mmol/L 140 138 141  Potassium 3.5 - 5.1 mmol/L 4.4 4.3 4.1  Chloride 98 - 111 mmol/L 110 108 109  CO2 22 - 32 mmol/L 26 25 25   Calcium 8.9 - 10.3 mg/dL 9.1 8.6(L) 8.8(L)  Total Protein 6.5 - 8.1 g/dL 6.5 6.2(L) 6.1(L)  Total Bilirubin 0.3 - 1.2 mg/dL 0.5 0.4 0.5  Alkaline Phos 38 - 126 U/L 70 68 68  AST 15 - 41 U/L 13(L) 13(L) 14(L)  ALT 0 - 44 U/L 15 13 16     Lab Results  Component Value Date   WBC 5.0 03/12/2021   HGB 11.2 (L) 03/12/2021   HCT 34.3 (L) 03/12/2021   MCV 87.7 03/12/2021   PLT 252 03/12/2021   NEUTROABS 3.4 03/12/2021    ASSESSMENT & PLAN:  Carcinoma of breast  metastatic to bone ([place]) 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, FISH positive ratio 2.8, Ki-67 25%, lymph node positive   Palliative radiation completed 09/03/2020 Taxotere/Herceptin/Perjeta 08/31/2020-12/18/2020 Herceptin Perjeta Maintenance  beginning 01/08/2021   Treatment plan:  Herceptin Perjeta maintenance  ------------------------------------------------------------------------------------------------------------------------------------ Current treatment:  Herceptin Perjeta, Zoladex, and anastrozole Toxicities: Depression/ADHD: She is establishing with primary care for management of this. Weight loss: Her weight is stable Diarrhea: Resolved  CT CAP 03-21-2021: Left-sided breast mass appears similar, widespread metastatic disease to the bones appear similar, no new bone lesions, 4 mm nodule right middle lobe stable, no suspicious abnormalities in the liver  Based on excellent response to treatment we will continue with the current plan. We will obtain a mammogram and ultrasound of the left breast to evaluate the breast a little bit further because she is having some pain and discomfort in the breast.  Return to clinic every 4 weeks for Herceptin Perjeta and Zoladex and every 8 weeks to follow-up with [place].    No orders of the defined types were placed in this encounter.  The patient has a good understanding of the overall plan. she agrees with it. she will call with any problems that may develop before the next visit here.  Total time spent: 30 mins including face to face time and time spent for planning, charting and coordination of care  Rulon Eisenmenger, MD, MPH 04/02/2021  I, Thana Ates, am acting as scribe for Dr. Nicholas [name].  I have reviewed the above documentation for accuracy and completeness,  and I agree with the above.

## 2021-04-02 ENCOUNTER — Inpatient Hospital Stay (HOSPITAL_BASED_OUTPATIENT_CLINIC_OR_DEPARTMENT_OTHER): Payer: Medicaid Other | Admitting: Hematology and Oncology

## 2021-04-02 ENCOUNTER — Other Ambulatory Visit: Payer: Self-pay

## 2021-04-02 ENCOUNTER — Inpatient Hospital Stay: Payer: Medicaid Other | Attending: Adult Health

## 2021-04-02 ENCOUNTER — Other Ambulatory Visit: Payer: Self-pay | Admitting: *Deleted

## 2021-04-02 ENCOUNTER — Encounter: Payer: Self-pay | Admitting: *Deleted

## 2021-04-02 ENCOUNTER — Encounter: Payer: Self-pay | Admitting: General Practice

## 2021-04-02 ENCOUNTER — Inpatient Hospital Stay: Payer: Medicaid Other

## 2021-04-02 VITALS — BP 110/77 | HR 86 | Temp 98.3°F | Resp 18

## 2021-04-02 DIAGNOSIS — C787 Secondary malignant neoplasm of liver and intrahepatic bile duct: Secondary | ICD-10-CM | POA: Diagnosis not present

## 2021-04-02 DIAGNOSIS — C50919 Malignant neoplasm of unspecified site of unspecified female breast: Secondary | ICD-10-CM

## 2021-04-02 DIAGNOSIS — Z95828 Presence of other vascular implants and grafts: Secondary | ICD-10-CM

## 2021-04-02 DIAGNOSIS — Z79620 Long term (current) use of immunosuppressive biologic: Secondary | ICD-10-CM | POA: Insufficient documentation

## 2021-04-02 DIAGNOSIS — C778 Secondary and unspecified malignant neoplasm of lymph nodes of multiple regions: Secondary | ICD-10-CM | POA: Insufficient documentation

## 2021-04-02 DIAGNOSIS — C7951 Secondary malignant neoplasm of bone: Secondary | ICD-10-CM

## 2021-04-02 DIAGNOSIS — C50912 Malignant neoplasm of unspecified site of left female breast: Secondary | ICD-10-CM

## 2021-04-02 DIAGNOSIS — Z79899 Other long term (current) drug therapy: Secondary | ICD-10-CM | POA: Insufficient documentation

## 2021-04-02 DIAGNOSIS — Z7901 Long term (current) use of anticoagulants: Secondary | ICD-10-CM | POA: Insufficient documentation

## 2021-04-02 DIAGNOSIS — Z170 Estrogen receptor positive status [ER+]: Secondary | ICD-10-CM | POA: Insufficient documentation

## 2021-04-02 DIAGNOSIS — Z5112 Encounter for antineoplastic immunotherapy: Secondary | ICD-10-CM | POA: Diagnosis present

## 2021-04-02 DIAGNOSIS — Z79811 Long term (current) use of aromatase inhibitors: Secondary | ICD-10-CM | POA: Insufficient documentation

## 2021-04-02 LAB — CBC WITH DIFFERENTIAL (CANCER CENTER ONLY)
Abs Immature Granulocytes: 0.02 10*3/uL (ref 0.00–0.07)
Basophils Absolute: 0 10*3/uL (ref 0.0–0.1)
Basophils Relative: 0 %
Eosinophils Absolute: 0.1 10*3/uL (ref 0.0–0.5)
Eosinophils Relative: 2 %
HCT: 34.9 % — ABNORMAL LOW (ref 36.0–46.0)
Hemoglobin: 11.6 g/dL — ABNORMAL LOW (ref 12.0–15.0)
Immature Granulocytes: 0 %
Lymphocytes Relative: 20 %
Lymphs Abs: 1.1 10*3/uL (ref 0.7–4.0)
MCH: 28.6 pg (ref 26.0–34.0)
MCHC: 33.2 g/dL (ref 30.0–36.0)
MCV: 86 fL (ref 80.0–100.0)
Monocytes Absolute: 0.4 10*3/uL (ref 0.1–1.0)
Monocytes Relative: 7 %
Neutro Abs: 3.8 10*3/uL (ref 1.7–7.7)
Neutrophils Relative %: 71 %
Platelet Count: 306 10*3/uL (ref 150–400)
RBC: 4.06 MIL/uL (ref 3.87–5.11)
RDW: 13.8 % (ref 11.5–15.5)
WBC Count: 5.3 10*3/uL (ref 4.0–10.5)
nRBC: 0 % (ref 0.0–0.2)

## 2021-04-02 LAB — CMP (CANCER CENTER ONLY)
ALT: 16 U/L (ref 0–44)
AST: 16 U/L (ref 15–41)
Albumin: 3.5 g/dL (ref 3.5–5.0)
Alkaline Phosphatase: 61 U/L (ref 38–126)
Anion gap: 7 (ref 5–15)
BUN: 12 mg/dL (ref 6–20)
CO2: 25 mmol/L (ref 22–32)
Calcium: 8.5 mg/dL — ABNORMAL LOW (ref 8.9–10.3)
Chloride: 108 mmol/L (ref 98–111)
Creatinine: 0.55 mg/dL (ref 0.44–1.00)
GFR, Estimated: >60 mL/min (ref >60)
Glucose, Bld: 90 mg/dL (ref 70–99)
Potassium: 3.6 mmol/L (ref 3.5–5.1)
Sodium: 140 mmol/L (ref 135–145)
Total Bilirubin: 0.2 mg/dL — ABNORMAL LOW (ref 0.3–1.2)
Total Protein: 6.1 g/dL — ABNORMAL LOW (ref 6.5–8.1)

## 2021-04-02 MED ORDER — HEPARIN SOD (PORK) LOCK FLUSH 100 UNIT/ML IV SOLN
500.0000 [IU] | Freq: Once | INTRAVENOUS | Status: AC | PRN
  Administered 2021-04-02: 500 [IU]

## 2021-04-02 MED ORDER — TRASTUZUMAB-DKST CHEMO 150 MG IV SOLR
6.0000 mg/kg | Freq: Once | INTRAVENOUS | Status: AC
  Administered 2021-04-02: 378 mg via INTRAVENOUS
  Filled 2021-04-02: qty 18

## 2021-04-02 MED ORDER — SODIUM CHLORIDE 0.9 % IV SOLN
420.0000 mg | Freq: Once | INTRAVENOUS | Status: AC
  Administered 2021-04-02: 420 mg via INTRAVENOUS
  Filled 2021-04-02: qty 14

## 2021-04-02 MED ORDER — SODIUM CHLORIDE 0.9% FLUSH: 10.0000 mL | Freq: Once | INTRAVENOUS | Status: DC

## 2021-04-02 MED ORDER — SODIUM CHLORIDE 0.9 % IV SOLN: Freq: Once | INTRAVENOUS | Status: AC

## 2021-04-02 MED ORDER — SODIUM CHLORIDE 0.9% FLUSH
10.0000 mL | INTRAVENOUS | Status: DC | PRN
  Administered 2021-04-02: 10 mL via INTRAVENOUS

## 2021-04-02 MED ORDER — DIPHENHYDRAMINE HCL 25 MG PO CAPS
25.0000 mg | ORAL_CAPSULE | Freq: Once | ORAL | Status: AC
  Administered 2021-04-02: 25 mg via ORAL
  Filled 2021-04-02: qty 1

## 2021-04-02 MED ORDER — SODIUM CHLORIDE 0.9% FLUSH
10.0000 mL | INTRAVENOUS | Status: DC | PRN
  Administered 2021-04-02: 10 mL

## 2021-04-02 MED ORDER — ACETAMINOPHEN 325 MG PO TABS
650.0000 mg | ORAL_TABLET | Freq: Once | ORAL | Status: AC
  Administered 2021-04-02: 650 mg via ORAL
  Filled 2021-04-02: qty 2

## 2021-04-02 NOTE — Progress Notes (Signed)
[facility] Spiritual Care Note  Followed up with Ailene Ravel in infusion. She was very appreciative, but feeling too tired for a visit, so we will check in at a future treatment. She is aware of ongoing chaplain availability in the meantime as needed, too.   [place], [place], [facility] Pager [phone] Voicemail [phone]

## 2021-04-02 NOTE — Assessment & Plan Note (Signed)
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 Weight loss:Her weight is stable Diarrhea: Resolved  CT CAP 03-21-2021: Left-sided breast mass appears similar, widespread metastatic disease to the bones appear similar, no new bone lesions, 4 mm nodule right middle lobe stable, no suspicious abnormalities in the liver  Based on excellent response to treatment we will continue with the current plan. Return to clinic every 4 weeks for Herceptin Perjeta and Zoladex and every 8 weeks to follow-up with [place].

## 2021-04-02 NOTE — Patient Instructions (Signed)
[facility] MEDICAL ONCOLOGY  Discharge Instructions: Thank you for choosing [facility] to provide your oncology and hematology care.   If you have a lab appointment with the Cancer Center, please go directly to the Cancer Center and check in at the registration area.   Wear comfortable clothing and clothing appropriate for easy access to any Portacath or PICC line.   We strive to give you quality time with your provider. You may need to reschedule your appointment if you arrive late (15 or more minutes).  Arriving late affects you and other patients whose appointments are after yours.  Also, if you miss three or more appointments without notifying the office, you may be dismissed from the clinic at the provider's discretion.      For prescription refill requests, have your pharmacy contact our office and allow 72 hours for refills to be completed.    Today you received the following chemotherapy and/or immunotherapy agents: Trastuzumab, Pertuzumab.      To help prevent nausea and vomiting after your treatment, we encourage you to take your nausea medication as directed.  BELOW ARE SYMPTOMS THAT SHOULD BE REPORTED IMMEDIATELY: *FEVER GREATER THAN 100.4 F (38 C) OR HIGHER *CHILLS OR SWEATING *NAUSEA AND VOMITING THAT IS NOT CONTROLLED WITH YOUR NAUSEA MEDICATION *UNUSUAL SHORTNESS OF BREATH *UNUSUAL BRUISING OR BLEEDING *URINARY PROBLEMS (pain or burning when urinating, or frequent urination) *BOWEL PROBLEMS (unusual diarrhea, constipation, pain near the anus) TENDERNESS IN MOUTH AND THROAT WITH OR WITHOUT PRESENCE OF ULCERS (sore throat, sores in mouth, or a toothache) UNUSUAL RASH, SWELLING OR PAIN  UNUSUAL VAGINAL DISCHARGE OR ITCHING   Items with * indicate a potential emergency and should be followed up as soon as possible or go to the Emergency Department if any problems should occur.  Please show the CHEMOTHERAPY ALERT CARD or IMMUNOTHERAPY ALERT CARD  at check-in to the Emergency Department and triage nurse.  Should you have questions after your visit or need to cancel or reschedule your appointment, please contact [facility]ICAL [facility]COL[facility]  Dept: [phone]  and follow the prompts.  Office hours are 8:00 a.m. to 4:30 p.m. Monday - Friday. Please note that voicemails left after 4:00 p.m. may not be returned until the following business day.  We are closed weekends and major holidays. You have access to a nurse at all times for urgent questions. Please call the main number to the clinic Dept: [phone] and follow the prompts.   For any non-urgent questions, you may also contact your provider using MyChart. We now offer e-Visits for anyone 18 and older to request care online for non-urgent symptoms. For details visit [email or web address].   Also download the MyChart app! Go to the app store, search "MyChart", open the app, select , and log in with your MyChart username and password.  Due to Covid, a mask is required upon entering the hospital/clinic. If you do not have a mask, one will be given to you upon arrival. For doctor visits, patients may have 1 support person aged 18 or older with them. For treatment visits, patients cannot have anyone with them due to current Covid guidelines and our immunocompromised population.  

## 2021-04-02 NOTE — Progress Notes (Signed)
Pt declined to stay for 30 minute observation period post Pertuzumab. VSS. Pt had no complaints at time of discharge.

## 2021-04-02 NOTE — Progress Notes (Signed)
Per MD request RN placed order for left sided mammogram and [place] to further evaluate left breast pain.

## 2021-04-03 ENCOUNTER — Ambulatory Visit: Payer: Medicaid Other | Attending: Adult Health | Admitting: Physical Therapy

## 2021-04-03 ENCOUNTER — Encounter: Payer: Self-pay | Admitting: Physical Therapy

## 2021-04-03 DIAGNOSIS — C7951 Secondary malignant neoplasm of bone: Secondary | ICD-10-CM | POA: Diagnosis present

## 2021-04-03 DIAGNOSIS — C50919 Malignant neoplasm of unspecified site of unspecified female breast: Secondary | ICD-10-CM | POA: Diagnosis not present

## 2021-04-03 NOTE — Therapy (Addendum)
[facility] @ [address] [place], [place], [address] Phone: [phone]   Fax:  [phone]  Physical Therapy Treatment  Patient Details  Name: Ellen [name] MRN: [id number] Date of Birth: 04-15-1995 Referring Provider (PT): Wilber Bihari NP   Encounter Date: 04/03/2021   PT End of Session - 04/03/21 1447     Visit Number 4    Number of Visits 13    Date for PT Re-Evaluation 04/09/21    Authorization Type medicaid no auth needed    Authorization - Visit Number 4    Authorization - Number of Visits 27    PT Start Time 6468    PT Stop Time 1503    PT Time Calculation (min) 17 min    Activity Tolerance Patient tolerated treatment well;No increased pain    Behavior During Therapy WFL for tasks assessed/performed             Past Medical History:  Diagnosis Date   Anxiety    Asthma    Exercise Induced   Cancer ([place])    Depression    Family history of pancreatic cancer    Family history of thyroid cancer    Herpes simplex     Past Surgical History:  Procedure Laterality Date   NO PAST SURGERIES     PORTACATH PLACEMENT Right 08/23/2020   Procedure: INSERTION PORT-A-CATH;  Surgeon: [name], MD;  Location: [facility];  Service: General;  Laterality: Right;    There were no vitals filed for this visit.   Subjective Assessment - 04/03/21 1448     Subjective Finally getting over a sickness. Was able to take a walk the other day. HEP going well. I would like today to be my last day. I am doing much better at home.    Pertinent History 08/15/20: Discovery of Lt breast cancer, sclerotic lesions in ribs, thoracic spine, liver, iliac wings, acetabulur, iliac, and sacral bones, and lumbar spine. Completed palliative radiation to lumbar spine and pelvis. Chemotherapy started Herceptin and Perjeta maintenance every 3 weeks. Compression fractures L3-5 with canal stenosis    Currently in Pain?  No/denies    Multiple Pain Sites No                [facility] PT Assessment - 04/03/21 0001       Assessment   Medical Diagnosis metastatic breast cancer    Referring Provider (PT) Wilber Bihari NP    Onset Date/Surgical Date 08/08/20                           [facility] Adult PT Treatment/Exercise - 04/03/21 0001       Lumbar Exercises: Supine   Isometric Hip Flexion 5 reps   10 sec hold: good return demo   Other Supine Lumbar Exercises Supine ball squeeze holding yellow band taut while performing shoulder flexion 2x10                     PT Education - 04/03/21 1507     Education Details Review of all HEP    Person(s) Educated Patient    Methods Demonstration    Comprehension Returned demonstration                 PT Long Term Goals - 04/03/21 1457       PT LONG TERM GOAL #1   Title Pt will be educated on  safe exercise and functional mobility living with bony mets    Time 6    Period Weeks    Status Achieved      PT LONG TERM GOAL #2   Title Pt will report improvement in strength with ADLs by at least 50%    Time 6    Period Weeks    Status Achieved   50%-60%     PT LONG TERM GOAL #3   Title Pt will be ind with final HEP    Time 6    Period Weeks    Status New                   Plan - 04/03/21 1449     Clinical Impression Statement Pt arrives t opT pain free and reporting 50%-60% improvment in her ADL tolerance including her confidence. Pt is satisfied and independent in her HEP, all long term goals are met at this time.    Personal Factors and Comorbidities Comorbidity 2    Comorbidities active cancer, bony mets    Examination-Activity Limitations Squat;Stairs    Stability/Clinical Decision Making Evolving/Moderate complexity    Rehab Potential Good    PT Frequency 2x / week    PT Duration 6 weeks    PT Treatment/Interventions Aquatic Therapy;Dry needling;Therapeutic activities;Therapeutic exercise;Manual  techniques    PT Next Visit Plan Discharge per pt being satisfied with functional status. Plans to continue with her HEP and mentioned possibly looking into the [facility] prep program.    PT Home Exercise Plan Access Code: [facility]    Consulted and Agree with Plan of Care Patient             Patient will benefit from skilled therapeutic intervention in order to improve the following deficits and impairments:  Decreased range of motion, Pain, Decreased knowledge of precautions  Visit Diagnosis: Carcinoma of breast metastatic to bone, unspecified laterality [facility])     Problem List Patient Active Problem List   Diagnosis Date Noted   AKI (acute kidney injury) ([place]) 09/09/2020   Genetic testing 09/06/2020   Family history of thyroid cancer 08/29/2020   Family history of pancreatic cancer 08/29/2020   Carcinoma of breast metastatic to bone ([place]) 08/17/2020   Metastatic breast cancer ([place]) 08/16/2020   Left breast mass 08/13/2020   Genital herpes 08/13/2020   Attention deficit hyperactivity disorder (ADHD), 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   GAD (generalized anxiety disorder) 02/16/2017   Marijuana user 02/16/2017   Mild intermittent asthma without complication 11/91/4782   Severe episode of recurrent major depressive disorder, without psychotic features ([place]) 02/16/2017   Depression 04/20/2012    Tena Linebaugh, PTA 04/03/2021, 3:07 PM  [facility] @ [address] [place], [place], [address] Phone: [phone]   Fax:  [phone]  Name: Ellen [name] MRN: [id number] Date of Birth: 02-16-1996  PHYSICAL THERAPY DISCHARGE SUMMARY  Visits from Start of Care: 4  Current functional level related to goals / functional outcomes: See above   Remaining deficits: See above   Education / Equipment: HEP  Plan: Patient agrees to discharge.  Patient goals were not met. Patient is being discharged due to meeting  the stated rehab goals.    Shan Levans, PT

## 2021-04-09 ENCOUNTER — Encounter: Payer: Self-pay | Admitting: Adult Health

## 2021-04-09 ENCOUNTER — Other Ambulatory Visit: Payer: Self-pay

## 2021-04-09 ENCOUNTER — Inpatient Hospital Stay: Payer: Medicaid Other

## 2021-04-09 VITALS — BP 123/81 | HR 94 | Temp 98.8°F | Resp 18

## 2021-04-09 DIAGNOSIS — C50919 Malignant neoplasm of unspecified site of unspecified female breast: Secondary | ICD-10-CM

## 2021-04-09 DIAGNOSIS — Z5112 Encounter for antineoplastic immunotherapy: Secondary | ICD-10-CM | POA: Diagnosis not present

## 2021-04-09 MED ORDER — GOSERELIN ACETATE 3.6 MG ~~LOC~~ IMPL
3.6000 mg | DRUG_IMPLANT | Freq: Once | SUBCUTANEOUS | Status: AC
  Administered 2021-04-09: 3.6 mg via SUBCUTANEOUS
  Filled 2021-04-09: qty 3.6

## 2021-04-09 NOTE — Progress Notes (Signed)
Reviewed last zoladex injection location per pt request so she would know which side to ice for her injection today.  Last injection was on the right side per chart 1/24.

## 2021-04-09 NOTE — Patient Instructions (Signed)
Goserelin injection °What is this medication? °GOSERELIN (GOE se rel in) is similar to a hormone found in the body. It lowers the amount of sex hormones that the body makes. Men will have lower testosterone levels and women will have lower estrogen levels while taking this medicine. In men, this medicine is used to treat prostate cancer; the injection is either given once per month or once every 12 weeks. A once per month injection (only) is used to treat women with endometriosis, dysfunctional uterine bleeding, or advanced breast cancer. °This medicine may be used for other purposes; ask your health care provider or pharmacist if you have questions. °COMMON BRAND NAME(S): Zoladex, Zoladex 3-Month °What should I tell my care team before I take this medication? °They need to know if you have any of these conditions: °bone problems °diabetes °heart disease °history of irregular heartbeat °an unusual or allergic reaction to goserelin, other medicines, foods, dyes, or preservatives °pregnant or trying to get pregnant °breast-feeding °How should I use this medication? °This medicine is for injection under the skin. It is given by a health care professional in a hospital or clinic setting. °Talk to your pediatrician regarding the use of this medicine in children. Special care may be needed. °Overdosage: If you think you have taken too much of this medicine contact a poison control center or emergency room at once. °NOTE: This medicine is only for you. Do not share this medicine with others. °What if I miss a dose? °It is important not to miss your dose. Call your doctor or health care professional if you are unable to keep an appointment. °What may interact with this medication? °Do not take this medicine with any of the following medications: °cisapride °dronedarone °pimozide °thioridazine °This medicine may also interact with the following medications: °other medicines that prolong the QT interval (an abnormal heart  rhythm) °This list may not describe all possible interactions. Give your health care provider a list of all the medicines, herbs, non-prescription drugs, or dietary supplements you use. Also tell them if you smoke, drink alcohol, or use illegal drugs. Some items may interact with your medicine. °What should I watch for while using this medication? °Visit your doctor or health care provider for regular checks on your progress. Your symptoms may appear to get worse during the first weeks of this therapy. Tell your doctor or healthcare provider if your symptoms do not start to get better or if they get worse after this time. °Your bones may get weaker if you take this medicine for a long time. If you smoke or frequently drink alcohol you may increase your risk of bone loss. A family history of osteoporosis, chronic use of drugs for seizures (convulsions), or corticosteroids can also increase your risk of bone loss. Talk to your doctor about how to keep your bones strong. °This medicine should stop regular monthly menstruation in women. Tell your doctor if you continue to menstruate. °Women should not become pregnant while taking this medicine or for 12 weeks after stopping this medicine. Women should inform their doctor if they wish to become pregnant or think they might be pregnant. There is a potential for serious side effects to an unborn child. Talk to your health care professional or pharmacist for more information. Do not breast-feed an infant while taking this medicine. °Men should inform their doctors if they wish to father a child. This medicine may lower sperm counts. Talk to your health care professional or pharmacist for more information. °This   medicine may increase blood sugar. Ask your healthcare provider if changes in diet or medicines are needed if you have diabetes. °What side effects may I notice from receiving this medication? °Side effects that you should report to your doctor or health care  professional as soon as possible: °allergic reactions like skin rash, itching or hives, swelling of the face, lips, or tongue °bone pain °breathing problems °changes in vision °chest pain °feeling faint or lightheaded, falls °fever, chills °pain, swelling, warmth in the leg °pain, tingling, numbness in the hands or feet °signs and symptoms of high blood sugar such as being more thirsty or hungry or having to urinate more than normal. You may also feel very tired or have blurry vision °signs and symptoms of low blood pressure like dizziness; feeling faint or lightheaded, falls; unusually weak or tired °stomach pain °swelling of the ankles, feet, hands °trouble passing urine or change in the amount of urine °unusually high or low blood pressure °unusually weak or tired °Side effects that usually do not require medical attention (report to your doctor or health care professional if they continue or are bothersome): °change in sex drive or performance °changes in breast size in both males and females °changes in emotions or moods °headache °hot flashes °irritation at site where injected °loss of appetite °skin problems like acne, dry skin °vaginal dryness °This list may not describe all possible side effects. Call your doctor for medical advice about side effects. You may report side effects to FDA at 1-800-FDA-1088. °Where should I keep my medication? °This drug is given in a hospital or clinic and will not be stored at home. °NOTE: This sheet is a summary. It may not cover all possible information. If you have questions about this medicine, talk to your doctor, pharmacist, or health care provider. °© 2022 Elsevier/Gold Standard (2018-06-04 00:00:00) ° °

## 2021-04-17 ENCOUNTER — Ambulatory Visit
Admission: RE | Admit: 2021-04-17 | Discharge: 2021-04-17 | Disposition: A | Discharge status: Home / Self Care | Payer: Medicaid Other | Source: Ambulatory Visit | Attending: Hematology and Oncology | Admitting: Hematology and Oncology

## 2021-04-17 DIAGNOSIS — N644 Mastodynia: Secondary | ICD-10-CM | POA: Diagnosis not present

## 2021-04-17 DIAGNOSIS — R922 Inconclusive mammogram: Secondary | ICD-10-CM | POA: Diagnosis not present

## 2021-04-17 DIAGNOSIS — C50912 Malignant neoplasm of unspecified site of left female breast: Secondary | ICD-10-CM

## 2021-04-17 DIAGNOSIS — C7951 Secondary malignant neoplasm of bone: Secondary | ICD-10-CM

## 2021-04-23 ENCOUNTER — Inpatient Hospital Stay: Payer: Medicaid Other

## 2021-05-01 ENCOUNTER — Other Ambulatory Visit: Payer: Self-pay | Admitting: Adult Health

## 2021-05-07 ENCOUNTER — Ambulatory Visit: Payer: Medicaid Other

## 2021-05-08 ENCOUNTER — Encounter: Payer: Self-pay | Admitting: Hematology and Oncology

## 2021-05-08 ENCOUNTER — Encounter: Payer: Self-pay | Admitting: Licensed Clinical Social Worker

## 2021-05-08 ENCOUNTER — Encounter: Payer: Self-pay | Admitting: Adult Health

## 2021-05-08 ENCOUNTER — Other Ambulatory Visit: Payer: Self-pay

## 2021-05-08 ENCOUNTER — Inpatient Hospital Stay: Payer: Medicaid Other | Attending: Adult Health

## 2021-05-08 ENCOUNTER — Inpatient Hospital Stay: Payer: Medicaid Other

## 2021-05-08 ENCOUNTER — Telehealth: Payer: Self-pay | Admitting: Licensed Clinical Social Worker

## 2021-05-08 ENCOUNTER — Inpatient Hospital Stay (HOSPITAL_BASED_OUTPATIENT_CLINIC_OR_DEPARTMENT_OTHER): Payer: Medicaid Other | Admitting: Adult Health

## 2021-05-08 ENCOUNTER — Other Ambulatory Visit: Payer: Self-pay | Admitting: Hematology and Oncology

## 2021-05-08 VITALS — BP 129/74 | HR 92 | Temp 97.6°F | Resp 16 | Ht 65.0 in | Wt 140.6 lb

## 2021-05-08 DIAGNOSIS — Z5111 Encounter for antineoplastic chemotherapy: Secondary | ICD-10-CM | POA: Diagnosis not present

## 2021-05-08 DIAGNOSIS — C7951 Secondary malignant neoplasm of bone: Secondary | ICD-10-CM | POA: Insufficient documentation

## 2021-05-08 DIAGNOSIS — C773 Secondary and unspecified malignant neoplasm of axilla and upper limb lymph nodes: Secondary | ICD-10-CM | POA: Insufficient documentation

## 2021-05-08 DIAGNOSIS — C787 Secondary malignant neoplasm of liver and intrahepatic bile duct: Secondary | ICD-10-CM | POA: Diagnosis not present

## 2021-05-08 DIAGNOSIS — Z170 Estrogen receptor positive status [ER+]: Secondary | ICD-10-CM | POA: Insufficient documentation

## 2021-05-08 DIAGNOSIS — C50912 Malignant neoplasm of unspecified site of left female breast: Secondary | ICD-10-CM

## 2021-05-08 DIAGNOSIS — C50919 Malignant neoplasm of unspecified site of unspecified female breast: Secondary | ICD-10-CM

## 2021-05-08 DIAGNOSIS — Z5112 Encounter for antineoplastic immunotherapy: Secondary | ICD-10-CM | POA: Diagnosis present

## 2021-05-08 DIAGNOSIS — Z95828 Presence of other vascular implants and grafts: Secondary | ICD-10-CM

## 2021-05-08 LAB — CBC WITH DIFFERENTIAL (CANCER CENTER ONLY)
Abs Immature Granulocytes: 0.01 10*3/uL (ref 0.00–0.07)
Basophils Absolute: 0 10*3/uL (ref 0.0–0.1)
Basophils Relative: 0 %
Eosinophils Absolute: 0.1 10*3/uL (ref 0.0–0.5)
Eosinophils Relative: 2 %
HCT: 38.4 % (ref 36.0–46.0)
Hemoglobin: 12.6 g/dL (ref 12.0–15.0)
Immature Granulocytes: 0 %
Lymphocytes Relative: 18 %
Lymphs Abs: 1.3 10*3/uL (ref 0.7–4.0)
MCH: 28.8 pg (ref 26.0–34.0)
MCHC: 32.8 g/dL (ref 30.0–36.0)
MCV: 87.7 fL (ref 80.0–100.0)
Monocytes Absolute: 0.5 10*3/uL (ref 0.1–1.0)
Monocytes Relative: 7 %
Neutro Abs: 5.2 10*3/uL (ref 1.7–7.7)
Neutrophils Relative %: 73 %
Platelet Count: 280 10*3/uL (ref 150–400)
RBC: 4.38 MIL/uL (ref 3.87–5.11)
RDW: 12.7 % (ref 11.5–15.5)
WBC Count: 7.1 10*3/uL (ref 4.0–10.5)
nRBC: 0 % (ref 0.0–0.2)

## 2021-05-08 LAB — CMP (CANCER CENTER ONLY)
ALT: 16 U/L (ref 0–44)
AST: 15 U/L (ref 15–41)
Albumin: 4.3 g/dL (ref 3.5–5.0)
Alkaline Phosphatase: 72 U/L (ref 38–126)
Anion gap: 5 (ref 5–15)
BUN: 16 mg/dL (ref 6–20)
CO2: 26 mmol/L (ref 22–32)
Calcium: 9.4 mg/dL (ref 8.9–10.3)
Chloride: 108 mmol/L (ref 98–111)
Creatinine: 0.82 mg/dL (ref 0.44–1.00)
GFR, Estimated: >60 mL/min (ref >60)
Glucose, Bld: 112 mg/dL — ABNORMAL HIGH (ref 70–99)
Potassium: 4.1 mmol/L (ref 3.5–5.1)
Sodium: 139 mmol/L (ref 135–145)
Total Bilirubin: 0.6 mg/dL (ref 0.3–1.2)
Total Protein: 6.9 g/dL (ref 6.5–8.1)

## 2021-05-08 MED ORDER — ZOLEDRONIC ACID 4 MG/100ML IV SOLN
4.0000 mg | Freq: Once | INTRAVENOUS | Status: AC
  Administered 2021-05-08: 4 mg via INTRAVENOUS
  Filled 2021-05-08: qty 100

## 2021-05-08 MED ORDER — TRASTUZUMAB-DKST CHEMO 150 MG IV SOLR
6.0000 mg/kg | Freq: Once | INTRAVENOUS | Status: AC
  Administered 2021-05-08: 378 mg via INTRAVENOUS
  Filled 2021-05-08: qty 18

## 2021-05-08 MED ORDER — SODIUM CHLORIDE 0.9% FLUSH
10.0000 mL | INTRAVENOUS | Status: AC | PRN
  Administered 2021-05-08: 10 mL

## 2021-05-08 MED ORDER — SODIUM CHLORIDE 0.9 % IV SOLN: Freq: Once | INTRAVENOUS | Status: AC

## 2021-05-08 MED ORDER — GOSERELIN ACETATE 3.6 MG ~~LOC~~ IMPL
3.6000 mg | DRUG_IMPLANT | Freq: Once | SUBCUTANEOUS | Status: AC
  Administered 2021-05-08: 3.6 mg via SUBCUTANEOUS
  Filled 2021-05-08: qty 3.6

## 2021-05-08 MED ORDER — SODIUM CHLORIDE 0.9 % IV SOLN: Freq: Once | INTRAVENOUS | Status: DC

## 2021-05-08 MED ORDER — DIPHENHYDRAMINE HCL 25 MG PO CAPS
25.0000 mg | ORAL_CAPSULE | Freq: Once | ORAL | Status: AC
  Administered 2021-05-08: 25 mg via ORAL
  Filled 2021-05-08: qty 1

## 2021-05-08 MED ORDER — SODIUM CHLORIDE 0.9 % IV SOLN
420.0000 mg | Freq: Once | INTRAVENOUS | Status: AC
  Administered 2021-05-08: 420 mg via INTRAVENOUS
  Filled 2021-05-08: qty 14

## 2021-05-08 MED ORDER — ACETAMINOPHEN 325 MG PO TABS
650.0000 mg | ORAL_TABLET | Freq: Once | ORAL | Status: AC
  Administered 2021-05-08: 650 mg via ORAL
  Filled 2021-05-08: qty 2

## 2021-05-08 MED ORDER — SODIUM CHLORIDE 0.9% FLUSH
10.0000 mL | INTRAVENOUS | Status: DC | PRN
  Administered 2021-05-08: 10 mL

## 2021-05-08 MED ORDER — HEPARIN SOD (PORK) LOCK FLUSH 100 UNIT/ML IV SOLN
500.0000 [IU] | Freq: Once | INTRAVENOUS | Status: AC | PRN
  Administered 2021-05-08: 500 [IU]

## 2021-05-08 NOTE — Assessment & Plan Note (Addendum)
08/15/2020: CT CAP: Breast mass 3.5 cm, left axillary lymph node, multiple bone metastases, extensive liver metastases, small lung nodules ?? 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 ?Palliative radiation completed 09/03/2020 ?Taxotere/Herceptin/Perjeta 08/31/2020-12/18/2020 ?Herceptin Perjeta Maintenance ?beginning 01/08/2021 ?? ?Treatment plan:  Herceptin Perjeta maintenance  ?------------------------------------------------------------------------------------------------------------------------------------ ?Current treatment: ?Herceptin Perjeta, Zoladex, and anastrozole ?Toxicities: ?Depression/ADHD: She is establishing with primary care for management of this. ?Weight loss:?Her weight is stable ?Diarrhea: Resolved ? ?CT CAP 03-21-2021: Left-sided breast mass appears similar, widespread metastatic disease to the bones appear similar, no new bone lesions, 4 mm nodule right middle lobe stable, no suspicious abnormalities in the liver ? ?Echo 03/26/2021: LVEF 60 to 65% ? ?[facility] will continue with current treatment.  She is due for repeat echo in 06/2021.  I placed a referral to gyn-oncology to discuss bilateral oopherectomy since she will be on Zoladex long term, and oopherectomy is an option to avoid continued Zoladex injections.   ? ?Return to clinic every 4 weeks for Herceptin Perjeta and Zoladex and every 8 weeks to follow-up with [place]. ?

## 2021-05-08 NOTE — Patient Instructions (Signed)
[facility]  Discharge Instructions: ?Thank you for choosing [facility] to provide your oncology and hematology care.  ? ?If you have a lab appointment with the [facility], please go directly to the [facility] and check in at the registration area. ?  ?Wear comfortable clothing and clothing appropriate for easy access to any Portacath or PICC line.  ? ?We strive to give you quality time with your provider. You may need to reschedule your appointment if you arrive late (15 or more minutes).  Arriving late affects you and other patients whose appointments are after yours.  Also, if you miss three or more appointments without notifying the office, you may be dismissed from the clinic at the provider?s discretion.    ?  ?For prescription refill requests, have your pharmacy contact our office and allow 72 hours for refills to be completed.   ? ?Today you received the following chemotherapy and/or immunotherapy agents: Trastuzumab, Pertuzumab, Zoladex and Zometa ?  ?To help prevent nausea and vomiting after your treatment, we encourage you to take your nausea medication as directed. ? ?BELOW ARE SYMPTOMS THAT SHOULD BE REPORTED IMMEDIATELY: ?*FEVER GREATER THAN 100.4 F (38 ?C) OR HIGHER ?*CHILLS OR SWEATING ?*NAUSEA AND VOMITING THAT IS NOT CONTROLLED WITH YOUR NAUSEA MEDICATION ?*UNUSUAL SHORTNESS OF BREATH ?*UNUSUAL BRUISING OR BLEEDING ?*URINARY PROBLEMS (pain or burning when urinating, or frequent urination) ?*BOWEL PROBLEMS (unusual diarrhea, constipation, pain near the anus) ?TENDERNESS IN MOUTH AND THROAT WITH OR WITHOUT PRESENCE OF ULCERS (sore throat, sores in mouth, or a toothache) ?UNUSUAL RASH, SWELLING OR PAIN  ?UNUSUAL VAGINAL DISCHARGE OR ITCHING  ? ?Items with * indicate a potential emergency and should be followed up as soon as possible or go to the Emergency Department if any problems should occur. ? ?Please show the CHEMOTHERAPY ALERT CARD or  IMMUNOTHERAPY ALERT CARD at check-in to the Emergency Department and triage nurse. ? ?Should you have questions after your visit or need to cancel or reschedule your appointment, please contact [facility]  Dept: [phone]  and follow the prompts.  Office hours are 8:00 a.m. to 4:30 p.m. Monday - Friday. Please note that voicemails left after 4:00 p.m. may not be returned until the following business day.  We are closed weekends and major holidays. You have access to a nurse at all times for urgent questions. Please call the main number to the clinic Dept: [phone] and follow the prompts. ? ? ?For any non-urgent questions, you may also contact your provider using MyChart. We now offer e-Visits for anyone 66 and older to request care online for non-urgent symptoms. For details visit [email or web address]. ?  ?Also download the MyChart app! Go to the app store, search "MyChart", open the app, select [facility], and log in with your MyChart username and password. ? ?Due to Covid, a mask is required upon entering the hospital/clinic. If you do not have a mask, one will be given to you upon arrival. For doctor visits, patients may have 1 support person aged 18 or older with them. For treatment visits, patients cannot have anyone with them due to current Covid guidelines and our immunocompromised population.  ? ?

## 2021-05-08 NOTE — Progress Notes (Signed)
[facility] Clinical Social Work ? ?Clinical Social Worker received return call from patient. Pt reports that she began receiving social security disability in January. Since then, her Medicaid is now requiring a deductible as she is just over income and now needs to meet the Medically Needy income limits. This means that she will have to acquire almost $7000 in medical bills every 6 months before Medicaid will become active and begin paying, leaving her with only $242 / month in income. Pt is understandable very stressed about this and tearful on the phone. CSW provided emotional support. ? ?Per pt, Medicaid worker told her to incur the medical bills and then not pay which she is not comfortable with. CSW will reach out to financial team to determine if pt may be eligible for any [facility] assistance.  CSW also gave information on [employer] for pt to explore if there are more affordable plans through the marketplace for her.  ? ?CSW also sent information on cancer foundations ([employer] [place], [place]) which can provide very limited financial support. Currently, pt is living with her boyfriend and he is having to handle all bills due to this issue with insurance and this is not sustainable. ? ? ? ?Ellen [name] , LCSW ?

## 2021-05-08 NOTE — Progress Notes (Signed)
[facility] Follow up: ?  ? ?Pcp, No ?No address on file ? ? ?DIAGNOSIS:  Cancer Staging  ?Carcinoma of breast metastatic to bone [facility]) ?Staging form: Breast, AJCC 8th Edition ?- Clinical stage from 09/03/2020: Stage IV (cT4b, cN1, cM1, G2, ER+, PR+, HER2+) - Signed by Gardenia Phlegm, NP on 10/03/2020 ?Stage prefix: Initial diagnosis ?Method of lymph node assessment: Clinical ?Histologic grading system: 3 grade system ? ? ?SUMMARY OF ONCOLOGIC HISTORY: ?Oncology History  ?Metastatic breast cancer ([place])  ?08/15/2020 Imaging  ? Large mass in the medial left breast measuring 3.5 cm.  Prominent left axillary lymph node cluster of adjacent lymph nodes, irregular nodule right middle lobe 1.1 cm, aggressive lesion right second rib cortical destruction, multiple lucent lesions throughout the thoracic spine including T1 vertebral body and T2, irregular multilobar lesion central left hepatic lobe 4.3 x 4.7 cm.  Several satellite lesions in the left lateral hepatic lobe.  Multiple sclerotic lesions in the bones iliac wings, acetabular, medial iliac bones, sacrum multiple lesions lumbar spine L3-L4 and L5 ?  ?08/17/2020 Initial Diagnosis  ? Biopsy revealed IDC with DCIS grade 2, ER 30%, PR 20%, HER2 equivocal by IHC, FISH positive, Ki-67 25%, lymph node positive ?  ? Genetic Testing  ? Negative genetic testing:  No pathogenic variants detected on the [employer] CancerNext-Expanded + RNAinsight panel. The report date is 09/06/2020.  ? ?The CancerNext-Expanded + RNAinsight gene panel offered by [employer] and includes sequencing and rearrangement analysis for the following 77 genes: AIP, ALK, APC, ATM, AXIN2, BAP1, BARD1, BLM, BMPR1A, BRCA1, BRCA2, BRIP1, CDC73, CDH1, CDK4, CDKN1B, CDKN2A, CHEK2, CTNNA1, DICER1, FANCC, FH, FLCN, GALNT12, KIF1B, LZTR1, MAX, MEN1, MET, MLH1, MSH2, MSH3, MSH6, MUTYH, NBN, NF1, NF2, NTHL1, PALB2, PHOX2B, PMS2, POT1, PRKAR1A, PTCH1, PTEN, RAD51C, RAD51D, RB1, RECQL, RET, SDHA,  SDHAF2, SDHB, SDHC, SDHD, SMAD4, SMARCA4, SMARCB1, SMARCE1, STK11, SUFU, TMEM127, TP53, TSC1, TSC2, VHL and XRCC2 (sequencing and deletion/duplication); EGFR, EGLN1, HOXB13, KIT, MITF, PDGFRA, POLD1 and POLE (sequencing only); EPCAM and GREM1 (deletion/duplication only). RNA data is routinely analyzed for use in variant interpretation for all genes. ?  ?Carcinoma of breast metastatic to bone [facility])  ?08/17/2020 Initial Diagnosis  ? Carcinoma of breast metastatic to bone [facility]); Goserelin/Zoladex started and to be given every 4 weeks ?  ? Genetic Testing  ? Negative genetic testing:  No pathogenic variants detected on the [employer] CancerNext-Expanded + RNAinsight panel. The report date is 09/06/2020.  ? ?The CancerNext-Expanded + RNAinsight gene panel offered by [employer] and includes sequencing and rearrangement analysis for the following 77 genes: AIP, ALK, APC, ATM, AXIN2, BAP1, BARD1, BLM, BMPR1A, BRCA1, BRCA2, BRIP1, CDC73, CDH1, CDK4, CDKN1B, CDKN2A, CHEK2, CTNNA1, DICER1, FANCC, FH, FLCN, GALNT12, KIF1B, LZTR1, MAX, MEN1, MET, MLH1, MSH2, MSH3, MSH6, MUTYH, NBN, NF1, NF2, NTHL1, PALB2, PHOX2B, PMS2, POT1, PRKAR1A, PTCH1, PTEN, RAD51C, RAD51D, RB1, RECQL, RET, SDHA, SDHAF2, SDHB, SDHC, SDHD, SMAD4, SMARCA4, SMARCB1, SMARCE1, STK11, SUFU, TMEM127, TP53, TSC1, TSC2, VHL and XRCC2 (sequencing and deletion/duplication); EGFR, EGLN1, HOXB13, KIT, MITF, PDGFRA, POLD1 and POLE (sequencing only); EPCAM and GREM1 (deletion/duplication only). RNA data is routinely analyzed for use in variant interpretation for all genes. ?  ?08/18/2020 - 08/30/2020 Radiation Therapy  ? Palliative radiation to the lumbar and pelvic bone; 30 Gy in 10 fractions ?  ?08/31/2020 -  Adjuvant Chemotherapy  ? Started with Herceptin/Perjeta on 08/31/2020 and Taxotere on 09/04/2020; she was hospitalized with dehydration and refractory diarrhea for 11 days, she resumed treatment with Taxotere (  dose reduced by 50m/m2) and Herceptin only (perjeta  discontinued) on 09/26/2020, reintroduced 10/17/2020 ?  ?09/03/2020 Cancer Staging  ? Staging form: Breast, AJCC 8th Edition ?- Clinical stage from 09/03/2020: Stage IV (cT4b, cN1, cM1, G2, ER+, PR+, HER2+) - Signed by CGardenia Phlegm NP on 10/03/2020 ?Stage prefix: Initial diagnosis ?Method of lymph node assessment: Clinical ?Histologic grading system: 3 grade system ? ?  ? ? ?CURRENT THERAPY: Zoladex, anastrozole, Herceptin Perjeta ? ?INTERVAL HISTORY: ?Ellen PARILLA26y.o. female returns for follow-up of her metastatic triple positive breast cancer.  She recently had restaging scans completed March 21, 2021.  These showed grossly similar left breast mass and stable metastatic disease.  No new definitive extraskeletal metastatic disease was noted. ? ?Braylin's most recent echocardiogram was completed on March 26, 2021 and showed a left ventricular ejection fraction of 60 to 65% and normal global longitudinal strain. ? ?Ellen [name] doing well today.  She continues to tolerate her treatment well and has no new concerns.  She denies any new pain or discomfort.  She notes she would like to talk to someone about oopherectomy so that she can perhaps forego Zoladex injection. ? ?Her treatments include Herceptin/Perjeta every 4 weeks, Zoladex every 4 weeks and letrozole daily.  She is tolerating these well.   ? ? ?Patient Active Problem List  ? Diagnosis Date Noted  ? AKI (acute kidney injury) ([place] 09/09/2020  ? Genetic testing 09/06/2020  ? Family history of thyroid cancer 08/29/2020  ? Family history of pancreatic cancer 08/29/2020  ? Carcinoma of breast metastatic to bone ([place] 08/17/2020  ? Metastatic breast cancer ([place] 08/16/2020  ? Left breast mass 08/13/2020  ? Genital herpes 08/13/2020  ? Attention deficit hyperactivity disorder (ADHD), 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  ? GAD (generalized anxiety disorder) 02/16/2017  ? Marijuana user 02/16/2017  ? Mild intermittent asthma without  complication 183/[id number] ? Severe episode of recurrent major depressive disorder, without psychotic features ([place] 02/16/2017  ? Depression 04/20/2012  ? ? ?is allergic to other. ? ?MEDICAL HISTORY: ?Past Medical History:  ?Diagnosis Date  ? Anxiety   ? Asthma   ? Exercise Induced  ? Cancer ([facility]   ? Depression   ? Family history of pancreatic cancer   ? Family history of thyroid cancer   ? Herpes simplex   ? Personal history of chemotherapy 08/2020  ? ? ?SURGICAL HISTORY: ?Past Surgical History:  ?Procedure Laterality Date  ? BREAST BIOPSY Left 08/2020  ? NO PAST SURGERIES    ? PORTACATH PLACEMENT Right 08/23/2020  ? Procedure: INSERTION PORT-A-CATH;  Surgeon: TDonnie Mesa MD;  [place] [facility]  Service: General;  Laterality: Right;  ? ? ?SOCIAL HISTORY: ?Social History  ? ?Socioeconomic History  ? Marital status: Single  ?  Spouse name: Not on file  ? Number of children: Not on file  ? Years of education: Not on file  ? Highest education level: Not on file  ?Occupational History  ? Not on file  ?Tobacco Use  ? Smoking status: Never  ? Smokeless tobacco: Never  ?Vaping Use  ? Vaping Use: Every day  ? Substances: THC  ?Substance and Sexual Activity  ? Alcohol use: Yes  ?  Alcohol/week: 7.0 standard drinks  ?  Types: 7 Glasses of wine per week  ? Drug use: Yes  ?  Types: Marijuana  ? Sexual activity: Yes  ?  Birth control/protection: I.U.D.  ?Other Topics Concern  ? Not on file  ?  Social History Narrative  ? Menarche at age 34, has a cycles every 28 days that lasts about 11 days, 4 days of spotting, two days of heavy flow, and the other days mild to moderate flow.  Received Depo provera shot x 3 years, Nexplanon x 2 years, and currently has kyleena IUD.  No OCPs.  She has never been pregnant.   ? ?Social Determinants of Health  ? ?Financial Resource Strain: High Risk  ? Difficulty of Paying Living Expenses: Very hard  ?Food Insecurity: No Food Insecurity  ? Worried About Charity fundraiser in  the Last Year: Never true  ? Ran Out of Food in the Last Year: Never true  ?Transportation Needs: No Transportation Needs  ? Lack of Transportation (Medical): No  ? Lack of Transportation (Non-Medical): No  ?Physical Ac

## 2021-05-08 NOTE — Telephone Encounter (Signed)
[place] ?Clinical Social Work ? ?Clinical Social Work was referred by medical provider for assessment of psychosocial needs.  Clinical Social Worker  attempted to contact pt by phone   to offer support and assess for needs.   ?No answer. Left VM. ? ? ? ? ?Kayden Amend E Shawnn Bouillon, LCSW  ?Clinical Social Worker ?[place] ?      ? ?

## 2021-05-09 ENCOUNTER — Telehealth: Payer: Self-pay | Admitting: *Deleted

## 2021-05-09 NOTE — Telephone Encounter (Signed)
Spoke with the Ellen [name] and scheduled a new Ellen [name] appt with Dr Berline Lopes on 4/6. Ellen [name] aware of the address and phone number for the clinic ?

## 2021-05-12 ENCOUNTER — Encounter: Payer: Self-pay | Admitting: Hematology and Oncology

## 2021-05-14 ENCOUNTER — Ambulatory Visit: Payer: Medicaid Other

## 2021-05-14 ENCOUNTER — Ambulatory Visit: Payer: Medicaid Other | Admitting: Hematology and Oncology

## 2021-05-14 ENCOUNTER — Other Ambulatory Visit: Payer: Medicaid Other

## 2021-05-19 ENCOUNTER — Encounter: Payer: Self-pay | Admitting: Hematology and Oncology

## 2021-05-20 ENCOUNTER — Other Ambulatory Visit (HOSPITAL_COMMUNITY): Payer: Self-pay

## 2021-05-23 ENCOUNTER — Other Ambulatory Visit: Payer: Self-pay

## 2021-05-23 ENCOUNTER — Encounter: Payer: Self-pay | Admitting: Gynecologic Oncology

## 2021-05-23 ENCOUNTER — Inpatient Hospital Stay: Payer: Medicaid Other | Attending: Adult Health | Admitting: Gynecologic Oncology

## 2021-05-23 VITALS — BP 122/70 | HR 113 | Temp 98.6°F | Resp 16 | Ht 65.75 in | Wt 139.8 lb

## 2021-05-23 DIAGNOSIS — R232 Flushing: Secondary | ICD-10-CM | POA: Diagnosis not present

## 2021-05-23 DIAGNOSIS — Z170 Estrogen receptor positive status [ER+]: Secondary | ICD-10-CM | POA: Diagnosis not present

## 2021-05-23 DIAGNOSIS — C7951 Secondary malignant neoplasm of bone: Secondary | ICD-10-CM | POA: Insufficient documentation

## 2021-05-23 DIAGNOSIS — R3915 Urgency of urination: Secondary | ICD-10-CM

## 2021-05-23 DIAGNOSIS — C50912 Malignant neoplasm of unspecified site of left female breast: Secondary | ICD-10-CM

## 2021-05-23 DIAGNOSIS — N941 Unspecified dyspareunia: Secondary | ICD-10-CM | POA: Diagnosis not present

## 2021-05-23 DIAGNOSIS — Z5111 Encounter for antineoplastic chemotherapy: Secondary | ICD-10-CM | POA: Insufficient documentation

## 2021-05-23 DIAGNOSIS — N951 Menopausal and female climacteric states: Secondary | ICD-10-CM

## 2021-05-23 DIAGNOSIS — C50812 Malignant neoplasm of overlapping sites of left female breast: Secondary | ICD-10-CM

## 2021-05-23 DIAGNOSIS — Z79810 Long term (current) use of selective estrogen receptor modulators (SERMs): Secondary | ICD-10-CM | POA: Diagnosis not present

## 2021-05-23 DIAGNOSIS — D0512 Intraductal carcinoma in situ of left breast: Secondary | ICD-10-CM | POA: Insufficient documentation

## 2021-05-23 DIAGNOSIS — Z79899 Other long term (current) drug therapy: Secondary | ICD-10-CM

## 2021-05-23 DIAGNOSIS — Z5112 Encounter for antineoplastic immunotherapy: Secondary | ICD-10-CM | POA: Insufficient documentation

## 2021-05-23 DIAGNOSIS — C50919 Malignant neoplasm of unspecified site of unspecified female breast: Secondary | ICD-10-CM

## 2021-05-23 NOTE — Patient Instructions (Signed)
It was nice to meet you today. ? ?We will tentatively plan to schedule surgery on 5/30.  We will reach out to your medical oncologist about your various cancer treatments and the need to hold them prior to and after surgery. ? ?Our plan will be for minimally invasive surgery (robotic surgery), to remove your fallopian tubes and ovaries. ? ?You will return closer to the date of surgery to have a preoperative visit with Joylene John, our nurse practitioner.  If you have any questions between now and then, please call the office at [phone]. ?

## 2021-05-23 NOTE — Progress Notes (Signed)
GYNECOLOGIC ONCOLOGY NEW PATIENT CONSULTATION  ? ?Patient Name: Ellen [name]  ?Patient Age: 26 y.o. ?Date of Service: 05/23/2021 ?Referring Provider: Gardenia Phlegm, NP ?[place] ?[place],   [id number]  ? ?Primary Care Provider: Pcp, No ?Consulting Provider: Jeral Pinch, MD  ? ?Assessment/Plan:  ?Premenopausal patient with metastatic estrogen receptor positive breast cancer. ? ?Patient is overall doing well from a breast cancer treatment standpoint.  She is interested in options other than chemo suppression of her ovaries.  She has some cyclic symptoms that make me think she may be having a little bit of breakthrough with regard to the medication.  We discussed that options for ovarian suppression include chemo suppression (as she is currently receiving) or surgical excision of the ovaries.  Both lead to menopausal state and menopausal side effects.  She is having some hot flashes as well as dyspareunia which I suspect is related to decreased vaginal lubrication.  She was given some samples of vaginal lubricants today.  We also discussed referral to pelvic floor physical therapy if she does not notice improvement with increased use to vaginal lubricants. ? ?We discussed what surgery would entail, with plan for minimally invasive surgery to remove fallopian tubes and ovaries.  There is no medical indication to remove the patient's uterus given her current antiestrogen treatment.  Even in the setting of someone who is on tamoxifen, hysterectomy can be considered but is not necessary.  In terms of the medications that she is on with Herceptin and Perjeta, I do not think that we need to disrupt her schedule for surgery.  My office will reach out to her medical oncologist to confirm this, but I would suggest that we plan for surgery at the midway point between injections (2 weeks after her last injection in 2 weeks before her next). ? ?We discussed risks of surgery which include but are  not limited to bleeding, need for blood transfusion, infection, damage to surrounding structures, anesthesia complications, VTE, and rarely death.  Surgery will be performed robotically and will be outpatient. ? ?In terms of gynecologic health maintenance, given most recent Pap smear, she will need repeat cotesting in 3 years. ? ?We will tentatively plan for surgery at the end of May.  She will return closer to the date of surgery for a preoperative visit with [facility]. ? ?A copy of this note was sent to the patient's referring provider.  ? ?50 minutes of total time was spent for this patient encounter, including preparation, face-to-face counseling with the patient and coordination of care, and documentation of the encounter. ? ? ?Jeral Pinch, MD  ?Division of Gynecologic Oncology  ?Department of Obstetrics and Gynecology  ?[facility]  ?___________________________________________  ?Chief Complaint: ?Chief Complaint  ?Patient presents with  ? Primary malignant neoplasm of breast with metastasis ([place])  ? ? ?History of Present Illness:  ?Ellen [name] is a 26 y.o. y.o. female who is seen in consultation at the request of Ellen [name]* for an evaluation of surgical ovarian suppression in the setting of triple positive breast cancer. ? 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  She completed palliative radiation in July 2022 and Taxotere/Herceptin/Perjeta in November.  She has been on Herceptin/Perjeta maintenance since the end of November 2022.  She is on Zoladex for suppression of her ovaries. She recently had restaging scans completed March 21, 2021.  These showed grossly similar left breast mass and stable metastatic  disease.  No new definitive extraskeletal metastatic disease was noted. ? ?She notes overall doing well.  On Zoladex, she has experienced some dyspareunia as well as hot flashes.  She uses Effexor  at night which helps with her hot sweats.  Appetite has been improving, she denies any nausea or emesis.  Bowel function has improved significantly since January and she reports that it is back to baseline.  She has some urinary urgency and frequency although these have also improved since she has completed Taxotere. ? ?She denies any vaginal bleeding since being on Zoladex.  Prior to this, she notes that her menses were somewhat heavy and would last 7 to 9 days.  She has previously been on Depo, use the Nexplanon, and most recently was on birth control pills.  Her menses were longer but less heavy on oral contraceptive pills.  Now, although she does not have any menses, she has occasional cramping around the time of her Zoladex injection. ? ?Patient lives in [place].  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  Her mother lives nearby.  She endorses some social alcohol use.  Uses THC to help with appetite as well as side effects related to cancer treatment. ? ?Treatment History: ?Oncology History  ?Primary malignant neoplasm of breast with metastasis ([place])  ?08/15/2020 Imaging  ? Large mass in the medial left breast measuring 3.5 cm.  Prominent left axillary lymph node cluster of adjacent lymph nodes, irregular nodule right middle lobe 1.1 cm, aggressive lesion right second rib cortical destruction, multiple lucent lesions throughout the thoracic spine including T1 vertebral body and T2, irregular multilobar lesion central left hepatic lobe 4.3 x 4.7 cm.  Several satellite lesions in the left lateral hepatic lobe.  Multiple sclerotic lesions in the bones iliac wings, acetabular, medial iliac bones, sacrum multiple lesions lumbar spine L3-L4 and L5 ?  ?08/17/2020 Initial Diagnosis  ? Biopsy revealed IDC with DCIS grade 2, ER 30%, PR 20%, HER2 equivocal by IHC, FISH positive, Ki-67 25%, lymph node positive ?  ? Genetic Testing  ? Negative genetic testing:  No pathogenic variants detected on the [employer] CancerNext-Expanded  + RNAinsight panel. The report date is 09/06/2020.  ? ?The CancerNext-Expanded + RNAinsight gene panel offered by [employer] and includes sequencing and rearrangement analysis for the following 77 genes: AIP, ALK, APC, ATM, AXIN2, BAP1, BARD1, BLM, BMPR1A, BRCA1, BRCA2, BRIP1, CDC73, CDH1, CDK4, CDKN1B, CDKN2A, CHEK2, CTNNA1, DICER1, FANCC, FH, FLCN, GALNT12, KIF1B, LZTR1, MAX, MEN1, MET, MLH1, MSH2, MSH3, MSH6, MUTYH, NBN, NF1, NF2, NTHL1, PALB2, PHOX2B, PMS2, POT1, PRKAR1A, PTCH1, PTEN, RAD51C, RAD51D, RB1, RECQL, RET, SDHA, SDHAF2, SDHB, SDHC, SDHD, SMAD4, SMARCA4, SMARCB1, SMARCE1, STK11, SUFU, TMEM127, TP53, TSC1, TSC2, VHL and XRCC2 (sequencing and deletion/duplication); EGFR, EGLN1, HOXB13, KIT, MITF, PDGFRA, POLD1 and POLE (sequencing only); EPCAM and GREM1 (deletion/duplication only). RNA data is routinely analyzed for use in variant interpretation for all genes. ?  ?Carcinoma of breast metastatic to bone [facility])  ?08/17/2020 Initial Diagnosis  ? Carcinoma of breast metastatic to bone [facility]); Goserelin/Zoladex started and to be given every 4 weeks ?  ? Genetic Testing  ? Negative genetic testing:  No pathogenic variants detected on the [employer] CancerNext-Expanded + RNAinsight panel. The report date is 09/06/2020.  ? ?The CancerNext-Expanded + RNAinsight gene panel offered by [employer] and includes sequencing and rearrangement analysis for the following 77 genes: AIP, ALK, APC, ATM, AXIN2, BAP1, BARD1, BLM, BMPR1A, BRCA1, BRCA2, BRIP1, CDC73, CDH1, CDK4, CDKN1B, CDKN2A, CHEK2, CTNNA1, DICER1, FANCC, FH,  FLCN, GALNT12, KIF1B, LZTR1, MAX, MEN1, MET, MLH1, MSH2, MSH3, MSH6, MUTYH, NBN, NF1, NF2, NTHL1, PALB2, PHOX2B, PMS2, POT1, PRKAR1A, PTCH1, PTEN, RAD51C, RAD51D, RB1, RECQL, RET, SDHA, SDHAF2, SDHB, SDHC, SDHD, SMAD4, SMARCA4, SMARCB1, SMARCE1, STK11, SUFU, TMEM127, TP53, TSC1, TSC2, VHL and XRCC2 (sequencing and deletion/duplication); EGFR, EGLN1, HOXB13, KIT, MITF, PDGFRA, POLD1 and POLE (sequencing only);  EPCAM and GREM1 (deletion/duplication only). RNA data is routinely analyzed for use in variant interpretation for all genes. ?  ?08/18/2020 - 08/30/2020 Radiation Therapy  ? Palliative radiation to the [facility]

## 2021-05-27 ENCOUNTER — Other Ambulatory Visit: Payer: Self-pay | Admitting: Gynecologic Oncology

## 2021-05-27 DIAGNOSIS — C50919 Malignant neoplasm of unspecified site of unspecified female breast: Secondary | ICD-10-CM

## 2021-06-03 ENCOUNTER — Encounter: Payer: Self-pay | Admitting: Hematology and Oncology

## 2021-06-03 ENCOUNTER — Telehealth: Payer: Self-pay | Admitting: *Deleted

## 2021-06-03 DIAGNOSIS — Z95828 Presence of other vascular implants and grafts: Secondary | ICD-10-CM | POA: Insufficient documentation

## 2021-06-03 NOTE — Telephone Encounter (Signed)
Speak with pt this afternoon to inform her that per Dr.Gudena pt can hold the Anastrozole 3 days before surgery and restart it 3 days after surgery. She can continue the Herceptin as long as it does not fall on day of surgery or day after surgery. Also scheduled a port flush appointment for pt on 5/23 to have her port accessed so she can get her labs drawn at the main hospital. Pt verbalized understanding.  ?

## 2021-06-04 ENCOUNTER — Inpatient Hospital Stay: Payer: Medicaid Other

## 2021-06-04 ENCOUNTER — Other Ambulatory Visit: Payer: Self-pay

## 2021-06-04 ENCOUNTER — Other Ambulatory Visit: Payer: Self-pay | Admitting: *Deleted

## 2021-06-04 ENCOUNTER — Ambulatory Visit: Payer: Medicaid Other

## 2021-06-04 ENCOUNTER — Other Ambulatory Visit: Payer: Medicaid Other

## 2021-06-04 VITALS — BP 112/70 | HR 71 | Temp 98.3°F | Resp 16 | Wt 141.8 lb

## 2021-06-04 DIAGNOSIS — C50919 Malignant neoplasm of unspecified site of unspecified female breast: Secondary | ICD-10-CM

## 2021-06-04 DIAGNOSIS — C50912 Malignant neoplasm of unspecified site of left female breast: Secondary | ICD-10-CM

## 2021-06-04 DIAGNOSIS — Z95828 Presence of other vascular implants and grafts: Secondary | ICD-10-CM

## 2021-06-04 DIAGNOSIS — Z5112 Encounter for antineoplastic immunotherapy: Secondary | ICD-10-CM | POA: Diagnosis present

## 2021-06-04 DIAGNOSIS — C7951 Secondary malignant neoplasm of bone: Secondary | ICD-10-CM | POA: Diagnosis not present

## 2021-06-04 DIAGNOSIS — Z5111 Encounter for antineoplastic chemotherapy: Secondary | ICD-10-CM | POA: Diagnosis present

## 2021-06-04 DIAGNOSIS — D0512 Intraductal carcinoma in situ of left breast: Secondary | ICD-10-CM | POA: Diagnosis not present

## 2021-06-04 LAB — CBC WITH DIFFERENTIAL (CANCER CENTER ONLY)
Abs Immature Granulocytes: 0.02 10*3/uL (ref 0.00–0.07)
Basophils Absolute: 0 10*3/uL (ref 0.0–0.1)
Basophils Relative: 0 %
Eosinophils Absolute: 0.1 10*3/uL (ref 0.0–0.5)
Eosinophils Relative: 2 %
HCT: 38.2 % (ref 36.0–46.0)
Hemoglobin: 12.6 g/dL (ref 12.0–15.0)
Immature Granulocytes: 0 %
Lymphocytes Relative: 21 %
Lymphs Abs: 1.4 10*3/uL (ref 0.7–4.0)
MCH: 29.2 pg (ref 26.0–34.0)
MCHC: 33 g/dL (ref 30.0–36.0)
MCV: 88.6 fL (ref 80.0–100.0)
Monocytes Absolute: 0.4 10*3/uL (ref 0.1–1.0)
Monocytes Relative: 6 %
Neutro Abs: 4.5 10*3/uL (ref 1.7–7.7)
Neutrophils Relative %: 71 %
Platelet Count: 278 10*3/uL (ref 150–400)
RBC: 4.31 MIL/uL (ref 3.87–5.11)
RDW: 13 % (ref 11.5–15.5)
WBC Count: 6.4 10*3/uL (ref 4.0–10.5)
nRBC: 0 % (ref 0.0–0.2)

## 2021-06-04 LAB — CMP (CANCER CENTER ONLY)
ALT: 13 U/L (ref 0–44)
AST: 12 U/L — ABNORMAL LOW (ref 15–41)
Albumin: 4.1 g/dL (ref 3.5–5.0)
Alkaline Phosphatase: 63 U/L (ref 38–126)
Anion gap: 6 (ref 5–15)
BUN: 10 mg/dL (ref 6–20)
CO2: 25 mmol/L (ref 22–32)
Calcium: 8.7 mg/dL — ABNORMAL LOW (ref 8.9–10.3)
Chloride: 107 mmol/L (ref 98–111)
Creatinine: 0.66 mg/dL (ref 0.44–1.00)
GFR, Estimated: >60 mL/min (ref >60)
Glucose, Bld: 116 mg/dL — ABNORMAL HIGH (ref 70–99)
Potassium: 3.7 mmol/L (ref 3.5–5.1)
Sodium: 138 mmol/L (ref 135–145)
Total Bilirubin: 0.4 mg/dL (ref 0.3–1.2)
Total Protein: 6.9 g/dL (ref 6.5–8.1)

## 2021-06-04 MED ORDER — TRASTUZUMAB-DKST CHEMO 150 MG IV SOLR
6.0000 mg/kg | Freq: Once | INTRAVENOUS | Status: AC
  Administered 2021-06-04: 378 mg via INTRAVENOUS
  Filled 2021-06-04: qty 18

## 2021-06-04 MED ORDER — SODIUM CHLORIDE 0.9 % IV SOLN
420.0000 mg | Freq: Once | INTRAVENOUS | Status: AC
  Administered 2021-06-04: 420 mg via INTRAVENOUS
  Filled 2021-06-04: qty 14

## 2021-06-04 MED ORDER — SODIUM CHLORIDE 0.9% FLUSH
10.0000 mL | Freq: Once | INTRAVENOUS | Status: AC
  Administered 2021-06-04: 10 mL

## 2021-06-04 MED ORDER — GOSERELIN ACETATE 3.6 MG ~~LOC~~ IMPL
3.6000 mg | DRUG_IMPLANT | Freq: Once | SUBCUTANEOUS | Status: AC
  Administered 2021-06-04: 3.6 mg via SUBCUTANEOUS
  Filled 2021-06-04: qty 3.6

## 2021-06-04 MED ORDER — DIPHENHYDRAMINE HCL 25 MG PO CAPS
25.0000 mg | ORAL_CAPSULE | Freq: Once | ORAL | Status: AC
  Administered 2021-06-04: 25 mg via ORAL
  Filled 2021-06-04: qty 1

## 2021-06-04 MED ORDER — SODIUM CHLORIDE 0.9 % IV SOLN: Freq: Once | INTRAVENOUS | Status: AC

## 2021-06-04 MED ORDER — SODIUM CHLORIDE 0.9% FLUSH
10.0000 mL | INTRAVENOUS | Status: DC | PRN
  Administered 2021-06-04: 10 mL

## 2021-06-04 MED ORDER — ACETAMINOPHEN 325 MG PO TABS
650.0000 mg | ORAL_TABLET | Freq: Once | ORAL | Status: AC
  Administered 2021-06-04: 650 mg via ORAL
  Filled 2021-06-04: qty 2

## 2021-06-04 MED ORDER — HEPARIN SOD (PORK) LOCK FLUSH 100 UNIT/ML IV SOLN
500.0000 [IU] | Freq: Once | INTRAVENOUS | Status: AC | PRN
  Administered 2021-06-04: 500 [IU]

## 2021-06-05 ENCOUNTER — Other Ambulatory Visit: Payer: Medicaid Other

## 2021-06-05 ENCOUNTER — Ambulatory Visit: Payer: Medicaid Other

## 2021-06-10 ENCOUNTER — Encounter: Payer: Self-pay | Admitting: Adult Health

## 2021-06-11 ENCOUNTER — Other Ambulatory Visit: Payer: Self-pay

## 2021-06-11 DIAGNOSIS — C50912 Malignant neoplasm of unspecified site of left female breast: Secondary | ICD-10-CM

## 2021-06-12 ENCOUNTER — Other Ambulatory Visit (HOSPITAL_COMMUNITY): Payer: Self-pay

## 2021-06-14 ENCOUNTER — Other Ambulatory Visit (HOSPITAL_COMMUNITY): Payer: Self-pay

## 2021-06-18 NOTE — Progress Notes (Signed)
? ?Patient Care Team: ?Pcp, No as PCP - General ?[name], MD as Consulting Physician (Hematology and Oncology) ?Gardenia Phlegm, NP as Nurse Practitioner (Hematology and Oncology) ?[name], MD as Consulting Physician (General Surgery) ? ?DIAGNOSIS:  ?Encounter Diagnosis  ?Name Primary?  ? 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 ? ?SUMMARY OF ONCOLOGIC HISTORY: ?Oncology History  ?Primary malignant neoplasm of breast with metastasis ([place])  ?08/15/2020 Imaging  ? Large mass in the medial left breast measuring 3.5 cm.  Prominent left axillary lymph node cluster of adjacent lymph nodes, irregular nodule right middle lobe 1.1 cm, aggressive lesion right second rib cortical destruction, multiple lucent lesions throughout the thoracic spine including T1 vertebral body and T2, irregular multilobar lesion central left hepatic lobe 4.3 x 4.7 cm.  Several satellite lesions in the left lateral hepatic lobe.  Multiple sclerotic lesions in the bones iliac wings, acetabular, medial iliac bones, sacrum multiple lesions lumbar spine L3-L4 and L5 ?  ?08/17/2020 Initial Diagnosis  ? Biopsy revealed IDC with DCIS grade 2, ER 30%, PR 20%, HER2 equivocal by IHC, FISH positive, Ki-67 25%, lymph node positive ?  ? Genetic Testing  ? Negative genetic testing:  No pathogenic variants detected on the [employer] CancerNext-Expanded + RNAinsight panel. The report date is 09/06/2020.  ? ?The CancerNext-Expanded + RNAinsight gene panel offered by [employer] and includes sequencing and rearrangement analysis for the following 77 genes: AIP, ALK, APC, ATM, AXIN2, BAP1, BARD1, BLM, BMPR1A, BRCA1, BRCA2, BRIP1, CDC73, CDH1, CDK4, CDKN1B, CDKN2A, CHEK2, CTNNA1, DICER1, FANCC, FH, FLCN, GALNT12, KIF1B, LZTR1, MAX, MEN1, MET, MLH1, MSH2, MSH3, MSH6, MUTYH, NBN, NF1, NF2, NTHL1, PALB2, PHOX2B, PMS2, POT1, PRKAR1A, PTCH1, PTEN, RAD51C, RAD51D, RB1, RECQL, RET, SDHA, SDHAF2, SDHB, SDHC, SDHD, SMAD4, SMARCA4, SMARCB1,  SMARCE1, STK11, SUFU, TMEM127, TP53, TSC1, TSC2, VHL and XRCC2 (sequencing and deletion/duplication); EGFR, EGLN1, HOXB13, KIT, MITF, PDGFRA, POLD1 and POLE (sequencing only); EPCAM and GREM1 (deletion/duplication only). RNA data is routinely analyzed for use in variant interpretation for all genes. ?  ?Carcinoma of breast metastatic to bone [facility])  ?08/17/2020 Initial Diagnosis  ? Carcinoma of breast metastatic to bone [facility]); Goserelin/Zoladex started and to be given every 4 weeks ?  ? Genetic Testing  ? Negative genetic testing:  No pathogenic variants detected on the [employer] CancerNext-Expanded + RNAinsight panel. The report date is 09/06/2020.  ? ?The CancerNext-Expanded + RNAinsight gene panel offered by [employer] and includes sequencing and rearrangement analysis for the following 77 genes: AIP, ALK, APC, ATM, AXIN2, BAP1, BARD1, BLM, BMPR1A, BRCA1, BRCA2, BRIP1, CDC73, CDH1, CDK4, CDKN1B, CDKN2A, CHEK2, CTNNA1, DICER1, FANCC, FH, FLCN, GALNT12, KIF1B, LZTR1, MAX, MEN1, MET, MLH1, MSH2, MSH3, MSH6, MUTYH, NBN, NF1, NF2, NTHL1, PALB2, PHOX2B, PMS2, POT1, PRKAR1A, PTCH1, PTEN, RAD51C, RAD51D, RB1, RECQL, RET, SDHA, SDHAF2, SDHB, SDHC, SDHD, SMAD4, SMARCA4, SMARCB1, SMARCE1, STK11, SUFU, TMEM127, TP53, TSC1, TSC2, VHL and XRCC2 (sequencing and deletion/duplication); EGFR, EGLN1, HOXB13, KIT, MITF, PDGFRA, POLD1 and POLE (sequencing only); EPCAM and GREM1 (deletion/duplication only). RNA data is routinely analyzed for use in variant interpretation for all genes. ?  ?08/18/2020 - 08/30/2020 Radiation Therapy  ? Palliative radiation to the lumbar and pelvic bone; 30 Gy in 10 fractions ?  ?08/31/2020 -  Adjuvant Chemotherapy  ? Started with Herceptin/Perjeta on 08/31/2020 and Taxotere on 09/04/2020; she was hospitalized with dehydration and refractory diarrhea for 11 days, she resumed treatment with Taxotere (dose reduced by 6m/m2) and Herceptin only (perjeta discontinued) on 09/26/2020, reintroduced 10/17/2020 ?   ?  09/03/2020 Cancer Staging  ? Staging form: Breast, AJCC 8th Edition ?- Clinical stage from 09/03/2020: Stage IV (cT4b, cN1, cM1, G2, ER+, PR+, HER2+) - Signed by Gardenia Phlegm, NP on 10/03/2020 ?Stage prefix: Initial diagnosis ?Method of lymph node assessment: Clinical ?Histologic grading system: 3 grade system ? ?  ? ? ?CHIEF COMPLIANT: Herceptin and Perjeta, complaining of worsening left breast lump ? ?INTERVAL HISTORY: Ellen [name] is a 26 y.o. with above-mentioned history of metastatic left breast cancer, currently on  Herceptin Perjeta. She reports to the clinic today for treatment.  She is complained of the left breast lump appears to be getting worse with subcutaneous nodules.  She underwent a mammogram and ultrasound in March which continue to show abnormalities in the left breast.  Because of metastatic disease no local interventions were performed.  Although she feels well and is doing well she is very concerned that the tumor is not responding.  She had a sore throat last week and today she is starting to feel better. ? ? ?ALLERGIES:  is allergic to other. ? ?MEDICATIONS:  ?Current Outpatient Medications  ?Medication Sig Dispense Refill  ? acyclovir (ZOVIRAX) 800 MG tablet Take 1 tablet (800 mg total) by mouth 2 (two) times daily as needed (flare up). 60 tablet 5  ? acyclovir ointment (ZOVIRAX) 5 % Apply 1 application topically 4 (four) times daily as needed. outbreak    ? anastrozole (ARIMIDEX) 1 MG tablet Take 1 tablet (1 mg total) by mouth daily. 90 tablet 3  ? cholestyramine (QUESTRAN) 4 g packet Take 1 packet mixed with liquid by mouth 2 times daily as needed. 60 each 12  ? lidocaine-prilocaine (EMLA) cream Apply to affected area once 30 g 3  ? LORazepam (ATIVAN) 0.5 MG tablet Take 1 tablet (0.5 mg total) by mouth at bedtime as needed for sleep. 30 tablet 0  ? METROGEL 1 % gel Apply topically daily. 45 g 0  ? Multiple Vitamin (MULTIVITAMINS PO) Take 1 tablet by mouth daily.    ?  ondansetron (ZOFRAN) 8 MG tablet Take 1 tablet (8 mg total) by mouth 2 (two) times daily as needed for refractory nausea / vomiting. 30 tablet 1  ? oxyCODONE-acetaminophen (PERCOCET/ROXICET) 5-325 MG tablet Take 1 tablet by mouth every 8 (eight) hours as needed for severe pain. 60 tablet 0  ? prochlorperazine (COMPAZINE) 10 MG tablet Take 1 tablet (10 mg total) by mouth every 6 (six) hours as needed for nausea or vomiting. 30 tablet 1  ? venlafaxine XR (EFFEXOR-XR) 150 MG 24 hr capsule Take 1 capsule (150 mg total) by mouth daily with breakfast. 30 capsule 6  ? VYVANSE 40 MG capsule Take 40 mg by mouth daily.    ? ?No current facility-administered medications for this visit.  ? ? ?PHYSICAL EXAMINATION: ?ECOG PERFORMANCE STATUS: 1 - Symptomatic but completely ambulatory ? ?Vitals:  ? 07/02/21 1041  ?BP: 130/84  ?Pulse: (!) 103  ?Resp: 19  ?Temp: 97.7 ?F (36.5 ?C)  ?SpO2: 100%  ? ?Filed Weights  ? 07/02/21 1041  ?Weight: 143 lb (64.9 kg)  ? ?  ? ?LABORATORY DATA:  ?I have reviewed the data as listed ? ?  Latest Ref Rng & Units 07/02/2021  ? 10:15 AM 06/04/2021  ? 10:34 AM 05/08/2021  ?  9:25 AM  ?CMP  ?Glucose 70 - 99 mg/dL 112   116   112    ?BUN 6 - 20 mg/dL 10   10   16     ?  Creatinine 0.44 - 1.00 mg/dL 0.78   0.66   0.82    ?Sodium 135 - 145 mmol/L 140   138   139    ?Potassium 3.5 - 5.1 mmol/L 4.5   3.7   4.1    ?Chloride 98 - 111 mmol/L 106   107   108    ?CO2 22 - 32 mmol/L 29   25   26     ?Calcium 8.9 - 10.3 mg/dL 9.0   8.7   9.4    ?Total Protein 6.5 - 8.1 g/dL 6.8   6.9   6.9    ?Total Bilirubin 0.3 - 1.2 mg/dL 0.3   0.4   0.6    ?Alkaline Phos 38 - 126 U/L 72   63   72    ?AST 15 - 41 U/L 13   12   15     ?ALT 0 - 44 U/L 15   13   16     ? ? ?Lab Results  ?Component Value Date  ? WBC 8.9 07/02/2021  ? HGB 11.9 (L) 07/02/2021  ? HCT 35.8 (L) 07/02/2021  ? MCV 88.4 07/02/2021  ? PLT 346 07/02/2021  ? NEUTROABS 6.4 07/02/2021  ? ? ?ASSESSMENT & PLAN:  ?Carcinoma of breast metastatic to bone [facility]) ?08/15/2020: CT CAP:  Breast mass 3.5 cm, left axillary lymph node, multiple bone metastases, extensive liver metastases, small lung nodules ?  ?Biopsy revealed IDC with DCIS grade 2, ER 30%, PR 20%, HER2 equivocal by IHC, FISH positive ratio

## 2021-06-20 ENCOUNTER — Other Ambulatory Visit (HOSPITAL_COMMUNITY): Payer: Self-pay

## 2021-06-26 ENCOUNTER — Ambulatory Visit (HOSPITAL_COMMUNITY)
Admission: RE | Admit: 2021-06-26 | Discharge: 2021-06-26 | Disposition: A | Discharge status: Home / Self Care | Payer: Medicaid Other | Source: Ambulatory Visit | Attending: Adult Health | Admitting: Adult Health

## 2021-06-26 DIAGNOSIS — C7951 Secondary malignant neoplasm of bone: Secondary | ICD-10-CM

## 2021-06-26 DIAGNOSIS — Z0189 Encounter for other specified special examinations: Secondary | ICD-10-CM | POA: Diagnosis not present

## 2021-06-26 DIAGNOSIS — C50912 Malignant neoplasm of unspecified site of left female breast: Secondary | ICD-10-CM | POA: Diagnosis not present

## 2021-06-26 LAB — ECHOCARDIOGRAM COMPLETE
Area-P 1/2: 5.46 cm2
S' Lateral: 1.9 cm

## 2021-07-02 ENCOUNTER — Inpatient Hospital Stay (HOSPITAL_BASED_OUTPATIENT_CLINIC_OR_DEPARTMENT_OTHER): Payer: Medicaid Other | Admitting: Hematology and Oncology

## 2021-07-02 ENCOUNTER — Other Ambulatory Visit: Payer: Self-pay

## 2021-07-02 ENCOUNTER — Encounter: Payer: Self-pay | Admitting: Gynecologic Oncology

## 2021-07-02 ENCOUNTER — Ambulatory Visit: Payer: Medicaid Other

## 2021-07-02 ENCOUNTER — Inpatient Hospital Stay: Payer: Medicaid Other | Attending: Adult Health

## 2021-07-02 ENCOUNTER — Other Ambulatory Visit (HOSPITAL_COMMUNITY): Payer: Self-pay

## 2021-07-02 ENCOUNTER — Inpatient Hospital Stay: Payer: Medicaid Other

## 2021-07-02 VITALS — BP 124/76 | HR 90 | Temp 97.7°F | Resp 16

## 2021-07-02 DIAGNOSIS — R918 Other nonspecific abnormal finding of lung field: Secondary | ICD-10-CM | POA: Diagnosis not present

## 2021-07-02 DIAGNOSIS — Z170 Estrogen receptor positive status [ER+]: Secondary | ICD-10-CM | POA: Insufficient documentation

## 2021-07-02 DIAGNOSIS — C50912 Malignant neoplasm of unspecified site of left female breast: Secondary | ICD-10-CM | POA: Diagnosis not present

## 2021-07-02 DIAGNOSIS — C50919 Malignant neoplasm of unspecified site of unspecified female breast: Secondary | ICD-10-CM | POA: Diagnosis not present

## 2021-07-02 DIAGNOSIS — G629 Polyneuropathy, unspecified: Secondary | ICD-10-CM | POA: Diagnosis not present

## 2021-07-02 DIAGNOSIS — C7951 Secondary malignant neoplasm of bone: Secondary | ICD-10-CM

## 2021-07-02 DIAGNOSIS — C787 Secondary malignant neoplasm of liver and intrahepatic bile duct: Secondary | ICD-10-CM | POA: Diagnosis not present

## 2021-07-02 DIAGNOSIS — Z95828 Presence of other vascular implants and grafts: Secondary | ICD-10-CM

## 2021-07-02 DIAGNOSIS — Z5111 Encounter for antineoplastic chemotherapy: Secondary | ICD-10-CM | POA: Insufficient documentation

## 2021-07-02 DIAGNOSIS — Z5112 Encounter for antineoplastic immunotherapy: Secondary | ICD-10-CM | POA: Insufficient documentation

## 2021-07-02 DIAGNOSIS — C50212 Malignant neoplasm of upper-inner quadrant of left female breast: Secondary | ICD-10-CM | POA: Diagnosis present

## 2021-07-02 LAB — CBC WITH DIFFERENTIAL (CANCER CENTER ONLY)
Abs Immature Granulocytes: 0.12 10*3/uL — ABNORMAL HIGH (ref 0.00–0.07)
Basophils Absolute: 0 10*3/uL (ref 0.0–0.1)
Basophils Relative: 0 %
Eosinophils Absolute: 0.1 10*3/uL (ref 0.0–0.5)
Eosinophils Relative: 2 %
HCT: 35.8 % — ABNORMAL LOW (ref 36.0–46.0)
Hemoglobin: 11.9 g/dL — ABNORMAL LOW (ref 12.0–15.0)
Immature Granulocytes: 1 %
Lymphocytes Relative: 19 %
Lymphs Abs: 1.7 10*3/uL (ref 0.7–4.0)
MCH: 29.4 pg (ref 26.0–34.0)
MCHC: 33.2 g/dL (ref 30.0–36.0)
MCV: 88.4 fL (ref 80.0–100.0)
Monocytes Absolute: 0.6 10*3/uL (ref 0.1–1.0)
Monocytes Relative: 6 %
Neutro Abs: 6.4 10*3/uL (ref 1.7–7.7)
Neutrophils Relative %: 72 %
Platelet Count: 346 10*3/uL (ref 150–400)
RBC: 4.05 MIL/uL (ref 3.87–5.11)
RDW: 13 % (ref 11.5–15.5)
WBC Count: 8.9 10*3/uL (ref 4.0–10.5)
nRBC: 0 % (ref 0.0–0.2)

## 2021-07-02 LAB — CMP (CANCER CENTER ONLY)
ALT: 15 U/L (ref 0–44)
AST: 13 U/L — ABNORMAL LOW (ref 15–41)
Albumin: 4 g/dL (ref 3.5–5.0)
Alkaline Phosphatase: 72 U/L (ref 38–126)
Anion gap: 5 (ref 5–15)
BUN: 10 mg/dL (ref 6–20)
CO2: 29 mmol/L (ref 22–32)
Calcium: 9 mg/dL (ref 8.9–10.3)
Chloride: 106 mmol/L (ref 98–111)
Creatinine: 0.78 mg/dL (ref 0.44–1.00)
GFR, Estimated: >60 mL/min (ref >60)
Glucose, Bld: 112 mg/dL — ABNORMAL HIGH (ref 70–99)
Potassium: 4.5 mmol/L (ref 3.5–5.1)
Sodium: 140 mmol/L (ref 135–145)
Total Bilirubin: 0.3 mg/dL (ref 0.3–1.2)
Total Protein: 6.8 g/dL (ref 6.5–8.1)

## 2021-07-02 MED ORDER — DIPHENHYDRAMINE HCL 25 MG PO CAPS
25.0000 mg | ORAL_CAPSULE | Freq: Once | ORAL | Status: AC
  Administered 2021-07-02: 25 mg via ORAL
  Filled 2021-07-02: qty 1

## 2021-07-02 MED ORDER — SODIUM CHLORIDE 0.9% FLUSH
10.0000 mL | Freq: Once | INTRAVENOUS | Status: AC
  Administered 2021-07-02: 10 mL

## 2021-07-02 MED ORDER — SODIUM CHLORIDE 0.9 % IV SOLN
420.0000 mg | Freq: Once | INTRAVENOUS | Status: AC
  Administered 2021-07-02: 420 mg via INTRAVENOUS
  Filled 2021-07-02: qty 14

## 2021-07-02 MED ORDER — MAGNESIUM OXIDE -MG SUPPLEMENT 400 (240 MG) MG PO TABS
400.0000 mg | ORAL_TABLET | Freq: Every day | ORAL | 3 refills | Status: DC
  Filled 2021-07-02 – 2021-08-19 (×2): qty 30, 30d supply, fill #0

## 2021-07-02 MED ORDER — TRASTUZUMAB-DKST CHEMO 150 MG IV SOLR
6.0000 mg/kg | Freq: Once | INTRAVENOUS | Status: AC
  Administered 2021-07-02: 378 mg via INTRAVENOUS
  Filled 2021-07-02: qty 18

## 2021-07-02 MED ORDER — LIDOCAINE-PRILOCAINE 2.5-2.5 % EX CREA
TOPICAL_CREAM | CUTANEOUS | 3 refills | Status: DC
  Filled 2021-07-02: qty 30, 1d supply, fill #0
  Filled 2021-08-19: qty 30, 1d supply, fill #1

## 2021-07-02 MED ORDER — SODIUM CHLORIDE 0.9 % IV SOLN: Freq: Once | INTRAVENOUS | Status: AC

## 2021-07-02 MED ORDER — GOSERELIN ACETATE 3.6 MG ~~LOC~~ IMPL
3.6000 mg | DRUG_IMPLANT | Freq: Once | SUBCUTANEOUS | Status: AC
  Administered 2021-07-02: 3.6 mg via SUBCUTANEOUS
  Filled 2021-07-02: qty 3.6

## 2021-07-02 MED ORDER — HEPARIN SOD (PORK) LOCK FLUSH 100 UNIT/ML IV SOLN
500.0000 [IU] | Freq: Once | INTRAVENOUS | Status: AC | PRN
  Administered 2021-07-02: 500 [IU]

## 2021-07-02 MED ORDER — SODIUM CHLORIDE 0.9% FLUSH
10.0000 mL | INTRAVENOUS | Status: DC | PRN
  Administered 2021-07-02: 10 mL

## 2021-07-02 MED ORDER — ACETAMINOPHEN 325 MG PO TABS
650.0000 mg | ORAL_TABLET | Freq: Once | ORAL | Status: AC
  Administered 2021-07-02: 650 mg via ORAL
  Filled 2021-07-02: qty 2

## 2021-07-02 MED ORDER — LORAZEPAM 0.5 MG PO TABS
0.5000 mg | ORAL_TABLET | Freq: Every evening | ORAL | 3 refills | Status: DC | PRN
  Filled 2021-07-02: qty 30, 30d supply, fill #0
  Filled 2021-08-19: qty 30, 30d supply, fill #1

## 2021-07-02 NOTE — Progress Notes (Signed)
Pt refused 30 minute post obs from treatment.  VSS.  No complaints.   ?

## 2021-07-02 NOTE — Assessment & Plan Note (Signed)
08/15/2020: CT CAP: Breast mass 3.5 cm, left axillary lymph node, multiple bone metastases, extensive liver metastases, small lung nodules ?? 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 ?Palliative radiation completed 09/03/2020 ?Taxotere/Herceptin/Perjeta 08/31/2020-12/18/2020 ?Herceptin Perjeta Maintenance ?beginning 01/08/2021 ?? ?Treatment plan: ?Herceptin Perjeta maintenance  ?------------------------------------------------------------------------------------------------------------------------------------ ?Current treatment: ?Herceptin Perjeta, Zoladex, and anastrozole ?Toxicities: ?Depression/ADHD:?She is establishing with primary care for management of this. ?Weight loss:?Her weight is stable ?Diarrhea: Resolved ?? ?CT CAP 03-21-2021: Left-sided breast mass appears similar, widespread metastatic disease to the bones appear similar, no new bone lesions, 4 mm nodule right middle lobe stable, no suspicious abnormalities in the liver ?? ?Based on excellent response to treatment we will continue with the current plan. ?We will obtain a mammogram and ultrasound of the left breast to evaluate the breast a little bit further because she is having some pain and discomfort in the breast. ?? ?Return to clinic every 4 weeks for Herceptin Perjeta and Zoladex and every 8 weeks to follow-up with [place]. ?

## 2021-07-02 NOTE — Patient Instructions (Signed)
[facility]  Discharge Instructions: ?Thank you for choosing [facility] to provide your oncology and hematology care.  ? ?If you have a lab appointment with the [facility], please go directly to the [facility] and check in at the registration area. ?  ?Wear comfortable clothing and clothing appropriate for easy access to any Portacath or PICC line.  ? ?We strive to give you quality time with your provider. You may need to reschedule your appointment if you arrive late (15 or more minutes).  Arriving late affects you and other patients whose appointments are after yours.  Also, if you miss three or more appointments without notifying the office, you may be dismissed from the clinic at the provider?s discretion.    ?  ?For prescription refill requests, have your pharmacy contact our office and allow 72 hours for refills to be completed.   ? ?Today you received the following chemotherapy and/or immunotherapy agents: Trastuzumab, Pertuzumab, Zoladex  ?  ?To help prevent nausea and vomiting after your treatment, we encourage you to take your nausea medication as directed. ? ?BELOW ARE SYMPTOMS THAT SHOULD BE REPORTED IMMEDIATELY: ?*FEVER GREATER THAN 100.4 F (38 ?C) OR HIGHER ?*CHILLS OR SWEATING ?*NAUSEA AND VOMITING THAT IS NOT CONTROLLED WITH YOUR NAUSEA MEDICATION ?*UNUSUAL SHORTNESS OF BREATH ?*UNUSUAL BRUISING OR BLEEDING ?*URINARY PROBLEMS (pain or burning when urinating, or frequent urination) ?*BOWEL PROBLEMS (unusual diarrhea, constipation, pain near the anus) ?TENDERNESS IN MOUTH AND THROAT WITH OR WITHOUT PRESENCE OF ULCERS (sore throat, sores in mouth, or a toothache) ?UNUSUAL RASH, SWELLING OR PAIN  ?UNUSUAL VAGINAL DISCHARGE OR ITCHING  ? ?Items with * indicate a potential emergency and should be followed up as soon as possible or go to the Emergency Department if any problems should occur. ? ?Please show the CHEMOTHERAPY ALERT CARD or IMMUNOTHERAPY ALERT  CARD at check-in to the Emergency Department and triage nurse. ? ?Should you have questions after your visit or need to cancel or reschedule your appointment, please contact [facility]  Dept: [phone]  and follow the prompts.  Office hours are 8:00 a.m. to 4:30 p.m. Monday - Friday. Please note that voicemails left after 4:00 p.m. may not be returned until the following business day.  We are closed weekends and major holidays. You have access to a nurse at all times for urgent questions. Please call the main number to the clinic Dept: [phone] and follow the prompts. ? ? ?For any non-urgent questions, you may also contact your provider using MyChart. We now offer e-Visits for anyone 105 and older to request care online for non-urgent symptoms. For details visit [email or web address]. ?  ?Also download the MyChart app! Go to the app store, search "MyChart", open the app, select [facility], and log in with your MyChart username and password. ? ?Due to Covid, a mask is required upon entering the hospital/clinic. If you do not have a mask, one will be given to you upon arrival. For doctor visits, patients may have 1 support person aged 74 or older with them. For treatment visits, patients cannot have anyone with them due to current Covid guidelines and our immunocompromised population.  ? ?Pertuzumab injection ?What is this medication? ?PERTUZUMAB (per TOOZ ue mab) is a monoclonal antibody. It is used to treat breast cancer. ?This medicine may be used for other purposes; ask your health care provider or pharmacist if you have questions. ?COMMON BRAND NAME(S): PERJETA ?What should I tell  my care team before I take this medication? ?They need to know if you have any of these conditions: ?heart disease ?heart failure ?high blood pressure ?history of irregular heart beat ?recent or ongoing radiation therapy ?an unusual or allergic reaction to pertuzumab, other medicines, foods, dyes,  or preservatives ?pregnant or trying to get pregnant ?breast-feeding ?How should I use this medication? ?This medicine is for infusion into a vein. It is given by a health care professional in a hospital or clinic setting. ?Talk to your pediatrician regarding the use of this medicine in children. Special care may be needed. ?Overdosage: If you think you have taken too much of this medicine contact a poison control center or emergency room at once. ?NOTE: This medicine is only for you. Do not share this medicine with others. ?What if I miss a dose? ?It is important not to miss your dose. Call your doctor or health care professional if you are unable to keep an appointment. ?What may interact with this medication? ?Interactions are not expected. ?Give your health care provider a list of all the medicines, herbs, non-prescription drugs, or dietary supplements you use. Also tell them if you smoke, drink alcohol, or use illegal drugs. Some items may interact with your medicine. ?This list may not describe all possible interactions. Give your health care provider a list of all the medicines, herbs, non-prescription drugs, or dietary supplements you use. Also tell them if you smoke, drink alcohol, or use illegal drugs. Some items may interact with your medicine. ?What should I watch for while using this medication? ?Your condition will be monitored carefully while you are receiving this medicine. Report any side effects. Continue your course of treatment even though you feel ill unless your doctor tells you to stop. ?Do not become pregnant while taking this medicine or for 7 months after stopping it. Women should inform their doctor if they wish to become pregnant or think they might be pregnant. Women of child-bearing potential will need to have a negative pregnancy test before starting this medicine. There is a potential for serious side effects to an unborn child. Talk to your health care professional or pharmacist for  more information. Do not breast-feed an infant while taking this medicine or for 7 months after stopping it. ?Women must use effective birth control with this medicine. ?Call your doctor or health care professional for advice if you get a fever, chills or sore throat, or other symptoms of a cold or flu. Do not treat yourself. Try to avoid being around people who are sick. ?You may experience fever, chills, and headache during the infusion. Report any side effects during the infusion to your health care professional. ?What side effects may I notice from receiving this medication? ?Side effects that you should report to your doctor or health care professional as soon as possible: ?breathing problems ?chest pain or palpitations ?dizziness ?feeling faint or lightheaded ?fever or chills ?skin rash, itching or hives ?sore throat ?swelling of the face, lips, or tongue ?swelling of the legs or ankles ?unusually weak or tired ?Side effects that usually do not require medical attention (report to your doctor or health care professional if they continue or are bothersome): ?diarrhea ?hair loss ?nausea, vomiting ?tiredness ?This list may not describe all possible side effects. Call your doctor for medical advice about side effects. You may report side effects to FDA at 1-800-FDA-1088. ?Where should I keep my medication? ?This drug is given in a hospital or clinic and will not  be stored at home. ?NOTE: This sheet is a summary. It may not cover all possible information. If you have questions about this medicine, talk to your doctor, pharmacist, or health care provider. ?? 2023 Elsevier/Gold Standard (2015-03-08 00:00:00) ? ?Trastuzumab injection for infusion ?What is this medication? ?TRASTUZUMAB (tras TOO zoo mab) is a monoclonal antibody. It is used to treat breast cancer and stomach cancer. ?This medicine may be used for other purposes; ask your health care provider or pharmacist if you have questions. ?COMMON BRAND NAME(S):  Herceptin, Belenda Cruise, Ogivri, Ontruzant, Trazimera ?What should I tell my care team before I take this medication? ?They need to know if you have any of these conditions: ?heart disease ?hear

## 2021-07-03 ENCOUNTER — Ambulatory Visit: Payer: Medicaid Other | Admitting: Hematology and Oncology

## 2021-07-03 ENCOUNTER — Ambulatory Visit: Payer: Medicaid Other

## 2021-07-03 ENCOUNTER — Other Ambulatory Visit: Payer: Medicaid Other

## 2021-07-03 ENCOUNTER — Telehealth: Payer: Self-pay | Admitting: Hematology and Oncology

## 2021-07-03 NOTE — Telephone Encounter (Signed)
Scheduled appointment per 5/16 los. Patient is aware. ?

## 2021-07-04 ENCOUNTER — Telehealth: Payer: Self-pay | Admitting: *Deleted

## 2021-07-04 NOTE — Patient Instructions (Addendum)
DUE TO COVID-19 ONLY TWO VISITORS  (aged 27 and older)  IS ALLOWED TO COME WITH YOU AND STAY IN THE WAITING ROOM ONLY DURING PRE OP AND PROCEDURE.   **NO VISITORS ARE ALLOWED IN THE SHORT STAY AREA OR RECOVERY ROOM!!**  IF YOU WILL BE ADMITTED INTO THE HOSPITAL YOU ARE ALLOWED ONLY FOUR SUPPORT PEOPLE DURING VISITATION HOURS ONLY (7 AM -8PM)   The support person(s) must pass our screening, gel in and out Visitors GUEST BADGE MUST BE WORN VISIBLY  One adult visitor may remain with you overnight and MUST be in the room by 8 P.M.   You are not required to [employer] often Do NOT share personal items Notify your provider if you are in close contact with someone who has COVID or you develop fever 100.4 or greater, new onset of sneezing, cough, sore throat, shortness of breath or body aches.        Your procedure is scheduled on:  07-16-21   Report to [facility] Main Entrance    Report to admitting at 8:15 AM   Call this number if you have problems the morning of surgery [phone]   Follow a light diet day before surgery (avoid gas producing foods)   Do not eat food :After Midnight.   After Midnight you may have the following liquids until 7:30 AM DAY OF SURGERY  Water Black Coffee (sugar ok, NO MILK/CREAM OR CREAMERS)  Tea (sugar ok, NO MILK/CREAM OR CREAMERS) regular and decaf                             Plain Jell-O (NO RED)                                           Fruit ices (not with fruit pulp, NO RED)                                     Popsicles (NO RED)                                                                  Juice: apple, WHITE grape, WHITE cranberry Sports drinks like Gatorade (NO RED) Clear broth(vegetable,chicken,beef)                   The day of surgery:  Drink ONE (1) Pre-Surgery Clear Ensure at 7:30 AM the morning of surgery. Drink in one sitting. Do not sip.  This drink was given to you during your hospital  pre-op appointment  visit. Nothing else to drink after completing the Pre-Surgery Clear Ensure           If you have questions, please contact your surgeon's office.   FOLLOW  ANY ADDITIONAL PRE OP INSTRUCTIONS YOU RECEIVED FROM YOUR SURGEON'S OFFICE!!!     Oral Hygiene is also important to reduce your risk of infection.  Remember - BRUSH YOUR TEETH THE MORNING OF SURGERY WITH YOUR REGULAR TOOTHPASTE   Do NOT smoke after Midnight   Take these medicines the morning of surgery with A SIP OF WATER: Effexor.  Nausea medicine and Oxycodone if needed                              You may not have any metal on your body including hair pins, jewelry, and body piercing             Do not wear make-up, lotions, powders, perfumes or deodorant  Do not wear nail polish including gel and S&S, artificial/acrylic nails, or any other type of covering on natural nails including finger and toenails. If you have artificial nails, gel coating, etc. that needs to be removed by a nail salon please have this removed prior to surgery or surgery may need to be canceled/ delayed if the surgeon/ anesthesia feels like they are unable to be safely monitored.   Do not shave  48 hours prior to surgery.    Do not bring valuables to the hospital. [place].   Contacts, dentures or bridgework may not be worn into surgery.   Patients discharged on the day of surgery will not be allowed to drive home.  Someone NEEDS to stay with you for the first 24 hours after anesthesia.  Please read over the following fact sheets you were given: IF YOU HAVE QUESTIONS ABOUT YOUR PRE-OP INSTRUCTIONS PLEASE CALL [facility] - Preparing for Surgery Before surgery, you can play an important role.  Because skin is not sterile, your skin needs to be as free of germs as possible.  You can reduce the number of germs on your skin by washing with CHG (chlorahexidine  gluconate) soap before surgery.  CHG is an antiseptic cleaner which kills germs and bonds with the skin to continue killing germs even after washing. Please DO NOT use if you have an allergy to CHG or antibacterial soaps.  If your skin becomes reddened/irritated stop using the CHG and inform your nurse when you arrive at Short Stay. Do not shave (including legs and underarms) for at least 48 hours prior to the first CHG shower.  You may shave your face/neck.  Please follow these instructions carefully:  1.  Shower with CHG Soap the night before surgery and the  morning of surgery.  2.  If you choose to wash your hair, wash your hair first as usual with your normal  shampoo.  3.  After you shampoo, rinse your hair and body thoroughly to remove the shampoo.                             4.  Use CHG as you would any other liquid soap.  You can apply chg directly to the skin and wash.  Gently with a scrungie or clean washcloth.  5.  Apply the CHG Soap to your body ONLY FROM THE NECK DOWN.   Do   not use on face/ open                           Wound or open sores. Avoid contact with eyes, ears mouth and   genitals (private parts).  Wash face,  Genitals (private parts) with your normal soap.             6.  Wash thoroughly, paying special attention to the area where your    surgery  will be performed.  7.  Thoroughly rinse your body with warm water from the neck down.  8.  DO NOT shower/wash with your normal soap after using and rinsing off the CHG Soap.                9.  Pat yourself dry with a clean towel.            10.  Wear clean pajamas.            11.  Place clean sheets on your bed the night of your first shower and do not  sleep with pets. Day of Surgery : Do not apply any lotions/deodorants the morning of surgery.  Please wear clean clothes to the hospital/surgery center.  FAILURE TO FOLLOW THESE INSTRUCTIONS MAY RESULT IN THE CANCELLATION OF YOUR SURGERY  PATIENT  SIGNATURE_________________________________  NURSE SIGNATURE__________________________________  ________________________________________________________________________    WHAT IS A BLOOD TRANSFUSION? Blood Transfusion Information  A transfusion is the replacement of blood or some of its parts. Blood is made up of multiple cells which provide different functions. Red blood cells carry oxygen and are used for blood loss replacement. White blood cells fight against infection. Platelets control bleeding. Plasma helps clot blood. Other blood products are available for specialized needs, such as hemophilia or other clotting disorders. BEFORE THE TRANSFUSION  Who gives blood for transfusions?  Healthy volunteers who are fully evaluated to make sure their blood is safe. This is blood bank blood. Transfusion therapy is the safest it has ever been in the practice of medicine. Before blood is taken from a donor, a complete history is taken to make sure that person has no history of diseases nor engages in risky social behavior (examples are intravenous drug use or sexual activity with multiple partners). The donor's travel history is screened to minimize risk of transmitting infections, such as malaria. The donated blood is tested for signs of infectious diseases, such as HIV and hepatitis. The blood is then tested to be sure it is compatible with you in order to minimize the chance of a transfusion reaction. If you or a relative donates blood, this is often done in anticipation of surgery and is not appropriate for emergency situations. It takes many days to process the donated blood. RISKS AND COMPLICATIONS Although transfusion therapy is very safe and saves many lives, the main dangers of transfusion include:  Getting an infectious disease. Developing a transfusion reaction. This is an allergic reaction to something in the blood you were given. Every precaution is taken to prevent this. The decision to  have a blood transfusion has been considered carefully by your caregiver before blood is given. Blood is not given unless the benefits outweigh the risks. AFTER THE TRANSFUSION Right after receiving a blood transfusion, you will usually feel much better and more energetic. This is especially true if your red blood cells have gotten low (anemic). The transfusion raises the level of the red blood cells which carry oxygen, and this usually causes an energy increase. The nurse administering the transfusion will monitor you carefully for complications. HOME CARE INSTRUCTIONS  No special instructions are needed after a transfusion. You may find your energy is better. Speak with your caregiver about any limitations on activity for underlying  diseases you may have. SEEK MEDICAL CARE IF:  Your condition is not improving after your transfusion. You develop redness or irritation at the intravenous (IV) site. SEEK IMMEDIATE MEDICAL CARE IF:  Any of the following symptoms occur over the next 12 hours: Shaking chills. You have a temperature by mouth above 102 F (38.9 C), not controlled by medicine. Chest, back, or muscle pain. People around you feel you are not acting correctly or are confused. Shortness of breath or difficulty breathing. Dizziness and fainting. You get a rash or develop hives. You have a decrease in urine output. Your urine turns a dark color or changes to pink, red, or brown. Any of the following symptoms occur over the next 10 days: You have a temperature by mouth above 102 F (38.9 C), not controlled by medicine. Shortness of breath. Weakness after normal activity. The white part of the eye turns yellow (jaundice). You have a decrease in the amount of urine or are urinating less often. Your urine turns a dark color or changes to pink, red, or brown. Document Released: 02/01/2000 Document Revised: 04/28/2011 Document Reviewed: 09/20/2007 [facility] Patient Information 2014  [place], [place].  _______________________________________________________________________

## 2021-07-04 NOTE — Progress Notes (Addendum)
COVID Vaccine Completed:  Yes x3  Date of COVID positive in last 90 days:  No  PCP - No PCP Cardiologist - N/A  Chest x-ray - 08-23-20 Epic EKG - 09-10-20 Epic Stress Test - N/A ECHO - 06-26-21 Epic Cardiac Cath - N/A Pacemaker/ICD device last checked: Spinal Cord Stimulator:  Bowel Prep - Light diet day before surgery (avoid gas producing foods)  Sleep Study - N/A CPAP -   Fasting Blood Sugar - N/A Checks Blood Sugar _____ times a day  Blood Thinner Instructions:  N/A Aspirin Instructions: Last Dose:  Activity level:   Can go up a flight of stairs and perform activities of daily living without stopping and without symptoms of chest pain or shortness of breath.    Anesthesia review:  Active chemo, last treatment 07-02-21  Patient denies shortness of breath, fever, cough and chest pain at PAT appointment   Patient verbalized understanding of instructions that were given to them at the PAT appointment. Patient was also instructed that they will need to review over the PAT instructions again at home before surgery.

## 2021-07-04 NOTE — Telephone Encounter (Signed)
Per [name] called and spoke with the patient and explained that "Ellen [name] will need to be the OR Tuesday, but Collie Siad, RN will see her for her pre-op appt and discuss everything. Also that Ellen [name] will call on Thursday to touch base." Patient verbalized understanding

## 2021-07-05 ENCOUNTER — Ambulatory Visit (HOSPITAL_COMMUNITY)
Admission: RE | Admit: 2021-07-05 | Discharge: 2021-07-05 | Disposition: A | Discharge status: Home / Self Care | Payer: Medicaid Other | Source: Ambulatory Visit | Attending: Hematology and Oncology | Admitting: Hematology and Oncology

## 2021-07-05 DIAGNOSIS — C50912 Malignant neoplasm of unspecified site of left female breast: Secondary | ICD-10-CM | POA: Diagnosis present

## 2021-07-05 DIAGNOSIS — C50919 Malignant neoplasm of unspecified site of unspecified female breast: Secondary | ICD-10-CM | POA: Diagnosis not present

## 2021-07-05 DIAGNOSIS — C7951 Secondary malignant neoplasm of bone: Secondary | ICD-10-CM | POA: Diagnosis present

## 2021-07-05 LAB — GLUCOSE, CAPILLARY: Glucose-Capillary: 101 mg/dL — ABNORMAL HIGH (ref 70–99)

## 2021-07-05 MED ORDER — FLUDEOXYGLUCOSE F - 18 (FDG) INJECTION
7.1000 | Freq: Once | INTRAVENOUS | Status: AC
  Administered 2021-07-05: 7.1 via INTRAVENOUS

## 2021-07-08 MED ORDER — IBUPROFEN 800 MG PO TABS: 800.0000 mg | ORAL_TABLET | Freq: Three times a day (TID) | ORAL | 0 refills | Status: DC | PRN

## 2021-07-08 MED ORDER — SENNOSIDES-DOCUSATE SODIUM 8.6-50 MG PO TABS: 2.0000 | ORAL_TABLET | Freq: Every day | ORAL | 0 refills | Status: DC

## 2021-07-08 MED ORDER — OXYCODONE HCL 5 MG PO TABS: 5.0000 mg | ORAL_TABLET | ORAL | 0 refills | Status: DC | PRN

## 2021-07-08 NOTE — Patient Instructions (Addendum)
Preparing for your Surgery  Plan for surgery on Jul 16, 2021 with Dr. Jeral Pinch at [place] will be scheduled for robotic assisted laparoscopic bilateral salpingo-oophorectomy (removal of both ovaries and fallopian tubes), and any other indicated procedures.   Pre-operative Testing -(DONE) You will receive a phone call from presurgical testing at [facility]  to arrange for a pre-operative appointment and lab work.  -Bring your insurance card, copy of an advanced directive if applicable, medication list  -At that visit, you will be asked to sign a consent for a possible blood transfusion in case a transfusion becomes necessary during surgery.  The need for a blood transfusion is rare but having consent is a necessary part of your care.     -You should not be taking blood thinners or aspirin at least ten days prior to surgery unless instructed by your surgeon.  -Do not take supplements such as fish oil (omega 3), red yeast rice, turmeric before your surgery. You want to avoid medications with aspirin in them including headache powders such as BC or Goody's), Excedrin migraine.  Day Before Surgery at [place] will be asked to take in a light diet the day before surgery. You will be advised you can have clear liquids up until 3 hours before your surgery.    Eat a light diet the day before surgery.  Examples including soups, broths, toast, yogurt, mashed potatoes.  AVOID GAS PRODUCING FOODS. Things to avoid include carbonated beverages (fizzy beverages, sodas), raw fruits and raw vegetables (uncooked), or beans.   If your bowels are filled with gas, your surgeon will have difficulty visualizing your pelvic organs which increases your surgical risks.  Your role in recovery Your role is to become active as soon as directed by your doctor, while still giving yourself time to heal.  Rest when you feel tired. You will be asked to do the following in order to speed your  recovery:  - Cough and breathe deeply. This helps to clear and expand your lungs and can prevent pneumonia after surgery.  - [place]. Do mild physical activity. Walking or moving your legs help your circulation and body functions return to normal. Do not try to get up or walk alone the first time after surgery.   -If you develop swelling on one leg or the other, pain in the back of your leg, redness/warmth in one of your legs, please call the office or go to the Emergency Room to have a doppler to rule out a blood clot. For shortness of breath, chest pain-seek care in the Emergency Room as soon as possible. - Actively manage your pain. Managing your pain lets you move in comfort. We will ask you to rate your pain on a scale of zero to 10. It is your responsibility to tell your doctor or nurse where and how much you hurt so your pain can be treated.  Special Considerations -If you are diabetic, you may be placed on insulin after surgery to have closer control over your blood sugars to promote healing and recovery.  This does not mean that you will be discharged on insulin.  If applicable, your oral antidiabetics will be resumed when you are tolerating a solid diet.  -Your final pathology results from surgery should be available around one week after surgery and the results will be relayed to you when available.  -FMLA forms can be faxed to [phone] and please allow 5-7  business days for completion.  Pain Management After Surgery -You will be prescribed your pain medication and bowel regimen medications before surgery so that you can have these available when you are discharged from the hospital. The pain medication is for use ONLY AFTER surgery and a new prescription will not be given.   -Make sure that you have Tylenol and Ibuprofen at home to use on a regular basis after surgery for pain control. We recommend alternating the medications every hour to six hours since they  work differently and are processed in the body differently for pain relief.  -Review the attached handout on narcotic use and their risks and side effects.   Bowel Regimen -You will be prescribed Sennakot-S to take nightly to prevent constipation especially if you are taking the narcotic pain medication intermittently.  It is important to prevent constipation and drink adequate amounts of liquids. You can stop taking this medication when you are not taking pain medication and you are back on your normal bowel routine.  Risks of Surgery Risks of surgery are low but include bleeding, infection, damage to surrounding structures, re-operation, blood clots, and very rarely death.   Blood Transfusion Information (For the consent to be signed before surgery)  We will be checking your blood type before surgery so in case of emergencies, we will know what type of blood you would need.                                            WHAT IS A BLOOD TRANSFUSION?  A transfusion is the replacement of blood or some of its parts. Blood is made up of multiple cells which provide different functions. Red blood cells carry oxygen and are used for blood loss replacement. White blood cells fight against infection. Platelets control bleeding. Plasma helps clot blood. Other blood products are available for specialized needs, such as hemophilia or other clotting disorders. BEFORE THE TRANSFUSION  Who gives blood for transfusions?  You may be able to donate blood to be used at a later date on yourself (autologous donation). Relatives can be asked to donate blood. This is generally not any safer than if you have received blood from a stranger. The same precautions are taken to ensure safety when a relative's blood is donated. Healthy volunteers who are fully evaluated to make sure their blood is safe. This is blood bank blood. Transfusion therapy is the safest it has ever been in the practice of medicine. Before blood  is taken from a donor, a complete history is taken to make sure that person has no history of diseases nor engages in risky social behavior (examples are intravenous drug use or sexual activity with multiple partners). The donor's travel history is screened to minimize risk of transmitting infections, such as malaria. The donated blood is tested for signs of infectious diseases, such as HIV and hepatitis. The blood is then tested to be sure it is compatible with you in order to minimize the chance of a transfusion reaction. If you or a relative donates blood, this is often done in anticipation of surgery and is not appropriate for emergency situations. It takes many days to process the donated blood. RISKS AND COMPLICATIONS Although transfusion therapy is very safe and saves many lives, the main dangers of transfusion include:  Getting an infectious disease. Developing a transfusion reaction. This is an  allergic reaction to something in the blood you were given. Every precaution is taken to prevent this. The decision to have a blood transfusion has been considered carefully by your caregiver before blood is given. Blood is not given unless the benefits outweigh the risks.  AFTER SURGERY INSTRUCTIONS  Return to work: 4-6 weeks if applicable  Activity: 1. Be up and out of the bed during the day.  Take a nap if needed.  You may walk up steps but be careful and use the hand rail.  Stair climbing will tire you more than you think, you may need to stop part way and rest.   2. No lifting or straining for 6 weeks over 10 pounds. No pushing, pulling, straining for 6 weeks.  3. No driving for around 1 week(s).  Do not drive if you are taking narcotic pain medicine and make sure that your reaction time has returned.   4. You can shower as soon as the next day after surgery. Shower daily.  Use your regular soap and water (not directly on the incision) and pat your incision(s) dry afterwards; don't rub.  No tub  baths or submerging your body in water until cleared by your surgeon. If you have the soap that was given to you by pre-surgical testing that was used before surgery, you do not need to use it afterwards because this can irritate your incisions.   5. No sexual activity and nothing in the vagina for 4 weeks.  6. You may experience a small amount of clear drainage from your incisions, which is normal.  If the drainage persists, increases, or changes color please call the office.  7. Do not use creams, lotions, or ointments such as neosporin on your incisions after surgery until advised by your surgeon because they can cause removal of the dermabond glue on your incisions.    8. You may experience vaginal spotting after surgery.  The spotting is normal but if you experience heavy bleeding, call our office.  9. Take Tylenol or ibuprofen first for pain and only use narcotic pain medication for severe pain not relieved by the Tylenol or Ibuprofen.  Monitor your Tylenol intake to a max of 4,000 mg in a 24 hour period. You can alternate these medications after surgery.  Diet: 1. Low sodium Heart Healthy Diet is recommended but you are cleared to resume your normal (before surgery) diet after your procedure.  2. It is safe to use a laxative, such as Miralax or Colace, if you have difficulty moving your bowels. You have been prescribed Sennakot-S to take at bedtime every evening after surgery to keep bowel movements regular and to prevent constipation.    Wound Care: 1. Keep clean and dry.  Shower daily.  Reasons to call the Doctor: Fever - Oral temperature greater than 100.4 degrees Fahrenheit Foul-smelling vaginal discharge Difficulty urinating Nausea and vomiting Increased pain at the site of the incision that is unrelieved with pain medicine. Difficulty breathing with or without chest pain New calf pain especially if only on one side Sudden, continuing increased vaginal bleeding with or without  clots.   Contacts: For questions or concerns you should contact:  Dr. Jeral Pinch at [phone]  Joylene John, NP at [phone]  After Hours: call [phone] and have the GYN Oncologist paged/contacted (after 5 pm or on the weekends).  Messages sent via mychart are for non-urgent matters and are not responded to after hours so for urgent needs, please call the after hours  number.

## 2021-07-09 ENCOUNTER — Inpatient Hospital Stay: Payer: Medicaid Other

## 2021-07-09 ENCOUNTER — Inpatient Hospital Stay: Payer: Medicaid Other | Attending: Hematology and Oncology | Admitting: Hematology and Oncology

## 2021-07-09 ENCOUNTER — Encounter (HOSPITAL_COMMUNITY): Payer: Self-pay

## 2021-07-09 ENCOUNTER — Telehealth: Payer: Self-pay | Admitting: *Deleted

## 2021-07-09 ENCOUNTER — Encounter (HOSPITAL_COMMUNITY)
Admission: RE | Admit: 2021-07-09 | Discharge: 2021-07-09 | Disposition: A | Discharge status: Home / Self Care | Payer: Medicaid Other | Source: Ambulatory Visit | Attending: Gynecologic Oncology | Admitting: Gynecologic Oncology

## 2021-07-09 ENCOUNTER — Other Ambulatory Visit: Payer: Self-pay

## 2021-07-09 VITALS — BP 108/77 | HR 75 | Temp 98.5°F | Resp 16 | Ht 65.0 in | Wt 141.2 lb

## 2021-07-09 DIAGNOSIS — C50919 Malignant neoplasm of unspecified site of unspecified female breast: Secondary | ICD-10-CM

## 2021-07-09 DIAGNOSIS — C50912 Malignant neoplasm of unspecified site of left female breast: Secondary | ICD-10-CM

## 2021-07-09 DIAGNOSIS — Z01812 Encounter for preprocedural laboratory examination: Secondary | ICD-10-CM | POA: Insufficient documentation

## 2021-07-09 DIAGNOSIS — Z01818 Encounter for other preprocedural examination: Secondary | ICD-10-CM

## 2021-07-09 DIAGNOSIS — C7951 Secondary malignant neoplasm of bone: Secondary | ICD-10-CM | POA: Diagnosis not present

## 2021-07-09 DIAGNOSIS — Z5111 Encounter for antineoplastic chemotherapy: Secondary | ICD-10-CM | POA: Diagnosis not present

## 2021-07-09 DIAGNOSIS — Z95828 Presence of other vascular implants and grafts: Secondary | ICD-10-CM

## 2021-07-09 HISTORY — DX: Attention-deficit hyperactivity disorder, unspecified type: F90.9

## 2021-07-09 HISTORY — DX: Anemia, unspecified: D64.9

## 2021-07-09 HISTORY — DX: Gastro-esophageal reflux disease without esophagitis: K21.9

## 2021-07-09 LAB — BASIC METABOLIC PANEL
Anion gap: 5 (ref 5–15)
BUN: 16 mg/dL (ref 6–20)
CO2: 28 mmol/L (ref 22–32)
Calcium: 9.3 mg/dL (ref 8.9–10.3)
Chloride: 104 mmol/L (ref 98–111)
Creatinine, Ser: 0.7 mg/dL (ref 0.44–1.00)
GFR, Estimated: >60 mL/min (ref >60)
Glucose, Bld: 96 mg/dL (ref 70–99)
Potassium: 4.4 mmol/L (ref 3.5–5.1)
Sodium: 137 mmol/L (ref 135–145)

## 2021-07-09 LAB — CBC
HCT: 39.1 % (ref 36.0–46.0)
Hemoglobin: 13 g/dL (ref 12.0–15.0)
MCH: 30.4 pg (ref 26.0–34.0)
MCHC: 33.2 g/dL (ref 30.0–36.0)
MCV: 91.6 fL (ref 80.0–100.0)
Platelets: 312 10*3/uL (ref 150–400)
RBC: 4.27 MIL/uL (ref 3.87–5.11)
RDW: 13.1 % (ref 11.5–15.5)
WBC: 7.2 10*3/uL (ref 4.0–10.5)
nRBC: 0 % (ref 0.0–0.2)

## 2021-07-09 MED ORDER — HEPARIN SOD (PORK) LOCK FLUSH 100 UNIT/ML IV SOLN
500.0000 [IU] | Freq: Once | INTRAVENOUS | Status: AC
  Administered 2021-07-09: 500 [IU]

## 2021-07-09 MED ORDER — SODIUM CHLORIDE 0.9% FLUSH
10.0000 mL | Freq: Once | INTRAVENOUS | Status: AC
  Administered 2021-07-09: 10 mL

## 2021-07-09 NOTE — Progress Notes (Signed)
HEMATOLOGY-ONCOLOGY TELEPHONE VISIT PROGRESS NOTE  I connected with Ellen [name] on 07/09/21 at  2:45 PM EDT by telephone and verified that I am speaking with the correct person using two identifiers.  I discussed the limitations, risks, security and privacy concerns of performing an evaluation and management service by telephone and the availability of in person appointments.  I also discussed with the patient that there may be a patient responsible charge related to this service. The patient expressed understanding and agreed to proceed.   History of Present Illness: Telephone visit to discuss the results of the PET CT scan.  Oncology History  Primary malignant neoplasm of breast with metastasis ([place])  08/15/2020 Imaging   Large mass in the medial left breast measuring 3.5 cm.  Prominent left axillary lymph node cluster of adjacent lymph nodes, irregular nodule right middle lobe 1.1 cm, aggressive lesion right second rib cortical destruction, multiple lucent lesions throughout the thoracic spine including T1 vertebral body and T2, irregular multilobar lesion central left hepatic lobe 4.3 x 4.7 cm.  Several satellite lesions in the left lateral hepatic lobe.  Multiple sclerotic lesions in the bones iliac wings, acetabular, medial iliac bones, sacrum multiple lesions lumbar spine L3-L4 and L5   08/17/2020 Initial Diagnosis   Biopsy revealed IDC with DCIS grade 2, ER 30%, PR 20%, HER2 equivocal by IHC, FISH positive, Ki-67 25%, lymph node positive    Genetic Testing   Negative genetic testing:  No pathogenic variants detected on the [employer] CancerNext-Expanded + RNAinsight panel. The report date is 09/06/2020.   The CancerNext-Expanded + RNAinsight gene panel offered by [employer] and includes sequencing and rearrangement analysis for the following 77 genes: AIP, ALK, APC, ATM, AXIN2, BAP1, BARD1, BLM, BMPR1A, BRCA1, BRCA2, BRIP1, CDC73, CDH1, CDK4, CDKN1B, CDKN2A, CHEK2, CTNNA1, DICER1, FANCC, FH, FLCN,  GALNT12, KIF1B, LZTR1, MAX, MEN1, MET, MLH1, MSH2, MSH3, MSH6, MUTYH, NBN, NF1, NF2, NTHL1, PALB2, PHOX2B, PMS2, POT1, PRKAR1A, PTCH1, PTEN, RAD51C, RAD51D, RB1, RECQL, RET, SDHA, SDHAF2, SDHB, SDHC, SDHD, SMAD4, SMARCA4, SMARCB1, SMARCE1, STK11, SUFU, TMEM127, TP53, TSC1, TSC2, VHL and XRCC2 (sequencing and deletion/duplication); EGFR, EGLN1, HOXB13, KIT, MITF, PDGFRA, POLD1 and POLE (sequencing only); EPCAM and GREM1 (deletion/duplication only). RNA data is routinely analyzed for use in variant interpretation for all genes.   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 The report date is 09/06/2020.   The CancerNext-Expanded + RNAinsight gene panel offered by [employer] and includes sequencing and rearrangement analysis for the following 77 genes: AIP, ALK, APC, ATM, AXIN2, BAP1, BARD1, BLM, BMPR1A, BRCA1, BRCA2, BRIP1, CDC73, CDH1, CDK4, CDKN1B, CDKN2A, CHEK2, CTNNA1, DICER1, FANCC, FH, FLCN, GALNT12, KIF1B, LZTR1, MAX, MEN1, MET, MLH1, MSH2, MSH3, MSH6, MUTYH, NBN, NF1, NF2, NTHL1, PALB2, PHOX2B, PMS2, POT1, PRKAR1A, PTCH1, PTEN, RAD51C, RAD51D, RB1, RECQL, RET, SDHA, SDHAF2, SDHB, SDHC, SDHD, SMAD4, SMARCA4, SMARCB1, SMARCE1, STK11, SUFU, TMEM127, TP53, TSC1, TSC2, VHL and XRCC2 (sequencing and deletion/duplication); EGFR, EGLN1, HOXB13, KIT, MITF, PDGFRA, POLD1 and POLE (sequencing only); EPCAM and GREM1 (deletion/duplication only). RNA data is routinely analyzed for use in variant interpretation for all genes.   08/18/2020 - 08/30/2020 Radiation Therapy   Palliative radiation to the lumbar and pelvic bone; 30 Gy in 10 fractions   08/31/2020 -  Adjuvant Chemotherapy  Started with Herceptin/Perjeta on 08/31/2020 and Taxotere on  09/04/2020; she was hospitalized with dehydration and refractory diarrhea for 11 days, she resumed treatment with Taxotere (dose reduced by 84m/m2) and Herceptin only (perjeta discontinued) on 09/26/2020, reintroduced 10/17/2020   09/03/2020 Cancer Staging   Staging form: Breast, AJCC 8th Edition - Clinical stage from 09/03/2020: Stage IV (cT4b, cN1, cM1, G2, ER+, PR+, HER2+) - Signed by CGardenia Phlegm NP on 10/03/2020 Stage prefix: Initial diagnosis Method of lymph node assessment: Clinical Histologic grading system: 3 grade system      REVIEW OF SYSTEMS:   Constitutional: Denies fevers, chills or abnormal weight loss   All other systems were reviewed with the patient and are negative. Observations/Objective:     Assessment Plan:  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, FISH positive ratio 2.8, Ki-67 25%, lymph node positive   Palliative radiation completed 09/03/2020 Taxotere/Herceptin/Perjeta 08/31/2020-12/18/2020 Herceptin Perjeta Maintenance  beginning 01/08/2021   Treatment plan:  Herceptin Perjeta maintenance  ------------------------------------------------------------------------------------------------------------------------------------ Current treatment:  Herceptin Perjeta, Zoladex, and anastrozole Toxicities: Depression/ADHD: She is establishing with primary care for management of this. Weight loss: Her weight is stable Diarrhea: Resolved  PET/CT 07/07/2021: Interval decrease in the left breast mass having an SUV of 2.7, small nodule left breast SUV 5.6, left axillary lymph node SUV 3.35, liver metastases no longer visible.  Sclerosis involving bone metastases.  New compression fracture L3 vertebral spine no sclerotic  Based on excellent  results of the PET CT scan I reached out to Dr. TGeorgette Doverto see if there is any role for lumpectomy to remove the tumor in the breast.  Patient feels that the breast tumor is bothering her and would like to see if it could be operated on.    I discussed the assessment and treatment plan with the patient. The patient was provided an opportunity to ask questions and all were answered. The patient agreed with the plan and demonstrated an understanding of the instructions. The patient was advised to call back or seek an in-person evaluation if the symptoms worsen or if the condition fails to improve as anticipated.   I provided 12 minutes of non-face-to-face time during this encounter. VHarriette Ohara MD

## 2021-07-09 NOTE — Addendum Note (Signed)
Addended by: Charmian Muff on: 07/09/2021 11:51 AM   Modules accepted: Orders

## 2021-07-09 NOTE — Assessment & Plan Note (Signed)
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, ER 30%, PR 20%, HER2 equivocal by IHC, FISH positiveratio 2.8, Ki-67 25%, lymph node positive  Palliative radiation completed 09/03/2020 Taxotere/Herceptin/Perjeta 08/31/2020-12/18/2020 Herceptin Perjeta Maintenance beginning 01/08/2021  Treatment plan: Herceptin Perjeta maintenance  ------------------------------------------------------------------------------------------------------------------------------------ Current treatment: Herceptin Perjeta, Zoladex, and anastrozole Toxicities: Depression/ADHD:She is establishing with primary care for management of this. Weight loss:Her weight is stable Diarrhea: Resolved  PET/CT 07/07/2021: Interval decrease in the left breast mass having an SUV of 2.7, small nodule left breast SUV 5.6, left axillary lymph node SUV 3.35, liver metastases no longer visible.  Sclerosis involving bone metastases.  New compression fracture L3 vertebral spine no sclerotic  Based on excellent results of the PET CT scan I do not recommend switching her treatment to anything else.

## 2021-07-09 NOTE — Telephone Encounter (Signed)
Performed pre op education with pt. Reviewed all education material per Joylene John, NP education materials. Pt verbalized understanding of education and did not voice any concerns or questions.

## 2021-07-10 ENCOUNTER — Other Ambulatory Visit: Payer: Self-pay | Admitting: *Deleted

## 2021-07-10 DIAGNOSIS — C50912 Malignant neoplasm of unspecified site of left female breast: Secondary | ICD-10-CM

## 2021-07-11 ENCOUNTER — Inpatient Hospital Stay (HOSPITAL_BASED_OUTPATIENT_CLINIC_OR_DEPARTMENT_OTHER): Payer: Medicaid Other | Admitting: Gynecologic Oncology

## 2021-07-11 DIAGNOSIS — C50919 Malignant neoplasm of unspecified site of unspecified female breast: Secondary | ICD-10-CM

## 2021-07-12 ENCOUNTER — Telehealth: Payer: Self-pay

## 2021-07-12 ENCOUNTER — Other Ambulatory Visit: Payer: Self-pay | Admitting: Hematology and Oncology

## 2021-07-12 ENCOUNTER — Ambulatory Visit
Admission: RE | Admit: 2021-07-12 | Discharge: 2021-07-12 | Disposition: A | Discharge status: Home / Self Care | Payer: Medicaid Other | Source: Ambulatory Visit | Attending: Hematology and Oncology | Admitting: Hematology and Oncology

## 2021-07-12 ENCOUNTER — Encounter: Payer: Self-pay | Admitting: Hematology and Oncology

## 2021-07-12 DIAGNOSIS — C50912 Malignant neoplasm of unspecified site of left female breast: Secondary | ICD-10-CM

## 2021-07-12 NOTE — Telephone Encounter (Signed)
Telephone call to check on pre-operative status.  Patient compliant with pre-operative instructions.  Reinforced nothing to eat after midnight. Clear liquids until 0730am on 07/16/21. Patient to arrive at [facility] on 07/16/21.  Advised patient that per pre-op an IV will be started for the surgery, they typically do not access ports for surgery unless necessary. Patient verbalized understanding. No questions or concerns voiced.  Instructed to call for any needs.

## 2021-07-12 NOTE — Progress Notes (Signed)
GYN Oncology Pre-operative Follow Up Telephone Call  Pre-operative instructions given to the patient in the office on 07/09/2021 by [facility] RN. Called patient today to check in and re-review the pre and post-op instructions and answer any questions. The patient is doing well. Reviewed her preop lab work. Instructions discussed and all questions answered. She is advised to call for any needs or concerns.   No charge for phone visit since included in the surgical global fee.

## 2021-07-16 ENCOUNTER — Ambulatory Visit (HOSPITAL_BASED_OUTPATIENT_CLINIC_OR_DEPARTMENT_OTHER): Payer: Medicaid Other | Admitting: Anesthesiology

## 2021-07-16 ENCOUNTER — Ambulatory Visit (HOSPITAL_COMMUNITY)
Admission: RE | Admit: 2021-07-16 | Discharge: 2021-07-16 | Disposition: A | Discharge status: Home / Self Care | Payer: Medicaid Other | Source: Ambulatory Visit | Attending: Gynecologic Oncology | Admitting: Gynecologic Oncology

## 2021-07-16 ENCOUNTER — Other Ambulatory Visit: Payer: Self-pay

## 2021-07-16 ENCOUNTER — Encounter (HOSPITAL_COMMUNITY)
Admission: RE | Disposition: A | Discharge status: Home / Self Care | Payer: Self-pay | Source: Ambulatory Visit | Attending: Gynecologic Oncology

## 2021-07-16 ENCOUNTER — Encounter (HOSPITAL_COMMUNITY): Payer: Self-pay | Admitting: Gynecologic Oncology

## 2021-07-16 ENCOUNTER — Ambulatory Visit (HOSPITAL_COMMUNITY): Payer: Medicaid Other | Admitting: Physician Assistant

## 2021-07-16 DIAGNOSIS — Z01818 Encounter for other preprocedural examination: Secondary | ICD-10-CM

## 2021-07-16 DIAGNOSIS — F17290 Nicotine dependence, other tobacco product, uncomplicated: Secondary | ICD-10-CM | POA: Insufficient documentation

## 2021-07-16 DIAGNOSIS — J45909 Unspecified asthma, uncomplicated: Secondary | ICD-10-CM | POA: Diagnosis not present

## 2021-07-16 DIAGNOSIS — Z79818 Long term (current) use of other agents affecting estrogen receptors and estrogen levels: Secondary | ICD-10-CM | POA: Diagnosis not present

## 2021-07-16 DIAGNOSIS — Z170 Estrogen receptor positive status [ER+]: Secondary | ICD-10-CM

## 2021-07-16 DIAGNOSIS — Z4002 Encounter for prophylactic removal of ovary: Secondary | ICD-10-CM | POA: Diagnosis not present

## 2021-07-16 DIAGNOSIS — C7951 Secondary malignant neoplasm of bone: Secondary | ICD-10-CM

## 2021-07-16 DIAGNOSIS — C50919 Malignant neoplasm of unspecified site of unspecified female breast: Secondary | ICD-10-CM

## 2021-07-16 HISTORY — PX: ROBOTIC ASSISTED BILATERAL SALPINGO OOPHERECTOMY: SHX6078

## 2021-07-16 LAB — TYPE AND SCREEN
ABO/RH(D): A POS
Antibody Screen: NEGATIVE

## 2021-07-16 LAB — PREGNANCY, URINE: Preg Test, Ur: NEGATIVE

## 2021-07-16 LAB — ABO/RH: ABO/RH(D): A POS

## 2021-07-16 SURGERY — SALPINGO-OOPHORECTOMY, BILATERAL, ROBOT-ASSISTED
Anesthesia: General | Site: Abdomen | Laterality: Bilateral

## 2021-07-16 MED ORDER — MIDAZOLAM HCL 5 MG/5ML IJ SOLN
INTRAMUSCULAR | Status: DC | PRN
  Administered 2021-07-16: 2 mg via INTRAVENOUS

## 2021-07-16 MED ORDER — LIDOCAINE HCL (PF) 2 % IJ SOLN: INTRAMUSCULAR | Status: DC | PRN

## 2021-07-16 MED ORDER — AMISULPRIDE (ANTIEMETIC) 5 MG/2ML IV SOLN
INTRAVENOUS | Status: AC
  Filled 2021-07-16: qty 4

## 2021-07-16 MED ORDER — SCOPOLAMINE 1 MG/3DAYS TD PT72
1.0000 | MEDICATED_PATCH | TRANSDERMAL | Status: DC
  Administered 2021-07-16: 1.5 mg via TRANSDERMAL
  Filled 2021-07-16: qty 1

## 2021-07-16 MED ORDER — OXYCODONE HCL 5 MG/5ML PO SOLN: 5.0000 mg | Freq: Once | ORAL | Status: DC | PRN

## 2021-07-16 MED ORDER — DEXAMETHASONE SODIUM PHOSPHATE 10 MG/ML IJ SOLN
INTRAMUSCULAR | Status: DC | PRN
  Administered 2021-07-16: 4 mg via INTRAVENOUS

## 2021-07-16 MED ORDER — FENTANYL CITRATE (PF) 100 MCG/2ML IJ SOLN
INTRAMUSCULAR | Status: AC
  Filled 2021-07-16: qty 2

## 2021-07-16 MED ORDER — KETAMINE HCL 10 MG/ML IJ SOLN
INTRAMUSCULAR | Status: DC | PRN
  Administered 2021-07-16: 10 mg via INTRAVENOUS
  Administered 2021-07-16: 30 mg via INTRAVENOUS

## 2021-07-16 MED ORDER — CHLORHEXIDINE GLUCONATE 0.12 % MT SOLN
15.0000 mL | Freq: Once | OROMUCOSAL | Status: AC
  Administered 2021-07-16: 15 mL via OROMUCOSAL

## 2021-07-16 MED ORDER — ROCURONIUM BROMIDE 10 MG/ML (PF) SYRINGE
PREFILLED_SYRINGE | INTRAVENOUS | Status: AC
  Filled 2021-07-16: qty 10

## 2021-07-16 MED ORDER — ORAL CARE MOUTH RINSE: 15.0000 mL | Freq: Once | OROMUCOSAL | Status: AC

## 2021-07-16 MED ORDER — KETAMINE HCL 10 MG/ML IJ SOLN
INTRAMUSCULAR | Status: AC
  Filled 2021-07-16: qty 1

## 2021-07-16 MED ORDER — LACTATED RINGERS IR SOLN
Status: DC | PRN
  Administered 2021-07-16: 1000 mL

## 2021-07-16 MED ORDER — SUGAMMADEX SODIUM 200 MG/2ML IV SOLN
INTRAVENOUS | Status: DC | PRN
  Administered 2021-07-16: 200 mg via INTRAVENOUS

## 2021-07-16 MED ORDER — HYDROMORPHONE HCL 1 MG/ML IJ SOLN
INTRAMUSCULAR | Status: AC
  Administered 2021-07-16: 0.5 mg via INTRAVENOUS
  Filled 2021-07-16: qty 1

## 2021-07-16 MED ORDER — MIDAZOLAM HCL 2 MG/2ML IJ SOLN
INTRAMUSCULAR | Status: AC
  Filled 2021-07-16: qty 2

## 2021-07-16 MED ORDER — LACTATED RINGERS IV SOLN: INTRAVENOUS | Status: DC

## 2021-07-16 MED ORDER — FENTANYL CITRATE (PF) 100 MCG/2ML IJ SOLN
INTRAMUSCULAR | Status: DC | PRN
  Administered 2021-07-16: 100 ug via INTRAVENOUS
  Administered 2021-07-16: 50 ug via INTRAVENOUS
  Administered 2021-07-16: 100 ug via INTRAVENOUS
  Administered 2021-07-16: 50 ug via INTRAVENOUS
  Administered 2021-07-16: 100 ug via INTRAVENOUS

## 2021-07-16 MED ORDER — ENSURE PRE-SURGERY PO LIQD
296.0000 mL | Freq: Once | ORAL | Status: DC
  Filled 2021-07-16: qty 296

## 2021-07-16 MED ORDER — KETOROLAC TROMETHAMINE 15 MG/ML IJ SOLN
INTRAMUSCULAR | Status: AC
  Administered 2021-07-16: 15 mg via INTRAVENOUS
  Filled 2021-07-16: qty 1

## 2021-07-16 MED ORDER — PROPOFOL 10 MG/ML IV BOLUS
INTRAVENOUS | Status: DC | PRN
  Administered 2021-07-16: 150 mg via INTRAVENOUS

## 2021-07-16 MED ORDER — ACETAMINOPHEN 500 MG PO TABS
1000.0000 mg | ORAL_TABLET | ORAL | Status: AC
  Administered 2021-07-16: 1000 mg via ORAL
  Filled 2021-07-16: qty 2

## 2021-07-16 MED ORDER — AMISULPRIDE (ANTIEMETIC) 5 MG/2ML IV SOLN
10.0000 mg | Freq: Once | INTRAVENOUS | Status: AC | PRN
  Administered 2021-07-16: 10 mg via INTRAVENOUS

## 2021-07-16 MED ORDER — ROCURONIUM BROMIDE 100 MG/10ML IV SOLN
INTRAVENOUS | Status: DC | PRN
  Administered 2021-07-16: 20 mg via INTRAVENOUS
  Administered 2021-07-16: 60 mg via INTRAVENOUS

## 2021-07-16 MED ORDER — HYDROMORPHONE HCL 1 MG/ML IJ SOLN
0.2500 mg | INTRAMUSCULAR | Status: DC | PRN
  Administered 2021-07-16: 0.5 mg via INTRAVENOUS

## 2021-07-16 MED ORDER — DEXAMETHASONE SODIUM PHOSPHATE 4 MG/ML IJ SOLN: 4.0000 mg | INTRAMUSCULAR | Status: DC

## 2021-07-16 MED ORDER — GABAPENTIN 300 MG PO CAPS
300.0000 mg | ORAL_CAPSULE | ORAL | Status: AC
  Administered 2021-07-16: 300 mg via ORAL
  Filled 2021-07-16: qty 1

## 2021-07-16 MED ORDER — BUPIVACAINE HCL 0.25 % IJ SOLN
INTRAMUSCULAR | Status: DC | PRN
  Administered 2021-07-16: 31 mL

## 2021-07-16 MED ORDER — ONDANSETRON HCL 4 MG/2ML IJ SOLN
INTRAMUSCULAR | Status: AC
  Filled 2021-07-16: qty 2

## 2021-07-16 MED ORDER — KETOROLAC TROMETHAMINE 15 MG/ML IJ SOLN: 15.0000 mg | INTRAMUSCULAR | Status: AC

## 2021-07-16 MED ORDER — PROPOFOL 10 MG/ML IV BOLUS
INTRAVENOUS | Status: AC
Start: 2021-07-16 — End: ?
  Filled 2021-07-16: qty 20

## 2021-07-16 MED ORDER — STERILE WATER FOR IRRIGATION IR SOLN
Status: DC | PRN
  Administered 2021-07-16: 1000 mL

## 2021-07-16 MED ORDER — LIDOCAINE HCL (CARDIAC) PF 100 MG/5ML IV SOSY
PREFILLED_SYRINGE | INTRAVENOUS | Status: DC | PRN
  Administered 2021-07-16: 60 mg via INTRAVENOUS

## 2021-07-16 MED ORDER — FENTANYL CITRATE (PF) 250 MCG/5ML IJ SOLN
INTRAMUSCULAR | Status: AC
  Filled 2021-07-16: qty 5

## 2021-07-16 MED ORDER — DEXAMETHASONE SODIUM PHOSPHATE 10 MG/ML IJ SOLN
INTRAMUSCULAR | Status: AC
  Filled 2021-07-16: qty 1

## 2021-07-16 MED ORDER — FENTANYL CITRATE PF 50 MCG/ML IJ SOSY
PREFILLED_SYRINGE | INTRAMUSCULAR | Status: AC
  Filled 2021-07-16: qty 1

## 2021-07-16 MED ORDER — BUPIVACAINE HCL 0.25 % IJ SOLN
INTRAMUSCULAR | Status: AC
  Filled 2021-07-16: qty 1

## 2021-07-16 MED ORDER — LIDOCAINE HCL (PF) 2 % IJ SOLN
INTRAMUSCULAR | Status: AC
  Filled 2021-07-16: qty 15

## 2021-07-16 MED ORDER — HEPARIN SODIUM (PORCINE) 5000 UNIT/ML IJ SOLN
5000.0000 [IU] | INTRAMUSCULAR | Status: AC
  Administered 2021-07-16: 5000 [IU] via SUBCUTANEOUS
  Filled 2021-07-16: qty 1

## 2021-07-16 MED ORDER — MEPERIDINE HCL 50 MG/ML IJ SOLN: 6.2500 mg | INTRAMUSCULAR | Status: DC | PRN

## 2021-07-16 MED ORDER — LIDOCAINE HCL (PF) 2 % IJ SOLN
INTRAMUSCULAR | Status: DC | PRN
  Administered 2021-07-16: 1.5 mg/kg/h

## 2021-07-16 MED ORDER — OXYCODONE HCL 5 MG PO TABS: 5.0000 mg | ORAL_TABLET | Freq: Once | ORAL | Status: DC | PRN

## 2021-07-16 SURGICAL SUPPLY — 72 items
APPLICATOR SURGIFLO ENDO (HEMOSTASIS) IMPLANT
BACTOSHIELD CHG 4% 4OZ (MISCELLANEOUS) ×1
BAG LAPAROSCOPIC 12 15 PORT 16 (BASKET) IMPLANT
BAG RETRIEVAL 10 (BASKET) ×1
BAG RETRIEVAL 12/15 (BASKET)
BLADE SURG SZ10 CARB STEEL (BLADE) IMPLANT
COVER BACK TABLE 60X90IN (DRAPES) ×2 IMPLANT
COVER TIP SHEARS 8 DVNC (MISCELLANEOUS) ×1 IMPLANT
COVER TIP SHEARS 8MM DA VINCI (MISCELLANEOUS) ×2
DERMABOND ADVANCED (GAUZE/BANDAGES/DRESSINGS) ×1
DERMABOND ADVANCED .7 DNX12 (GAUZE/BANDAGES/DRESSINGS) ×1 IMPLANT
DRAPE ARM DVNC X/XI (DISPOSABLE) ×4 IMPLANT
DRAPE COLUMN DVNC XI (DISPOSABLE) ×1 IMPLANT
DRAPE DA VINCI XI ARM (DISPOSABLE) ×8
DRAPE DA VINCI XI COLUMN (DISPOSABLE) ×2
DRAPE SHEET LG 3/4 BI-LAMINATE (DRAPES) ×2 IMPLANT
DRAPE SURG IRRIG POUCH 19X23 (DRAPES) ×2 IMPLANT
DRSG OPSITE POSTOP 4X6 (GAUZE/BANDAGES/DRESSINGS) IMPLANT
DRSG OPSITE POSTOP 4X8 (GAUZE/BANDAGES/DRESSINGS) IMPLANT
ELECT PENCIL ROCKER SW 15FT (MISCELLANEOUS) ×1 IMPLANT
ELECT REM PT RETURN 15FT ADLT (MISCELLANEOUS) ×2 IMPLANT
GAUZE 4X4 16PLY ~~LOC~~+RFID DBL (SPONGE) ×2 IMPLANT
GLOVE BIO SURGEON STRL SZ 6 (GLOVE) ×8 IMPLANT
GLOVE BIO SURGEON STRL SZ 6.5 (GLOVE) ×4 IMPLANT
GOWN STRL REUS W/ TWL LRG LVL3 (GOWN DISPOSABLE) ×4 IMPLANT
GOWN STRL REUS W/TWL LRG LVL3 (GOWN DISPOSABLE) ×8
HOLDER FOLEY CATH W/STRAP (MISCELLANEOUS) IMPLANT
IRRIG SUCT STRYKERFLOW 2 WTIP (MISCELLANEOUS) ×2
IRRIGATION SUCT STRKRFLW 2 WTP (MISCELLANEOUS) ×1 IMPLANT
KIT PROCEDURE DA VINCI SI (MISCELLANEOUS)
KIT PROCEDURE DVNC SI (MISCELLANEOUS) IMPLANT
KIT TURNOVER KIT A (KITS) IMPLANT
LIGASURE IMPACT 36 18CM CVD LR (INSTRUMENTS) IMPLANT
MANIPULATOR ADVINCU DEL 3.0 PL (MISCELLANEOUS) IMPLANT
MANIPULATOR ADVINCU DEL 3.5 PL (MISCELLANEOUS) IMPLANT
MANIPULATOR UTERINE 4.5 ZUMI (MISCELLANEOUS) IMPLANT
NDL HYPO 21X1.5 SAFETY (NEEDLE) ×1 IMPLANT
NDL SPNL 18GX3.5 QUINCKE PK (NEEDLE) IMPLANT
NEEDLE HYPO 21X1.5 SAFETY (NEEDLE) ×2 IMPLANT
NEEDLE SPNL 18GX3.5 QUINCKE PK (NEEDLE) IMPLANT
OBTURATOR OPTICAL STANDARD 8MM (TROCAR) ×2
OBTURATOR OPTICAL STND 8 DVNC (TROCAR) ×1
OBTURATOR OPTICALSTD 8 DVNC (TROCAR) ×1 IMPLANT
PACK ROBOT GYN CUSTOM WL (TRAY / TRAY PROCEDURE) ×2 IMPLANT
PAD POSITIONING PINK XL (MISCELLANEOUS) ×2 IMPLANT
PORT ACCESS TROCAR AIRSEAL 12 (TROCAR) ×1 IMPLANT
PORT ACCESS TROCAR AIRSEAL 5M (TROCAR) ×1
SCRUB CHG 4% DYNA-HEX 4OZ (MISCELLANEOUS) ×1 IMPLANT
SEAL CANN UNIV 5-8 DVNC XI (MISCELLANEOUS) ×4 IMPLANT
SEAL XI 5MM-8MM UNIVERSAL (MISCELLANEOUS) ×8
SET TRI-LUMEN FLTR TB AIRSEAL (TUBING) ×2 IMPLANT
SPIKE FLUID TRANSFER (MISCELLANEOUS) ×2 IMPLANT
SURGIFLO W/THROMBIN 8M KIT (HEMOSTASIS) IMPLANT
SUT MNCRL AB 4-0 PS2 18 (SUTURE) IMPLANT
SUT PDS AB 1 TP1 96 (SUTURE) IMPLANT
SUT VIC AB 0 CT1 27 (SUTURE)
SUT VIC AB 0 CT1 27XBRD ANTBC (SUTURE) IMPLANT
SUT VIC AB 2-0 CT1 27 (SUTURE)
SUT VIC AB 2-0 CT1 TAPERPNT 27 (SUTURE) IMPLANT
SUT VIC AB 4-0 PS2 18 (SUTURE) ×4 IMPLANT
SYR 10ML LL (SYRINGE) IMPLANT
SYS BAG RETRIEVAL 10MM (BASKET) ×1
SYS WOUND ALEXIS 18CM MED (MISCELLANEOUS)
SYSTEM BAG RETRIEVAL 10MM (BASKET) IMPLANT
SYSTEM WOUND ALEXIS 18CM MED (MISCELLANEOUS) IMPLANT
TOWEL OR NON WOVEN STRL DISP B (DISPOSABLE) IMPLANT
TRAP SPECIMEN MUCUS 40CC (MISCELLANEOUS) IMPLANT
TRAY FOLEY MTR SLVR 16FR STAT (SET/KITS/TRAYS/PACK) ×2 IMPLANT
TROCAR Z-THREAD FIOS 5X100MM (TROCAR) IMPLANT
UNDERPAD 30X36 HEAVY ABSORB (UNDERPADS AND DIAPERS) ×4 IMPLANT
WATER STERILE IRR 1000ML POUR (IV SOLUTION) ×2 IMPLANT
YANKAUER SUCT BULB TIP 10FT TU (MISCELLANEOUS) IMPLANT

## 2021-07-16 NOTE — Transfer of Care (Signed)
Immediate Anesthesia Transfer of Care Note  Patient: Ellen [name]  Procedure(s) Performed: XI ROBOTIC ASSISTED BILATERAL SALPINGO OOPHORECTOMY (Bilateral: Abdomen)  Patient Location: PACU  Anesthesia Type:General  Level of Consciousness: awake, alert , oriented and patient cooperative  Airway & Oxygen Therapy: Patient Spontanous Breathing and Patient connected to face mask oxygen  Post-op Assessment: Report given to RN, Post -op Vital signs reviewed and stable and Patient moving all extremities X 4  Post vital signs: Reviewed and stable  Last Vitals:  Vitals Value Taken Time  BP 124/91 07/16/21 1215  Temp    Pulse 81 07/16/21 1220  Resp 12 07/16/21 1221  SpO2 98 % 07/16/21 1220  Vitals shown include unvalidated device data.  Last Pain:  Vitals:   07/16/21 0816  TempSrc: Oral         Complications: No notable events documented.

## 2021-07-16 NOTE — Anesthesia Preprocedure Evaluation (Addendum)
Anesthesia Evaluation  Patient identified by MRN, date of birth, ID band Patient awake    Reviewed: Allergy & Precautions, NPO status , Patient's Chart, lab work & pertinent test results  Airway Mallampati: I  TM Distance: >3 FB Neck ROM: Full    Dental no notable dental hx. (+) Dental Advisory Given, Teeth Intact   Pulmonary asthma , Patient abstained from smoking.,  Exercise induced   Pulmonary exam normal breath sounds clear to auscultation       Cardiovascular negative cardio ROS Normal cardiovascular exam Rhythm:Regular Rate:Normal  Echo 06/26/2021 1. Left ventricular ejection fraction, by estimation, is 65 to 70%. The left ventricle has normal function. The left ventricle has no regional wall motion abnormalities. Left ventricular diastolic parameters were normal.  2. Right ventricular systolic function is normal. The right ventricular size is normal. There is normal pulmonary artery systolic pressure.  3. The mitral valve is normal in structure. No evidence of mitral valve regurgitation. No evidence of mitral stenosis.  4. The aortic valve is normal in structure. Aortic valve regurgitation is not visualized. No aortic stenosis is present.  5. The inferior vena cava is normal in size with greater than 50% respiratory variability, suggesting right atrial pressure of 3 mmHg.    Neuro/Psych PSYCHIATRIC DISORDERS Anxiety Depression negative neurological ROS     GI/Hepatic GERD  ,(+)     substance abuse  marijuana use,   Endo/Other  Right breast Ca with metastasis to bone  Renal/GU Renal disease     Musculoskeletal negative musculoskeletal ROS (+)   Abdominal   Peds  Hematology  (+) Blood dyscrasia, anemia ,   Anesthesia Other Findings   Reproductive/Obstetrics                            Anesthesia Physical Anesthesia Plan  ASA: 2  Anesthesia Plan: General   Post-op Pain  Management: Tylenol PO (pre-op)*, Gabapentin PO (pre-op)* and Ketamine IV*   Induction: Intravenous  PONV Risk Score and Plan: 4 or greater and Ondansetron, Dexamethasone, Midazolam, Treatment may vary due to age or medical condition and Scopolamine patch - Pre-op  Airway Management Planned: Oral ETT  Additional Equipment: None  Intra-op Plan:   Post-operative Plan: Extubation in OR  Informed Consent: I have reviewed the patients History and Physical, chart, labs and discussed the procedure including the risks, benefits and alternatives for the proposed anesthesia with the patient or authorized representative who has indicated his/her understanding and acceptance.     Dental advisory given  Plan Discussed with: CRNA  Anesthesia Plan Comments:       Anesthesia Quick Evaluation

## 2021-07-16 NOTE — Anesthesia Procedure Notes (Signed)
Procedure Name: Intubation Date/Time: 07/16/2021 10:50 AM Performed by: Jonna Munro, CRNA Pre-anesthesia Checklist: Patient identified, Emergency Drugs available, Suction available, Patient being monitored and Timeout performed Patient Re-evaluated:Patient Re-evaluated prior to induction Oxygen Delivery Method: Circle system utilized Preoxygenation: Pre-oxygenation with 100% oxygen Induction Type: IV induction Ventilation: Mask ventilation without difficulty Laryngoscope Size: Mac and 3 Grade View: Grade I Tube type: Oral Tube size: 7.0 mm Number of attempts: 1 Airway Equipment and Method: Stylet Placement Confirmation: positive ETCO2, ETT inserted through vocal cords under direct vision and breath sounds checked- equal and bilateral Secured at: 22 cm Tube secured with: Tape Dental Injury: Teeth and Oropharynx as per pre-operative assessment

## 2021-07-16 NOTE — Anesthesia Postprocedure Evaluation (Signed)
Anesthesia Post Note  Patient: Ellen [name]  Procedure(s) Performed: XI ROBOTIC ASSISTED BILATERAL SALPINGO OOPHORECTOMY (Bilateral: Abdomen)     Patient location during evaluation: PACU Anesthesia Type: General Level of consciousness: sedated and patient cooperative Pain management: pain level controlled Vital Signs Assessment: post-procedure vital signs reviewed and stable Respiratory status: spontaneous breathing Cardiovascular status: stable Anesthetic complications: no   No notable events documented.  Last Vitals:  Vitals:   07/16/21 1230 07/16/21 1245  BP: (!) 122/58 (!) 155/87  Pulse: 65 86  Resp: 17 13  Temp:    SpO2: 100% 100%    Last Pain:  Vitals:   07/16/21 1245  TempSrc:   PainSc: [place]

## 2021-07-16 NOTE — Discharge Instructions (Signed)
AFTER SURGERY INSTRUCTIONS   Return to work: 4-6 weeks if applicable   Activity: 1. Be up and out of the bed during the day.  Take a nap if needed.  You may walk up steps but be careful and use the hand rail.  Stair climbing will tire you more than you think, you may need to stop part way and rest.    2. No lifting or straining for 6 weeks over 10 pounds. No pushing, pulling, straining for 6 weeks.   3. No driving for around 1 week(s).  Do not drive if you are taking narcotic pain medicine and make sure that your reaction time has returned.    4. You can shower as soon as the next day after surgery. Shower daily.  Use your regular soap and water (not directly on the incision) and pat your incision(s) dry afterwards; don't rub.  No tub baths or submerging your body in water until cleared by your surgeon. If you have the soap that was given to you by pre-surgical testing that was used before surgery, you do not need to use it afterwards because this can irritate your incisions.    5. No sexual activity and nothing in the vagina for 4 weeks.   6. You may experience a small amount of clear drainage from your incisions, which is normal.  If the drainage persists, increases, or changes color please call the office.   7. Do not use creams, lotions, or ointments such as neosporin on your incisions after surgery until advised by your surgeon because they can cause removal of the dermabond glue on your incisions.     8. You may experience vaginal spotting after surgery.  The spotting is normal but if you experience heavy bleeding, call our office.   9. Take Tylenol or ibuprofen first for pain and only use narcotic pain medication for severe pain not relieved by the Tylenol or Ibuprofen.  Monitor your Tylenol intake to a max of 4,000 mg in a 24 hour period. You can alternate these medications after surgery.   Diet: 1. Low sodium Heart Healthy Diet is recommended but you are cleared to resume your normal  (before surgery) diet after your procedure.   2. It is safe to use a laxative, such as Miralax or Colace, if you have difficulty moving your bowels. You have been prescribed Sennakot-S to take at bedtime every evening after surgery to keep bowel movements regular and to prevent constipation.     Wound Care: 1. Keep clean and dry.  Shower daily.   Reasons to call the Doctor: Fever - Oral temperature greater than 100.4 degrees Fahrenheit Foul-smelling vaginal discharge Difficulty urinating Nausea and vomiting Increased pain at the site of the incision that is unrelieved with pain medicine. Difficulty breathing with or without chest pain New calf pain especially if only on one side Sudden, continuing increased vaginal bleeding with or without clots.   Contacts: For questions or concerns you should contact:   Dr. Jeral Pinch at [phone]   Joylene John, NP at [phone]   After Hours: call [phone] and have the GYN Oncologist paged/contacted (after 5 pm or on the weekends).   Messages sent via mychart are for non-urgent matters and are not responded to after hours so for urgent needs, please call the after hours number.

## 2021-07-16 NOTE — H&P (Signed)
Gynecologic Oncology H&P  07/16/21  Treatment History: Oncology History  Primary malignant neoplasm of breast with metastasis ([place])  08/15/2020 Imaging   Large mass in the medial left breast measuring 3.5 cm.  Prominent left axillary lymph node cluster of adjacent lymph nodes, irregular nodule right middle lobe 1.1 cm, aggressive lesion right second rib cortical destruction, multiple lucent lesions throughout the thoracic spine including T1 vertebral body and T2, irregular multilobar lesion central left hepatic lobe 4.3 x 4.7 cm.  Several satellite lesions in the left lateral hepatic lobe.  Multiple sclerotic lesions in the bones iliac wings, acetabular, medial iliac bones, sacrum multiple lesions lumbar spine L3-L4 and L5   08/17/2020 Initial Diagnosis   Biopsy revealed IDC with DCIS grade 2, ER 30%, PR 20%, HER2 equivocal by IHC, FISH positive, Ki-67 25%, lymph node positive    Genetic Testing   Negative genetic testing:  No pathogenic variants detected on the [employer] CancerNext-Expanded + RNAinsight panel. The report date is 09/06/2020.   The CancerNext-Expanded + RNAinsight gene panel offered by [employer] and includes sequencing and rearrangement analysis for the following 77 genes: AIP, ALK, APC, ATM, AXIN2, BAP1, BARD1, BLM, BMPR1A, BRCA1, BRCA2, BRIP1, CDC73, CDH1, CDK4, CDKN1B, CDKN2A, CHEK2, CTNNA1, DICER1, FANCC, FH, FLCN, GALNT12, KIF1B, LZTR1, MAX, MEN1, MET, MLH1, MSH2, MSH3, MSH6, MUTYH, NBN, NF1, NF2, NTHL1, PALB2, PHOX2B, PMS2, POT1, PRKAR1A, PTCH1, PTEN, RAD51C, RAD51D, RB1, RECQL, RET, SDHA, SDHAF2, SDHB, SDHC, SDHD, SMAD4, SMARCA4, SMARCB1, SMARCE1, STK11, SUFU, TMEM127, TP53, TSC1, TSC2, VHL and XRCC2 (sequencing and deletion/duplication); EGFR, EGLN1, HOXB13, KIT, MITF, PDGFRA, POLD1 and POLE (sequencing only); EPCAM and GREM1 (deletion/duplication only). RNA data is routinely analyzed for use in variant interpretation for all genes.   Carcinoma of breast metastatic to bone ([place])   08/17/2020 Initial Diagnosis   Carcinoma of breast metastatic to bone [facility]); Goserelin/Zoladex started and to be given every 4 weeks    Genetic Testing   Negative genetic testing:  No pathogenic variants detected on the [employer] CancerNext-Expanded + RNAinsight panel. The report date is 09/06/2020.   The CancerNext-Expanded + RNAinsight gene panel offered by [employer] and includes sequencing and rearrangement analysis for the following 77 genes: AIP, ALK, APC, ATM, AXIN2, BAP1, BARD1, BLM, BMPR1A, BRCA1, BRCA2, BRIP1, CDC73, CDH1, CDK4, CDKN1B, CDKN2A, CHEK2, CTNNA1, DICER1, FANCC, FH, FLCN, GALNT12, KIF1B, LZTR1, MAX, MEN1, MET, MLH1, MSH2, MSH3, MSH6, MUTYH, NBN, NF1, NF2, NTHL1, PALB2, PHOX2B, PMS2, POT1, PRKAR1A, PTCH1, PTEN, RAD51C, RAD51D, RB1, RECQL, RET, SDHA, SDHAF2, SDHB, SDHC, SDHD, SMAD4, SMARCA4, SMARCB1, SMARCE1, STK11, SUFU, TMEM127, TP53, TSC1, TSC2, VHL and XRCC2 (sequencing and deletion/duplication); EGFR, EGLN1, HOXB13, KIT, MITF, PDGFRA, POLD1 and POLE (sequencing only); EPCAM and GREM1 (deletion/duplication only). RNA data is routinely analyzed for use in variant interpretation for all genes.   08/18/2020 - 08/30/2020 Radiation Therapy   Palliative radiation to the lumbar and pelvic bone; 30 Gy in 10 fractions   08/31/2020 -  Adjuvant Chemotherapy   Started with Herceptin/Perjeta on 08/31/2020 and Taxotere on 09/04/2020; she was hospitalized with dehydration and refractory diarrhea for 11 days, she resumed treatment with Taxotere (dose reduced by 27m/m2) and Herceptin only (perjeta discontinued) on 09/26/2020, reintroduced 10/17/2020   09/03/2020 Cancer Staging   Staging form: Breast, AJCC 8th Edition - Clinical stage from 09/03/2020: Stage IV (cT4b, cN1, cM1, G2, ER+, PR+, HER2+) - Signed by CGardenia Phlegm NP on 10/03/2020 Stage prefix: Initial diagnosis Method of lymph node assessment: Clinical Histologic grading system: 3 grade system  The patient was diagnosed  with 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  She completed palliative radiation in July 2022 and Taxotere/Herceptin/Perjeta in November.  She has been on Herceptin/Perjeta maintenance since the end of November 2022.  She is on Zoladex for suppression of her ovaries. She recently had restaging scans completed March 21, 2021.  These showed grossly similar left breast mass and stable metastatic disease.  No new definitive extraskeletal metastatic disease was noted.   She notes overall doing well.  On Zoladex, she has experienced some dyspareunia as well as hot flashes.  She uses Effexor at night which helps with her hot sweats.  Appetite has been improving, she denies any nausea or emesis.  Bowel function has improved significantly since January and she reports that it is back to baseline.  She has some urinary urgency and frequency although these have also improved since she has completed Taxotere.   She denies any vaginal bleeding since being on Zoladex.  Prior to this, she notes that her menses were somewhat heavy and would last 7 to 9 days.  She has previously been on Depo, use the Nexplanon, and most recently was on birth control pills.  Her menses were longer but less heavy on oral contraceptive pills.  Now, although she does not have any menses, she has occasional cramping around the time of her Zoladex injection.   Patient lives in [place].  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  Her mother lives nearby.  She endorses some social alcohol use.  Uses THC to help with appetite as well as side effects related to cancer treatment.   Interval History: Doing well since last office visit with me.  Past Medical/Surgical History: Past Medical History:  Diagnosis Date   ADHD (attention deficit hyperactivity disorder)    Anemia    Anxiety    Asthma    Exercise Induced   Cancer ([place])    Depression    Family history of pancreatic cancer    Family history of thyroid cancer     GERD (gastroesophageal reflux disease)    Herpes simplex    Personal history of chemotherapy 08/2020    Past Surgical History:  Procedure Laterality Date   BREAST BIOPSY Left 08/2020   PORTACATH PLACEMENT Right 08/23/2020   Procedure: INSERTION PORT-A-CATH;  Surgeon: [name], MD;  Location: [facility];  Service: General;  Laterality: Right;    Family History  Problem Relation Age of Onset   Depression Mother    Hyperlipidemia Mother    Thyroid cancer Mother 73       papillary thyroid cancer   Hypertension Father    Atrial fibrillation Father    Irritable bowel syndrome Father        also brother   ADD / ADHD Brother    Bipolar disorder Brother    Diabetes Maternal Grandfather    Heart attack Paternal Great-grandfather 81   Pancreatic cancer Maternal Great-grandfather 88       (MGM's father)   Colon cancer Neg Hx    Breast cancer Neg Hx    Ovarian cancer Neg Hx    Endometrial cancer Neg Hx    Prostate cancer Neg Hx     Social History   Socioeconomic History   Marital status: Single    Spouse name: Not on file   Number of children: Not on file   Years of education: Not on file   Highest education level: Not on file  Occupational History  Not on file  Tobacco Use   Smoking status: Never   Smokeless tobacco: Never  Vaping Use   Vaping Use: Every day   Substances: THC  Substance and Sexual Activity   Alcohol use: Not Currently    Comment: 3 to 4 a week   Drug use: Yes    Types: Marijuana    Comment: Daily   Sexual activity: Yes  Other Topics Concern   Not on file  Social History Narrative   Menarche at age 59, has a cycles every 28 days that lasts about 11 days, 4 days of spotting, two days of heavy flow, and the other days mild to moderate flow.  Received Depo provera shot x 3 years, Nexplanon x 2 years, and currently has kyleena IUD.  No OCPs.  She has never been pregnant.    Social Determinants of Health   Financial Resource  Strain: High Risk   Difficulty of Paying Living Expenses: Very hard  Food Insecurity: No Food Insecurity   Worried About Charity fundraiser in the Last Year: Never true   Ran Out of Food in the Last Year: Never true  Transportation Needs: No Transportation Needs   Lack of Transportation (Medical): No   Lack of Transportation (Non-Medical): No  Physical Activity: Not on file  Stress: Not on file  Social Connections: Not on file    Current Medications:  Current Facility-Administered Medications:    dexamethasone (DECADRON) injection 4 mg, 4 mg, Intravenous, On Call to OR, Cross, Melissa D, NP   feeding supplement (ENSURE PRE-SURGERY) liquid 296 mL, 296 mL, Oral, Once, Cross, Melissa D, NP   ketorolac (TORADOL) 15 MG/ML injection 15 mg, 15 mg, Intravenous, On Call to OR, Cross, Carollee Massed, NP   lactated ringers infusion, , Intravenous, Continuous, Josephine Igo, MD, Last Rate: 10 mL/hr at 07/16/21 0831, New Bag at 07/16/21 0831   scopolamine (TRANSDERM-SCOP) 1 MG/3DAYS 1.5 mg, 1 patch, Transdermal, On Call to OR, Cross, Melissa D, NP, 1.5 mg at 07/16/21 1610  Review of Systems: + Dyspareunia, hot flashes Denies appetite changes, fevers, chills, fatigue, unexplained weight changes. Denies hearing loss, neck lumps or masses, mouth sores, ringing in ears or voice changes. Denies cough or wheezing.  Denies shortness of breath. Denies chest pain or palpitations. Denies leg swelling. Denies abdominal distention, pain, blood in stools, constipation, diarrhea, nausea, vomiting, or early satiety. Denies dysuria, frequency, hematuria or incontinence. Denies pelvic pain, vaginal bleeding or vaginal discharge.   Denies joint pain, back pain or muscle pain/cramps. Denies itching, rash, or wounds. Denies dizziness, headaches, numbness or seizures. Denies swollen lymph nodes or glands, denies easy bruising or bleeding. Denies anxiety, depression, confusion, or decreased concentration.  Physical  Exam: BP 119/82   Pulse 99   Temp 99.1 F (37.3 C) (Oral)   Resp 16   SpO2 100%  General: Alert, oriented, no acute distress.  HEENT: Normocephalic, atraumatic. Sclera anicteric.  Chest: Clear to auscultation bilaterally. No wheezes, rhonchi, or rales. Cardiovascular: Regular rate (80s on auscultation) and rhythm, no murmurs, rubs, or gallops.  Abdomen: Normoactive bowel sounds. Soft, nondistended, nontender to palpation. No masses or hepatosplenomegaly appreciated. No palpable fluid wave.  Extremities: Grossly normal range of motion. Warm, well perfused. No edema bilaterally.   Laboratory & Radiologic Studies: PET 07/05/21: IMPRESSION: 1. Interval decrease in size of the dominant left breast mass which now exhibits mild low level FDG uptake with SUV max of 2.7. Small nodule within the left breast exhibits moderate tracer  uptake within SUV max of 5.6 and is suspicious for residual/recurrent local tumor. 2. Mild tracer uptake associated with prominent left axillary lymph node with SUV max of 3.35. Residual tracer avid nodal metastasis not excluded. 3. The previously noted liver metastasis are no longer visible on the CT images from today's study. There is no abnormal increased uptake within the left hepatic lobe above background liver activity to suggest metabolically active tumor metastasis. 4. Interval increase in sclerosis associated with previously noted lytic bone metastasis without significant increased uptake above background bone marrow activity compatible with healing multifocal bone metastasis. 5. New pathologic fracture involves the L3 vertebral body where there was extensive lytic changes noted on the previous CT. The collapsed vertebral body is now sclerotic with mild low level FDG uptake. 6. Similar appearance of soft tissue infiltration within the anterior mediastinal fat which exhibits mild FDG uptake on today's study. Favor thymic hyperplasia secondary to  treatment changes.    Assessment & Plan: Ellen [name] is a 26 y.o. woman with metastatic ER positive breast cancer.  Patient presents for therapeutic BSO. See prior notes for counseling.   Jeral Pinch, MD  Division of Gynecologic Oncology  Department of Obstetrics and Gynecology  [facility]

## 2021-07-16 NOTE — Op Note (Signed)
OPERATIVE NOTE  Pre-operative Diagnosis: ER+ metastatic breast cancer  Post-operative Diagnosis: same  Operation: Robotic-assisted laparoscopic bilateral salpingo-oophorectomy   Surgeon: Jeral Pinch MD  Assistant Surgeon: Lahoma Crocker MD (an MD assistant was necessary for tissue manipulation, management of robotic instrumentation, retraction and positioning due to the complexity of the case and hospital policies).   Anesthesia: GET  Urine Output: 350cc  Operative Findings: On EUA, small mobile uterus, no adnexal masses. On intra-abdominal entry, minimal scaring on liver. Normal omentum, small and large bowel. Uterus 4-6cm and normal appearing. Normal and atrophic bilateral adnexa. Minimal peritoneal findings of suspicious for endosalpingiosis (versus metastatic disease). No ascites.   Estimated Blood Loss:  25cc      Total IV Fluids: see I&O flowsheet         Specimens: bilateral tubes and ovaries         Complications:  None apparent; patient tolerated the procedure well.         Disposition: PACU - hemodynamically stable.  Procedure Details  The patient was seen in the Holding Room. The risks, benefits, complications, treatment options, and expected outcomes were discussed with the patient.  The patient concurred with the proposed plan, giving informed consent.  The site of surgery properly noted/marked. The patient was identified as Ellen [name] and the procedure verified as a Robotic-assisted bilateral salpingo-oophorectomy with any other indicated procedures.   After induction of anesthesia, the patient was draped and prepped in the usual sterile manner. Patient was placed in supine position after anesthesia and draped and prepped in the usual sterile manner as follows: Her arms were tucked to her side with all appropriate precautions.  The shoulders were stabilized with padded shoulder blocks applied to the acromium processes.  The patient was placed in the  semi-lithotomy position in [place].  The perineum and vagina were prepped with CholoraPrep. The patient was draped after the CholoraPrep had been allowed to dry for 3 minutes.  A Time Out was held and the above information confirmed.  The urethra was prepped with Betadine. Foley catheter was placed.  A sterile speculum was placed in the vagina.  The cervix was grasped with a single-tooth tenaculum. A Hulka manipulator was inserted.  OG tube placement was confirmed and to suction.   Next, a 10 mm skin incision was made 1 cm below the subcostal margin in the midclavicular line.  The 5 mm Optiview port and scope was used for direct entry.  Opening pressure was under 10 mm CO2.  The abdomen was insufflated and the findings were noted as above.   At this point and all points during the procedure, the patient's intra-abdominal pressure did not exceed 15 mmHg. Next, an 8 mm skin incision was made inferior to the umbilicus and a right and left port were placed about 8 cm lateral to the robot port on the right and left side.  The 5 mm assist trocar was exchanged for a 10-12 mm port. All ports were placed under direct visualization.  The patient was placed in steep Trendelenburg.  Bowel was folded away into the upper abdomen.  The robot was docked in the normal manner.  The right and left peritoneum were opened parallel to the IP ligament to open the retroperitoneal spaces bilaterally. The round ligaments were preserved. The ureter was noted to be on the medial leaf of the broad ligament.  The peritoneum above the ureter was incised and stretched and the infundibulopelvic ligament was skeletonized, cauterized and cut.  The utero-ovarian ligament and fallopian tube were skeletonized, cauterized and transected just lateral to the uterine fundus, freeing the adnexa. Both adnexa were placed in an Endocatch bag and removed through the assist trocar.   Irrigation was used and excellent hemostasis was achieved.  Insufflation was decreased to 5 mm og Hg and hemostasis assured. At this point in the procedure was completed.  Robotic instruments were removed under direct visulaization.  The robot was undocked. The deep subcutaneous tissue at the 10-12 mm port was closed with 0 Vicryl on a UR-5 needle.  The subcuticular tissue was closed with 4-0 Vicryl and the skin was closed with 4-0 Monocryl in a subcuticular manner.  Dermabond was applied.    The vagina was swabbed with minimal bleeding noted after Hulka was removed. Foley catheter was removed.  All sponge, lap and needle counts were correct x  3.   The patient was transferred to the recovery room in stable condition.  Jeral Pinch, MD

## 2021-07-17 ENCOUNTER — Telehealth: Payer: Self-pay

## 2021-07-17 ENCOUNTER — Encounter (HOSPITAL_COMMUNITY): Payer: Self-pay | Admitting: Gynecologic Oncology

## 2021-07-17 NOTE — Telephone Encounter (Signed)
Attempted to reach patient again to check in with her post-operatively. Unable to reach patient. Left message requesting return call.

## 2021-07-17 NOTE — Telephone Encounter (Signed)
Attempted to reach patient to check in with her post-operatively. Unable to reach patient. Left message requesting return call.   

## 2021-07-18 ENCOUNTER — Encounter: Payer: Self-pay | Admitting: Hematology and Oncology

## 2021-07-18 ENCOUNTER — Telehealth: Payer: Self-pay | Admitting: *Deleted

## 2021-07-18 LAB — SURGICAL PATHOLOGY

## 2021-07-18 NOTE — Telephone Encounter (Signed)
Spoke with pt to remind her to restart her Anastrozole 3 days post surgery as per the recommendation of Dr.Gudena. Pt verbalized understanding.

## 2021-07-18 NOTE — Telephone Encounter (Signed)
Spoke with Ellen [name] this morning. She states she is eating, drinking and urinating well. She has had 2 BMs and is passing gas. She is taking senokot as prescribed and encouraged her to drink plenty of water. She denies fever or chills. Incisions are dry and intact. She rates her pain 2/10. Her pain is controlled with ibuprofen.     Instructed to call office with any fever, chills, purulent drainage, uncontrolled pain or any other questions or concerns. Patient verbalizes understanding.   Pt aware of post op appointments as well as the office number [phone] and after hours number [phone] to call if she has any questions or concerns

## 2021-07-24 ENCOUNTER — Other Ambulatory Visit: Payer: Self-pay | Admitting: Adult Health

## 2021-07-25 ENCOUNTER — Encounter: Payer: Self-pay | Admitting: Hematology and Oncology

## 2021-07-26 ENCOUNTER — Other Ambulatory Visit: Payer: Self-pay | Admitting: Hematology and Oncology

## 2021-07-30 ENCOUNTER — Inpatient Hospital Stay: Payer: Medicaid Other

## 2021-07-30 ENCOUNTER — Inpatient Hospital Stay (HOSPITAL_BASED_OUTPATIENT_CLINIC_OR_DEPARTMENT_OTHER): Payer: Medicaid Other | Admitting: Adult Health

## 2021-07-30 ENCOUNTER — Other Ambulatory Visit: Payer: Self-pay

## 2021-07-30 ENCOUNTER — Encounter: Payer: Self-pay | Admitting: Gynecologic Oncology

## 2021-07-30 ENCOUNTER — Inpatient Hospital Stay: Payer: Medicaid Other | Attending: Adult Health

## 2021-07-30 VITALS — BP 119/72 | HR 76 | Temp 98.9°F | Resp 16 | Ht 65.0 in | Wt 146.0 lb

## 2021-07-30 DIAGNOSIS — C787 Secondary malignant neoplasm of liver and intrahepatic bile duct: Secondary | ICD-10-CM | POA: Diagnosis not present

## 2021-07-30 DIAGNOSIS — Z9079 Acquired absence of other genital organ(s): Secondary | ICD-10-CM | POA: Diagnosis not present

## 2021-07-30 DIAGNOSIS — C50912 Malignant neoplasm of unspecified site of left female breast: Secondary | ICD-10-CM

## 2021-07-30 DIAGNOSIS — Z9221 Personal history of antineoplastic chemotherapy: Secondary | ICD-10-CM | POA: Insufficient documentation

## 2021-07-30 DIAGNOSIS — Z923 Personal history of irradiation: Secondary | ICD-10-CM | POA: Insufficient documentation

## 2021-07-30 DIAGNOSIS — Z95828 Presence of other vascular implants and grafts: Secondary | ICD-10-CM

## 2021-07-30 DIAGNOSIS — Z79811 Long term (current) use of aromatase inhibitors: Secondary | ICD-10-CM | POA: Diagnosis not present

## 2021-07-30 DIAGNOSIS — C50919 Malignant neoplasm of unspecified site of unspecified female breast: Secondary | ICD-10-CM

## 2021-07-30 DIAGNOSIS — Z5112 Encounter for antineoplastic immunotherapy: Secondary | ICD-10-CM | POA: Diagnosis present

## 2021-07-30 DIAGNOSIS — N6320 Unspecified lump in the left breast, unspecified quadrant: Secondary | ICD-10-CM

## 2021-07-30 DIAGNOSIS — C7951 Secondary malignant neoplasm of bone: Secondary | ICD-10-CM | POA: Insufficient documentation

## 2021-07-30 DIAGNOSIS — Z5111 Encounter for antineoplastic chemotherapy: Secondary | ICD-10-CM | POA: Insufficient documentation

## 2021-07-30 DIAGNOSIS — F17290 Nicotine dependence, other tobacco product, uncomplicated: Secondary | ICD-10-CM | POA: Insufficient documentation

## 2021-07-30 DIAGNOSIS — R918 Other nonspecific abnormal finding of lung field: Secondary | ICD-10-CM | POA: Diagnosis not present

## 2021-07-30 DIAGNOSIS — Z90722 Acquired absence of ovaries, bilateral: Secondary | ICD-10-CM | POA: Insufficient documentation

## 2021-07-30 LAB — CBC WITH DIFFERENTIAL (CANCER CENTER ONLY)
Abs Immature Granulocytes: 0.01 10*3/uL (ref 0.00–0.07)
Basophils Absolute: 0 10*3/uL (ref 0.0–0.1)
Basophils Relative: 0 %
Eosinophils Absolute: 0.2 10*3/uL (ref 0.0–0.5)
Eosinophils Relative: 2 %
HCT: 37 % (ref 36.0–46.0)
Hemoglobin: 12.3 g/dL (ref 12.0–15.0)
Immature Granulocytes: 0 %
Lymphocytes Relative: 17 %
Lymphs Abs: 1.4 10*3/uL (ref 0.7–4.0)
MCH: 30.1 pg (ref 26.0–34.0)
MCHC: 33.2 g/dL (ref 30.0–36.0)
MCV: 90.5 fL (ref 80.0–100.0)
Monocytes Absolute: 0.4 10*3/uL (ref 0.1–1.0)
Monocytes Relative: 5 %
Neutro Abs: 6 10*3/uL (ref 1.7–7.7)
Neutrophils Relative %: 76 %
Platelet Count: 272 10*3/uL (ref 150–400)
RBC: 4.09 MIL/uL (ref 3.87–5.11)
RDW: 13.4 % (ref 11.5–15.5)
WBC Count: 8 10*3/uL (ref 4.0–10.5)
nRBC: 0 % (ref 0.0–0.2)

## 2021-07-30 LAB — CMP (CANCER CENTER ONLY)
ALT: 15 U/L (ref 0–44)
AST: 11 U/L — ABNORMAL LOW (ref 15–41)
Albumin: 4.1 g/dL (ref 3.5–5.0)
Alkaline Phosphatase: 52 U/L (ref 38–126)
Anion gap: 6 (ref 5–15)
BUN: 13 mg/dL (ref 6–20)
CO2: 27 mmol/L (ref 22–32)
Calcium: 9.3 mg/dL (ref 8.9–10.3)
Chloride: 109 mmol/L (ref 98–111)
Creatinine: 0.75 mg/dL (ref 0.44–1.00)
GFR, Estimated: >60 mL/min (ref >60)
Glucose, Bld: 115 mg/dL — ABNORMAL HIGH (ref 70–99)
Potassium: 3.7 mmol/L (ref 3.5–5.1)
Sodium: 142 mmol/L (ref 135–145)
Total Bilirubin: 0.4 mg/dL (ref 0.3–1.2)
Total Protein: 6.8 g/dL (ref 6.5–8.1)

## 2021-07-30 MED ORDER — SODIUM CHLORIDE 0.9% FLUSH
10.0000 mL | INTRAVENOUS | Status: DC | PRN
  Administered 2021-07-30: 10 mL

## 2021-07-30 MED ORDER — SODIUM CHLORIDE 0.9 % IV SOLN: Freq: Once | INTRAVENOUS | Status: AC

## 2021-07-30 MED ORDER — SODIUM CHLORIDE 0.9 % IV SOLN
420.0000 mg | Freq: Once | INTRAVENOUS | Status: AC
  Administered 2021-07-30: 420 mg via INTRAVENOUS
  Filled 2021-07-30: qty 14

## 2021-07-30 MED ORDER — HEPARIN SOD (PORK) LOCK FLUSH 100 UNIT/ML IV SOLN
500.0000 [IU] | Freq: Once | INTRAVENOUS | Status: AC | PRN
  Administered 2021-07-30: 500 [IU]

## 2021-07-30 MED ORDER — AMOXICILLIN 500 MG PO TABS: 500.0000 mg | ORAL_TABLET | Freq: Two times a day (BID) | ORAL | 0 refills | Status: DC

## 2021-07-30 MED ORDER — SODIUM CHLORIDE 0.9% FLUSH
10.0000 mL | Freq: Once | INTRAVENOUS | Status: AC
  Administered 2021-07-30: 10 mL

## 2021-07-30 MED ORDER — ACETAMINOPHEN 325 MG PO TABS
650.0000 mg | ORAL_TABLET | Freq: Once | ORAL | Status: AC
  Administered 2021-07-30: 650 mg via ORAL
  Filled 2021-07-30: qty 2

## 2021-07-30 MED ORDER — TRASTUZUMAB-DKST CHEMO 150 MG IV SOLR
6.0000 mg/kg | Freq: Once | INTRAVENOUS | Status: AC
  Administered 2021-07-30: 378 mg via INTRAVENOUS
  Filled 2021-07-30: qty 18

## 2021-07-30 MED ORDER — ZOLEDRONIC ACID 4 MG/100ML IV SOLN
4.0000 mg | Freq: Once | INTRAVENOUS | Status: AC
  Administered 2021-07-30: 4 mg via INTRAVENOUS
  Filled 2021-07-30: qty 100

## 2021-07-30 MED ORDER — DIPHENHYDRAMINE HCL 25 MG PO CAPS
25.0000 mg | ORAL_CAPSULE | Freq: Once | ORAL | Status: AC
  Administered 2021-07-30: 25 mg via ORAL
  Filled 2021-07-30: qty 1

## 2021-07-30 NOTE — Patient Instructions (Signed)
[facility]  Discharge Instructions: Thank you for choosing [facility] to provide your oncology and hematology care.   If you have a lab appointment with the [facility], please go directly to the [facility] and check in at the registration area.   Wear comfortable clothing and clothing appropriate for easy access to any Portacath or PICC line.   We strive to give you quality time with your provider. You may need to reschedule your appointment if you arrive late (15 or more minutes).  Arriving late affects you and other patients whose appointments are after yours.  Also, if you miss three or more appointments without notifying the office, you may be dismissed from the clinic at the provider's discretion.      For prescription refill requests, have your pharmacy contact our office and allow 72 hours for refills to be completed.    Today you received the following chemotherapy and/or immunotherapy agents: Trastuzumab, Pertuzumab, Zometa   To help prevent nausea and vomiting after your treatment, we encourage you to take your nausea medication as directed.  BELOW ARE SYMPTOMS THAT SHOULD BE REPORTED IMMEDIATELY: *FEVER GREATER THAN 100.4 F (38 C) OR HIGHER *CHILLS OR SWEATING *NAUSEA AND VOMITING THAT IS NOT CONTROLLED WITH YOUR NAUSEA MEDICATION *UNUSUAL SHORTNESS OF BREATH *UNUSUAL BRUISING OR BLEEDING *URINARY PROBLEMS (pain or burning when urinating, or frequent urination) *BOWEL PROBLEMS (unusual diarrhea, constipation, pain near the anus) TENDERNESS IN MOUTH AND THROAT WITH OR WITHOUT PRESENCE OF ULCERS (sore throat, sores in mouth, or a toothache) UNUSUAL RASH, SWELLING OR PAIN  UNUSUAL VAGINAL DISCHARGE OR ITCHING   Items with * indicate a potential emergency and should be followed up as soon as possible or go to the Emergency Department if any problems should occur.  Please show the CHEMOTHERAPY ALERT CARD or IMMUNOTHERAPY ALERT  CARD at check-in to the Emergency Department and triage nurse.  Should you have questions after your visit or need to cancel or reschedule your appointment, please contact [facility]  Dept: [phone]  and follow the prompts.  Office hours are 8:00 a.m. to 4:30 p.m. Monday - Friday. Please note that voicemails left after 4:00 p.m. may not be returned until the following business day.  We are closed weekends and major holidays. You have access to a nurse at all times for urgent questions. Please call the main number to the clinic Dept: [phone] and follow the prompts.   For any non-urgent questions, you may also contact your provider using MyChart. We now offer e-Visits for anyone 76 and older to request care online for non-urgent symptoms. For details visit [email or web address].   Also download the MyChart app! Go to the app store, search "MyChart", open the app, select [facility], and log in with your MyChart username and password.  Due to Covid, a mask is required upon entering the hospital/clinic. If you do not have a mask, one will be given to you upon arrival. For doctor visits, patients may have 1 support person aged 30 or older with them. For treatment visits, patients cannot have anyone with them due to current Covid guidelines and our immunocompromised population.

## 2021-07-30 NOTE — Progress Notes (Signed)
[place] [facility] Follow up:    Pcp, No No address on file   DIAGNOSIS:  Cancer Staging  Carcinoma of breast metastatic to bone [facility]) Staging form: Breast, AJCC 8th Edition - Clinical stage from 09/03/2020: Stage IV (cT4b, cN1, cM1, G2, ER+, PR+, HER2+) - Signed by Gardenia Phlegm, NP on 10/03/2020 Stage prefix: Initial diagnosis Method of lymph node assessment: Clinical Histologic grading system: 3 grade system   SUMMARY OF ONCOLOGIC HISTORY: Oncology History  Primary malignant neoplasm of breast with metastasis ([place])  08/15/2020 Imaging   Large mass in the medial left breast measuring 3.5 cm.  Prominent left axillary lymph node cluster of adjacent lymph nodes, irregular nodule right middle lobe 1.1 cm, aggressive lesion right second rib cortical destruction, multiple lucent lesions throughout the thoracic spine including T1 vertebral body and T2, irregular multilobar lesion central left hepatic lobe 4.3 x 4.7 cm.  Several satellite lesions in the left lateral hepatic lobe.  Multiple sclerotic lesions in the bones iliac wings, acetabular, medial iliac bones, sacrum multiple lesions lumbar spine L3-L4 and L5   08/17/2020 Initial Diagnosis   Biopsy revealed IDC with DCIS grade 2, ER 30%, PR 20%, HER2 equivocal by IHC, FISH positive, Ki-67 25%, lymph node positive    Genetic Testing   Negative genetic testing:  No pathogenic variants detected on the [employer] CancerNext-Expanded + RNAinsight panel. The report date is 09/06/2020.   The CancerNext-Expanded + RNAinsight gene panel offered by [employer] and includes sequencing and rearrangement analysis for the following 77 genes: AIP, ALK, APC, ATM, AXIN2, BAP1, BARD1, BLM, BMPR1A, BRCA1, BRCA2, BRIP1, CDC73, CDH1, CDK4, CDKN1B, CDKN2A, CHEK2, CTNNA1, DICER1, FANCC, FH, FLCN, GALNT12, KIF1B, LZTR1, MAX, MEN1, MET, MLH1, MSH2, MSH3, MSH6, MUTYH, NBN, NF1, NF2, NTHL1, PALB2, PHOX2B, PMS2, POT1, PRKAR1A, PTCH1, PTEN, RAD51C,  RAD51D, RB1, RECQL, RET, SDHA, SDHAF2, SDHB, SDHC, SDHD, SMAD4, SMARCA4, SMARCB1, SMARCE1, STK11, SUFU, TMEM127, TP53, TSC1, TSC2, VHL and XRCC2 (sequencing and deletion/duplication); EGFR, EGLN1, HOXB13, KIT, MITF, PDGFRA, POLD1 and POLE (sequencing only); EPCAM and GREM1 (deletion/duplication only). RNA data is routinely analyzed for use in variant interpretation for all genes.   Carcinoma of breast metastatic to bone ([place])  08/17/2020 Initial Diagnosis   Carcinoma of breast metastatic to bone [facility]); Goserelin/Zoladex started and to be given every 4 weeks    Genetic Testing   Negative genetic testing:  No pathogenic variants detected on the [employer] CancerNext-Expanded + RNAinsight panel. The report date is 09/06/2020.   The CancerNext-Expanded + RNAinsight gene panel offered by [employer] and includes sequencing and rearrangement analysis for the following 77 genes: AIP, ALK, APC, ATM, AXIN2, BAP1, BARD1, BLM, BMPR1A, BRCA1, BRCA2, BRIP1, CDC73, CDH1, CDK4, CDKN1B, CDKN2A, CHEK2, CTNNA1, DICER1, FANCC, FH, FLCN, GALNT12, KIF1B, LZTR1, MAX, MEN1, MET, MLH1, MSH2, MSH3, MSH6, MUTYH, NBN, NF1, NF2, NTHL1, PALB2, PHOX2B, PMS2, POT1, PRKAR1A, PTCH1, PTEN, RAD51C, RAD51D, RB1, RECQL, RET, SDHA, SDHAF2, SDHB, SDHC, SDHD, SMAD4, SMARCA4, SMARCB1, SMARCE1, STK11, SUFU, TMEM127, TP53, TSC1, TSC2, VHL and XRCC2 (sequencing and deletion/duplication); EGFR, EGLN1, HOXB13, KIT, MITF, PDGFRA, POLD1 and POLE (sequencing only); EPCAM and GREM1 (deletion/duplication only). RNA data is routinely analyzed for use in variant interpretation for all genes.   08/18/2020 - 08/30/2020 Radiation Therapy   Palliative radiation to the lumbar and pelvic bone; 30 Gy in 10 fractions   08/31/2020 -  Adjuvant Chemotherapy   Started with Herceptin/Perjeta on 08/31/2020 and Taxotere on 09/04/2020; she was hospitalized with dehydration and refractory diarrhea for 11 days, she  resumed treatment with Taxotere (dose reduced by 20m/m2) and  Herceptin only (perjeta discontinued) on 09/26/2020, reintroduced 10/17/2020   09/03/2020 Cancer Staging   Staging form: Breast, AJCC 8th Edition - Clinical stage from 09/03/2020: Stage IV (cT4b, cN1, cM1, G2, ER+, PR+, HER2+) - Signed by CGardenia Phlegm NP on 10/03/2020 Stage prefix: Initial diagnosis Method of lymph node assessment: Clinical Histologic grading system: 3 grade system     CURRENT THERAPY: Herceptin Perjeta/anastrozole  INTERVAL HISTORY: Ellen REIMANN89y.o. female returns for urgent follow-up while receiving Herceptin and Perjeta infusion due to lymph node that she feels is more prominent in her left axilla.  She notes that this happened after she was on the beach and shoveling sand with both of her arms in 2 oh pile.  She has tenderness in that lymph node.  She has not had any fevers or chills or recent infections.  Ellen [name] underwent bilateral salpingo-oophorectomy on Jul 16, 2021.  Pathology was benign from this surgery.  Patient Active Problem List   Diagnosis Date Noted   Port-A-Cath in place 06/03/2021   AK[place]acute kidney [place]) [place] 09/09/2020   Genetic testing 09/06/2020   Family history of thyroid cancer 08/29/2020   Family history of pancreatic cancer 08/29/2020   Carcinoma of breast metastatic to bone ([place] 08/17/2020   [facility] [facility] with [facility] [place] [name] 08/16/2020   Left breast mass 08/13/2020   Genital herpes 08/13/2020   Attention deficit hyperactivity disorder (ADHD), 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   GAD (generalized anxiety disorder) 02/16/2017   Marijuana user 02/16/2017   Mild intermittent asthma without complication 182/70/7867  Severe episode of recurrent major depressive disorder, without psychotic features ([facility] 02/16/2017   Depression 04/20/2012    is allergic to other.  MEDICAL HISTORY: Past Medical History:  Diagnosis Date   ADHD (attention deficit hyperactivity disorder)    Anemia     Anxiety    Asthma    Exercise Induced   Cancer ([place]    Depression    Family history of pancreatic cancer    Family history of thyroid cancer    GERD (gastroesophageal reflux disease)    Herpes simplex    Personal history of chemotherapy 08/2020    SURGICAL HISTORY: Past Surgical History:  Procedure Laterality Date   BREAST BIOPSY Left 08/2020   PORTACATH PLACEMENT Right 08/23/2020   Procedure: INSERTION PORT-A-CATH;  Surgeon: TDonnie Mesa MD;  [facility]  Service: General;  Laterality: Right;   ROBOTIC ASSISTED BILATERAL SALPINGO OOPHERECTOMY Bilateral 07/16/2021   Procedure: XI ROBOTIC ASSISTED BILATERAL SALPINGO OOPHORECTOMY;  Surgeon: TLafonda Mosses MD;  Location: [facility];  Service: Gynecology;  Laterality: Bilateral;    SOCIAL HISTORY: Social History   Socioeconomic History   Marital status: Single    Spouse name: Not on file   Number of children: Not on file   Years of education: Not on file   Highest education level: Not on file  Occupational History   Not on file  Tobacco Use   Smoking status: Never   Smokeless tobacco: Never  Vaping Use   Vaping Use: Every day   Substances: THC  Substance and Sexual Activity   Alcohol use: Not Currently    Comment: 3 to 4 a week   Drug use: Yes    Types: Marijuana    Comment: Daily   Sexual activity: Yes  Other Topics Concern   Not on file  Social History Narrative   Menarche  at age 32, has a cycles every 28 days that lasts about 11 days, 4 days of spotting, two days of heavy flow, and the other days mild to moderate flow.  Received Depo provera shot x 3 years, Nexplanon x 2 years, and currently has kyleena IUD.  No OCPs.  She has never been pregnant.    Social Determinants of Health   Financial Resource Strain: High Risk (05/08/2021)   Overall Financial Resource Strain (CARDIA)    Difficulty of Paying Living Expenses: Very hard  Food Insecurity: No Food Insecurity (08/13/2020)   Hunger  Vital Sign    Worried About Running Out of Food in the Last Year: Never true    Ran Out of Food in the Last Year: Never true  Transportation Needs: No Transportation Needs (08/13/2020)   PRAPARE - Hydrologist (Medical): No    Lack of Transportation (Non-Medical): No  Physical Activity: Not on file  Stress: Not on file  Social Connections: Not on file  Intimate Partner Violence: Not on file    FAMILY HISTORY: Family History  Problem Relation Age of Onset   Depression Mother    Hyperlipidemia Mother    Thyroid cancer Mother 54       papillary thyroid cancer   Hypertension Father    Atrial fibrillation Father    Irritable bowel syndrome Father        also brother   ADD / ADHD Brother    Bipolar disorder Brother    Diabetes Maternal Grandfather    Heart attack Paternal Great-grandfather 18   Pancreatic cancer Maternal Great-grandfather 88       (MGM's father)   Colon cancer Neg Hx    Breast cancer Neg Hx    Ovarian cancer Neg Hx    Endometrial cancer Neg Hx    Prostate cancer Neg Hx     Review of Systems  Constitutional:  Negative for appetite change, chills, fatigue, fever and unexpected weight change.  HENT:   Negative for hearing loss, lump/mass and trouble swallowing.   Eyes:  Negative for eye problems and icterus.  Respiratory:  Negative for chest tightness, cough and shortness of breath.   Cardiovascular:  Negative for chest pain, leg swelling and palpitations.  Gastrointestinal:  Negative for abdominal distention, abdominal pain, constipation, diarrhea, nausea and vomiting.  Endocrine: Negative for hot flashes.  Genitourinary:  Negative for difficulty urinating.   Musculoskeletal:  Negative for arthralgias.  Skin:  Negative for itching and rash.  Neurological:  Negative for dizziness, extremity weakness, headaches and numbness.  Hematological:  Negative for adenopathy. Does not bruise/bleed easily.  Psychiatric/Behavioral:  Negative for  depression. The patient is not nervous/anxious.       PHYSICAL EXAMINATION  ECOG PERFORMANCE STATUS: 1 - Symptomatic but completely ambulatory  There were no vitals filed for this visit.  Physical Exam Constitutional:      General: She is not in acute distress.    Appearance: Normal appearance. She is not toxic-appearing.  HENT:     Head: Normocephalic and atraumatic.  Eyes:     General: No scleral icterus. Cardiovascular:     Rate and Rhythm: Normal rate and regular rhythm.     Pulses: Normal pulses.     Heart sounds: Normal heart sounds.  Pulmonary:     Effort: Pulmonary effort is normal.     Breath sounds: Normal breath sounds.  Abdominal:     General: Abdomen is flat. Bowel sounds are normal. There  is no distension.     Palpations: Abdomen is soft.     Tenderness: There is no abdominal tenderness.  Musculoskeletal:        General: No swelling.     Cervical back: Neck supple.  Lymphadenopathy:     Cervical: No cervical adenopathy.     Upper Body:     Left upper body: Axillary adenopathy (Left axillary lymph node palpable and tender to palpation approximately 1 cm in size) present.  Skin:    General: Skin is warm and dry.     Findings: No rash.  Neurological:     General: No focal deficit present.     Mental Status: She is alert.  Psychiatric:        Mood and Affect: Mood normal.        Behavior: Behavior normal.     LABORATORY DATA:  CBC    Component Value Date/Time   WBC 8.0 07/30/2021 1007   WBC 7.2 07/09/2021 1133   RBC 4.09 07/30/2021 1007   HGB 12.3 07/30/2021 1007   HCT 37.0 07/30/2021 1007   PLT 272 07/30/2021 1007   MCV 90.5 07/30/2021 1007   MCH 30.1 07/30/2021 1007   MCHC 33.2 07/30/2021 1007   RDW 13.4 07/30/2021 1007   LYMPHSABS 1.4 07/30/2021 1007   MONOABS 0.4 07/30/2021 1007   EOSABS 0.2 07/30/2021 1007   BASOSABS 0.0 07/30/2021 1007    CMP     Component Value Date/Time   NA 142 07/30/2021 1007   K 3.7 07/30/2021 1007   CL  109 07/30/2021 1007   CO2 27 07/30/2021 1007   GLUCOSE 115 (Ellen) 07/30/2021 1007   BUN 13 07/30/2021 1007   CREATININE 0.75 07/30/2021 1007   CALCIUM 9.3 07/30/2021 1007   PROT 6.8 07/30/2021 1007   ALBUMIN 4.1 07/30/2021 1007   AST 11 (L) 07/30/2021 1007   ALT 15 07/30/2021 1007   ALKPHOS 52 07/30/2021 1007   BILITOT 0.4 07/30/2021 1007   GFRNONAA >60 07/30/2021 1007     ASSESSMENT and THERAPY PLAN:   Carcinoma of breast metastatic to bone ([place]) 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, FISH positive ratio 2.8, Ki-67 25%, lymph node positive   Palliative radiation completed 09/03/2020 Taxotere/Herceptin/Perjeta 08/31/2020-12/18/2020 Herceptin Perjeta Maintenance  beginning 01/08/2021   PET/CT 07/07/2021: Interval decrease in the left breast mass having an SUV of 2.7, small nodule left breast SUV 5.6, left axillary lymph node SUV 3.35, liver metastases no longer visible.  Sclerosis involving bone metastases.  New compression fracture L3 vertebral spine no sclerotic   Treatment plan:  Herceptin Perjeta maintenance  ------------------------------------------------------------------------------------------------------------------------------------ Current treatment:  Herceptin Perjeta, s/p BSO, and anastrozole  Because Ellen [name] has tender lymphadenopathy I am going to treat her like she has lymphadenitis and we will send her in some antibiotics to take.  She will let us know how this goes and we will reevaluate her at the time of her next treatment to determine if worse and if any additional imaging is indicated.  She will continue on anastrozole and with Herceptin and Perjeta given every 3 weeks.       All questions were answered. The patient knows to call the clinic with any problems, questions or concerns. We  can certainly see the patient much sooner if necessary.  Total encounter time:20 minutes*in  face-to-face visit time, chart review, lab review, care coordination, order entry, and documentation of the encounter time.    Wilber Bihari, NP 07/30/21 3:21 PM Medical Oncology and Hematology [facility] [place], [place] [address] Tel. [phone]    Fax. [phone]  *Total Encounter Time as defined by the Centers for Medicare and Medicaid Services includes, in addition to the face-to-face time of a patient visit (documented in the note above) non-face-to-face time: obtaining and reviewing outside history, ordering and reviewing medications, tests or procedures, care coordination (communications with other health care professionals or caregivers) and documentation in the medical record.

## 2021-07-30 NOTE — Assessment & Plan Note (Signed)
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, ER 30%, PR 20%, HER2 equivocal by IHC, FISH positiveratio 2.8, Ki-67 25%, lymph node positive  Palliative radiation completed 09/03/2020 Taxotere/Herceptin/Perjeta 08/31/2020-12/18/2020 Herceptin Perjeta Maintenance beginning 01/08/2021   PET/CT 07/07/2021: Interval decrease in the left breast mass having an SUV of 2.7, small nodule left breast SUV 5.6, left axillary lymph node SUV 3.35, liver metastases no longer visible.  Sclerosis involving bone metastases.  New compression fracture L3 vertebral spine no sclerotic  Treatment plan: Herceptin Perjeta maintenance  ------------------------------------------------------------------------------------------------------------------------------------ Current treatment: Herceptin Perjeta, s/p BSO, and anastrozole  Because Ellen [name] has tender lymphadenopathy I am going to treat her like she has lymphadenitis and we will send her in some antibiotics to take.  She will let us know how this goes and we will reevaluate her at the time of her next treatment to determine if worse and if any additional imaging is indicated.  She will continue on anastrozole and with Herceptin and Perjeta given every 3 weeks.

## 2021-07-31 ENCOUNTER — Encounter: Payer: Self-pay | Admitting: Hematology and Oncology

## 2021-07-31 ENCOUNTER — Ambulatory Visit: Payer: Medicaid Other

## 2021-07-31 ENCOUNTER — Other Ambulatory Visit: Payer: Medicaid Other

## 2021-07-31 NOTE — Telephone Encounter (Signed)
Spoke with Ellen [name] this morning regarding her incisions. Joylene John, NP and Dr. Berline Lopes reviewed the images. Incisions do not look infected. The incision with the scab may have opened superficially but it is healing. Advised patient that she can put a swipe of neosporin on it and cover with a bandage. Nothing else needs to be done such as suturing or applying dermabond as the wound is too healed. Patient verbalized understanding. Instructed to call with any questions or concerns.

## 2021-08-05 ENCOUNTER — Encounter: Payer: Self-pay | Admitting: Gynecologic Oncology

## 2021-08-05 ENCOUNTER — Other Ambulatory Visit: Payer: Self-pay

## 2021-08-05 ENCOUNTER — Inpatient Hospital Stay (HOSPITAL_BASED_OUTPATIENT_CLINIC_OR_DEPARTMENT_OTHER): Payer: Medicaid Other | Admitting: Gynecologic Oncology

## 2021-08-05 ENCOUNTER — Encounter: Payer: Self-pay | Admitting: Adult Health

## 2021-08-05 VITALS — BP 117/83 | HR 120 | Temp 98.5°F | Resp 16 | Ht 65.0 in | Wt 143.0 lb

## 2021-08-05 DIAGNOSIS — Z90722 Acquired absence of ovaries, bilateral: Secondary | ICD-10-CM

## 2021-08-05 DIAGNOSIS — C50919 Malignant neoplasm of unspecified site of unspecified female breast: Secondary | ICD-10-CM

## 2021-08-05 NOTE — Progress Notes (Signed)
Gynecologic Oncology Return Clinic Visit  08/05/21  Reason for Visit: Follow-up after recent surgery  Treatment History: Oncology History  Primary malignant neoplasm of breast with metastasis ([place])  08/15/2020 Imaging   Large mass in the medial left breast measuring 3.5 cm.  Prominent left axillary lymph node cluster of adjacent lymph nodes, irregular nodule right middle lobe 1.1 cm, aggressive lesion right second rib cortical destruction, multiple lucent lesions throughout the thoracic spine including T1 vertebral body and T2, irregular multilobar lesion central left hepatic lobe 4.3 x 4.7 cm.  Several satellite lesions in the left lateral hepatic lobe.  Multiple sclerotic lesions in the bones iliac wings, acetabular, medial iliac bones, sacrum multiple lesions lumbar spine L3-L4 and L5   08/17/2020 Initial Diagnosis   Biopsy revealed IDC with DCIS grade 2, ER 30%, PR 20%, HER2 equivocal by IHC, FISH positive, Ki-67 25%, lymph node positive    Genetic Testing   Negative genetic testing:  No pathogenic variants detected on the [employer] CancerNext-Expanded + RNAinsight panel. The report date is 09/06/2020.   The CancerNext-Expanded + RNAinsight gene panel offered by [employer] and includes sequencing and rearrangement analysis for the following 77 genes: AIP, ALK, APC, ATM, AXIN2, BAP1, BARD1, BLM, BMPR1A, BRCA1, BRCA2, BRIP1, CDC73, CDH1, CDK4, CDKN1B, CDKN2A, CHEK2, CTNNA1, DICER1, FANCC, FH, FLCN, GALNT12, KIF1B, LZTR1, MAX, MEN1, MET, MLH1, MSH2, MSH3, MSH6, MUTYH, NBN, NF1, NF2, NTHL1, PALB2, PHOX2B, PMS2, POT1, PRKAR1A, PTCH1, PTEN, RAD51C, RAD51D, RB1, RECQL, RET, SDHA, SDHAF2, SDHB, SDHC, SDHD, SMAD4, SMARCA4, SMARCB1, SMARCE1, STK11, SUFU, TMEM127, TP53, TSC1, TSC2, VHL and XRCC2 (sequencing and deletion/duplication); EGFR, EGLN1, HOXB13, KIT, MITF, PDGFRA, POLD1 and POLE (sequencing only); EPCAM and GREM1 (deletion/duplication only). RNA data is routinely analyzed for use in variant  interpretation for all genes.   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 The report date is 09/06/2020.   The CancerNext-Expanded + RNAinsight gene panel offered by [employer] and includes sequencing and rearrangement analysis for the following 77 genes: AIP, ALK, APC, ATM, AXIN2, BAP1, BARD1, BLM, BMPR1A, BRCA1, BRCA2, BRIP1, CDC73, CDH1, CDK4, CDKN1B, CDKN2A, CHEK2, CTNNA1, DICER1, FANCC, FH, FLCN, GALNT12, KIF1B, LZTR1, MAX, MEN1, MET, MLH1, MSH2, MSH3, MSH6, MUTYH, NBN, NF1, NF2, NTHL1, PALB2, PHOX2B, PMS2, POT1, PRKAR1A, PTCH1, PTEN, RAD51C, RAD51D, RB1, RECQL, RET, SDHA, SDHAF2, SDHB, SDHC, SDHD, SMAD4, SMARCA4, SMARCB1, SMARCE1, STK11, SUFU, TMEM127, TP53, TSC1, TSC2, VHL and XRCC2 (sequencing and deletion/duplication); EGFR, EGLN1, HOXB13, KIT, MITF, PDGFRA, POLD1 and POLE (sequencing only); EPCAM and GREM1 (deletion/duplication only). RNA data is routinely analyzed for use in variant interpretation for all genes.   08/18/2020 - 08/30/2020 Radiation Therapy   Palliative radiation to the lumbar and pelvic bone; 30 Gy in 10 fractions   08/31/2020 -  Adjuvant Chemotherapy   Started with Herceptin/Perjeta on 08/31/2020 and Taxotere on 09/04/2020; she was hospitalized with dehydration and refractory diarrhea for 11 days, she resumed treatment with Taxotere (dose reduced by 46m/m2) and Herceptin only (perjeta discontinued) on 09/26/2020, reintroduced 10/17/2020   09/03/2020 Cancer Staging   Staging form: Breast, AJCC 8th Edition - Clinical stage from 09/03/2020: Stage IV (cT4b, cN1, cM1, G2, ER+, PR+, HER2+) - Signed by CGardenia Phlegm NP on 10/03/2020 Stage prefix: Initial diagnosis Method of lymph node assessment:  Clinical Histologic grading system: 3 grade system   07/16/2021 Surgery   S/p BSO, benign pathology     07/16/2021: Robotic assisted BSO  Interval History: Presents today for follow-up after recent surgery.  Doing well.  Denies any significant abdominal or pelvic pain.  Reports regular bowel and bladder function.  Endorses good appetite without nausea or emesis.  2 of her incisions opened slightly, she thinks this is because she wears high waisted pants.  She is using a little bit of Neosporin and gauze to cover them.  Denies any related pain to the incisions.  Past Medical/Surgical History: Past Medical History:  Diagnosis Date   ADHD (attention deficit hyperactivity disorder)    Anemia    Anxiety    Asthma    Exercise Induced   Cancer ([place])    Depression    Family history of pancreatic cancer    Family history of thyroid cancer    GERD (gastroesophageal reflux disease)    Herpes simplex    Personal history of chemotherapy 08/2020    Past Surgical History:  Procedure Laterality Date   BREAST BIOPSY Left 08/2020   PORTACATH PLACEMENT Right 08/23/2020   Procedure: INSERTION PORT-A-CATH;  Surgeon: [name], MD;  Location: [facility];  Service: General;  Laterality: Right;   ROBOTIC ASSISTED BILATERAL SALPINGO OOPHERECTOMY Bilateral 07/16/2021   Procedure: XI ROBOTIC ASSISTED BILATERAL SALPINGO OOPHORECTOMY;  Surgeon: Lafonda Mosses, MD;  Location: [facility];  Service: Gynecology;  Laterality: Bilateral;    Family History  Problem Relation Age of Onset   Depression Mother    Hyperlipidemia Mother    Thyroid cancer Mother 21       papillary thyroid cancer   Hypertension Father    Atrial fibrillation Father    Irritable bowel syndrome Father        also brother   ADD / ADHD Brother    Bipolar disorder Brother    Diabetes Maternal Grandfather    Heart attack Paternal Great-grandfather 59   Pancreatic cancer Maternal Great-grandfather 88       (MGM's  father)   Colon cancer Neg Hx    Breast cancer Neg Hx    Ovarian cancer Neg Hx    Endometrial cancer Neg Hx    Prostate cancer Neg Hx     Social History   Socioeconomic History   Marital status: Single    Spouse name: Not on file   Number of children: Not on file   Years of education: Not on file   Highest education level: Not on file  Occupational History   Not on file  Tobacco Use   Smoking status: Never   Smokeless tobacco: Never  Vaping Use   Vaping Use: Every day   Substances: THC  Substance and Sexual Activity   Alcohol use: Not Currently    Comment: 3 to 4 a week   Drug use: Yes    Types: Marijuana    Comment: Daily   Sexual activity: Yes  Other Topics Concern   Not on file  Social History Narrative   Menarche at age 67, has a cycles every 28 days that lasts about 11 days, 4 days of spotting, two days of heavy flow, and the other days mild to moderate flow.  Received Depo provera shot x 3 years, Nexplanon x 2 years, and currently has kyleena IUD.  No OCPs.  She has never been pregnant.    Social Determinants of Health   Financial Resource Strain:  High Risk (05/08/2021)   Overall Financial Resource Strain (CARDIA)    Difficulty of Paying Living Expenses: Very hard  Food Insecurity: No Food Insecurity (08/13/2020)   Hunger Vital Sign    Worried About Running Out of Food in the Last Year: Never true    Ran Out of Food in the Last Year: Never true  Transportation Needs: No Transportation Needs (08/13/2020)   PRAPARE - Hydrologist (Medical): No    Lack of Transportation (Non-Medical): No  Physical Activity: Not on file  Stress: Not on file  Social Connections: Not on file    Current Medications:  Current Outpatient Medications:    acyclovir (ZOVIRAX) 800 MG tablet, Take 1 tablet (800 mg total) by mouth 2 (two) times daily as needed (flare up)., Disp: 60 tablet, Rfl: 5   acyclovir ointment (ZOVIRAX) 5 %, Apply 1 application.  topically 4 (four) times daily as needed (outbreak)., Disp: , Rfl:    amoxicillin (AMOXIL) 500 MG tablet, Take 1 tablet (500 mg total) by mouth 2 (two) times daily., Disp: 10 tablet, Rfl: 0   anastrozole (ARIMIDEX) 1 MG tablet, Take 1 tablet (1 mg total) by mouth daily., Disp: 90 tablet, Rfl: 3   ibuprofen (ADVIL) 800 MG tablet, Take 1 tablet (800 mg total) by mouth every 8 (eight) hours as needed for moderate pain. For AFTER surgery only, Disp: 30 tablet, Rfl: 0   lidocaine-prilocaine (EMLA) cream, Apply to affected area once (Patient taking differently: Apply 1 application. topically daily as needed (prior to port access).), Disp: 30 g, Rfl: 3   LORazepam (ATIVAN) 0.5 MG tablet, Take 1 tablet (0.5 mg total) by mouth at bedtime as needed for sleep., Disp: 30 tablet, Rfl: 3   magnesium oxide (MAG-OX) 400 (240 Mg) MG tablet, Take 1 tablet (400 mg total) by mouth daily., Disp: 30 tablet, Rfl: 3   ondansetron (ZOFRAN) 8 MG tablet, Take 1 tablet (8 mg total) by mouth 2 (two) times daily as needed for refractory nausea / vomiting., Disp: 30 tablet, Rfl: 1   oxyCODONE (OXY IR/ROXICODONE) 5 MG immediate release tablet, Take 1 tablet (5 mg total) by mouth every 4 (four) hours as needed for severe pain. For AFTER surgery only, do not take and drive, Disp: 15 tablet, Rfl: 0   prochlorperazine (COMPAZINE) 10 MG tablet, Take 1 tablet (10 mg total) by mouth every 6 (six) hours as needed for nausea or vomiting., Disp: 30 tablet, Rfl: 1   senna-docusate (SENOKOT-S) 8.6-50 MG tablet, Take 2 tablets by mouth at bedtime. For AFTER surgery, do not take if having diarrhea, Disp: 30 tablet, Rfl: 0   venlafaxine XR (EFFEXOR-XR) 150 MG 24 hr capsule, Take 1 capsule (150 mg total) by mouth daily with breakfast., Disp: 30 capsule, Rfl: 6   VYVANSE 50 MG capsule, Take 50 mg by mouth daily., Disp: , Rfl:   Review of Systems: Denies appetite changes, fevers, chills, fatigue, unexplained weight changes. Denies hearing loss, neck  lumps or masses, mouth sores, ringing in ears or voice changes. Denies cough or wheezing.  Denies shortness of breath. Denies chest pain or palpitations. Denies leg swelling. Denies abdominal distention, pain, blood in stools, constipation, diarrhea, nausea, vomiting, or early satiety. Denies pain with intercourse, dysuria, frequency, hematuria or incontinence. Denies hot flashes, pelvic pain, vaginal bleeding or vaginal discharge.   Denies joint pain, back pain or muscle pain/cramps. Denies itching, rash, or wounds. Denies dizziness, headaches, numbness or seizures. Denies swollen lymph nodes or  glands, denies easy bruising or bleeding. Denies anxiety, depression, confusion, or decreased concentration.  Physical Exam: BP 117/83 (BP Location: Left Arm, Patient Position: Sitting)   Pulse (!) 120   Temp 98.5 F (36.9 C) (Oral)   Resp 16   Ht 5' 5"  (1.651 m)   Wt 143 lb (64.9 kg)   SpO2 98%   BMI 23.80 kg/m  General: Alert, oriented, no acute distress. HEENT: Normocephalic, atraumatic, sclera anicteric. Chest: Unlabored breathing on room air. Abdomen: soft, nontender.  Normoactive bowel sounds.  No masses or hepatosplenomegaly appreciated.  Well-healed incisions.  Right and left lateral incision slightly open, no evidence of induration, erythema, or exudate.  Infraumbilical and left upper quadrant incision healing well, closed. Extremities: Grossly normal range of motion.  Warm, well perfused.  No edema bilaterally.  Laboratory & Radiologic Studies: A. OVARIES AND FALLOPIAN TUBES, BILATERAL, SALPINGO OOPHORECTOMY:  - Benign bilateral ovaries and fallopian tubes  - No evidence of malignancy  - See comment  Assessment & Plan: Ellen [name] is a 26 y.o. woman with metastatic estrogen receptor positive breast cancer presenting today for follow-up after recent therapeutic BSO.  Patient is doing well and meeting postoperative milestones.  Discussed continued expectations and  postoperative restrictions.  I am releasing her back to her medical oncology team.  12 minutes of total time was spent for this patient encounter, including preparation, face-to-face counseling with the patient and coordination of care, and documentation of the encounter.  Jeral Pinch, MD  Division of Gynecologic Oncology  Department of Obstetrics and Gynecology  [facility]

## 2021-08-05 NOTE — Patient Instructions (Signed)
It was good to see you today.  You are healing well from surgery.  Please remember, no heavy lifting for 6 weeks after surgery.  Please not hesitate to call the office if you need anything.

## 2021-08-08 ENCOUNTER — Other Ambulatory Visit: Payer: Self-pay | Admitting: Nurse Practitioner

## 2021-08-08 ENCOUNTER — Telehealth: Payer: Self-pay

## 2021-08-08 NOTE — Telephone Encounter (Signed)
See other note

## 2021-08-12 ENCOUNTER — Encounter: Payer: Self-pay | Admitting: General Practice

## 2021-08-19 ENCOUNTER — Encounter: Payer: Self-pay | Admitting: Hematology and Oncology

## 2021-08-19 ENCOUNTER — Other Ambulatory Visit (HOSPITAL_COMMUNITY): Payer: Self-pay

## 2021-08-20 ENCOUNTER — Encounter: Payer: Self-pay | Admitting: Hematology and Oncology

## 2021-08-26 ENCOUNTER — Other Ambulatory Visit: Payer: Self-pay | Admitting: *Deleted

## 2021-08-26 DIAGNOSIS — C50912 Malignant neoplasm of unspecified site of left female breast: Secondary | ICD-10-CM

## 2021-08-27 ENCOUNTER — Other Ambulatory Visit: Payer: Self-pay

## 2021-08-27 ENCOUNTER — Inpatient Hospital Stay: Payer: Medicaid Other | Attending: Adult Health

## 2021-08-27 ENCOUNTER — Encounter: Payer: Self-pay | Admitting: General Practice

## 2021-08-27 ENCOUNTER — Inpatient Hospital Stay: Payer: Medicaid Other

## 2021-08-27 ENCOUNTER — Inpatient Hospital Stay (HOSPITAL_BASED_OUTPATIENT_CLINIC_OR_DEPARTMENT_OTHER): Payer: Medicaid Other | Admitting: Hematology and Oncology

## 2021-08-27 DIAGNOSIS — C50912 Malignant neoplasm of unspecified site of left female breast: Secondary | ICD-10-CM | POA: Diagnosis not present

## 2021-08-27 DIAGNOSIS — C50919 Malignant neoplasm of unspecified site of unspecified female breast: Secondary | ICD-10-CM

## 2021-08-27 DIAGNOSIS — Z5112 Encounter for antineoplastic immunotherapy: Secondary | ICD-10-CM | POA: Diagnosis present

## 2021-08-27 DIAGNOSIS — C7951 Secondary malignant neoplasm of bone: Secondary | ICD-10-CM

## 2021-08-27 DIAGNOSIS — Z5111 Encounter for antineoplastic chemotherapy: Secondary | ICD-10-CM | POA: Insufficient documentation

## 2021-08-27 DIAGNOSIS — R911 Solitary pulmonary nodule: Secondary | ICD-10-CM | POA: Insufficient documentation

## 2021-08-27 DIAGNOSIS — Z90722 Acquired absence of ovaries, bilateral: Secondary | ICD-10-CM | POA: Insufficient documentation

## 2021-08-27 DIAGNOSIS — Z95828 Presence of other vascular implants and grafts: Secondary | ICD-10-CM

## 2021-08-27 DIAGNOSIS — Z170 Estrogen receptor positive status [ER+]: Secondary | ICD-10-CM | POA: Insufficient documentation

## 2021-08-27 DIAGNOSIS — C787 Secondary malignant neoplasm of liver and intrahepatic bile duct: Secondary | ICD-10-CM | POA: Insufficient documentation

## 2021-08-27 LAB — CBC WITH DIFFERENTIAL (CANCER CENTER ONLY)
Abs Immature Granulocytes: 0.02 10*3/uL (ref 0.00–0.07)
Basophils Absolute: 0 10*3/uL (ref 0.0–0.1)
Basophils Relative: 0 %
Eosinophils Absolute: 0.1 10*3/uL (ref 0.0–0.5)
Eosinophils Relative: 2 %
HCT: 36.7 % (ref 36.0–46.0)
Hemoglobin: 12.5 g/dL (ref 12.0–15.0)
Immature Granulocytes: 0 %
Lymphocytes Relative: 22 %
Lymphs Abs: 1.6 10*3/uL (ref 0.7–4.0)
MCH: 30.1 pg (ref 26.0–34.0)
MCHC: 34.1 g/dL (ref 30.0–36.0)
MCV: 88.4 fL (ref 80.0–100.0)
Monocytes Absolute: 0.4 10*3/uL (ref 0.1–1.0)
Monocytes Relative: 6 %
Neutro Abs: 5.1 10*3/uL (ref 1.7–7.7)
Neutrophils Relative %: 70 %
Platelet Count: 276 10*3/uL (ref 150–400)
RBC: 4.15 MIL/uL (ref 3.87–5.11)
RDW: 13 % (ref 11.5–15.5)
WBC Count: 7.2 10*3/uL (ref 4.0–10.5)
nRBC: 0 % (ref 0.0–0.2)

## 2021-08-27 LAB — CMP (CANCER CENTER ONLY)
ALT: 13 U/L (ref 0–44)
AST: 13 U/L — ABNORMAL LOW (ref 15–41)
Albumin: 4.3 g/dL (ref 3.5–5.0)
Alkaline Phosphatase: 66 U/L (ref 38–126)
Anion gap: 5 (ref 5–15)
BUN: 16 mg/dL (ref 6–20)
CO2: 26 mmol/L (ref 22–32)
Calcium: 9.5 mg/dL (ref 8.9–10.3)
Chloride: 107 mmol/L (ref 98–111)
Creatinine: 0.75 mg/dL (ref 0.44–1.00)
GFR, Estimated: >60 mL/min (ref >60)
Glucose, Bld: 103 mg/dL — ABNORMAL HIGH (ref 70–99)
Potassium: 4.2 mmol/L (ref 3.5–5.1)
Sodium: 138 mmol/L (ref 135–145)
Total Bilirubin: 0.4 mg/dL (ref 0.3–1.2)
Total Protein: 7.1 g/dL (ref 6.5–8.1)

## 2021-08-27 MED ORDER — SODIUM CHLORIDE 0.9 % IV SOLN: Freq: Once | INTRAVENOUS | Status: AC

## 2021-08-27 MED ORDER — SODIUM CHLORIDE 0.9% FLUSH
10.0000 mL | Freq: Once | INTRAVENOUS | Status: AC
  Administered 2021-08-27: 10 mL

## 2021-08-27 MED ORDER — ACETAMINOPHEN 325 MG PO TABS
650.0000 mg | ORAL_TABLET | Freq: Once | ORAL | Status: AC
  Administered 2021-08-27: 650 mg via ORAL
  Filled 2021-08-27: qty 2

## 2021-08-27 MED ORDER — DIPHENHYDRAMINE HCL 25 MG PO CAPS
25.0000 mg | ORAL_CAPSULE | Freq: Once | ORAL | Status: AC
  Administered 2021-08-27: 25 mg via ORAL
  Filled 2021-08-27: qty 1

## 2021-08-27 MED ORDER — SODIUM CHLORIDE 0.9% FLUSH
10.0000 mL | INTRAVENOUS | Status: DC | PRN
  Administered 2021-08-27: 10 mL

## 2021-08-27 MED ORDER — SODIUM CHLORIDE 0.9 % IV SOLN
420.0000 mg | Freq: Once | INTRAVENOUS | Status: AC
  Administered 2021-08-27: 420 mg via INTRAVENOUS
  Filled 2021-08-27: qty 14

## 2021-08-27 MED ORDER — HEPARIN SOD (PORK) LOCK FLUSH 100 UNIT/ML IV SOLN
500.0000 [IU] | Freq: Once | INTRAVENOUS | Status: AC | PRN
  Administered 2021-08-27: 500 [IU]

## 2021-08-27 MED ORDER — TRASTUZUMAB-DKST CHEMO 150 MG IV SOLR
6.0000 mg/kg | Freq: Once | INTRAVENOUS | Status: AC
  Administered 2021-08-27: 378 mg via INTRAVENOUS
  Filled 2021-08-27: qty 18

## 2021-08-27 NOTE — Patient Instructions (Signed)
[facility]COL[facility]  Discharge Instructions: Thank you for choosing [facility] to provide your oncology and hematology care.   If you have a lab appointment with the [facility], please go directly to the [facility] and check in at the registration area.   Wear comfortable clothing and clothing appropriate for easy access to any Portacath or PICC line.   We strive to give you quality time with your provider. You may need to reschedule your appointment if you arrive late (15 or more minutes).  Arriving late affects you and other patients whose appointments are after yours.  Also, if you miss three or more appointments without notifying the office, you may be dismissed from the clinic at the provider's discretion.      For prescription refill requests, have your pharmacy contact our office and allow 72 hours for refills to be completed.    Today you received the following chemotherapy and/or immunotherapy agents: Ogivri, Perjeta      To help prevent nausea and vomiting after your treatment, we encourage you to take your nausea medication as directed.  BELOW ARE SYMPTOMS THAT SHOULD BE REPORTED IMMEDIATELY: *FEVER GREATER THAN 100.4 F (38 C) OR HIGHER *CHILLS OR SWEATING *NAUSEA AND VOMITING THAT IS NOT CONTROLLED WITH YOUR NAUSEA MEDICATION *UNUSUAL SHORTNESS OF BREATH *UNUSUAL BRUISING OR BLEEDING *URINARY PROBLEMS (pain or burning when urinating, or frequent urination) *BOWEL PROBLEMS (unusual diarrhea, constipation, pain near the anus) TENDERNESS IN MOUTH AND THROAT WITH OR WITHOUT PRESENCE OF ULCERS (sore throat, sores in mouth, or a toothache) UNUSUAL RASH, SWELLING OR PAIN  UNUSUAL VAGINAL DISCHARGE OR ITCHING   Items with * indicate a potential emergency and should be followed up as soon as possible or go to the Emergency Department if any problems should occur.  Please show the CHEMOTHERAPY ALERT CARD or IMMUNOTHERAPY ALERT CARD at  check-in to the Emergency Department and triage nurse.  Should you have questions after your visit or need to cancel or reschedule your appointment, please contact [facility]  Dept: [phone]  and follow the prompts.  Office hours are 8:00 a.m. to 4:30 p.m. Monday - Friday. Please note that voicemails left after 4:00 p.m. may not be returned until the following business day.  We are closed weekends and major holidays. You have access to a nurse at all times for urgent questions. Please call the main number to the clinic Dept: [phone] and follow the prompts.   For any non-urgent questions, you may also contact your provider using MyChart. We now offer e-Visits for anyone 68 and older to request care online for non-urgent symptoms. For details visit [email or web address].   Also download the MyChart app! Go to the app store, search "MyChart", open the app, select [facility], and log in with your MyChart username and password.  Masks are optional in the cancer centers. If you would like for your care team to wear a mask while they are taking care of you, please let them know. For doctor visits, patients may have with them one support person who is at least 27 years old. At this time, visitors are not allowed in the infusion area.

## 2021-08-27 NOTE — Progress Notes (Signed)
Patient Care Team: Pcp, No as PCP - General [name], MD as Consulting Physician (Hematology and Oncology) [name], [name], NP as Nurse Practitioner (Hematology and Oncology) [name], MD as Consulting Physician (General Surgery)  DIAGNOSIS:  Encounter Diagnosis  Name Primary?   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  Prominent left axillary lymph node cluster of adjacent lymph nodes, irregular nodule right middle lobe 1.1 cm, aggressive lesion right second rib cortical destruction, multiple lucent lesions throughout the thoracic spine including T1 vertebral body and T2, irregular multilobar lesion central left hepatic lobe 4.3 x 4.7 cm.  Several satellite lesions in the left lateral hepatic lobe.  Multiple sclerotic lesions in the bones iliac wings, acetabular, medial iliac bones, sacrum multiple lesions lumbar spine L3-L4 and L5   08/17/2020 Initial Diagnosis   Biopsy revealed IDC with DCIS grade 2, ER 30%, PR 20%, HER2 equivocal by IHC, FISH positive, Ki-67 25%, lymph node positive    Genetic Testing   Negative genetic testing:  No pathogenic variants detected on the [employer] CancerNext-Expanded + RNAinsight panel. The report date is 09/06/2020.   The CancerNext-Expanded + RNAinsight gene panel offered by [employer] and includes sequencing and rearrangement analysis for the following 77 genes: AIP, ALK, APC, ATM, AXIN2, BAP1, BARD1, BLM, BMPR1A, BRCA1, BRCA2, BRIP1, CDC73, CDH1, CDK4, CDKN1B, CDKN2A, CHEK2, CTNNA1, DICER1, FANCC, FH, FLCN, GALNT12, KIF1B, LZTR1, MAX, MEN1, MET, MLH1, MSH2, MSH3, MSH6, MUTYH, NBN, NF1, NF2, NTHL1, PALB2, PHOX2B, PMS2, POT1, PRKAR1A, PTCH1, PTEN, RAD51C, RAD51D, RB1, RECQL, RET, SDHA, SDHAF2, SDHB, SDHC, SDHD, SMAD4, SMARCA4, SMARCB1,  SMARCE1, STK11, SUFU, TMEM127, TP53, TSC1, TSC2, VHL and XRCC2 (sequencing and deletion/duplication); EGFR, EGLN1, HOXB13, KIT, MITF, PDGFRA, POLD1 and POLE (sequencing only); EPCAM and GREM1 (deletion/duplication only). RNA data is routinely analyzed for use in variant interpretation for all genes.   Carcinoma of breast metastatic to bone ([place])  08/17/2020 Initial Diagnosis   Carcinoma of breast metastatic to bone [facility]); Goserelin/Zoladex started and to be given every 4 weeks    Genetic Testing   Negative genetic testing:  No pathogenic variants detected on the [employer] CancerNext-Expanded + RNAinsight panel. The report date is 09/06/2020.   The CancerNext-Expanded + RNAinsight gene panel offered by [employer] and includes sequencing and rearrangement analysis for the following 77 genes: AIP, ALK, APC, ATM, AXIN2, BAP1, BARD1, BLM, BMPR1A, BRCA1, BRCA2, BRIP1, CDC73, CDH1, CDK4, CDKN1B, CDKN2A, CHEK2, CTNNA1, DICER1, FANCC, FH, FLCN, GALNT12, KIF1B, LZTR1, MAX, MEN1, MET, MLH1, MSH2, MSH3, MSH6, MUTYH, NBN, NF1, NF2, NTHL1, PALB2, PHOX2B, PMS2, POT1, PRKAR1A, PTCH1, PTEN, RAD51C, RAD51D, RB1, RECQL, RET, SDHA, SDHAF2, SDHB, SDHC, SDHD, SMAD4, SMARCA4, SMARCB1, SMARCE1, STK11, SUFU, TMEM127, TP53, TSC1, TSC2, VHL and XRCC2 (sequencing and deletion/duplication); EGFR, EGLN1, HOXB13, KIT, MITF, PDGFRA, POLD1 and POLE (sequencing only); EPCAM and GREM1 (deletion/duplication only). RNA data is routinely analyzed for use in variant interpretation for all genes.   08/18/2020 - 08/30/2020 Radiation Therapy   Palliative radiation to the lumbar and pelvic bone; 30 Gy in 10 fractions   08/31/2020 -  Adjuvant Chemotherapy   Started with Herceptin/Perjeta on 08/31/2020 and Taxotere on 09/04/2020; she was hospitalized with dehydration and refractory diarrhea for 11 days, she resumed treatment with Taxotere (dose reduced by 72m/m2) and Herceptin only (perjeta discontinued) on 09/26/2020, reintroduced 10/17/2020  09/03/2020 Cancer Staging   Staging form: Breast, AJCC 8th Edition - Clinical stage from 09/03/2020: Stage IV (cT4b, cN1, cM1, G2, ER+, PR+, HER2+) - Signed by Gardenia Phlegm, NP on 10/03/2020 Stage prefix: Initial diagnosis Method of lymph node assessment: Clinical Histologic grading system: 3 grade system   07/16/2021 Surgery   S/p BSO, benign pathology      CHIEF COMPLIANT: Herceptin Perjeta maintenance   INTERVAL HISTORY: Ellen [name] is a 26 y.o with the above mention. She presents to the clinic for a follow-up. States that the antibiotics did help the pain but she can still feel the lymph node under her left arm. She also had her ovaries removed and that went well.  She went to [facility] and met with Dr. Wetzel Bjornstad who obtained a mammogram ultrasound and a couple of biopsies on the breast and the pathology from that is pending.  Based on the results she has another appointment to see Dr. Wetzel Bjornstad on 10/17/2021.  At that appointment she will likely discuss any treatment plan change.  Possible options could include Enhertu.   ALLERGIES:  is allergic to other.  MEDICATIONS:  Current Outpatient Medications  Medication Sig Dispense Refill   acyclovir (ZOVIRAX) 800 MG tablet Take 1 tablet (800 mg total) by mouth 2 (two) times daily as needed (flare up). 60 tablet 5   acyclovir ointment (ZOVIRAX) 5 % Apply 1 application. topically 4 (four) times daily as needed (outbreak).     amoxicillin (AMOXIL) 500 MG tablet Take 1 tablet (500 mg total) by mouth 2 (two) times daily. 10 tablet 0   anastrozole (ARIMIDEX) 1 MG tablet Take 1 tablet (1 mg total) by mouth daily. 90 tablet 3   ibuprofen (ADVIL) 800 MG tablet Take 1 tablet (800 mg total) by mouth every 8 (eight) hours as needed for moderate pain. For AFTER surgery only 30 tablet 0   lidocaine-prilocaine (EMLA) cream Apply to affected area once (Patient taking differently: Apply 1 application. topically daily as needed (prior to port access).)  30 g 3   LORazepam (ATIVAN) 0.5 MG tablet Take 1 tablet (0.5 mg total) by mouth at bedtime as needed for sleep. 30 tablet 3   magnesium oxide (MAG-OX) 400 (240 Mg) MG tablet Take 1 tablet (400 mg total) by mouth daily. 30 tablet 3   ondansetron (ZOFRAN) 8 MG tablet Take 1 tablet (8 mg total) by mouth 2 (two) times daily as needed for refractory nausea / vomiting. 30 tablet 1   oxyCODONE (OXY IR/ROXICODONE) 5 MG immediate release tablet Take 1 tablet (5 mg total) by mouth every 4 (four) hours as needed for severe pain. For AFTER surgery only, do not take and drive 15 tablet 0   prochlorperazine (COMPAZINE) 10 MG tablet Take 1 tablet (10 mg total) by mouth every 6 (six) hours as needed for nausea or vomiting. 30 tablet 1   senna-docusate (SENOKOT-S) 8.6-50 MG tablet Take 2 tablets by mouth at bedtime. For AFTER surgery, do not take if having diarrhea 30 tablet 0   venlafaxine XR (EFFEXOR-XR) 150 MG 24 hr capsule Take 1 capsule (150 mg total) by mouth daily with breakfast. 30 capsule 6   VYVANSE 50 MG capsule Take 50 mg by mouth daily.     No current facility-administered medications for this visit.    PHYSICAL EXAMINATION: ECOG PERFORMANCE STATUS: 1 - Symptomatic but completely ambulatory  Vitals:   08/27/21 0941  BP: 120/70  Pulse: 79  Resp: 18  Temp: (!) 97.5 F (  36.4 C)  SpO2: 99%   Filed Weights   08/27/21 0941  Weight: 145 lb 6.4 oz (66 kg)   Breast: Palpable left axillary lymph node  LABORATORY DATA:  I have reviewed the data as listed    Latest Ref Rng & Units 08/27/2021    9:35 AM 07/30/2021   10:07 AM 07/09/2021   11:33 AM  CMP  Glucose 70 - 99 mg/dL 103  115  96   BUN 6 - 20 mg/dL 16  13  16    Creatinine 0.44 - 1.00 mg/dL 0.75  0.75  0.70   Sodium 135 - 145 mmol/L 138  142  137   Potassium 3.5 - 5.1 mmol/L 4.2  3.7  4.4   Chloride 98 - 111 mmol/L 107  109  104   CO2 22 - 32 mmol/L 26  27  28    Calcium 8.9 - 10.3 mg/dL 9.5  9.3  9.3   Total Protein 6.5 - 8.1 g/dL  7.1  6.8    Total Bilirubin 0.3 - 1.2 mg/dL 0.4  0.4    Alkaline Phos 38 - 126 U/L 66  52    AST 15 - 41 U/L 13  11    ALT 0 - 44 U/L 13  15      Lab Results  Component Value Date   WBC 7.2 08/27/2021   HGB 12.5 08/27/2021   HCT 36.7 08/27/2021   MCV 88.4 08/27/2021   PLT 276 08/27/2021   NEUTROABS 5.1 08/27/2021    ASSESSMENT & PLAN:  Carcinoma of breast metastatic to bone ([place]) 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, FISH positive ratio 2.8, Ki-67 25%, lymph node positive   Palliative radiation completed 09/03/2020 Taxotere/Herceptin/Perjeta 08/31/2020-12/18/2020 Herceptin Perjeta Maintenance  beginning 01/08/2021     PET/CT 07/07/2021: Interval decrease in the left breast mass having an SUV of 2.7, small nodule left breast SUV 5.6, left axillary lymph node SUV 3.35, liver metastases no longer visible.  Sclerosis involving bone metastases.  New compression fracture L3 vertebral spine no sclerotic   Treatment plan:  Herceptin Perjeta maintenance  ------------------------------------------------------------------------------------------------------------------------------------ Current treatment:  Herceptin Perjeta, s/p BSO, and anastrozole  Suspected lymphadenitis left axilla: Treated with antibiotics with only minimal improvement. Status post biopsy of the breast at [facility] on 08/26/2021: Results are pending. Once results are back I will discuss with Dr. Wetzel Bjornstad about possible treatment changes plus or minus surgery. She is going to [place] for 2 weeks starting now and when she comes back after a week she is going to [place]. If we need to change treatments we will change it for the 09/24/2021 appointment.   No orders of the defined types were placed in this encounter.  The patient has a good understanding of the  overall plan. she agrees with it. she will call with any problems that may develop before the next visit here. Total time spent: 30 mins including face to face time and time spent for planning, charting and co-ordination of care   Harriette Ohara, MD 08/27/21    I Gardiner Coins am scribing for Dr. Lindi Adie  I have reviewed the above documentation for accuracy and completeness, and I agree with the above.

## 2021-08-27 NOTE — Progress Notes (Signed)
[facility] Spiritual Care Note  Followed up with Ellen [name] in infusion. She is doing significant emotional work for spiritual growth. Key themes include self-differentiation, relationships, and working toward personal goals. She plans to use the [employer] Find a Optician, dispensing in order to identify a local counselor whom she would like to work with. We also set up a follow-up [place] session in chaplain's office following two international trips in August.   [place], [place], [facility] Pager [phone] Voicemail [phone]

## 2021-08-27 NOTE — Assessment & Plan Note (Signed)
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, ER 30%, PR 20%, HER2 equivocal by IHC, FISH positiveratio 2.8, Ki-67 25%, lymph node positive  Palliative radiation completed 09/03/2020 Taxotere/Herceptin/Perjeta 08/31/2020-12/18/2020 Herceptin Perjeta Maintenance beginning 01/08/2021   PET/CT 07/07/2021: Interval decrease in the left breast mass having an SUV of 2.7, small nodule left breast SUV 5.6, left axillary lymph node SUV 3.35, liver metastases no longer visible.  Sclerosis involving bone metastases.  New compression fracture L3 vertebral spine no sclerotic  Treatment plan: Herceptin Perjeta maintenance  ------------------------------------------------------------------------------------------------------------------------------------ Current treatment: Herceptin Perjeta, s/p BSO, and anastrozole  Suspected lymphadenitis left axilla: Treated with antibiotics

## 2021-08-28 ENCOUNTER — Ambulatory Visit: Payer: Medicaid Other | Admitting: Hematology and Oncology

## 2021-08-28 ENCOUNTER — Other Ambulatory Visit: Payer: Medicaid Other

## 2021-08-28 ENCOUNTER — Ambulatory Visit: Payer: Medicaid Other

## 2021-08-28 ENCOUNTER — Other Ambulatory Visit (HOSPITAL_COMMUNITY): Payer: Self-pay

## 2021-08-30 ENCOUNTER — Encounter: Payer: Self-pay | Admitting: Hematology and Oncology

## 2021-08-30 ENCOUNTER — Other Ambulatory Visit (HOSPITAL_COMMUNITY): Payer: Self-pay

## 2021-09-02 ENCOUNTER — Encounter: Payer: Self-pay | Admitting: Hematology and Oncology

## 2021-09-03 ENCOUNTER — Other Ambulatory Visit: Payer: Self-pay | Admitting: Hematology and Oncology

## 2021-09-03 DIAGNOSIS — C50919 Malignant neoplasm of unspecified site of unspecified female breast: Secondary | ICD-10-CM

## 2021-09-03 DIAGNOSIS — C50912 Malignant neoplasm of unspecified site of left female breast: Secondary | ICD-10-CM

## 2021-09-03 NOTE — Progress Notes (Signed)
Pathology report from [facility] was reviewed Biopsy of the left breast mass at 2 o'clock position 3 cm from the nipple came back as IDC with lobular features ER 90%, PR 0%, HER2 1+ (low) I discussed with Dr. Wetzel Bjornstad and we agreed that the best treatment for her would be with Enhertu. Patient is currently in [place] I left her a voicemail with my recommendation.

## 2021-09-03 NOTE — Progress Notes (Signed)
DISCONTINUE ON PATHWAY REGIMEN - Breast     Cycle 1: A cycle is 21 days:     Pertuzumab      Trastuzumab-xxxx      Docetaxel    Cycles 2 through 8: A cycle is every 21 days:     Pertuzumab      Trastuzumab-xxxx      Docetaxel    Cycles 9 and beyond: A cycle is every 21 days:     Pertuzumab      Trastuzumab-xxxx   **Always confirm dose/schedule in your pharmacy ordering system**  REASON: Disease Progression PRIOR TREATMENT: [id number]: Docetaxel + Trastuzumab IV + Pertuzumab IV (THP IV) q21 Days x 8 Cycles, Followed by Trastuzumab IV + Pertuzumab IV q21 Days TREATMENT RESPONSE: Partial Response (PR)  START ON PATHWAY REGIMEN - Breast     A cycle is every 21 days:     Fam-trastuzumab deruxtecan-nxki   **Always confirm dose/schedule in your pharmacy ordering system**  Patient Characteristics: Distant Metastases or Locoregional Recurrent Disease - Unresected or Locally Advanced Unresectable Disease Progressing after Neoadjuvant and Local Therapies, ER Negative/Unknown, Chemotherapy, HER2 Low, Second Line Therapeutic Status: Distant Metastases HER2 Status: Low ER Status: Negative (-) PR Status: Negative (-) Therapy Approach Indicated: Standard Chemotherapy/Endocrine Therapy Line of Therapy: Second Line Intent of Therapy: Non-Curative / Palliative Intent, Discussed with Patient

## 2021-09-05 ENCOUNTER — Other Ambulatory Visit: Payer: Self-pay | Admitting: *Deleted

## 2021-09-05 ENCOUNTER — Telehealth: Payer: Self-pay | Admitting: Hematology and Oncology

## 2021-09-05 DIAGNOSIS — C50912 Malignant neoplasm of unspecified site of left female breast: Secondary | ICD-10-CM

## 2021-09-05 NOTE — Telephone Encounter (Signed)
Rescheduled appointment per 7/19 staff message per RN Merleen Nicely. Left voicemail.

## 2021-09-05 NOTE — Progress Notes (Signed)
Per MD request RN placed order for restaging PET scan.  Appt will be scheduled once PA approved.

## 2021-09-06 ENCOUNTER — Telehealth: Payer: Self-pay | Admitting: Hematology and Oncology

## 2021-09-06 NOTE — Progress Notes (Signed)
..  Patient is receiving Assistance Medication - Supplied Externally. Medication: Enhertu Manufacture: [employer] Approval Dates: Approved from 09/06/2021 until 09/06/2022. ID: [id number] Reason: Self Pay First DOS: 09/24/2021

## 2021-09-06 NOTE — Telephone Encounter (Signed)
Rescheduled appointment per 7/21 secure chat. Left voicemail.

## 2021-09-09 ENCOUNTER — Other Ambulatory Visit: Payer: Self-pay

## 2021-09-10 ENCOUNTER — Other Ambulatory Visit: Payer: Self-pay

## 2021-09-13 ENCOUNTER — Other Ambulatory Visit: Payer: Self-pay

## 2021-09-16 ENCOUNTER — Inpatient Hospital Stay: Payer: Medicaid Other

## 2021-09-16 ENCOUNTER — Encounter (HOSPITAL_COMMUNITY)
Admission: RE | Admit: 2021-09-16 | Discharge: 2021-09-16 | Disposition: A | Discharge status: Home / Self Care | Payer: Medicaid Other | Source: Ambulatory Visit | Attending: Hematology and Oncology | Admitting: Hematology and Oncology

## 2021-09-16 ENCOUNTER — Inpatient Hospital Stay: Payer: Medicaid Other | Admitting: Hematology and Oncology

## 2021-09-16 DIAGNOSIS — C50912 Malignant neoplasm of unspecified site of left female breast: Secondary | ICD-10-CM | POA: Diagnosis not present

## 2021-09-16 DIAGNOSIS — C7951 Secondary malignant neoplasm of bone: Secondary | ICD-10-CM | POA: Diagnosis present

## 2021-09-16 LAB — GLUCOSE, CAPILLARY: Glucose-Capillary: 98 mg/dL (ref 70–99)

## 2021-09-16 MED ORDER — FLUDEOXYGLUCOSE F - 18 (FDG) INJECTION
7.3100 | Freq: Once | INTRAVENOUS | Status: AC | PRN
  Administered 2021-09-16: 7.31 via INTRAVENOUS

## 2021-09-16 MED FILL — Dexamethasone Sodium Phosphate Inj 100 MG/10ML: INTRAMUSCULAR | Qty: 1 | Status: AC

## 2021-09-17 ENCOUNTER — Other Ambulatory Visit: Payer: Self-pay

## 2021-09-17 ENCOUNTER — Encounter: Payer: Self-pay | Admitting: Adult Health

## 2021-09-17 ENCOUNTER — Inpatient Hospital Stay (HOSPITAL_BASED_OUTPATIENT_CLINIC_OR_DEPARTMENT_OTHER): Payer: Medicaid Other | Admitting: Adult Health

## 2021-09-17 ENCOUNTER — Inpatient Hospital Stay: Payer: Medicaid Other

## 2021-09-17 ENCOUNTER — Inpatient Hospital Stay: Payer: Medicaid Other | Attending: Adult Health

## 2021-09-17 VITALS — BP 114/72 | HR 69 | Resp 20

## 2021-09-17 VITALS — BP 114/83 | HR 79 | Temp 97.4°F | Resp 18 | Ht 65.0 in | Wt 148.5 lb

## 2021-09-17 DIAGNOSIS — C7951 Secondary malignant neoplasm of bone: Secondary | ICD-10-CM | POA: Diagnosis not present

## 2021-09-17 DIAGNOSIS — Z800 Family history of malignant neoplasm of digestive organs: Secondary | ICD-10-CM | POA: Insufficient documentation

## 2021-09-17 DIAGNOSIS — R5383 Other fatigue: Secondary | ICD-10-CM | POA: Insufficient documentation

## 2021-09-17 DIAGNOSIS — C50912 Malignant neoplasm of unspecified site of left female breast: Secondary | ICD-10-CM | POA: Diagnosis not present

## 2021-09-17 DIAGNOSIS — Z808 Family history of malignant neoplasm of other organs or systems: Secondary | ICD-10-CM | POA: Diagnosis not present

## 2021-09-17 DIAGNOSIS — I2510 Atherosclerotic heart disease of native coronary artery without angina pectoris: Secondary | ICD-10-CM | POA: Diagnosis not present

## 2021-09-17 DIAGNOSIS — C50919 Malignant neoplasm of unspecified site of unspecified female breast: Secondary | ICD-10-CM

## 2021-09-17 DIAGNOSIS — N179 Acute kidney failure, unspecified: Secondary | ICD-10-CM | POA: Diagnosis not present

## 2021-09-17 DIAGNOSIS — Z923 Personal history of irradiation: Secondary | ICD-10-CM | POA: Diagnosis not present

## 2021-09-17 DIAGNOSIS — Z9221 Personal history of antineoplastic chemotherapy: Secondary | ICD-10-CM | POA: Diagnosis not present

## 2021-09-17 DIAGNOSIS — Z5111 Encounter for antineoplastic chemotherapy: Secondary | ICD-10-CM | POA: Insufficient documentation

## 2021-09-17 DIAGNOSIS — Z5112 Encounter for antineoplastic immunotherapy: Secondary | ICD-10-CM | POA: Insufficient documentation

## 2021-09-17 DIAGNOSIS — C50312 Malignant neoplasm of lower-inner quadrant of left female breast: Secondary | ICD-10-CM | POA: Diagnosis not present

## 2021-09-17 DIAGNOSIS — J45909 Unspecified asthma, uncomplicated: Secondary | ICD-10-CM | POA: Insufficient documentation

## 2021-09-17 DIAGNOSIS — Z95828 Presence of other vascular implants and grafts: Secondary | ICD-10-CM

## 2021-09-17 DIAGNOSIS — Z79899 Other long term (current) drug therapy: Secondary | ICD-10-CM | POA: Diagnosis not present

## 2021-09-17 DIAGNOSIS — R110 Nausea: Secondary | ICD-10-CM | POA: Diagnosis not present

## 2021-09-17 LAB — CBC WITH DIFFERENTIAL/PLATELET
Abs Immature Granulocytes: 0.02 10*3/uL (ref 0.00–0.07)
Basophils Absolute: 0 10*3/uL (ref 0.0–0.1)
Basophils Relative: 1 %
Eosinophils Absolute: 0.2 10*3/uL (ref 0.0–0.5)
Eosinophils Relative: 2 %
HCT: 34.2 % — ABNORMAL LOW (ref 36.0–46.0)
Hemoglobin: 11.7 g/dL — ABNORMAL LOW (ref 12.0–15.0)
Immature Granulocytes: 0 %
Lymphocytes Relative: 28 %
Lymphs Abs: 1.7 10*3/uL (ref 0.7–4.0)
MCH: 30.2 pg (ref 26.0–34.0)
MCHC: 34.2 g/dL (ref 30.0–36.0)
MCV: 88.4 fL (ref 80.0–100.0)
Monocytes Absolute: 0.6 10*3/uL (ref 0.1–1.0)
Monocytes Relative: 9 %
Neutro Abs: 3.8 10*3/uL (ref 1.7–7.7)
Neutrophils Relative %: 60 %
Platelets: 249 10*3/uL (ref 150–400)
RBC: 3.87 MIL/uL (ref 3.87–5.11)
RDW: 12.4 % (ref 11.5–15.5)
WBC: 6.2 10*3/uL (ref 4.0–10.5)
nRBC: 0 % (ref 0.0–0.2)

## 2021-09-17 LAB — COMPREHENSIVE METABOLIC PANEL
ALT: 17 U/L (ref 0–44)
AST: 15 U/L (ref 15–41)
Albumin: 3.8 g/dL (ref 3.5–5.0)
Alkaline Phosphatase: 51 U/L (ref 38–126)
Anion gap: 4 — ABNORMAL LOW (ref 5–15)
BUN: 14 mg/dL (ref 6–20)
CO2: 28 mmol/L (ref 22–32)
Calcium: 8.9 mg/dL (ref 8.9–10.3)
Chloride: 107 mmol/L (ref 98–111)
Creatinine, Ser: 0.77 mg/dL (ref 0.44–1.00)
GFR, Estimated: >60 mL/min (ref >60)
Glucose, Bld: 109 mg/dL — ABNORMAL HIGH (ref 70–99)
Potassium: 3.7 mmol/L (ref 3.5–5.1)
Sodium: 139 mmol/L (ref 135–145)
Total Bilirubin: 0.2 mg/dL — ABNORMAL LOW (ref 0.3–1.2)
Total Protein: 6.5 g/dL (ref 6.5–8.1)

## 2021-09-17 MED ORDER — SODIUM CHLORIDE 0.9% FLUSH
10.0000 mL | INTRAVENOUS | Status: DC | PRN
  Administered 2021-09-17: 10 mL

## 2021-09-17 MED ORDER — SODIUM CHLORIDE 0.9% FLUSH
10.0000 mL | Freq: Once | INTRAVENOUS | Status: AC
  Administered 2021-09-17: 10 mL

## 2021-09-17 MED ORDER — ACETAMINOPHEN 325 MG PO TABS
650.0000 mg | ORAL_TABLET | Freq: Once | ORAL | Status: AC
  Administered 2021-09-17: 650 mg via ORAL
  Filled 2021-09-17: qty 2

## 2021-09-17 MED ORDER — LORAZEPAM 0.5 MG PO TABS: 0.5000 mg | ORAL_TABLET | Freq: Three times a day (TID) | ORAL | 0 refills | Status: DC | PRN

## 2021-09-17 MED ORDER — SODIUM CHLORIDE 0.9 % IV SOLN
10.0000 mg | Freq: Once | INTRAVENOUS | Status: AC
  Administered 2021-09-17: 10 mg via INTRAVENOUS
  Filled 2021-09-17: qty 10

## 2021-09-17 MED ORDER — HEPARIN SOD (PORK) LOCK FLUSH 100 UNIT/ML IV SOLN
500.0000 [IU] | Freq: Once | INTRAVENOUS | Status: AC | PRN
  Administered 2021-09-17: 500 [IU]

## 2021-09-17 MED ORDER — FAM-TRASTUZUMAB DERUXTECAN-NXKI CHEMO 100 MG IV SOLR
5.4000 mg/kg | Freq: Once | INTRAVENOUS | Status: AC
  Administered 2021-09-17: 356 mg via INTRAVENOUS
  Filled 2021-09-17: qty 17.8

## 2021-09-17 MED ORDER — DEXTROSE 5 % IV SOLN: Freq: Once | INTRAVENOUS | Status: AC

## 2021-09-17 MED ORDER — PALONOSETRON HCL INJECTION 0.25 MG/5ML
0.2500 mg | Freq: Once | INTRAVENOUS | Status: AC
  Administered 2021-09-17: 0.25 mg via INTRAVENOUS
  Filled 2021-09-17: qty 5

## 2021-09-17 MED ORDER — DIPHENHYDRAMINE HCL 25 MG PO CAPS
50.0000 mg | ORAL_CAPSULE | Freq: Once | ORAL | Status: AC
  Administered 2021-09-17: 50 mg via ORAL
  Filled 2021-09-17: qty 2

## 2021-09-17 NOTE — Patient Instructions (Signed)

## 2021-09-17 NOTE — Patient Instructions (Signed)
[facility]  Discharge Instructions: Thank you for choosing [facility] to provide your oncology and hematology care.   If you have a lab appointment with the [facility], please go directly to the [facility] and check in at the registration area.   Wear comfortable clothing and clothing appropriate for easy access to any Portacath or PICC line.   We strive to give you quality time with your provider. You may need to reschedule your appointment if you arrive late (15 or more minutes).  Arriving late affects you and other patients whose appointments are after yours.  Also, if you miss three or more appointments without notifying the office, you may be dismissed from the clinic at the provider's discretion.      For prescription refill requests, have your pharmacy contact our office and allow 72 hours for refills to be completed.    Today you received the following chemotherapy and/or immunotherapy agents: Enhertu.      To help prevent nausea and vomiting after your treatment, we encourage you to take your nausea medication as directed.  BELOW ARE SYMPTOMS THAT SHOULD BE REPORTED IMMEDIATELY: *FEVER GREATER THAN 100.4 F (38 C) OR HIGHER *CHILLS OR SWEATING *NAUSEA AND VOMITING THAT IS NOT CONTROLLED WITH YOUR NAUSEA MEDICATION *UNUSUAL SHORTNESS OF BREATH *UNUSUAL BRUISING OR BLEEDING *URINARY PROBLEMS (pain or burning when urinating, or frequent urination) *BOWEL PROBLEMS (unusual diarrhea, constipation, pain near the anus) TENDERNESS IN MOUTH AND THROAT WITH OR WITHOUT PRESENCE OF ULCERS (sore throat, sores in mouth, or a toothache) UNUSUAL RASH, SWELLING OR PAIN  UNUSUAL VAGINAL DISCHARGE OR ITCHING   Items with * indicate a potential emergency and should be followed up as soon as possible or go to the Emergency Department if any problems should occur.  Please show the CHEMOTHERAPY ALERT CARD or IMMUNOTHERAPY ALERT CARD at check-in to  the Emergency Department and triage nurse.  Should you have questions after your visit or need to cancel or reschedule your appointment, please contact [facility]  Dept: [phone]  and follow the prompts.  Office hours are 8:00 a.m. to 4:30 p.m. Monday - Friday. Please note that voicemails left after 4:00 p.m. may not be returned until the following business day.  We are closed weekends and major holidays. You have access to a nurse at all times for urgent questions. Please call the main number to the clinic Dept: [phone] and follow the prompts.   For any non-urgent questions, you may also contact your provider using MyChart. We now offer e-Visits for anyone 14 and older to request care online for non-urgent symptoms. For details visit [email or web address].   Also download the MyChart app! Go to the app store, search "MyChart", open the app, select [facility], and log in with your MyChart username and password.  Masks are optional in the cancer centers. If you would like for your care team to wear a mask while they are taking care of you, please let them know. You may have one support person who is at least 27 years old accompany you for your appointments. Fam-Trastuzumab Deruxtecan Injection What is this medication? FAM-TRASTUZUMAB DERUXTECAN (fam-tras TOOZ eu mab DER ux TEE kan) treats some types of cancer. It works by blocking a protein that causes cancer cells to grow and multiply. This helps to slow or stop the spread of cancer cells. This medicine may be used for other purposes; ask your health care provider or pharmacist  if you have questions. COMMON BRAND NAME(S): ENHERTU What should I tell my care team before I take this medication? They need to know if you have any of these conditions: Heart disease Heart failure Infection, especially a viral infection, such as chickenpox, cold sores, or herpes Liver disease Lung or breathing disease, such as  asthma or COPD An unusual or allergic reaction to fam-trastuzumab deruxtecan, other medications, foods, dyes, or preservatives Pregnant or trying to get pregnant Breast-feeding How should I use this medication? This medication is injected into a vein. It is given by your care team in a hospital or clinic setting. A special MedGuide will be given to you before each treatment. Be sure to read this information carefully each time. Talk to your care team about the use of this medication in children. Special care may be needed. Overdosage: If you think you have taken too much of this medicine contact a poison control center or emergency room at once. NOTE: This medicine is only for you. Do not share this medicine with others. What if I miss a dose? It is important not to miss your dose. Call your care team if you are unable to keep an appointment. What may interact with this medication? Interactions are not expected. This list may not describe all possible interactions. Give your health care provider a list of all the medicines, herbs, non-prescription drugs, or dietary supplements you use. Also tell them if you smoke, drink alcohol, or use illegal drugs. Some items may interact with your medicine. What should I watch for while using this medication? Visit your care team for regular checks on your progress. Tell your care team if your symptoms do not start to get better or if they get worse. Your condition will be monitored carefully while you are receiving this medication. Do not become pregnant while taking this medication or for 7 months after stopping it. Women should inform their care team if they wish to become pregnant or think they might be pregnant. Men should not father a child while taking this medication and for 4 months after stopping it. There is potential for serious side effects to an unborn child. Talk to your care team for more information. Do not breast-feed an infant while taking  this medication or for 7 months after the last dose. This medication has caused decreased sperm counts in some men. This may make it more difficult to father a child. Talk to your care team if you are concerned about your fertility. This medication may increase your risk to bruise or bleed. Call your care team if you notice any unusual bleeding. Be careful brushing or flossing your teeth or using a toothpick because you may get an infection or bleed more easily. If you have any dental work done, tell your dentist you are receiving this medication. This medication may cause dry eyes and blurred vision. If you wear contact lenses, you may feel some discomfort. Lubricating eye drops may help. See your care team if the problem does not go away or is severe. This medication may increase your risk of getting an infection. Call your care team for advice if you get a fever, chills, sore throat, or other symptoms of a cold or flu. Do not treat yourself. Try to avoid being around people who are sick. Avoid taking medications that contain aspirin, acetaminophen, ibuprofen, naproxen, or ketoprofen unless instructed by your care team. These medications may hide a fever. What side effects may I notice from receiving  this medication? Side effects that you should report to your care team as soon as possible: Allergic reactions--skin rash, itching, hives, swelling of the face, lips, tongue, or throat Dry cough, shortness of breath or trouble breathing Infection--fever, chills, cough, sore throat, wounds that don't heal, pain or trouble when passing urine, general feeling of discomfort or being unwell Heart failure--shortness of breath, swelling of the ankles, feet, or hands, sudden weight gain, unusual weakness or fatigue Unusual bruising or bleeding Side effects that usually do not require medical attention (report these to your care team if they continue or are bothersome): Constipation Diarrhea Hair loss Muscle  pain Nausea Vomiting This list may not describe all possible side effects. Call your doctor for medical advice about side effects. You may report side effects to FDA at 1-800-FDA-1088. Where should I keep my medication? This medication is given in a hospital or clinic. It will not be stored at home. NOTE: This sheet is a summary. It may not cover all possible information. If you have questions about this medicine, talk to your doctor, pharmacist, or health care provider.  2023 Elsevier/Gold Standard (2020-11-20 00:00:00)

## 2021-09-17 NOTE — Patient Instructions (Signed)
Fam-Trastuzumab Deruxtecan Injection What is this medication? FAM-TRASTUZUMAB DERUXTECAN (fam-tras TOOZ eu mab DER ux TEE kan) treats some types of cancer. It works by blocking a protein that causes cancer cells to grow and multiply. This helps to slow or stop the spread of cancer cells. This medicine may be used for other purposes; ask your health care provider or pharmacist if you have questions. COMMON BRAND NAME(S): ENHERTU What should I tell my care team before I take this medication? They need to know if you have any of these conditions: Heart disease Heart failure Infection, especially a viral infection, such as chickenpox, cold sores, or herpes Liver disease Lung or breathing disease, such as asthma or COPD An unusual or allergic reaction to fam-trastuzumab deruxtecan, other medications, foods, dyes, or preservatives Pregnant or trying to get pregnant Breast-feeding How should I use this medication? This medication is injected into a vein. It is given by your care team in a hospital or clinic setting. A special MedGuide will be given to you before each treatment. Be sure to read this information carefully each time. Talk to your care team about the use of this medication in children. Special care may be needed. Overdosage: If you think you have taken too much of this medicine contact a poison control center or emergency room at once. NOTE: This medicine is only for you. Do not share this medicine with others. What if I miss a dose? It is important not to miss your dose. Call your care team if you are unable to keep an appointment. What may interact with this medication? Interactions are not expected. This list may not describe all possible interactions. Give your health care provider a list of all the medicines, herbs, non-prescription drugs, or dietary supplements you use. Also tell them if you smoke, drink alcohol, or use illegal drugs. Some items may interact with your  medicine. What should I watch for while using this medication? Visit your care team for regular checks on your progress. Tell your care team if your symptoms do not start to get better or if they get worse. Your condition will be monitored carefully while you are receiving this medication. Do not become pregnant while taking this medication or for 7 months after stopping it. Women should inform their care team if they wish to become pregnant or think they might be pregnant. Men should not father a child while taking this medication and for 4 months after stopping it. There is potential for serious side effects to an unborn child. Talk to your care team for more information. Do not breast-feed an infant while taking this medication or for 7 months after the last dose. This medication has caused decreased sperm counts in some men. This may make it more difficult to father a child. Talk to your care team if you are concerned about your fertility. This medication may increase your risk to bruise or bleed. Call your care team if you notice any unusual bleeding. Be careful brushing or flossing your teeth or using a toothpick because you may get an infection or bleed more easily. If you have any dental work done, tell your dentist you are receiving this medication. This medication may cause dry eyes and blurred vision. If you wear contact lenses, you may feel some discomfort. Lubricating eye drops may help. See your care team if the problem does not go away or is severe. This medication may increase your risk of getting an infection. Call your care team for   advice if you get a fever, chills, sore throat, or other symptoms of a cold or flu. Do not treat yourself. Try to avoid being around people who are sick. Avoid taking medications that contain aspirin, acetaminophen, ibuprofen, naproxen, or ketoprofen unless instructed by your care team. These medications may hide a fever. What side effects may I notice from  receiving this medication? Side effects that you should report to your care team as soon as possible: Allergic reactions--skin rash, itching, hives, swelling of the face, lips, tongue, or throat Dry cough, shortness of breath or trouble breathing Infection--fever, chills, cough, sore throat, wounds that don't heal, pain or trouble when passing urine, general feeling of discomfort or being unwell Heart failure--shortness of breath, swelling of the ankles, feet, or hands, sudden weight gain, unusual weakness or fatigue Unusual bruising or bleeding Side effects that usually do not require medical attention (report these to your care team if they continue or are bothersome): Constipation Diarrhea Hair loss Muscle pain Nausea Vomiting This list may not describe all possible side effects. Call your doctor for medical advice about side effects. You may report side effects to FDA at 1-800-FDA-1088. Where should I keep my medication? This medication is given in a hospital or clinic. It will not be stored at home. NOTE: This sheet is a summary. It may not cover all possible information. If you have questions about this medicine, talk to your doctor, pharmacist, or health care provider.  2023 Elsevier/Gold Standard (2020-11-20 00:00:00)  

## 2021-09-17 NOTE — Progress Notes (Unsigned)
[facility] Follow up:    Pcp, No No address on file   DIAGNOSIS:  Cancer Staging  Carcinoma of breast metastatic to bone [facility]) Staging form: Breast, AJCC 8th Edition - Clinical stage from 09/03/2020: Stage IV (cT4b, cN1, cM1, G2, ER+, PR+, HER2+) - Signed by Gardenia Phlegm, NP on 10/03/2020 Stage prefix: Initial diagnosis Method of lymph node assessment: Clinical Histologic grading system: 3 grade system   SUMMARY OF ONCOLOGIC HISTORY: Oncology History  Primary malignant neoplasm of breast with metastasis ([place])  08/15/2020 Imaging   Large mass in the medial left breast measuring 3.5 cm.  Prominent left axillary lymph node cluster of adjacent lymph nodes, irregular nodule right middle lobe 1.1 cm, aggressive lesion right second rib cortical destruction, multiple lucent lesions throughout the thoracic spine including T1 vertebral body and T2, irregular multilobar lesion central left hepatic lobe 4.3 x 4.7 cm.  Several satellite lesions in the left lateral hepatic lobe.  Multiple sclerotic lesions in the bones iliac wings, acetabular, medial iliac bones, sacrum multiple lesions lumbar spine L3-L4 and L5   08/17/2020 Initial Diagnosis   Biopsy revealed IDC with DCIS grade 2, ER 30%, PR 20%, HER2 equivocal by IHC, FISH positive, Ki-67 25%, lymph node positive    Genetic Testing   Negative genetic testing:  No pathogenic variants detected on the [employer] CancerNext-Expanded + RNAinsight panel. The report date is 09/06/2020.   The CancerNext-Expanded + RNAinsight gene panel offered by [employer] and includes sequencing and rearrangement analysis for the following 77 genes: AIP, ALK, APC, ATM, AXIN2, BAP1, BARD1, BLM, BMPR1A, BRCA1, BRCA2, BRIP1, CDC73, CDH1, CDK4, CDKN1B, CDKN2A, CHEK2, CTNNA1, DICER1, FANCC, FH, FLCN, GALNT12, KIF1B, LZTR1, MAX, MEN1, MET, MLH1, MSH2, MSH3, MSH6, MUTYH, NBN, NF1, NF2, NTHL1, PALB2, PHOX2B, PMS2, POT1, PRKAR1A, PTCH1, PTEN, RAD51C,  RAD51D, RB1, RECQL, RET, SDHA, SDHAF2, SDHB, SDHC, SDHD, SMAD4, SMARCA4, SMARCB1, SMARCE1, STK11, SUFU, TMEM127, TP53, TSC1, TSC2, VHL and XRCC2 (sequencing and deletion/duplication); EGFR, EGLN1, HOXB13, KIT, MITF, PDGFRA, POLD1 and POLE (sequencing only); EPCAM and GREM1 (deletion/duplication only). RNA data is routinely analyzed for use in variant interpretation for all genes.   09/17/2021 -  Chemotherapy   Patient is on Treatment Plan : BREAST METASTATIC fam-trastuzumab deruxtecan-nxki (Enhertu) q21d     Carcinoma of breast metastatic to bone ([place])  08/17/2020 Initial Diagnosis   Carcinoma of breast metastatic to bone ([place]); Goserelin/Zoladex started and to be given every 4 weeks    Genetic Testing   Negative genetic testing:  No pathogenic variants detected on the [employer] CancerNext-Expanded + RNAinsight panel. The report date is 09/06/2020.   The CancerNext-Expanded + RNAinsight gene panel offered by [employer] and includes sequencing and rearrangement analysis for the following 77 genes: AIP, ALK, APC, ATM, AXIN2, BAP1, BARD1, BLM, BMPR1A, BRCA1, BRCA2, BRIP1, CDC73, CDH1, CDK4, CDKN1B, CDKN2A, CHEK2, CTNNA1, DICER1, FANCC, FH, FLCN, GALNT12, KIF1B, LZTR1, MAX, MEN1, MET, MLH1, MSH2, MSH3, MSH6, MUTYH, NBN, NF1, NF2, NTHL1, PALB2, PHOX2B, PMS2, POT1, PRKAR1A, PTCH1, PTEN, RAD51C, RAD51D, RB1, RECQL, RET, SDHA, SDHAF2, SDHB, SDHC, SDHD, SMAD4, SMARCA4, SMARCB1, SMARCE1, STK11, SUFU, TMEM127, TP53, TSC1, TSC2, VHL and XRCC2 (sequencing and deletion/duplication); EGFR, EGLN1, HOXB13, KIT, MITF, PDGFRA, POLD1 and POLE (sequencing only); EPCAM and GREM1 (deletion/duplication only). RNA data is routinely analyzed for use in variant interpretation for all genes.   08/18/2020 - 08/30/2020 Radiation Therapy   Palliative radiation to the lumbar and pelvic bone; 30 Gy in 10 fractions   08/31/2020 - 12/18/2020 Adjuvant Chemotherapy  Started with Herceptin/Perjeta on 08/31/2020 and Taxotere on 09/04/2020; she was  hospitalized with dehydration and refractory diarrhea for 11 days, she resumed treatment with Taxotere (dose reduced by 57m/m2) and Herceptin only (perjeta discontinued) on 09/26/2020, reintroduced 10/17/2020   09/03/2020 Cancer Staging   Staging form: Breast, AJCC 8th Edition - Clinical stage from 09/03/2020: Stage IV (cT4b, cN1, cM1, G2, ER+, PR+, HER2+) - Signed by CGardenia Phlegm NP on 10/03/2020 Stage prefix: Initial diagnosis Method of lymph node assessment: Clinical Histologic grading system: 3 grade system   01/08/2021 - 08/27/2021 Adjuvant Chemotherapy   Herceptin/Perjeta every 3 weeks, Zometa every 12 weeks added 01/29/2021   07/07/2021 PET scan   1. Interval decrease in size of the dominant left breast mass which now exhibits mild low level FDG uptake with SUV max of 2.7. Small nodule within the left breast exhibits moderate tracer uptake within SUV max of 5.6 and is suspicious for residual/recurrent local tumor. 2. Mild tracer uptake associated with prominent left axillary lymph node with SUV max of 3.35. Residual tracer avid nodal metastasis not excluded. 3. The previously noted liver metastasis are no longer visible on the CT images from today's study. There is no abnormal increased uptake within the left hepatic lobe above background liver activity to suggest metabolically active tumor metastasis. 4. Interval increase in sclerosis associated with previously noted lytic bone metastasis without significant increased uptake above background bone marrow activity compatible with healing multifocal bone metastasis. 5. New pathologic fracture involves the L3 vertebral body where there was extensive lytic changes noted on the previous CT. The collapsed vertebral body is now sclerotic with mild low level FDG uptake. 6. Similar appearance of soft tissue infiltration within the anterior mediastinal fat which exhibits mild FDG uptake on today's study. 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   07/16/2021 Surgery   S/p BSO, benign pathology    08/26/2021 Mammogram   Mammo diagnostic breast tomosynthesis left: There is an additional  irregular mass in the lower inner position at posterior depth. This was  further evaluated with ultrasound.   Mammo UKoreabreast limited left: There is a 0.7 cm x 0.5 cm x 0.7 cm  irregularly shaped, hypoechoic mass with indistinct margins seen in the  left breast at 8 o'clock, 8 cm from the nipple. This mass is 1.6 cm medial  to the palpable mass as detailed above.    08/26/2021 Pathology Results   New left breast mass 8:00 biopsy: Invasive carcinoma with mixed ductal and lobular and focal micropapillary features.  Grade 3, invasive carcinoma measures 9 mm with an associated lymphoid reaction.  ER negative PR negative HER2 1+.  Left breast mass 2:00 biopsy: Invasive carcinoma with mixed ductal and lobular features Nottingham grade 2 measuring 1.4 cm.  ER 9% (low positive), PR negative, HER2 1+.   09/16/2021 PET scan   1. Interval progression of hypermetabolic metastatic disease. 2. Hypermetabolic bilateral axillary lymphadenopathy, subcarinal and bilateral hilar lymphadenopathy, new/progressive. 3. New hypermetabolic nodularity in the lower left breast is compatible with progressive malignancy. 4. New hypermetabolic mixed lytic and sclerotic left pelvic bone metastases as detailed. 5. Persistent patchy hypermetabolism in the anterior mediastinum associated with mixed soft tissue and fat density, favor thymic hyperplasia.   09/17/2021 -  Chemotherapy   Patient is on Treatment Plan : BREAST METASTATIC fam-trastuzumab deruxtecan-nxki (Enhertu) q21d       CURRENT THERAPY:  INTERVAL HISTORY: Ellen LEACH89y.o. female returns for    Patient Active Problem  List   Diagnosis Date Noted   Port-A-Cath in place 06/03/2021   AKI (acute kidney injury) ([place]) 09/09/2020   Genetic testing 09/06/2020   Family  history of thyroid cancer 08/29/2020   Family history of pancreatic cancer 08/29/2020   Carcinoma of breast metastatic to bone ([place]) 08/17/2020   Primary malignant neoplasm of breast with metastasis ([place]) 08/16/2020   Left breast mass 08/13/2020   Genital herpes 08/13/2020   Attention deficit hyperactivity disorder (ADHD), 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   GAD (generalized anxiety disorder) 02/16/2017   Marijuana user 02/16/2017   Mild intermittent asthma without complication 83/25/4982   Severe episode of recurrent major depressive disorder, without psychotic features ([place]) 02/16/2017   Depression 04/20/2012    is allergic to other.  MEDICAL HISTORY: Past Medical History:  Diagnosis Date   ADHD (attention deficit hyperactivity disorder)    Anemia    Anxiety    Asthma    Exercise Induced   Cancer ([place])    Depression    Family history of pancreatic cancer    Family history of thyroid cancer    GERD (gastroesophageal reflux disease)    Herpes simplex    Personal history of chemotherapy 08/2020    SURGICAL HISTORY: Past Surgical History:  Procedure Laterality Date   BREAST BIOPSY Left 08/2020   PORTACATH PLACEMENT Right 08/23/2020   Procedure: INSERTION PORT-A-CATH;  Surgeon: [name], MD;  Location: [facility];  Service: General;  Laterality: Right;   ROBOTIC ASSISTED BILATERAL SALPINGO OOPHERECTOMY Bilateral 07/16/2021   Procedure: XI ROBOTIC ASSISTED BILATERAL SALPINGO OOPHORECTOMY;  Surgeon: Lafonda Mosses, MD;  Location: [facility];  Service: Gynecology;  Laterality: Bilateral;    SOCIAL HISTORY: Social History   Socioeconomic History   Marital status: Single    Spouse name: Not on file   Number of children: Not on file   Years of education: Not on file   Highest education level: Not on file  Occupational History   Not on file  Tobacco Use   Smoking status: Never   Smokeless tobacco: Never  Vaping Use   Vaping Use: Every day    Substances: THC  Substance and Sexual Activity   Alcohol use: Not Currently    Comment: 3 to 4 a week   Drug use: Yes    Types: Marijuana    Comment: Daily   Sexual activity: Yes  Other Topics Concern   Not on file  Social History Narrative   Menarche at age 20, has a cycles every 28 days that lasts about 11 days, 4 days of spotting, two days of heavy flow, and the other days mild to moderate flow.  Received Depo provera shot x 3 years, Nexplanon x 2 years, and currently has kyleena IUD.  No OCPs.  She has never been pregnant.    Social Determinants of Health   Financial Resource Strain: High Risk (05/08/2021)   Overall Financial Resource Strain (CARDIA)    Difficulty of Paying Living Expenses: Very hard  Food Insecurity: No Food Insecurity (08/13/2020)   Hunger Vital Sign    Worried About Running Out of Food in the Last Year: Never true    Ran Out of Food in the Last Year: Never true  Transportation Needs: No Transportation Needs (08/13/2020)   PRAPARE - Hydrologist (Medical): No    Lack of Transportation (Non-Medical): No  Physical Activity: Not on file  Stress: Not on file  Social Connections: Not on file  Intimate Partner Violence: Not on file    FAMILY HISTORY: Family History  Problem Relation Age of Onset   Depression Mother    Hyperlipidemia Mother    Thyroid cancer Mother 6       papillary thyroid cancer   Hypertension Father    Atrial fibrillation Father    Irritable bowel syndrome Father        also brother   ADD / ADHD Brother    Bipolar disorder Brother    Diabetes Maternal Grandfather    Heart attack Paternal Great-grandfather 85   Pancreatic cancer Maternal Great-grandfather 88       (MGM's father)   Colon cancer Neg Hx    Breast cancer Neg Hx    Ovarian cancer Neg Hx    Endometrial cancer Neg Hx    Prostate cancer Neg Hx     Review of Systems - Oncology    PHYSICAL EXAMINATION  ECOG PERFORMANCE STATUS: {CHL ONC  ECOG SJ:[id number]}  Vitals:   09/17/21 1224  BP: 114/83  Pulse: 79  Resp: 18  Temp: (!) 97.4 F (36.3 C)  SpO2: 100%    Physical Exam  LABORATORY DATA:  CBC    Component Value Date/Time   WBC 6.2 09/17/2021 1208   RBC 3.87 09/17/2021 1208   HGB 11.7 (L) 09/17/2021 1208   HGB 12.5 08/27/2021 0935   HCT 34.2 (L) 09/17/2021 1208   PLT 249 09/17/2021 1208   PLT 276 08/27/2021 0935   MCV 88.4 09/17/2021 1208   MCH 30.2 09/17/2021 1208   MCHC 34.2 09/17/2021 1208   RDW 12.4 09/17/2021 1208   LYMPHSABS 1.7 09/17/2021 1208   MONOABS 0.6 09/17/2021 1208   EOSABS 0.2 09/17/2021 1208   BASOSABS 0.0 09/17/2021 1208    CMP     Component Value Date/Time   NA 138 08/27/2021 0935   K 4.2 08/27/2021 0935   CL 107 08/27/2021 0935   CO2 26 08/27/2021 0935   GLUCOSE 103 (H) 08/27/2021 0935   BUN 16 08/27/2021 0935   CREATININE 0.75 08/27/2021 0935   CALCIUM 9.5 08/27/2021 0935   PROT 7.1 08/27/2021 0935   ALBUMIN 4.3 08/27/2021 0935   AST 13 (L) 08/27/2021 0935   ALT 13 08/27/2021 0935   ALKPHOS 66 08/27/2021 0935   BILITOT 0.4 08/27/2021 0935   GFRNONAA >60 08/27/2021 0935       PENDING LABS:   RADIOGRAPHIC STUDIES:  NM PET Image Restag (PS) Skull Base To Thigh  Result Date: 09/17/2021 CLINICAL DATA:  Subsequent treatment strategy for stage IV left breast cancer on immunotherapy. EXAM: NUCLEAR MEDICINE PET SKULL BASE TO THIGH TECHNIQUE: 7.3 mCi F-18 FDG was injected intravenously. Full-ring PET imaging was performed from the skull base to thigh after the radiotracer. CT data was obtained and used for attenuation correction and anatomic localization. Fasting blood glucose: 98 mg/dl COMPARISON:  07/05/2021 PET-CT. FINDINGS: Mediastinal blood pool activity: SUV max 2.3 Liver activity: SUV max NA NECK: No hypermetabolic lymph nodes in the neck. Incidental CT findings: Right internal jugular Port-A-Cath terminates in the right atrium. CHEST: Enlarged hypermetabolic bilateral  axillary lymph nodes, increased in size and metabolism bilaterally in the interval. Representative 1.3 cm short axis diameter left axillary node with max SUV 10.1 (series 4/image 47), previously 0.8 cm with max SUV 2.2. Representative 1.0 cm right axillary node with max SUV 6.2 (series 4/image 51), previously 0.5 cm with max SUV 0.9. Newly hypermetabolic 0.9 cm right subcarinal node with max SUV 3.6 (series  4/image 65). New hypermetabolic bilateral hilar lymphadenopathy with max SUV 4.2 in the right hilum and max SUV 4.8 in the left hilum. Persistent patchy hypermetabolism in the anterior mediastinum associated with mixed soft tissue and fat density, compatible with thymic hyperplasia. No hypermetabolic pulmonary findings. New irregular nodular foci of hypermetabolism in the lower left breast, for example measuring 2.0 cm with max SUV 9.5 (series 4/image 75). Previously described hypermetabolic inner left breast 1.2 cm soft tissue nodule with max SUV 5.6 is mildly increased in size to 1.7 cm with max SUV 5.5 (series 4/image 71), not appreciably changed in metabolism. Incidental CT findings: No significant pulmonary nodules. ABDOMEN/PELVIS: No abnormal hypermetabolic activity within the liver, pancreas, adrenal glands, or spleen. No hypermetabolic lymph nodes in the abdomen or pelvis. Incidental CT findings: none SKELETON: New hypermetabolic mixed lytic and sclerotic lesion in the superior aspect of the medial left iliac bone with max SUV 7.7. New hypermetabolic mixed lytic and sclerotic lesion in the anterior inferior left iliac crest with max SUV 5.4. Incidental CT findings: Patchy sclerotic lesions throughout the axial and proximal appendicular skeleton are otherwise non hypermetabolic. Chronic L3 pathologic vertebral compression fracture. IMPRESSION: 1. Interval progression of hypermetabolic metastatic disease. 2. Hypermetabolic bilateral axillary lymphadenopathy, subcarinal and bilateral hilar lymphadenopathy,  new/progressive. 3. New hypermetabolic nodularity in the lower left breast is compatible with progressive malignancy. 4. New hypermetabolic mixed lytic and sclerotic left pelvic bone metastases as detailed. 5. Persistent patchy hypermetabolism in the anterior mediastinum associated with mixed soft tissue and fat density, favor thymic hyperplasia. Electronically Signed   By: Ilona Sorrel M.D.   On: 09/17/2021 09:07     PATHOLOGY:     ASSESSMENT and THERAPY PLAN:   No problem-specific Assessment & Plan notes found for this encounter.   No orders of the defined types were placed in this encounter.   All questions were answered. The patient knows to call the clinic with any problems, questions or concerns. We can certainly see the patient much sooner if necessary. This note was electronically signed. Scot Dock, NP 09/17/2021

## 2021-09-18 ENCOUNTER — Telehealth: Payer: Self-pay | Admitting: *Deleted

## 2021-09-18 NOTE — Telephone Encounter (Signed)
Called to check on pt post treatment.  Received vm.  Message left to call back in am if any concerns or problems & speak with Dr Geralyn Flash nurse.

## 2021-09-18 NOTE — Telephone Encounter (Signed)
-----   Message from Rafael Bihari, RN sent at 09/17/2021  4:09 PM EDT ----- Regarding: Dr. Lindi Adie pt, first time Enhertu Dr. Lindi Adie pt, came in 8/1 for first time Enhertu. Tolerated infusion well. Needs call back.

## 2021-09-19 ENCOUNTER — Encounter: Payer: Self-pay | Admitting: Hematology and Oncology

## 2021-09-19 NOTE — Assessment & Plan Note (Addendum)
Ellen [name] is a 26 year old woman with metastatic breast cancer who is changing treatment to Enhertu due to progression of her cancer.    She and I discussed the enhertu risks and benefits in detail and she is willing to proceed.  She will touch base with me in one week to make sure she is doing ok prior to her travel.  She has pain at her breast from the superficial tumor she is managing with tylenol.  I shared with her that hopefully as she gets the enhertu the cancer will shrink and her pain will decrease.

## 2021-09-20 ENCOUNTER — Encounter: Payer: Self-pay | Admitting: Adult Health

## 2021-09-20 ENCOUNTER — Other Ambulatory Visit: Payer: Self-pay

## 2021-09-20 MED ORDER — PROCHLORPERAZINE MALEATE 10 MG PO TABS: 10.0000 mg | ORAL_TABLET | Freq: Four times a day (QID) | ORAL | 0 refills | Status: DC | PRN

## 2021-09-20 MED ORDER — ONDANSETRON HCL 8 MG PO TABS: 8.0000 mg | ORAL_TABLET | Freq: Three times a day (TID) | ORAL | 0 refills | Status: DC | PRN

## 2021-09-24 ENCOUNTER — Ambulatory Visit: Payer: Medicaid Other | Admitting: Hematology and Oncology

## 2021-09-24 ENCOUNTER — Other Ambulatory Visit: Payer: Medicaid Other

## 2021-09-24 ENCOUNTER — Ambulatory Visit: Payer: Medicaid Other

## 2021-09-25 ENCOUNTER — Ambulatory Visit: Payer: Medicaid Other

## 2021-09-25 ENCOUNTER — Other Ambulatory Visit: Payer: Medicaid Other

## 2021-10-03 NOTE — Progress Notes (Signed)
Patient Care Team: Pcp, No as PCP - General [name], MD as Consulting Physician (Hematology and Oncology) [name], [name], NP as Nurse Practitioner (Hematology and Oncology) [name], MD as Consulting Physician (General Surgery)  DIAGNOSIS:  Encounter Diagnosis  Name Primary?   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  Prominent left axillary lymph node cluster of adjacent lymph nodes, irregular nodule right middle lobe 1.1 cm, aggressive lesion right second rib cortical destruction, multiple lucent lesions throughout the thoracic spine including T1 vertebral body and T2, irregular multilobar lesion central left hepatic lobe 4.3 x 4.7 cm.  Several satellite lesions in the left lateral hepatic lobe.  Multiple sclerotic lesions in the bones iliac wings, acetabular, medial iliac bones, sacrum multiple lesions lumbar spine L3-L4 and L5   08/17/2020 Initial Diagnosis   Biopsy revealed IDC with DCIS grade 2, ER 30%, PR 20%, HER2 equivocal by IHC, FISH positive, Ki-67 25%, lymph node positive    Genetic Testing   Negative genetic testing:  No pathogenic variants detected on the [employer] CancerNext-Expanded + RNAinsight panel. The report date is 09/06/2020.   The CancerNext-Expanded + RNAinsight gene panel offered by [employer] and includes sequencing and rearrangement analysis for the following 77 genes: AIP, ALK, APC, ATM, AXIN2, BAP1, BARD1, BLM, BMPR1A, BRCA1, BRCA2, BRIP1, CDC73, CDH1, CDK4, CDKN1B, CDKN2A, CHEK2, CTNNA1, DICER1, FANCC, FH, FLCN, GALNT12, KIF1B, LZTR1, MAX, MEN1, MET, MLH1, MSH2, MSH3, MSH6, MUTYH, NBN, NF1, NF2, NTHL1, PALB2, PHOX2B, PMS2, POT1, PRKAR1A, PTCH1, PTEN, RAD51C, RAD51D, RB1, RECQL, RET, SDHA, SDHAF2, SDHB, SDHC, SDHD, SMAD4, SMARCA4, SMARCB1,  SMARCE1, STK11, SUFU, TMEM127, TP53, TSC1, TSC2, VHL and XRCC2 (sequencing and deletion/duplication); EGFR, EGLN1, HOXB13, KIT, MITF, PDGFRA, POLD1 and POLE (sequencing only); EPCAM and GREM1 (deletion/duplication only). RNA data is routinely analyzed for use in variant interpretation for all genes.   09/17/2021 -  Chemotherapy   Patient is on Treatment Plan : BREAST METASTATIC fam-trastuzumab deruxtecan-nxki (Enhertu) q21d     Carcinoma of breast metastatic to bone ([place])  08/17/2020 Initial Diagnosis   Carcinoma of breast metastatic to bone ([place]); Goserelin/Zoladex started and to be given every 4 weeks    Genetic Testing   Negative genetic testing:  No pathogenic variants detected on the [facility] CancerNext-Expanded + RNAinsight panel. The report date is 09/06/2020.   The CancerNext-Expanded + RNAinsight gene panel offered by [employer] and includes sequencing and rearrangement analysis for the following 77 genes: AIP, ALK, APC, ATM, AXIN2, BAP1, BARD1, BLM, BMPR1A, BRCA1, BRCA2, BRIP1, CDC73, CDH1, CDK4, CDKN1B, CDKN2A, CHEK2, CTNNA1, DICER1, FANCC, FH, FLCN, GALNT12, KIF1B, LZTR1, MAX, MEN1, MET, MLH1, MSH2, MSH3, MSH6, MUTYH, NBN, NF1, NF2, NTHL1, PALB2, PHOX2B, PMS2, POT1, PRKAR1A, PTCH1, PTEN, RAD51C, RAD51D, RB1, RECQL, RET, SDHA, SDHAF2, SDHB, SDHC, SDHD, SMAD4, SMARCA4, SMARCB1, SMARCE1, STK11, SUFU, TMEM127, TP53, TSC1, TSC2, VHL and XRCC2 (sequencing and deletion/duplication); EGFR, EGLN1, HOXB13, KIT, MITF, PDGFRA, POLD1 and POLE (sequencing only); EPCAM and GREM1 (deletion/duplication only). RNA data is routinely analyzed for use in variant interpretation for all genes.   08/18/2020 - 08/30/2020 Radiation Therapy   Palliative radiation to the lumbar and pelvic bone; 30 Gy in 10 fractions   08/31/2020 - 12/18/2020 Adjuvant Chemotherapy   Started with Herceptin/Perjeta on 08/31/2020 and Taxotere on 09/04/2020; she was hospitalized with dehydration and refractory diarrhea  for 11 days, she resumed  treatment with Taxotere (dose reduced by 5m/m2) and Herceptin only (perjeta discontinued) on 09/26/2020, reintroduced 10/17/2020   09/03/2020 Cancer Staging   Staging form: Breast, AJCC 8th Edition - Clinical stage from 09/03/2020: Stage IV (cT4b, cN1, cM1, G2, ER+, PR+, HER2+) - Signed by CGardenia Phlegm NP on 10/03/2020 Stage prefix: Initial diagnosis Method of lymph node assessment: Clinical Histologic grading system: 3 grade system   01/08/2021 - 08/27/2021 Adjuvant Chemotherapy   Herceptin/Perjeta every 3 weeks, Zometa every 12 weeks added 01/29/2021   07/07/2021 PET scan   1. Interval decrease in size of the dominant left breast mass which now exhibits mild low level FDG uptake with SUV max of 2.7. Small nodule within the left breast exhibits moderate tracer uptake within SUV max of 5.6 and is suspicious for residual/recurrent local tumor. 2. Mild tracer uptake associated with prominent left axillary lymph node with SUV max of 3.35. Residual tracer avid nodal metastasis not excluded. 3. The previously noted liver metastasis are no longer visible on the CT images from today's study. There is no abnormal increased uptake within the left hepatic lobe above background liver activity to suggest metabolically active tumor metastasis. 4. Interval increase in sclerosis associated with previously noted lytic bone metastasis without significant increased uptake above background bone marrow activity compatible with healing multifocal bone metastasis. 5. New pathologic fracture involves the L3 vertebral body where there was extensive lytic changes noted on the previous CT. The collapsed vertebral body is now sclerotic with mild low level FDG uptake. 6. Similar appearance of soft tissue infiltration within the anterior mediastinal fat which exhibits mild FDG uptake on today's study. 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   07/16/2021 Surgery   S/p BSO, benign  pathology    08/26/2021 Mammogram   Mammo diagnostic breast tomosynthesis left: There is an additional  irregular mass in the lower inner position at posterior depth. This was  further evaluated with ultrasound.   Mammo UKoreabreast limited left: There is a 0.7 cm x 0.5 cm x 0.7 cm  irregularly shaped, hypoechoic mass with indistinct margins seen in the  left breast at 8 o'clock, 8 cm from the nipple. This mass is 1.6 cm medial  to the palpable mass as detailed above.    08/26/2021 Pathology Results   New left breast mass 8:00 biopsy: Invasive carcinoma with mixed ductal and lobular and focal micropapillary features.  Grade 3, invasive carcinoma measures 9 mm with an associated lymphoid reaction.  ER negative PR negative HER2 1+.  Left breast mass 2:00 biopsy: Invasive carcinoma with mixed ductal and lobular features Nottingham grade 2 measuring 1.4 cm.  ER 9% (low positive), PR negative, HER2 1+.   09/16/2021 PET scan   1. Interval progression of hypermetabolic metastatic disease. 2. Hypermetabolic bilateral axillary lymphadenopathy, subcarinal and bilateral hilar lymphadenopathy, new/progressive. 3. New hypermetabolic nodularity in the lower left breast is compatible with progressive malignancy. 4. New hypermetabolic mixed lytic and sclerotic left pelvic bone metastases as detailed. 5. Persistent patchy hypermetabolism in the anterior mediastinum associated with mixed soft tissue and fat density, favor thymic hyperplasia.   09/17/2021 -  Chemotherapy   Patient is on Treatment Plan : BREAST METASTATIC fam-trastuzumab deruxtecan-nxki (Enhertu) q21d       CHIEF COMPLIANT: Herceptin Perjeta maintenance   INTERVAL HISTORY: Ellen KOPLINis a 26y.o with the above mention. She presents to the clinic for a follow-up. She states that she was fatigued for 1  week after treatment. She had some nausea and vomiting the first week. She did take the compazine and ativan and it did help. She did  a lot of physical things and hiking when she went on vacation. She doesn't feel depressed all the time but at times she doesn't feel like doing anything. She denies cough or shortness of breath   ALLERGIES:  is allergic to other.  MEDICATIONS:  Current Outpatient Medications  Medication Sig Dispense Refill   acyclovir (ZOVIRAX) 800 MG tablet Take 1 tablet (800 mg total) by mouth 2 (two) times daily as needed (flare up). 60 tablet 5   acyclovir ointment (ZOVIRAX) 5 % Apply 1 application. topically 4 (four) times daily as needed (outbreak).     albuterol (VENTOLIN HFA) 108 (90 Base) MCG/ACT inhaler Inhale 1 puff every 4 hours by inhalation route.     ibuprofen (ADVIL) 800 MG tablet Take 1 tablet (800 mg total) by mouth every 8 (eight) hours as needed for moderate pain. For AFTER surgery only (Patient not taking: Reported on 09/17/2021) 30 tablet 0   LORazepam (ATIVAN) 0.5 MG tablet Take 1 tablet (0.5 mg total) by mouth every 8 (eight) hours as needed for anxiety. 30 tablet 0   magnesium oxide (MAG-OX) 400 (240 Mg) MG tablet Take 1 tablet (400 mg total) by mouth daily. 30 tablet 3   ondansetron (ZOFRAN) 8 MG tablet Take 1 tablet (8 mg total) by mouth every 8 (eight) hours as needed for nausea or vomiting. 20 tablet 0   prochlorperazine (COMPAZINE) 10 MG tablet Take 1 tablet (10 mg total) by mouth every 6 (six) hours as needed for nausea or vomiting. 30 tablet 0   venlafaxine XR (EFFEXOR-XR) 150 MG 24 hr capsule Take 1 capsule (150 mg total) by mouth daily with breakfast. 30 capsule 6   VYVANSE 50 MG capsule Take 50 mg by mouth daily.     No current facility-administered medications for this visit.    PHYSICAL EXAMINATION: ECOG PERFORMANCE STATUS: 1 - Symptomatic but completely ambulatory  Vitals:   10/07/21 1015  BP: 118/73  Pulse: 99  Resp: 18  Temp: 97.7 F (36.5 C)  SpO2: 100%   Filed Weights   10/07/21 1015  Weight: 146 lb 3.2 oz (66.3 kg)      LABORATORY DATA:  I have  reviewed the data as listed    Latest Ref Rng & Units 10/07/2021    9:55 AM 09/17/2021   12:08 PM 08/27/2021    9:35 AM  CMP  Glucose 70 - 99 mg/dL 86  109  103   BUN 6 - 20 mg/dL 8  14  16    Creatinine 0.44 - 1.00 mg/dL 0.66  0.77  0.75   Sodium 135 - 145 mmol/L 141  139  138   Potassium 3.5 - 5.1 mmol/L 3.6  3.7  4.2   Chloride 98 - 111 mmol/L 110  107  107   CO2 22 - 32 mmol/L 26  28  26    Calcium 8.9 - 10.3 mg/dL 8.9  8.9  9.5   Total Protein 6.5 - 8.1 g/dL 5.9  6.5  7.1   Total Bilirubin 0.3 - 1.2 mg/dL 0.3  0.2  0.4   Alkaline Phos 38 - 126 U/L 52  51  66   AST 15 - 41 U/L 14  15  13    ALT 0 - 44 U/L 15  17  13      Lab Results  Component Value Date  WBC 4.2 10/07/2021   HGB 11.6 (L) 10/07/2021   HCT 33.9 (L) 10/07/2021   MCV 87.4 10/07/2021   PLT 318 10/07/2021   NEUTROABS 2.3 10/07/2021    ASSESSMENT & PLAN:  Carcinoma of breast metastatic to bone ([place]) 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, FISH positive ratio 2.8, Ki-67 25%, lymph node positive   Palliative radiation completed 09/03/2020 Taxotere/Herceptin/Perjeta 08/31/2020-12/18/2020 Herceptin Perjeta Maintenance  beginning 01/08/2021-08/27/2021     PET/CT 07/07/2021: Interval decrease in the left breast mass having an SUV of 2.7, small nodule left breast SUV 5.6, left axillary lymph node SUV 3.35, liver metastases no longer visible.  Sclerosis involving bone metastases.  New compression fracture L3 vertebral spine no sclerotic  Biopsy of the breast at [facility] on 08/26/2021: IDC with lobular features ER 90%, PR 0%, HER2 1+ (low) ------------------------------------------------------------------------------------------------------------------------------------ Current treatment: Enhertu started 09/17/2021 s/p BSO, today is cycle 2 Enhertu  toxicities: Fatigue: Could be related to Enhertu versus depression Nausea for 1 week  Major depression: Patient is meeting with Ellen [name] on Thursday this week.  She might need to see a counselor on a regular basis. Return to clinic every 3 weeks for Enhertu.    No orders of the defined types were placed in this encounter.  The patient has a good understanding of the overall plan. she agrees with it. she will call with any problems that may develop before the next visit here. Total time spent: 30 mins including face to face time and time spent for planning, charting and co-ordination of care   Harriette Ohara, MD 10/07/21    I Gardiner Coins am scribing for Dr. Lindi Adie  I have reviewed the above documentation for accuracy and completeness, and I agree with the above.

## 2021-10-04 MED FILL — Dexamethasone Sodium Phosphate Inj 100 MG/10ML: INTRAMUSCULAR | Qty: 1 | Status: AC

## 2021-10-05 ENCOUNTER — Other Ambulatory Visit: Payer: Self-pay

## 2021-10-07 ENCOUNTER — Inpatient Hospital Stay (HOSPITAL_BASED_OUTPATIENT_CLINIC_OR_DEPARTMENT_OTHER): Payer: Medicaid Other | Admitting: Hematology and Oncology

## 2021-10-07 ENCOUNTER — Inpatient Hospital Stay: Payer: Medicaid Other

## 2021-10-07 ENCOUNTER — Other Ambulatory Visit: Payer: Self-pay

## 2021-10-07 VITALS — BP 116/83 | HR 70 | Temp 97.7°F | Resp 18

## 2021-10-07 DIAGNOSIS — C50912 Malignant neoplasm of unspecified site of left female breast: Secondary | ICD-10-CM

## 2021-10-07 DIAGNOSIS — C50919 Malignant neoplasm of unspecified site of unspecified female breast: Secondary | ICD-10-CM

## 2021-10-07 DIAGNOSIS — C7951 Secondary malignant neoplasm of bone: Secondary | ICD-10-CM | POA: Diagnosis not present

## 2021-10-07 DIAGNOSIS — Z5111 Encounter for antineoplastic chemotherapy: Secondary | ICD-10-CM | POA: Diagnosis not present

## 2021-10-07 LAB — COMPREHENSIVE METABOLIC PANEL
ALT: 15 U/L (ref 0–44)
AST: 14 U/L — ABNORMAL LOW (ref 15–41)
Albumin: 3.7 g/dL (ref 3.5–5.0)
Alkaline Phosphatase: 52 U/L (ref 38–126)
Anion gap: 5 (ref 5–15)
BUN: 8 mg/dL (ref 6–20)
CO2: 26 mmol/L (ref 22–32)
Calcium: 8.9 mg/dL (ref 8.9–10.3)
Chloride: 110 mmol/L (ref 98–111)
Creatinine, Ser: 0.66 mg/dL (ref 0.44–1.00)
GFR, Estimated: >60 mL/min (ref >60)
Glucose, Bld: 86 mg/dL (ref 70–99)
Potassium: 3.6 mmol/L (ref 3.5–5.1)
Sodium: 141 mmol/L (ref 135–145)
Total Bilirubin: 0.3 mg/dL (ref 0.3–1.2)
Total Protein: 5.9 g/dL — ABNORMAL LOW (ref 6.5–8.1)

## 2021-10-07 LAB — CBC WITH DIFFERENTIAL/PLATELET
Abs Immature Granulocytes: 0.01 10*3/uL (ref 0.00–0.07)
Basophils Absolute: 0 10*3/uL (ref 0.0–0.1)
Basophils Relative: 1 %
Eosinophils Absolute: 0.1 10*3/uL (ref 0.0–0.5)
Eosinophils Relative: 3 %
HCT: 33.9 % — ABNORMAL LOW (ref 36.0–46.0)
Hemoglobin: 11.6 g/dL — ABNORMAL LOW (ref 12.0–15.0)
Immature Granulocytes: 0 %
Lymphocytes Relative: 26 %
Lymphs Abs: 1.1 10*3/uL (ref 0.7–4.0)
MCH: 29.9 pg (ref 26.0–34.0)
MCHC: 34.2 g/dL (ref 30.0–36.0)
MCV: 87.4 fL (ref 80.0–100.0)
Monocytes Absolute: 0.7 10*3/uL (ref 0.1–1.0)
Monocytes Relative: 16 %
Neutro Abs: 2.3 10*3/uL (ref 1.7–7.7)
Neutrophils Relative %: 54 %
Platelets: 318 10*3/uL (ref 150–400)
RBC: 3.88 MIL/uL (ref 3.87–5.11)
RDW: 13.5 % (ref 11.5–15.5)
WBC: 4.2 10*3/uL (ref 4.0–10.5)
nRBC: 0 % (ref 0.0–0.2)

## 2021-10-07 MED ORDER — DIPHENHYDRAMINE HCL 25 MG PO CAPS
50.0000 mg | ORAL_CAPSULE | Freq: Once | ORAL | Status: AC
  Administered 2021-10-07: 50 mg via ORAL
  Filled 2021-10-07: qty 2

## 2021-10-07 MED ORDER — HEPARIN SOD (PORK) LOCK FLUSH 100 UNIT/ML IV SOLN
500.0000 [IU] | Freq: Once | INTRAVENOUS | Status: AC | PRN
  Administered 2021-10-07: 500 [IU]

## 2021-10-07 MED ORDER — DEXTROSE 5 % IV SOLN: Freq: Once | INTRAVENOUS | Status: AC

## 2021-10-07 MED ORDER — FAM-TRASTUZUMAB DERUXTECAN-NXKI CHEMO 100 MG IV SOLR
5.4000 mg/kg | Freq: Once | INTRAVENOUS | Status: AC
  Administered 2021-10-07: 356 mg via INTRAVENOUS
  Filled 2021-10-07: qty 17.8

## 2021-10-07 MED ORDER — SODIUM CHLORIDE 0.9 % IV SOLN
10.0000 mg | Freq: Once | INTRAVENOUS | Status: AC
  Administered 2021-10-07: 10 mg via INTRAVENOUS
  Filled 2021-10-07: qty 10

## 2021-10-07 MED ORDER — ACETAMINOPHEN 325 MG PO TABS
650.0000 mg | ORAL_TABLET | Freq: Once | ORAL | Status: AC
  Administered 2021-10-07: 650 mg via ORAL
  Filled 2021-10-07: qty 2

## 2021-10-07 MED ORDER — PALONOSETRON HCL INJECTION 0.25 MG/5ML
0.2500 mg | Freq: Once | INTRAVENOUS | Status: AC
  Administered 2021-10-07: 0.25 mg via INTRAVENOUS
  Filled 2021-10-07: qty 5

## 2021-10-07 MED ORDER — SODIUM CHLORIDE 0.9% FLUSH
10.0000 mL | INTRAVENOUS | Status: DC | PRN
  Administered 2021-10-07: 10 mL

## 2021-10-07 NOTE — Assessment & Plan Note (Signed)
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, ER 30%, PR 20%, HER2 equivocal by IHC, FISH positiveratio 2.8, Ki-67 25%, lymph node positive  Palliative radiation completed 09/03/2020 Taxotere/Herceptin/Perjeta 08/31/2020-12/18/2020 Herceptin Perjeta Maintenance beginning 01/08/2021-08/27/2021   PET/CT 07/07/2021: Interval decrease in the left breast mass having an SUV of 2.7, small nodule left breast SUV 5.6, left axillary lymph node SUV 3.35, liver metastases no longer visible. Sclerosis involving bone metastases. New compression fracture L3 vertebral spine no sclerotic  Biopsy of the breast at [facility] on 08/26/2021: IDC with lobular features ER 90%, PR 0%, HER2 1+ (low) ------------------------------------------------------------------------------------------------------------------------------------ Current treatment: Enhertu started 8/1/2023s/p BSO, today is cycle 2 Enhertu toxicities:  Return to clinic every 3 weeks for Enhertu. 

## 2021-10-07 NOTE — Patient Instructions (Signed)
[facility] ONCOLOGY  Discharge Instructions: Thank you for choosing [facility] to provide your oncology and hematology care.   If you have a lab appointment with the [facility], please go directly to the [facility] and check in at the registration area.   Wear comfortable clothing and clothing appropriate for easy access to any Portacath or PICC line.   We strive to give you quality time with your provider. You may need to reschedule your appointment if you arrive late (15 or more minutes).  Arriving late affects you and other patients whose appointments are after yours.  Also, if you miss three or more appointments without notifying the office, you may be dismissed from the clinic at the provider's discretion.      For prescription refill requests, have your pharmacy contact our office and allow 72 hours for refills to be completed.    Today you received the following chemotherapy and/or immunotherapy agents: Enhertu.      To help prevent nausea and vomiting after your treatment, we encourage you to take your nausea medication as directed.  BELOW ARE SYMPTOMS THAT SHOULD BE REPORTED IMMEDIATELY: *FEVER GREATER THAN 100.4 F (38 C) OR HIGHER *CHILLS OR SWEATING *NAUSEA AND VOMITING THAT IS NOT CONTROLLED WITH YOUR NAUSEA MEDICATION *UNUSUAL SHORTNESS OF BREATH *UNUSUAL BRUISING OR BLEEDING *URINARY PROBLEMS (pain or burning when urinating, or frequent urination) *BOWEL PROBLEMS (unusual diarrhea, constipation, pain near the anus) TENDERNESS IN MOUTH AND THROAT WITH OR WITHOUT PRESENCE OF ULCERS (sore throat, sores in mouth, or a toothache) UNUSUAL RASH, SWELLING OR PAIN  UNUSUAL VAGINAL DISCHARGE OR ITCHING   Items with * indicate a potential emergency and should be followed up as soon as possible or go to the Emergency Department if any problems should occur.  Please show the CHEMOTHERAPY ALERT CARD or IMMUNOTHERAPY ALERT CARD at check-in to  the Emergency Department and triage nurse.  Should you have questions after your visit or need to cancel or reschedule your appointment, please contact [facility]  Dept: [phone]  and follow the prompts.  Office hours are 8:00 a.m. to 4:30 p.m. Monday - Friday. Please note that voicemails left after 4:00 p.m. may not be returned until the following business day.  We are closed weekends and major holidays. You have access to a nurse at all times for urgent questions. Please call the main number to the clinic Dept: [phone] and follow the prompts.   For any non-urgent questions, you may also contact your provider using MyChart. We now offer e-Visits for anyone 35 and older to request care online for non-urgent symptoms. For details visit [email or web address].   Also download the MyChart app! Go to the app store, search "MyChart", open the app, select [facility], and log in with your MyChart username and password.  Masks are optional in the cancer centers. If you would like for your care team to wear a mask while they are taking care of you, please let them know. You may have one support person who is at least 27 years old accompany you for your appointments. Fam-Trastuzumab Deruxtecan Injection What is this medication? FAM-TRASTUZUMAB DERUXTECAN (fam-tras TOOZ eu mab DER ux TEE kan) treats some types of cancer. It works by blocking a protein that causes cancer cells to grow and multiply. This helps to slow or stop the spread of cancer cells. This medicine may be used for other purposes; ask your health care provider or pharmacist  if you have questions. COMMON BRAND NAME(S): ENHERTU What should I tell my care team before I take this medication? They need to know if you have any of these conditions: Heart disease Heart failure Infection, especially a viral infection, such as chickenpox, cold sores, or herpes Liver disease Lung or breathing disease, such as  asthma or COPD An unusual or allergic reaction to fam-trastuzumab deruxtecan, other medications, foods, dyes, or preservatives Pregnant or trying to get pregnant Breast-feeding How should I use this medication? This medication is injected into a vein. It is given by your care team in a hospital or clinic setting. A special MedGuide will be given to you before each treatment. Be sure to read this information carefully each time. Talk to your care team about the use of this medication in children. Special care may be needed. Overdosage: If you think you have taken too much of this medicine contact a poison control center or emergency room at once. NOTE: This medicine is only for you. Do not share this medicine with others. What if I miss a dose? It is important not to miss your dose. Call your care team if you are unable to keep an appointment. What may interact with this medication? Interactions are not expected. This list may not describe all possible interactions. Give your health care provider a list of all the medicines, herbs, non-prescription drugs, or dietary supplements you use. Also tell them if you smoke, drink alcohol, or use illegal drugs. Some items may interact with your medicine. What should I watch for while using this medication? Visit your care team for regular checks on your progress. Tell your care team if your symptoms do not start to get better or if they get worse. Your condition will be monitored carefully while you are receiving this medication. Do not become pregnant while taking this medication or for 7 months after stopping it. Women should inform their care team if they wish to become pregnant or think they might be pregnant. Men should not father a child while taking this medication and for 4 months after stopping it. There is potential for serious side effects to an unborn child. Talk to your care team for more information. Do not breast-feed an infant while taking  this medication or for 7 months after the last dose. This medication has caused decreased sperm counts in some men. This may make it more difficult to father a child. Talk to your care team if you are concerned about your fertility. This medication may increase your risk to bruise or bleed. Call your care team if you notice any unusual bleeding. Be careful brushing or flossing your teeth or using a toothpick because you may get an infection or bleed more easily. If you have any dental work done, tell your dentist you are receiving this medication. This medication may cause dry eyes and blurred vision. If you wear contact lenses, you may feel some discomfort. Lubricating eye drops may help. See your care team if the problem does not go away or is severe. This medication may increase your risk of getting an infection. Call your care team for advice if you get a fever, chills, sore throat, or other symptoms of a cold or flu. Do not treat yourself. Try to avoid being around people who are sick. Avoid taking medications that contain aspirin, acetaminophen, ibuprofen, naproxen, or ketoprofen unless instructed by your care team. These medications may hide a fever. What side effects may I notice from receiving  this medication? Side effects that you should report to your care team as soon as possible: Allergic reactions--skin rash, itching, hives, swelling of the face, lips, tongue, or throat Dry cough, shortness of breath or trouble breathing Infection--fever, chills, cough, sore throat, wounds that don't heal, pain or trouble when passing urine, general feeling of discomfort or being unwell Heart failure--shortness of breath, swelling of the ankles, feet, or hands, sudden weight gain, unusual weakness or fatigue Unusual bruising or bleeding Side effects that usually do not require medical attention (report these to your care team if they continue or are bothersome): Constipation Diarrhea Hair loss Muscle  pain Nausea Vomiting This list may not describe all possible side effects. Call your doctor for medical advice about side effects. You may report side effects to FDA at 1-800-FDA-1088. Where should I keep my medication? This medication is given in a hospital or clinic. It will not be stored at home. NOTE: This sheet is a summary. It may not cover all possible information. If you have questions about this medicine, talk to your doctor, pharmacist, or health care provider.  2023 Elsevier/Gold Standard (2020-11-20 00:00:00)

## 2021-10-15 ENCOUNTER — Ambulatory Visit: Payer: Self-pay

## 2021-10-15 ENCOUNTER — Ambulatory Visit: Payer: Self-pay | Admitting: Adult Health

## 2021-10-15 ENCOUNTER — Other Ambulatory Visit: Payer: Self-pay

## 2021-10-18 ENCOUNTER — Ambulatory Visit (HOSPITAL_COMMUNITY)
Admission: RE | Admit: 2021-10-18 | Discharge: 2021-10-18 | Disposition: A | Discharge status: Home / Self Care | Payer: Self-pay | Source: Ambulatory Visit | Attending: Adult Health | Admitting: Adult Health

## 2021-10-18 DIAGNOSIS — C50919 Malignant neoplasm of unspecified site of unspecified female breast: Secondary | ICD-10-CM | POA: Insufficient documentation

## 2021-10-18 DIAGNOSIS — Z0189 Encounter for other specified special examinations: Secondary | ICD-10-CM

## 2021-10-18 LAB — ECHOCARDIOGRAM COMPLETE
Area-P 1/2: 4.07 cm2
S' Lateral: 2.3 cm
Single Plane A4C EF: 67.2 %

## 2021-10-18 NOTE — Progress Notes (Signed)
  Echocardiogram 2D Echocardiogram has been performed.  Ellen [name] 10/18/2021, 9:56 AM

## 2021-10-22 ENCOUNTER — Ambulatory Visit: Payer: Medicaid Other | Admitting: Adult Health

## 2021-10-22 ENCOUNTER — Encounter: Payer: Self-pay | Admitting: Hematology and Oncology

## 2021-10-22 ENCOUNTER — Ambulatory Visit: Payer: Self-pay

## 2021-10-22 ENCOUNTER — Other Ambulatory Visit: Payer: Self-pay | Admitting: Hematology and Oncology

## 2021-10-22 ENCOUNTER — Other Ambulatory Visit: Payer: Medicaid Other

## 2021-10-22 DIAGNOSIS — C50919 Malignant neoplasm of unspecified site of unspecified female breast: Secondary | ICD-10-CM

## 2021-10-22 DIAGNOSIS — C50912 Malignant neoplasm of unspecified site of left female breast: Secondary | ICD-10-CM

## 2021-10-23 ENCOUNTER — Ambulatory Visit: Payer: Medicaid Other | Admitting: Adult Health

## 2021-10-23 ENCOUNTER — Other Ambulatory Visit: Payer: Medicaid Other

## 2021-10-23 ENCOUNTER — Telehealth: Payer: Self-pay | Admitting: Hematology and Oncology

## 2021-10-23 ENCOUNTER — Ambulatory Visit: Payer: Medicaid Other

## 2021-10-23 NOTE — Telephone Encounter (Signed)
Scheduled appointment per WQ. Left voicemail. 

## 2021-10-25 MED FILL — Dexamethasone Sodium Phosphate Inj 100 MG/10ML: INTRAMUSCULAR | Qty: 1 | Status: AC

## 2021-10-27 ENCOUNTER — Encounter: Payer: Self-pay | Admitting: Adult Health

## 2021-10-28 ENCOUNTER — Inpatient Hospital Stay: Payer: Self-pay

## 2021-10-28 ENCOUNTER — Inpatient Hospital Stay: Payer: Self-pay | Admitting: Adult Health

## 2021-10-29 ENCOUNTER — Inpatient Hospital Stay: Payer: Self-pay | Attending: Adult Health

## 2021-10-29 ENCOUNTER — Inpatient Hospital Stay: Payer: Self-pay

## 2021-10-29 ENCOUNTER — Other Ambulatory Visit: Payer: Self-pay

## 2021-10-29 ENCOUNTER — Inpatient Hospital Stay (HOSPITAL_BASED_OUTPATIENT_CLINIC_OR_DEPARTMENT_OTHER): Payer: Self-pay | Admitting: Adult Health

## 2021-10-29 ENCOUNTER — Encounter: Payer: Self-pay | Admitting: Hematology and Oncology

## 2021-10-29 VITALS — HR 80

## 2021-10-29 DIAGNOSIS — Z95828 Presence of other vascular implants and grafts: Secondary | ICD-10-CM

## 2021-10-29 DIAGNOSIS — C787 Secondary malignant neoplasm of liver and intrahepatic bile duct: Secondary | ICD-10-CM | POA: Insufficient documentation

## 2021-10-29 DIAGNOSIS — C50919 Malignant neoplasm of unspecified site of unspecified female breast: Secondary | ICD-10-CM

## 2021-10-29 DIAGNOSIS — J4520 Mild intermittent asthma, uncomplicated: Secondary | ICD-10-CM | POA: Insufficient documentation

## 2021-10-29 DIAGNOSIS — C50912 Malignant neoplasm of unspecified site of left female breast: Secondary | ICD-10-CM

## 2021-10-29 DIAGNOSIS — C7951 Secondary malignant neoplasm of bone: Secondary | ICD-10-CM

## 2021-10-29 DIAGNOSIS — Z5111 Encounter for antineoplastic chemotherapy: Secondary | ICD-10-CM | POA: Insufficient documentation

## 2021-10-29 DIAGNOSIS — Z803 Family history of malignant neoplasm of breast: Secondary | ICD-10-CM | POA: Insufficient documentation

## 2021-10-29 DIAGNOSIS — Z800 Family history of malignant neoplasm of digestive organs: Secondary | ICD-10-CM | POA: Insufficient documentation

## 2021-10-29 DIAGNOSIS — Z170 Estrogen receptor positive status [ER+]: Secondary | ICD-10-CM | POA: Insufficient documentation

## 2021-10-29 DIAGNOSIS — N179 Acute kidney failure, unspecified: Secondary | ICD-10-CM | POA: Insufficient documentation

## 2021-10-29 LAB — CBC WITH DIFFERENTIAL (CANCER CENTER ONLY)
Abs Immature Granulocytes: 0.01 10*3/uL (ref 0.00–0.07)
Basophils Absolute: 0 10*3/uL (ref 0.0–0.1)
Basophils Relative: 0 %
Eosinophils Absolute: 0.1 10*3/uL (ref 0.0–0.5)
Eosinophils Relative: 3 %
HCT: 37.8 % (ref 36.0–46.0)
Hemoglobin: 12.6 g/dL (ref 12.0–15.0)
Immature Granulocytes: 0 %
Lymphocytes Relative: 43 %
Lymphs Abs: 2 10*3/uL (ref 0.7–4.0)
MCH: 30.1 pg (ref 26.0–34.0)
MCHC: 33.3 g/dL (ref 30.0–36.0)
MCV: 90.4 fL (ref 80.0–100.0)
Monocytes Absolute: 0.6 10*3/uL (ref 0.1–1.0)
Monocytes Relative: 13 %
Neutro Abs: 1.9 10*3/uL (ref 1.7–7.7)
Neutrophils Relative %: 41 %
Platelet Count: 280 10*3/uL (ref 150–400)
RBC: 4.18 MIL/uL (ref 3.87–5.11)
RDW: 13.3 % (ref 11.5–15.5)
WBC Count: 4.7 10*3/uL (ref 4.0–10.5)
nRBC: 0 % (ref 0.0–0.2)

## 2021-10-29 LAB — CMP (CANCER CENTER ONLY)
ALT: 20 U/L (ref 0–44)
AST: 23 U/L (ref 15–41)
Albumin: 4.2 g/dL (ref 3.5–5.0)
Alkaline Phosphatase: 65 U/L (ref 38–126)
Anion gap: 4 — ABNORMAL LOW (ref 5–15)
BUN: 8 mg/dL (ref 6–20)
CO2: 28 mmol/L (ref 22–32)
Calcium: 9.5 mg/dL (ref 8.9–10.3)
Chloride: 107 mmol/L (ref 98–111)
Creatinine: 0.9 mg/dL (ref 0.44–1.00)
GFR, Estimated: >60 mL/min (ref >60)
Glucose, Bld: 114 mg/dL — ABNORMAL HIGH (ref 70–99)
Potassium: 4.4 mmol/L (ref 3.5–5.1)
Sodium: 139 mmol/L (ref 135–145)
Total Bilirubin: 0.4 mg/dL (ref 0.3–1.2)
Total Protein: 7.2 g/dL (ref 6.5–8.1)

## 2021-10-29 LAB — BASIC METABOLIC PANEL
Anion gap: 5 (ref 5–15)
BUN: 8 mg/dL (ref 6–20)
CO2: 28 mmol/L (ref 22–32)
Calcium: 9.5 mg/dL (ref 8.9–10.3)
Chloride: 106 mmol/L (ref 98–111)
Creatinine, Ser: 0.89 mg/dL (ref 0.44–1.00)
GFR, Estimated: >60 mL/min (ref >60)
Glucose, Bld: 116 mg/dL — ABNORMAL HIGH (ref 70–99)
Potassium: 4.5 mmol/L (ref 3.5–5.1)
Sodium: 139 mmol/L (ref 135–145)

## 2021-10-29 MED ORDER — FAM-TRASTUZUMAB DERUXTECAN-NXKI CHEMO 100 MG IV SOLR
5.4000 mg/kg | Freq: Once | INTRAVENOUS | Status: AC
  Administered 2021-10-29: 358 mg via INTRAVENOUS
  Filled 2021-10-29: qty 17.9

## 2021-10-29 MED ORDER — SODIUM CHLORIDE 0.9 % IV SOLN
10.0000 mg | Freq: Once | INTRAVENOUS | Status: AC
  Administered 2021-10-29: 10 mg via INTRAVENOUS
  Filled 2021-10-29: qty 10

## 2021-10-29 MED ORDER — HEPARIN SOD (PORK) LOCK FLUSH 100 UNIT/ML IV SOLN
500.0000 [IU] | Freq: Once | INTRAVENOUS | Status: AC | PRN
  Administered 2021-10-29: 500 [IU]

## 2021-10-29 MED ORDER — SODIUM CHLORIDE 0.9% FLUSH
10.0000 mL | Freq: Once | INTRAVENOUS | Status: AC
  Administered 2021-10-29: 10 mL

## 2021-10-29 MED ORDER — PALONOSETRON HCL INJECTION 0.25 MG/5ML
0.2500 mg | Freq: Once | INTRAVENOUS | Status: AC
  Administered 2021-10-29: 0.25 mg via INTRAVENOUS
  Filled 2021-10-29: qty 5

## 2021-10-29 MED ORDER — SODIUM CHLORIDE 0.9% FLUSH
10.0000 mL | INTRAVENOUS | Status: DC | PRN
  Administered 2021-10-29: 10 mL

## 2021-10-29 MED ORDER — DENOSUMAB 120 MG/1.7ML ~~LOC~~ SOLN
120.0000 mg | Freq: Once | SUBCUTANEOUS | Status: AC
  Administered 2021-10-29: 120 mg via SUBCUTANEOUS
  Filled 2021-10-29: qty 1.7

## 2021-10-29 MED ORDER — ACETAMINOPHEN 325 MG PO TABS
650.0000 mg | ORAL_TABLET | Freq: Once | ORAL | Status: AC
  Administered 2021-10-29: 650 mg via ORAL
  Filled 2021-10-29: qty 2

## 2021-10-29 MED ORDER — DIPHENHYDRAMINE HCL 25 MG PO CAPS
50.0000 mg | ORAL_CAPSULE | Freq: Once | ORAL | Status: AC
  Administered 2021-10-29: 50 mg via ORAL
  Filled 2021-10-29: qty 2

## 2021-10-29 MED ORDER — DEXTROSE 5 % IV SOLN: Freq: Once | INTRAVENOUS | Status: AC

## 2021-10-29 MED ORDER — ALTEPLASE 2 MG IJ SOLR
2.0000 mg | Freq: Once | INTRAMUSCULAR | Status: AC
  Administered 2021-10-29: 2 mg
  Filled 2021-10-29: qty 2

## 2021-10-29 NOTE — Patient Instructions (Signed)
[facility]  Discharge Instructions: Thank you for choosing [facility] to provide your oncology and hematology care.   If you have a lab appointment with the [facility], please go directly to the [facility] and check in at the registration area.   Wear comfortable clothing and clothing appropriate for easy access to any Portacath or PICC line.   We strive to give you quality time with your provider. You may need to reschedule your appointment if you arrive late (15 or more minutes).  Arriving late affects you and other patients whose appointments are after yours.  Also, if you miss three or more appointments without notifying the office, you may be dismissed from the clinic at the provider's discretion.      For prescription refill requests, have your pharmacy contact our office and allow 72 hours for refills to be completed.    Today you received the following chemotherapy and/or immunotherapy agents: Enhertu.      To help prevent nausea and vomiting after your treatment, we encourage you to take your nausea medication as directed.  BELOW ARE SYMPTOMS THAT SHOULD BE REPORTED IMMEDIATELY: *FEVER GREATER THAN 100.4 F (38 C) OR HIGHER *CHILLS OR SWEATING *NAUSEA AND VOMITING THAT IS NOT CONTROLLED WITH YOUR NAUSEA MEDICATION *UNUSUAL SHORTNESS OF BREATH *UNUSUAL BRUISING OR BLEEDING *URINARY PROBLEMS (pain or burning when urinating, or frequent urination) *BOWEL PROBLEMS (unusual diarrhea, constipation, pain near the anus) TENDERNESS IN MOUTH AND THROAT WITH OR WITHOUT PRESENCE OF ULCERS (sore throat, sores in mouth, or a toothache) UNUSUAL RASH, SWELLING OR PAIN  UNUSUAL VAGINAL DISCHARGE OR ITCHING   Items with * indicate a potential emergency and should be followed up as soon as possible or go to the Emergency Department if any problems should occur.  Please show the CHEMOTHERAPY ALERT CARD or IMMUNOTHERAPY ALERT CARD at check-in to  the Emergency Department and triage nurse.  Should you have questions after your visit or need to cancel or reschedule your appointment, please contact [facility]  Dept: [phone]  and follow the prompts.  Office hours are 8:00 a.m. to 4:30 p.m. Monday - Friday. Please note that voicemails left after 4:00 p.m. may not be returned until the following business day.  We are closed weekends and major holidays. You have access to a nurse at all times for urgent questions. Please call the main number to the clinic Dept: [phone] and follow the prompts.   For any non-urgent questions, you may also contact your provider using MyChart. We now offer e-Visits for anyone 27 and older to request care online for non-urgent symptoms. For details visit [email or web address].   Also download the MyChart app! Go to the app store, search "MyChart", open the app, select [facility], and log in with your MyChart username and password.  Masks are optional in the cancer centers. If you would like for your care team to wear a mask while they are taking care of you, please let them know. You may have one support person who is at least 27 years old accompany you for your appointments. Fam-Trastuzumab Deruxtecan Injection What is this medication? FAM-TRASTUZUMAB DERUXTECAN (fam-tras TOOZ eu mab DER ux TEE kan) treats some types of cancer. It works by blocking a protein that causes cancer cells to grow and multiply. This helps to slow or stop the spread of cancer cells. This medicine may be used for other purposes; ask your health care provider or pharmacist  if you have questions. COMMON BRAND NAME(S): ENHERTU What should I tell my care team before I take this medication? They need to know if you have any of these conditions: Heart disease Heart failure Infection, especially a viral infection, such as chickenpox, cold sores, or herpes Liver disease Lung or breathing disease, such as  asthma or COPD An unusual or allergic reaction to fam-trastuzumab deruxtecan, other medications, foods, dyes, or preservatives Pregnant or trying to get pregnant Breast-feeding How should I use this medication? This medication is injected into a vein. It is given by your care team in a hospital or clinic setting. A special MedGuide will be given to you before each treatment. Be sure to read this information carefully each time. Talk to your care team about the use of this medication in children. Special care may be needed. Overdosage: If you think you have taken too much of this medicine contact a poison control center or emergency room at once. NOTE: This medicine is only for you. Do not share this medicine with others. What if I miss a dose? It is important not to miss your dose. Call your care team if you are unable to keep an appointment. What may interact with this medication? Interactions are not expected. This list may not describe all possible interactions. Give your health care provider a list of all the medicines, herbs, non-prescription drugs, or dietary supplements you use. Also tell them if you smoke, drink alcohol, or use illegal drugs. Some items may interact with your medicine. What should I watch for while using this medication? Visit your care team for regular checks on your progress. Tell your care team if your symptoms do not start to get better or if they get worse. Your condition will be monitored carefully while you are receiving this medication. Do not become pregnant while taking this medication or for 7 months after stopping it. Women should inform their care team if they wish to become pregnant or think they might be pregnant. Men should not father a child while taking this medication and for 4 months after stopping it. There is potential for serious side effects to an unborn child. Talk to your care team for more information. Do not breast-feed an infant while taking  this medication or for 7 months after the last dose. This medication has caused decreased sperm counts in some men. This may make it more difficult to father a child. Talk to your care team if you are concerned about your fertility. This medication may increase your risk to bruise or bleed. Call your care team if you notice any unusual bleeding. Be careful brushing or flossing your teeth or using a toothpick because you may get an infection or bleed more easily. If you have any dental work done, tell your dentist you are receiving this medication. This medication may cause dry eyes and blurred vision. If you wear contact lenses, you may feel some discomfort. Lubricating eye drops may help. See your care team if the problem does not go away or is severe. This medication may increase your risk of getting an infection. Call your care team for advice if you get a fever, chills, sore throat, or other symptoms of a cold or flu. Do not treat yourself. Try to avoid being around people who are sick. Avoid taking medications that contain aspirin, acetaminophen, ibuprofen, naproxen, or ketoprofen unless instructed by your care team. These medications may hide a fever. What side effects may I notice from receiving  this medication? Side effects that you should report to your care team as soon as possible: Allergic reactions--skin rash, itching, hives, swelling of the face, lips, tongue, or throat Dry cough, shortness of breath or trouble breathing Infection--fever, chills, cough, sore throat, wounds that don't heal, pain or trouble when passing urine, general feeling of discomfort or being unwell Heart failure--shortness of breath, swelling of the ankles, feet, or hands, sudden weight gain, unusual weakness or fatigue Unusual bruising or bleeding Side effects that usually do not require medical attention (report these to your care team if they continue or are bothersome): Constipation Diarrhea Hair loss Muscle  pain Nausea Vomiting This list may not describe all possible side effects. Call your doctor for medical advice about side effects. You may report side effects to FDA at 1-800-FDA-1088. Where should I keep my medication? This medication is given in a hospital or clinic. It will not be stored at home. NOTE: This sheet is a summary. It may not cover all possible information. If you have questions about this medicine, talk to your doctor, pharmacist, or health care provider.  2023 Elsevier/Gold Standard (2020-11-20 00:00:00)

## 2021-10-30 ENCOUNTER — Encounter: Payer: Self-pay | Admitting: Adult Health

## 2021-10-30 ENCOUNTER — Encounter: Payer: Self-pay | Admitting: General Practice

## 2021-10-30 ENCOUNTER — Other Ambulatory Visit: Payer: Self-pay

## 2021-10-30 MED ORDER — PANTOPRAZOLE SODIUM 40 MG PO TBEC: 40.0000 mg | DELAYED_RELEASE_TABLET | Freq: Every day | ORAL | 3 refills | Status: DC

## 2021-10-30 NOTE — Progress Notes (Signed)
[facility] Spiritual Care Note  Left voicemail encouraging return call.   Chaplain Sosaia Pittinger, MDiv, [facility] Pager [phone] Voicemail [phone]  

## 2021-10-30 NOTE — Addendum Note (Signed)
Addended by: Juanetta Gosling on: 10/30/2021 11:58 AM   Modules accepted: Orders

## 2021-10-30 NOTE — Progress Notes (Signed)
Patient is receiving Replacement Medication. Medication: Delton See Manufacturer: [employer] Approval Dates: Approved from 10/30/2021 until 10/30/2022. ID: [id number] Reason: self pay First DOS: 10/29/2021

## 2021-11-01 ENCOUNTER — Encounter: Payer: Self-pay | Admitting: Hematology and Oncology

## 2021-11-01 NOTE — Assessment & Plan Note (Signed)
Ellen [name] is a 26 year old woman with metastatic breast cancer on treatment with Enhertu.  Her labs are stable and she will proceed with treatment today.    We spent a great deal of her appointment today talking about her mental health and difficulty with her cancer, and challenges at home.  She is connected with a therapist and will see them next week.  This will be good for her.  She is taking Effexor 150 mg daily.    We discussed that we will need to change her zometa to an Xgeva injection.  She understands this.    We will see [name] back in 3 weeks for labs, f/u, and her next appointment.

## 2021-11-01 NOTE — Progress Notes (Signed)
Ellen [name] Follow up:    Pcp, No No address on file   DIAGNOSIS:  [name] Staging  Carcinoma of breast metastatic to bone [facility]) Staging form: Breast, AJCC 8th Edition - Clinical stage from 09/03/2020: Stage IV (cT4b, cN1, cM1, G2, ER+, PR+, HER2+) - Signed by Ellen Phlegm, NP on 10/03/2020 Stage prefix: Initial diagnosis Method of lymph node assessment: Clinical Histologic grading system: 3 grade system   SUMMARY OF ONCOLOGIC HISTORY: Oncology History  Primary malignant neoplasm of breast with metastasis ([place])  08/15/2020 Imaging   Large mass in the medial left breast measuring 3.5 cm.  Prominent left axillary lymph node cluster of adjacent lymph nodes, irregular nodule right middle lobe 1.1 cm, aggressive lesion right second rib cortical destruction, multiple lucent lesions throughout the thoracic spine including T1 vertebral body and T2, irregular multilobar lesion central left hepatic lobe 4.3 x 4.7 cm.  Several satellite lesions in the left lateral hepatic lobe.  Multiple sclerotic lesions in the bones iliac wings, acetabular, medial iliac bones, sacrum multiple lesions lumbar spine L3-L4 and L5   08/17/2020 Initial Diagnosis   Biopsy revealed IDC with DCIS grade 2, ER 30%, PR 20%, HER2 equivocal by IHC, FISH positive, Ki-67 25%, lymph node positive    Genetic Testing   Negative genetic testing:  No pathogenic variants detected on the [employer] CancerNext-Expanded + RNAinsight panel. The report date is 09/06/2020.   The CancerNext-Expanded + RNAinsight gene panel offered by [employer] and includes sequencing and rearrangement analysis for the following 77 genes: AIP, ALK, APC, ATM, AXIN2, BAP1, BARD1, BLM, BMPR1A, BRCA1, BRCA2, BRIP1, CDC73, CDH1, CDK4, CDKN1B, CDKN2A, CHEK2, CTNNA1, DICER1, FANCC, FH, FLCN, GALNT12, KIF1B, LZTR1, MAX, MEN1, MET, MLH1, MSH2, MSH3, MSH6, MUTYH, NBN, NF1, NF2, NTHL1, PALB2, PHOX2B, PMS2, POT1, PRKAR1A, PTCH1, PTEN, RAD51C,  RAD51D, RB1, RECQL, RET, SDHA, SDHAF2, SDHB, SDHC, SDHD, SMAD4, SMARCA4, SMARCB1, SMARCE1, STK11, SUFU, TMEM127, TP53, TSC1, TSC2, VHL and XRCC2 (sequencing and deletion/duplication); EGFR, EGLN1, HOXB13, KIT, MITF, PDGFRA, POLD1 and POLE (sequencing only); EPCAM and GREM1 (deletion/duplication only). RNA data is routinely analyzed for use in variant interpretation for all genes.   09/17/2021 - 10/07/2021 Chemotherapy   Patient is on Treatment Plan : BREAST METASTATIC fam-trastuzumab deruxtecan-nxki (Enhertu) q21d     09/17/2021 -  Chemotherapy   Patient is on Treatment Plan : BREAST METASTATIC Fam-Trastuzumab Deruxtecan-nxki (Enhertu) (5.4) q21d     Carcinoma of breast metastatic to bone ([place])  08/17/2020 Initial Diagnosis   Carcinoma of breast metastatic to bone ([place]); Goserelin/Zoladex started and to be given every 4 weeks    Genetic Testing   Negative genetic testing:  No pathogenic variants detected on the [employer] CancerNext-Expanded + RNAinsight panel. The report date is 09/06/2020.   The CancerNext-Expanded + RNAinsight gene panel offered by [employer] and includes sequencing and rearrangement analysis for the following 77 genes: AIP, ALK, APC, ATM, AXIN2, BAP1, BARD1, BLM, BMPR1A, BRCA1, BRCA2, BRIP1, CDC73, CDH1, CDK4, CDKN1B, CDKN2A, CHEK2, CTNNA1, DICER1, FANCC, FH, FLCN, GALNT12, KIF1B, LZTR1, MAX, MEN1, MET, MLH1, MSH2, MSH3, MSH6, MUTYH, NBN, NF1, NF2, NTHL1, PALB2, PHOX2B, PMS2, POT1, PRKAR1A, PTCH1, PTEN, RAD51C, RAD51D, RB1, RECQL, RET, SDHA, SDHAF2, SDHB, SDHC, SDHD, SMAD4, SMARCA4, SMARCB1, SMARCE1, STK11, SUFU, TMEM127, TP53, TSC1, TSC2, VHL and XRCC2 (sequencing and deletion/duplication); EGFR, EGLN1, HOXB13, KIT, MITF, PDGFRA, POLD1 and POLE (sequencing only); EPCAM and GREM1 (deletion/duplication only). RNA data is routinely analyzed for use in variant interpretation for all genes.   08/18/2020 - 08/30/2020 Radiation Therapy  Palliative radiation to the lumbar and pelvic bone; 30 Gy  in 10 fractions   08/31/2020 - 12/18/2020 Adjuvant Chemotherapy   Started with Herceptin/Perjeta on 08/31/2020 and Taxotere on 09/04/2020; she was hospitalized with dehydration and refractory diarrhea for 11 days, she resumed treatment with Taxotere (dose reduced by 10mg /m2) and Herceptin only (perjeta discontinued) on 09/26/2020, reintroduced 10/17/2020   09/03/2020 [name] Staging   Staging form: Breast, AJCC 8th Edition - Clinical stage from 09/03/2020: Stage IV (cT4b, cN1, cM1, G2, ER+, PR+, HER2+) - Signed by 09/05/2020, NP on 10/03/2020 Stage prefix: Initial diagnosis Method of lymph node assessment: Clinical Histologic grading system: 3 grade system   01/08/2021 - 08/27/2021 Adjuvant Chemotherapy   Herceptin/Perjeta every 3 weeks, Zometa every 12 weeks added 01/29/2021   07/07/2021 PET scan   1. Interval decrease in size of the dominant left breast mass which now exhibits mild low level FDG uptake with SUV max of 2.7. Small nodule within the left breast exhibits moderate tracer uptake within SUV max of 5.6 and is suspicious for residual/recurrent local tumor. 2. Mild tracer uptake associated with prominent left axillary lymph node with SUV max of 3.35. Residual tracer avid nodal metastasis not excluded. 3. The previously noted liver metastasis are no longer visible on the CT images from today's study. There is no abnormal increased uptake within the left hepatic lobe above background liver activity to suggest metabolically active tumor metastasis. 4. Interval increase in sclerosis associated with previously noted lytic bone metastasis without significant increased uptake above background bone marrow activity compatible with healing multifocal bone metastasis. 5. New pathologic fracture involves the L3 vertebral body where there was extensive lytic changes noted on the previous CT. The collapsed vertebral body is now sclerotic with mild low level FDG uptake. 6. Similar  appearance of soft tissue infiltration within the anterior mediastinal fat which exhibits mild FDG uptake on today's study. 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   07/16/2021 Surgery   S/p BSO, benign pathology    08/26/2021 Mammogram   Mammo diagnostic breast tomosynthesis left: There is an additional  irregular mass in the lower inner position at posterior depth. This was  further evaluated with ultrasound.   Mammo 10/27/2021 breast limited left: There is a 0.7 cm x 0.5 cm x 0.7 cm  irregularly shaped, hypoechoic mass with indistinct margins seen in the  left breast at 8 o'clock, 8 cm from the nipple. This mass is 1.6 cm medial  to the palpable mass as detailed above.    08/26/2021 Pathology Results   New left breast mass 8:00 biopsy: Invasive carcinoma with mixed ductal and lobular and focal micropapillary features.  Grade 3, invasive carcinoma measures 9 mm with an associated lymphoid reaction.  ER negative PR negative HER2 1+.  Left breast mass 2:00 biopsy: Invasive carcinoma with mixed ductal and lobular features Nottingham grade 2 measuring 1.4 cm.  ER 9% (low positive), PR negative, HER2 1+.   09/16/2021 PET scan   1. Interval progression of hypermetabolic metastatic disease. 2. Hypermetabolic bilateral axillary lymphadenopathy, subcarinal and bilateral hilar lymphadenopathy, new/progressive. 3. New hypermetabolic nodularity in the lower left breast is compatible with progressive malignancy. 4. New hypermetabolic mixed lytic and sclerotic left pelvic bone metastases as detailed. 5. Persistent patchy hypermetabolism in the anterior mediastinum associated with mixed soft tissue and fat density, favor thymic hyperplasia.   09/17/2021 - 10/07/2021 Chemotherapy   Patient is on Treatment Plan : BREAST METASTATIC fam-trastuzumab deruxtecan-nxki (Enhertu) q21d  09/17/2021 -  Chemotherapy   Patient is on Treatment Plan : BREAST METASTATIC Fam-Trastuzumab Deruxtecan-nxki  (Enhertu) (5.4) q21d       CURRENT THERAPY: Enhertu  INTERVAL HISTORY: Ellen [name] 26 y.o. female returns for f/u of her metastatic breast [name].  She is doing moderately well today.  Ellen [name] tells me that she is increasingly emotional.  She has experienced increased difficulty with dealing with her [name], what it means for her future, and how to make decisions for her life with the uncertainty that [name] brings her.  She is scheduled to see a counselor next week.  She also states that she is tolerating the Enhertu well, however feels her breast is getting worse where her [name] is.  She has a PET scan scheduled at [facility] in a couple of weeks.    Patient Active Problem List   Diagnosis Date Noted   Port-A-Cath in place 06/03/2021   AKI (acute kidney injury) ([place]) 09/09/2020   Genetic testing 09/06/2020   Family history of thyroid [name] 08/29/2020   Family history of pancreatic [name] 08/29/2020   Carcinoma of breast metastatic to bone ([place]) 08/17/2020   Primary malignant neoplasm of breast with metastasis ([place]) 08/16/2020   Left breast mass 08/13/2020   Genital herpes 08/13/2020   Attention deficit hyperactivity disorder (ADHD), 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   GAD (generalized anxiety disorder) 02/16/2017   Marijuana user 02/16/2017   Mild intermittent asthma without complication 17/61/6073   Severe episode of recurrent major depressive disorder, without psychotic features ([place]) 02/16/2017   Depression 04/20/2012    is allergic to other.  MEDICAL HISTORY: Past Medical History:  Diagnosis Date   ADHD (attention deficit hyperactivity disorder)    Anemia    Anxiety    Asthma    Exercise Induced   [name] ([place])    Depression    Family history of pancreatic [name]    Family history of thyroid [name]    GERD (gastroesophageal reflux disease)    Herpes simplex    Personal history of chemotherapy 08/2020    SURGICAL HISTORY: Past Surgical History:   Procedure Laterality Date   BREAST BIOPSY Left 08/2020   PORTACATH PLACEMENT Right 08/23/2020   Procedure: INSERTION PORT-A-CATH;  Surgeon: [name], MD;  Location: [facility];  Service: General;  Laterality: Right;   ROBOTIC ASSISTED BILATERAL SALPINGO OOPHERECTOMY Bilateral 07/16/2021   Procedure: XI ROBOTIC ASSISTED BILATERAL SALPINGO OOPHORECTOMY;  Surgeon: Lafonda Mosses, MD;  Location: [facility];  Service: Gynecology;  Laterality: Bilateral;    SOCIAL HISTORY: Social History   Socioeconomic History   Marital status: Single    Spouse name: Not on file   Number of children: Not on file   Years of education: Not on file   Highest education level: Not on file  Occupational History   Not on file  Tobacco Use   Smoking status: Never   Smokeless tobacco: Never  Vaping Use   Vaping Use: Every day   Substances: THC  Substance and Sexual Activity   Alcohol use: Not Currently    Comment: 3 to 4 a week   Drug use: Yes    Types: Marijuana    Comment: Daily   Sexual activity: Yes  Other Topics Concern   Not on file  Social History Narrative   Menarche at age 61, has a cycles every 28 days that lasts about 11 days, 4 days of spotting, two days of heavy flow, and the other days mild to  moderate flow.  Received Depo provera shot x 3 years, Nexplanon x 2 years, and currently has kyleena IUD.  No OCPs.  She has never been pregnant.    Social Determinants of Health   Financial Resource Strain: High Risk (05/08/2021)   Overall Financial Resource Strain (CARDIA)    Difficulty of Paying Living Expenses: Very hard  Food Insecurity: No Food Insecurity (08/13/2020)   Hunger Vital Sign    Worried About Running Out of Food in the Last Year: Never true    Ran Out of Food in the Last Year: Never true  Transportation Needs: No Transportation Needs (08/13/2020)   PRAPARE - Hydrologist (Medical): No    Lack of Transportation (Non-Medical): No   Physical Activity: Not on file  Stress: Not on file  Social Connections: Not on file  Intimate Partner Violence: Not on file    FAMILY HISTORY: Family History  Problem Relation Age of Onset   Depression Mother    Hyperlipidemia Mother    Thyroid [name] Mother 75       papillary thyroid [name]   Hypertension Father    Atrial fibrillation Father    Irritable bowel syndrome Father        also brother   ADD / ADHD Brother    Bipolar disorder Brother    Diabetes Maternal Grandfather    Heart attack Paternal Great-grandfather 89   Pancreatic [name] Maternal Great-grandfather 88       (MGM's father)   Colon [name] Neg Hx    Breast [name] Neg Hx    Ovarian [name] Neg Hx    Endometrial [name] Neg Hx    Prostate [name] Neg Hx     Review of Systems  Constitutional:  Negative for appetite change, chills, fatigue, fever and unexpected weight change.  HENT:   Negative for hearing loss, lump/mass and trouble swallowing.   Eyes:  Negative for eye problems and icterus.  Respiratory:  Negative for chest tightness, cough and shortness of breath.   Cardiovascular:  Negative for chest pain, leg swelling and palpitations.  Gastrointestinal:  Negative for abdominal distention, abdominal pain, constipation, diarrhea, nausea and vomiting.  Endocrine: Negative for hot flashes.  Genitourinary:  Negative for difficulty urinating.   Musculoskeletal:  Negative for arthralgias.  Skin:  Negative for itching and rash.  Neurological:  Negative for dizziness, extremity weakness, headaches and numbness.  Hematological:  Negative for adenopathy. Does not bruise/bleed easily.  Psychiatric/Behavioral:  Positive for depression. Negative for suicidal ideas. The patient is nervous/anxious.       PHYSICAL EXAMINATION  ECOG PERFORMANCE STATUS: 1 - Symptomatic but completely ambulatory  Vitals:   10/29/21 1519  BP: 109/74  Pulse: (!) 106  Resp: 18  Temp: 98.7 F (37.1 C)  SpO2: 100%    Physical  Exam Constitutional:      General: She is not in acute distress.    Appearance: Normal appearance. She is not toxic-appearing.  HENT:     Head: Normocephalic and atraumatic.  Eyes:     General: No scleral icterus. Cardiovascular:     Rate and Rhythm: Normal rate and regular rhythm.     Pulses: Normal pulses.     Heart sounds: Normal heart sounds.  Pulmonary:     Effort: Pulmonary effort is normal.     Breath sounds: Normal breath sounds.  Abdominal:     General: Abdomen is flat. Bowel sounds are normal. There is no distension.     Palpations:  Abdomen is soft.     Tenderness: There is no abdominal tenderness.  Musculoskeletal:        General: No swelling.     Cervical back: Neck supple.  Lymphadenopathy:     Cervical: No cervical adenopathy.  Skin:    General: Skin is warm and dry.     Findings: No rash.  Neurological:     General: No focal deficit present.     Mental Status: She is alert.  Psychiatric:        Mood and Affect: Mood normal.        Behavior: Behavior normal.     LABORATORY DATA:  CBC    Component Value Date/Time   WBC 4.7 10/29/2021 1453   WBC 4.2 10/07/2021 0955   RBC 4.18 10/29/2021 1453   HGB 12.6 10/29/2021 1453   HCT 37.8 10/29/2021 1453   PLT 280 10/29/2021 1453   MCV 90.4 10/29/2021 1453   MCH 30.1 10/29/2021 1453   MCHC 33.3 10/29/2021 1453   RDW 13.3 10/29/2021 1453   LYMPHSABS 2.0 10/29/2021 1453   MONOABS 0.6 10/29/2021 1453   EOSABS 0.1 10/29/2021 1453   BASOSABS 0.0 10/29/2021 1453    CMP     Component Value Date/Time   NA 139 10/29/2021 1453   K 4.4 10/29/2021 1453   CL 107 10/29/2021 1453   CO2 28 10/29/2021 1453   GLUCOSE 114 (H) 10/29/2021 1453   BUN 8 10/29/2021 1453   CREATININE 0.90 10/29/2021 1453   CREATININE 0.89 10/29/2021 1451   CALCIUM 9.5 10/29/2021 1453   PROT 7.2 10/29/2021 1453   ALBUMIN 4.2 10/29/2021 1453   AST 23 10/29/2021 1453   ALT 20 10/29/2021 1453   ALKPHOS 65 10/29/2021 1453   BILITOT 0.4  10/29/2021 1453   GFRNONAA >60 10/29/2021 1453   GFRNONAA >60 10/29/2021 1451       PENDING LABS:   RADIOGRAPHIC STUDIES:  No results found.   PATHOLOGY:     ASSESSMENT and THERAPY PLAN:   Carcinoma of breast metastatic to bone [facility]) Ellen [name] is a 26 year old woman with metastatic breast [name] on treatment with Enhertu.  Her labs are stable and she will proceed with treatment today.    We spent a great deal of her appointment today talking about her mental health and difficulty with her [name], and challenges at home.  She is connected with a therapist and will see them next week.  This will be good for her.  She is taking Effexor 150 mg daily.    We discussed that we will need to change her zometa to an Xgeva injection.  She understands this.    We will see Ellen [name] back in 3 weeks for labs, f/u, and her next appointment.     All questions were answered. The patient knows to call the clinic with any problems, questions or concerns. We can certainly see the patient much sooner if necessary.  Total encounter time:45 minutes*in face-to-face visit time, chart review, lab review, care coordination, order entry, and documentation of the encounter time.  Wilber Bihari, NP 11/01/21 8:41 PM Medical Oncology and Hematology [facility] [place], [place] [address] Tel. [phone]    Fax. [phone]  *Total Encounter Time as defined by the Centers for Medicare and Medicaid Services includes, in addition to the face-to-face time of a patient visit (documented in the note above) non-face-to-face time: obtaining and reviewing outside history, ordering and reviewing medications, tests or procedures,  care coordination (communications with other health care professionals or caregivers) and documentation in the medical record.

## 2021-11-05 ENCOUNTER — Ambulatory Visit: Payer: Medicaid Other | Admitting: Hematology and Oncology

## 2021-11-05 ENCOUNTER — Ambulatory Visit: Payer: Self-pay

## 2021-11-05 ENCOUNTER — Other Ambulatory Visit: Payer: Self-pay

## 2021-11-11 ENCOUNTER — Telehealth: Payer: Self-pay | Admitting: Hematology and Oncology

## 2021-11-11 NOTE — Telephone Encounter (Signed)
Scheduled appointments per the WQ. Left voicemail.

## 2021-11-15 ENCOUNTER — Telehealth: Payer: Self-pay | Admitting: Hematology and Oncology

## 2021-11-15 ENCOUNTER — Encounter: Payer: Self-pay | Admitting: Hematology and Oncology

## 2021-11-15 MED FILL — Dexamethasone Sodium Phosphate Inj 100 MG/10ML: INTRAMUSCULAR | Qty: 1 | Status: AC

## 2021-11-15 NOTE — Telephone Encounter (Signed)
Rescheduled appointment per 9/29 secure chat. Left voicemail.

## 2021-11-18 ENCOUNTER — Inpatient Hospital Stay: Payer: Medicaid Other | Attending: Adult Health | Admitting: Hematology and Oncology

## 2021-11-18 ENCOUNTER — Inpatient Hospital Stay: Payer: Medicaid Other

## 2021-11-18 ENCOUNTER — Inpatient Hospital Stay: Payer: Medicaid Other | Admitting: Hematology and Oncology

## 2021-11-18 ENCOUNTER — Telehealth: Payer: Self-pay | Admitting: Hematology and Oncology

## 2021-11-18 VITALS — BP 128/82 | HR 108 | Temp 97.9°F | Resp 18 | Ht 65.0 in | Wt 141.3 lb

## 2021-11-18 DIAGNOSIS — C778 Secondary and unspecified malignant neoplasm of lymph nodes of multiple regions: Secondary | ICD-10-CM | POA: Diagnosis not present

## 2021-11-18 DIAGNOSIS — D61818 Other pancytopenia: Secondary | ICD-10-CM | POA: Insufficient documentation

## 2021-11-18 DIAGNOSIS — F17290 Nicotine dependence, other tobacco product, uncomplicated: Secondary | ICD-10-CM | POA: Insufficient documentation

## 2021-11-18 DIAGNOSIS — Z800 Family history of malignant neoplasm of digestive organs: Secondary | ICD-10-CM | POA: Insufficient documentation

## 2021-11-18 DIAGNOSIS — C50312 Malignant neoplasm of lower-inner quadrant of left female breast: Secondary | ICD-10-CM | POA: Diagnosis not present

## 2021-11-18 DIAGNOSIS — R5383 Other fatigue: Secondary | ICD-10-CM | POA: Diagnosis not present

## 2021-11-18 DIAGNOSIS — C50919 Malignant neoplasm of unspecified site of unspecified female breast: Secondary | ICD-10-CM

## 2021-11-18 DIAGNOSIS — K219 Gastro-esophageal reflux disease without esophagitis: Secondary | ICD-10-CM | POA: Diagnosis not present

## 2021-11-18 DIAGNOSIS — C787 Secondary malignant neoplasm of liver and intrahepatic bile duct: Secondary | ICD-10-CM | POA: Insufficient documentation

## 2021-11-18 DIAGNOSIS — C7951 Secondary malignant neoplasm of bone: Secondary | ICD-10-CM | POA: Diagnosis not present

## 2021-11-18 DIAGNOSIS — J4520 Mild intermittent asthma, uncomplicated: Secondary | ICD-10-CM | POA: Diagnosis not present

## 2021-11-18 DIAGNOSIS — Z923 Personal history of irradiation: Secondary | ICD-10-CM | POA: Insufficient documentation

## 2021-11-18 DIAGNOSIS — C50912 Malignant neoplasm of unspecified site of left female breast: Secondary | ICD-10-CM

## 2021-11-18 DIAGNOSIS — Z5111 Encounter for antineoplastic chemotherapy: Secondary | ICD-10-CM | POA: Diagnosis present

## 2021-11-18 MED FILL — Dexamethasone Sodium Phosphate Inj 100 MG/10ML: INTRAMUSCULAR | Qty: 1 | Status: AC

## 2021-11-18 NOTE — Progress Notes (Signed)
Patient Care Team: Pcp, No as PCP - General [name], MD as Consulting Physician (Hematology and Oncology) [name], [name], NP as Nurse Practitioner (Hematology and Oncology) [name], MD as Consulting Physician (General Surgery)  DIAGNOSIS:  Encounter Diagnoses  Name Primary?   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  Prominent left axillary lymph node cluster of adjacent lymph nodes, irregular nodule right middle lobe 1.1 cm, aggressive lesion right second rib cortical destruction, multiple lucent lesions throughout the thoracic spine including T1 vertebral body and T2, irregular multilobar lesion central left hepatic lobe 4.3 x 4.7 cm.  Several satellite lesions in the left lateral hepatic lobe.  Multiple sclerotic lesions in the bones iliac wings, acetabular, medial iliac bones, sacrum multiple lesions lumbar spine L3-L4 and L5   08/17/2020 Initial Diagnosis   Biopsy revealed IDC with DCIS grade 2, ER 30%, PR 20%, HER2 equivocal by IHC, FISH positive, Ki-67 25%, lymph node positive    Genetic Testing   Negative genetic testing:  No pathogenic variants detected on the [employer] CancerNext-Expanded + RNAinsight panel. The report date is 09/06/2020.   The CancerNext-Expanded + RNAinsight gene panel offered by [employer] and includes sequencing and rearrangement analysis for the following 77 genes: AIP, ALK, APC, ATM, AXIN2, BAP1, BARD1, BLM, BMPR1A, BRCA1, BRCA2, BRIP1, CDC73, CDH1, CDK4, CDKN1B, CDKN2A, CHEK2, CTNNA1, DICER1, FANCC, FH, FLCN, GALNT12, KIF1B, LZTR1, MAX, MEN1, MET, MLH1, MSH2, MSH3, MSH6, MUTYH, NBN, NF1, NF2, NTHL1, PALB2, PHOX2B, PMS2, POT1, PRKAR1A, PTCH1, PTEN, RAD51C, RAD51D, RB1, RECQL,  RET, SDHA, SDHAF2, SDHB, SDHC, SDHD, SMAD4, SMARCA4, SMARCB1, SMARCE1, STK11, SUFU, TMEM127, TP53, TSC1, TSC2, VHL and XRCC2 (sequencing and deletion/duplication); EGFR, EGLN1, HOXB13, KIT, MITF, PDGFRA, POLD1 and POLE (sequencing only); EPCAM and GREM1 (deletion/duplication only). RNA data is routinely analyzed for use in variant interpretation for all genes.   09/17/2021 - 10/07/2021 Chemotherapy   Patient is on Treatment Plan : BREAST METASTATIC fam-trastuzumab deruxtecan-nxki (Enhertu) q21d     09/17/2021 - 10/29/2021 Chemotherapy   Patient is on Treatment Plan : BREAST METASTATIC Fam-Trastuzumab Deruxtecan-nxki (Enhertu) (5.4) q21d     11/19/2021 -  Chemotherapy   Patient is on Treatment Plan : BREAST Carboplatin + Gemcitabine D1,8 q21d     Carcinoma of breast metastatic to bone ([place])  08/17/2020 Initial Diagnosis   Carcinoma of breast metastatic to bone ([place]); Goserelin/Zoladex started and to be given every 4 weeks    Genetic Testing   Negative genetic testing:  No pathogenic variants detected on the [employer] CancerNext-Expanded + RNAinsight panel. The report date is 09/06/2020.   The CancerNext-Expanded + RNAinsight gene panel offered by [employer] and includes sequencing and rearrangement analysis for the following 77 genes: AIP, ALK, APC, ATM, AXIN2, BAP1, BARD1, BLM, BMPR1A, BRCA1, BRCA2, BRIP1, CDC73, CDH1, CDK4, CDKN1B, CDKN2A, CHEK2, CTNNA1, DICER1, FANCC, FH, FLCN, GALNT12, KIF1B, LZTR1, MAX, MEN1, MET, MLH1, MSH2, MSH3, MSH6, MUTYH, NBN, NF1, NF2, NTHL1, PALB2, PHOX2B, PMS2, POT1, PRKAR1A, PTCH1, PTEN, RAD51C, RAD51D, RB1, RECQL, RET, SDHA, SDHAF2, SDHB, SDHC, SDHD, SMAD4, SMARCA4, SMARCB1, SMARCE1, STK11, SUFU, TMEM127, TP53, TSC1, TSC2, VHL and XRCC2 (sequencing and deletion/duplication); EGFR, EGLN1, HOXB13, KIT, MITF, PDGFRA, POLD1 and POLE (sequencing only); EPCAM and GREM1 (deletion/duplication only). RNA data is routinely analyzed  for use in variant interpretation for all genes.    08/18/2020 - 08/30/2020 Radiation Therapy   Palliative radiation to the lumbar and pelvic bone; 30 Gy in 10 fractions   08/31/2020 - 12/18/2020 Adjuvant Chemotherapy   Started with Herceptin/Perjeta on 08/31/2020 and Taxotere on 09/04/2020; she was hospitalized with dehydration and refractory diarrhea for 11 days, she resumed treatment with Taxotere (dose reduced by 47m/m2) and Herceptin only (perjeta discontinued) on 09/26/2020, reintroduced 10/17/2020   09/03/2020 Cancer Staging   Staging form: Breast, AJCC 8th Edition - Clinical stage from 09/03/2020: Stage IV (cT4b, cN1, cM1, G2, ER+, PR+, HER2+) - Signed by CGardenia Phlegm NP on 10/03/2020 Stage prefix: Initial diagnosis Method of lymph node assessment: Clinical Histologic grading system: 3 grade system   01/08/2021 - 08/27/2021 Adjuvant Chemotherapy   Herceptin/Perjeta every 3 weeks, Zometa every 12 weeks added 01/29/2021   07/07/2021 PET scan   1. Interval decrease in size of the dominant left breast mass which now exhibits mild low level FDG uptake with SUV max of 2.7. Small nodule within the left breast exhibits moderate tracer uptake within SUV max of 5.6 and is suspicious for residual/recurrent local tumor. 2. Mild tracer uptake associated with prominent left axillary lymph node with SUV max of 3.35. Residual tracer avid nodal metastasis not excluded. 3. The previously noted liver metastasis are no longer visible on the CT images from today's study. There is no abnormal increased uptake within the left hepatic lobe above background liver activity to suggest metabolically active tumor metastasis. 4. Interval increase in sclerosis associated with previously noted lytic bone metastasis without significant increased uptake above background bone marrow activity compatible with healing multifocal bone metastasis. 5. New pathologic fracture involves the L3 vertebral body where there was extensive lytic changes noted on the  previous CT. The collapsed vertebral body is now sclerotic with mild low level FDG uptake. 6. Similar appearance of soft tissue infiltration within the anterior mediastinal fat which exhibits mild FDG uptake on today's study. 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   07/16/2021 Surgery   S/p BSO, benign pathology    08/26/2021 Mammogram   Mammo diagnostic breast tomosynthesis left: There is an additional  irregular mass in the lower inner position at posterior depth. This was  further evaluated with ultrasound.   Mammo UKoreabreast limited left: There is a 0.7 cm x 0.5 cm x 0.7 cm  irregularly shaped, hypoechoic mass with indistinct margins seen in the  left breast at 8 o'clock, 8 cm from the nipple. This mass is 1.6 cm medial  to the palpable mass as detailed above.    08/26/2021 Pathology Results   New left breast mass 8:00 biopsy: Invasive carcinoma with mixed ductal and lobular and focal micropapillary features.  Grade 3, invasive carcinoma measures 9 mm with an associated lymphoid reaction.  ER negative PR negative HER2 1+.  Left breast mass 2:00 biopsy: Invasive carcinoma with mixed ductal and lobular features Nottingham grade 2 measuring 1.4 cm.  ER 9% (low positive), PR negative, HER2 1+.   09/16/2021 PET scan   1. Interval progression of hypermetabolic metastatic disease. 2. Hypermetabolic bilateral axillary lymphadenopathy, subcarinal and bilateral hilar lymphadenopathy, new/progressive. 3. New hypermetabolic nodularity in the lower left breast is compatible with progressive malignancy. 4. New hypermetabolic mixed lytic and sclerotic left pelvic bone metastases as detailed. 5. Persistent patchy hypermetabolism in the anterior mediastinum associated with mixed soft tissue and fat density, favor thymic hyperplasia.   09/17/2021 - 10/07/2021 Chemotherapy  Patient is on Treatment Plan : BREAST METASTATIC fam-trastuzumab deruxtecan-nxki (Enhertu) q21d      09/17/2021 - 10/29/2021 Chemotherapy   Patient is on Treatment Plan : BREAST METASTATIC Fam-Trastuzumab Deruxtecan-nxki (Enhertu) (5.4) q21d     11/19/2021 -  Chemotherapy   Patient is on Treatment Plan : BREAST Carboplatin + Gemcitabine D1,8 q21d       CHIEF COMPLIANT: Herceptin Perjeta maintenance   INTERVAL HISTORY: Ellen [name] is a 26 y.o with the above mention. She presents to the clinic for a follow-up.  She went to see Dr. Wetzel Bjornstad at [facility] and she recommended that she receive carboplatin and gemcitabine because of evidence of progression on Enhertu.  She is here today accompanied by her mother to discuss her treatment plan.  She is on board with treatment.  ALLERGIES:  is allergic to other.  MEDICATIONS:  Current Outpatient Medications  Medication Sig Dispense Refill   lidocaine-prilocaine (EMLA) cream      acyclovir (ZOVIRAX) 800 MG tablet Take 1 tablet (800 mg total) by mouth 2 (two) times daily as needed (flare up). 60 tablet 5   acyclovir ointment (ZOVIRAX) 5 % Apply 1 application. topically 4 (four) times daily as needed (outbreak).     albuterol (VENTOLIN HFA) 108 (90 Base) MCG/ACT inhaler Inhale 1 puff every 4 hours by inhalation route.     Calcium Carb-Cholecalciferol 500-10 MG-MCG TABS Take by mouth.     Cholecalciferol 50 MCG (2000 UT) CAPS Take by mouth.     ibuprofen (ADVIL) 800 MG tablet Take 1 tablet (800 mg total) by mouth every 8 (eight) hours as needed for moderate pain. For AFTER surgery only 30 tablet 0   LORazepam (ATIVAN) 0.5 MG tablet Take 1 tablet (0.5 mg total) by mouth every 8 (eight) hours as needed for anxiety. 30 tablet 0   magnesium oxide (MAG-OX) 400 (240 Mg) MG tablet Take 1 tablet (400 mg total) by mouth daily. 30 tablet 3   ondansetron (ZOFRAN) 8 MG tablet Take 1 tablet (8 mg total) by mouth every 8 (eight) hours as needed for nausea or vomiting. 20 tablet 0   pantoprazole (PROTONIX) 40 MG tablet Take 1 tablet (40 mg total) by mouth daily. 30  tablet 3   prochlorperazine (COMPAZINE) 10 MG tablet Take 1 tablet (10 mg total) by mouth every 6 (six) hours as needed for nausea or vomiting. 30 tablet 0   venlafaxine XR (EFFEXOR-XR) 150 MG 24 hr capsule Take 1 capsule (150 mg total) by mouth daily with breakfast. 30 capsule 6   VYVANSE 50 MG capsule Take 50 mg by mouth daily.     No current facility-administered medications for this visit.    PHYSICAL EXAMINATION: ECOG PERFORMANCE STATUS: 1 - Symptomatic but completely ambulatory  Vitals:   11/18/21 1212  BP: 128/82  Pulse: (!) 108  Resp: 18  Temp: 97.9 F (36.6 C)  SpO2: 100%   Filed Weights   11/18/21 1212  Weight: 141 lb 4.8 oz (64.1 kg)      LABORATORY DATA:  I have reviewed the data as listed    Latest Ref Rng & Units 10/29/2021    2:53 PM 10/29/2021    2:51 PM 10/07/2021    9:55 AM  CMP  Glucose 70 - 99 mg/dL 114  116  86   BUN 6 - 20 mg/dL $Remove'8  8  8   'MTuWTiE$ Creatinine 0.44 - 1.00 mg/dL 0.90  0.89  0.66   Sodium 135 - 145 mmol/L 139  139  141   Potassium 3.5 - 5.1 mmol/L 4.4  4.5  3.6   Chloride 98 - 111 mmol/L 107  106  110   CO2 22 - 32 mmol/L $RemoveB'28  28  26   'SgDFfajJ$ Calcium 8.9 - 10.3 mg/dL 9.5  9.5  8.9   Total Protein 6.5 - 8.1 g/dL 7.2   5.9   Total Bilirubin 0.3 - 1.2 mg/dL 0.4   0.3   Alkaline Phos 38 - 126 U/L 65   52   AST 15 - 41 U/L 23   14   ALT 0 - 44 U/L 20   15     Lab Results  Component Value Date   WBC 4.7 10/29/2021   HGB 12.6 10/29/2021   HCT 37.8 10/29/2021   MCV 90.4 10/29/2021   PLT 280 10/29/2021   NEUTROABS 1.9 10/29/2021    ASSESSMENT & PLAN:  Carcinoma of breast metastatic to bone ([place]) 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, FISH positive ratio 2.8, Ki-67 25%, lymph node positive   Palliative radiation completed 09/03/2020 Taxotere/Herceptin/Perjeta  08/31/2020-12/18/2020 Herceptin Perjeta Maintenance  beginning 01/08/2021-08/27/2021 Enhertu: 09/17/2021-10/29/2021: Discontinued for progression     PET/CT 07/07/2021: Interval decrease in the left breast mass having an SUV of 2.7, small nodule left breast SUV 5.6, left axillary lymph node SUV 3.35, liver metastases no longer visible.  Sclerosis involving bone metastases.  New compression fracture L3 vertebral spine no sclerotic   Biopsy of the breast at [place] on 08/26/2021: IDC with lobular features ER 90%, PR 0%, HER2 1+ (low) ------------------------------------------------------------------------------------------------------------------------------------ Treatment plan: Carboplatin and gemcitabine Chemo counseling: Discussed with the patient extensively about the pros and cons of doing chemotherapy. She is willing to proceed with the current treatment plan.  She will come back next week to start her first cycle.  Return to clinic on day 1 of chemotherapy.  She will receive carboplatin and gemcitabine on day 1 and day 8 of each cycle    Orders Placed This Encounter  Procedures   Consent Attestation for Oncology Treatment    Order Specific Question:   The patient is informed of risks, benefits, side-effects of the prescribed oncology treatment. Potential short term and long term side effects and response rates discussed. After a long discussion, the patient made informed decision to proceed.    Answer:   Yes   CBC with Differential ([place] Only)    Standing Status:   Future    Standing Expiration Date:   11/20/2022   CMP ([place] only)    Standing Status:   Future    Standing Expiration Date:   11/20/2022   CBC with Differential ([place] Only)    Standing Status:   Future    Standing Expiration Date:   11/27/2022   CMP ([place] only)    Standing Status:   Future    Standing Expiration Date:   11/27/2022   CBC with Differential ([place] Only)    Standing  Status:   Future    Standing Expiration Date:   12/11/2022   CMP ([place] only)    Standing Status:   Future    Standing Expiration Date:   12/11/2022   CBC with Differential ([facility])    Standing Status:   Future    Standing Expiration  Date:   12/18/2022   CMP ([place] only)    Standing Status:   Future    Standing Expiration Date:   12/18/2022   CBC with Differential ([place] Only)    Standing Status:   Future    Standing Expiration Date:   01/01/2023   CMP ([place] only)    Standing Status:   Future    Standing Expiration Date:   01/01/2023   CBC with Differential ([place] Only)    Standing Status:   Future    Standing Expiration Date:   01/08/2023   CMP ([place] only)    Standing Status:   Future    Standing Expiration Date:   01/08/2023   CBC with Differential ([place] Only)    Standing Status:   Future    Standing Expiration Date:   01/22/2023   CMP ([place] only)    Standing Status:   Future    Standing Expiration Date:   01/22/2023   CBC with Differential ([place] Only)    Standing Status:   Future    Standing Expiration Date:   01/29/2023   CMP ([place] only)    Standing Status:   Future    Standing Expiration Date:   01/29/2023   The patient has a good understanding of the overall plan. she agrees with it. she will call with any problems that may develop before the next visit here. Total time spent: 30 mins including face to face time and time spent for planning, charting and co-ordination of care   Harriette Ohara, MD 11/18/21    I Gardiner Coins am scribing for Dr. Lindi Adie  I have reviewed the above documentation for accuracy and completeness, and I agree with the above.

## 2021-11-18 NOTE — Telephone Encounter (Signed)
Rescheduled appointments per 10/2 secure chat. Left voicemail.

## 2021-11-18 NOTE — Assessment & Plan Note (Addendum)
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, ER 30%, PR 20%, HER2 equivocal by IHC, FISH positiveratio 2.8, Ki-67 25%, lymph node positive  Palliative radiation completed 09/03/2020 Taxotere/Herceptin/Perjeta 08/31/2020-12/18/2020 Herceptin Perjeta Maintenance beginning 01/08/2021-08/27/2021 Enhertu: 09/17/2021-10/29/2021: Discontinued for progression   PET/CT 07/07/2021: Interval decrease in the left breast mass having an SUV of 2.7, small nodule left breast SUV 5.6, left axillary lymph node SUV 3.35, liver metastases no longer visible. Sclerosis involving bone metastases. New compression fracture L3 vertebral spine no sclerotic  Biopsy of the breast at [place] on 08/26/2021: IDC with lobular features ER 90%, PR 0%, HER2 1+ (low) ------------------------------------------------------------------------------------------------------------------------------------ Treatment plan: Carboplatin and gemcitabine Chemo counseling: Discussed with the patient extensively about the pros and cons of doing chemotherapy. She is willing to proceed with the current treatment plan.  She will come back next week to start her first cycle.  Return to clinic on day 1 of chemotherapy.  She will receive carboplatin and gemcitabine on day 1 and day 8 of each cycle

## 2021-11-18 NOTE — Progress Notes (Signed)
DISCONTINUE ON PATHWAY REGIMEN - Breast     A cycle is every 21 days:     Fam-trastuzumab deruxtecan-nxki   **Always confirm dose/schedule in your pharmacy ordering system**  REASON: Disease Progression PRIOR TREATMENT: [id number]: Fam-trastuzumab Deruxtecan 5.4 mg/kg q21 Days TREATMENT RESPONSE: Progressive Disease (PD)  START OFF PATHWAY REGIMEN - Breast   [id number]:Carboplatin AUC=2 IV D1,8 + Gemcitabine 1,000 mg/m2 IV D1,8 q21 Days:   A cycle is every 21 days:     Carboplatin      Gemcitabine   **Always confirm dose/schedule in your pharmacy ordering system**  Patient Characteristics: Distant Metastases or Locoregional Recurrent Disease - Unresected or Locally Advanced Unresectable Disease Progressing after Neoadjuvant and Local Therapies, ER Negative/Unknown, Chemotherapy, HER2 Low, Third Line and Beyond, Prior or Contraindicated  Anthracycline and Prior or Contraindicated Eribulin Therapeutic Status: Distant Metastases HER2 Status: Low ER Status: Negative (-) PR Status: Negative (-) Therapy Approach Indicated: Standard Chemotherapy/Endocrine Therapy Line of Therapy: Third Line and Beyond Intent of Therapy: Non-Curative / Palliative Intent, Discussed with Patient

## 2021-11-19 ENCOUNTER — Inpatient Hospital Stay: Payer: Medicaid Other

## 2021-11-19 ENCOUNTER — Ambulatory Visit: Payer: Self-pay | Admitting: Hematology and Oncology

## 2021-11-19 ENCOUNTER — Other Ambulatory Visit: Payer: Self-pay

## 2021-11-19 VITALS — BP 103/85 | HR 93 | Temp 98.4°F | Resp 16

## 2021-11-19 DIAGNOSIS — C50912 Malignant neoplasm of unspecified site of left female breast: Secondary | ICD-10-CM

## 2021-11-19 DIAGNOSIS — C50919 Malignant neoplasm of unspecified site of unspecified female breast: Secondary | ICD-10-CM

## 2021-11-19 DIAGNOSIS — Z5111 Encounter for antineoplastic chemotherapy: Secondary | ICD-10-CM | POA: Diagnosis not present

## 2021-11-19 DIAGNOSIS — Z95828 Presence of other vascular implants and grafts: Secondary | ICD-10-CM

## 2021-11-19 LAB — CBC WITH DIFFERENTIAL (CANCER CENTER ONLY)
Abs Immature Granulocytes: 0.01 10*3/uL (ref 0.00–0.07)
Basophils Absolute: 0 10*3/uL (ref 0.0–0.1)
Basophils Relative: 1 %
Eosinophils Absolute: 0.2 10*3/uL (ref 0.0–0.5)
Eosinophils Relative: 4 %
HCT: 35.5 % — ABNORMAL LOW (ref 36.0–46.0)
Hemoglobin: 12 g/dL (ref 12.0–15.0)
Immature Granulocytes: 0 %
Lymphocytes Relative: 39 %
Lymphs Abs: 1.5 10*3/uL (ref 0.7–4.0)
MCH: 30.2 pg (ref 26.0–34.0)
MCHC: 33.8 g/dL (ref 30.0–36.0)
MCV: 89.2 fL (ref 80.0–100.0)
Monocytes Absolute: 0.4 10*3/uL (ref 0.1–1.0)
Monocytes Relative: 12 %
Neutro Abs: 1.6 10*3/uL — ABNORMAL LOW (ref 1.7–7.7)
Neutrophils Relative %: 44 %
Platelet Count: 353 10*3/uL (ref 150–400)
RBC: 3.98 MIL/uL (ref 3.87–5.11)
RDW: 14 % (ref 11.5–15.5)
WBC Count: 3.7 10*3/uL — ABNORMAL LOW (ref 4.0–10.5)
nRBC: 0 % (ref 0.0–0.2)

## 2021-11-19 LAB — CMP (CANCER CENTER ONLY)
ALT: 17 U/L (ref 0–44)
AST: 21 U/L (ref 15–41)
Albumin: 3.9 g/dL (ref 3.5–5.0)
Alkaline Phosphatase: 63 U/L (ref 38–126)
Anion gap: 3 — ABNORMAL LOW (ref 5–15)
BUN: 9 mg/dL (ref 6–20)
CO2: 26 mmol/L (ref 22–32)
Calcium: 8.6 mg/dL — ABNORMAL LOW (ref 8.9–10.3)
Chloride: 112 mmol/L — ABNORMAL HIGH (ref 98–111)
Creatinine: 0.81 mg/dL (ref 0.44–1.00)
GFR, Estimated: >60 mL/min (ref >60)
Glucose, Bld: 119 mg/dL — ABNORMAL HIGH (ref 70–99)
Potassium: 4.1 mmol/L (ref 3.5–5.1)
Sodium: 141 mmol/L (ref 135–145)
Total Bilirubin: 0.4 mg/dL (ref 0.3–1.2)
Total Protein: 6.3 g/dL — ABNORMAL LOW (ref 6.5–8.1)

## 2021-11-19 MED ORDER — SODIUM CHLORIDE 0.9 % IV SOLN: Freq: Once | INTRAVENOUS | Status: AC

## 2021-11-19 MED ORDER — SODIUM CHLORIDE 0.9% FLUSH
10.0000 mL | Freq: Once | INTRAVENOUS | Status: AC
  Administered 2021-11-19: 10 mL

## 2021-11-19 MED ORDER — SODIUM CHLORIDE 0.9 % IV SOLN
1000.0000 mg/m2 | Freq: Once | INTRAVENOUS | Status: AC
  Administered 2021-11-19: 1710 mg via INTRAVENOUS
  Filled 2021-11-19: qty 44.97

## 2021-11-19 MED ORDER — HEPARIN SOD (PORK) LOCK FLUSH 100 UNIT/ML IV SOLN
500.0000 [IU] | Freq: Once | INTRAVENOUS | Status: AC | PRN
  Administered 2021-11-19: 500 [IU]

## 2021-11-19 MED ORDER — SODIUM CHLORIDE 0.9 % IV SOLN
263.0000 mg | Freq: Once | INTRAVENOUS | Status: AC
  Administered 2021-11-19: 260 mg via INTRAVENOUS
  Filled 2021-11-19: qty 26

## 2021-11-19 MED ORDER — SODIUM CHLORIDE 0.9% FLUSH
10.0000 mL | INTRAVENOUS | Status: DC | PRN
  Administered 2021-11-19: 10 mL

## 2021-11-19 MED ORDER — SODIUM CHLORIDE 0.9 % IV SOLN
10.0000 mg | Freq: Once | INTRAVENOUS | Status: AC
  Administered 2021-11-19: 10 mg via INTRAVENOUS
  Filled 2021-11-19: qty 10

## 2021-11-19 MED ORDER — PALONOSETRON HCL INJECTION 0.25 MG/5ML
0.2500 mg | Freq: Once | INTRAVENOUS | Status: AC
  Administered 2021-11-19: 0.25 mg via INTRAVENOUS
  Filled 2021-11-19: qty 5

## 2021-11-19 NOTE — Patient Instructions (Addendum)
[facility]  Discharge Instructions: Thank you for choosing [facility] to provide your oncology and hematology care.   If you have a lab appointment with the [facility], please go directly to the [facility] and check in at the registration area.   Wear comfortable clothing and clothing appropriate for easy access to any Portacath or PICC line.   We strive to give you quality time with your provider. You may need to reschedule your appointment if you arrive late (15 or more minutes).  Arriving late affects you and other patients whose appointments are after yours.  Also, if you miss three or more appointments without notifying the office, you may be dismissed from the clinic at the provider's discretion.      For prescription refill requests, have your pharmacy contact our office and allow 72 hours for refills to be completed.    Today you received the following chemotherapy and/or immunotherapy agents: Gemzar (gemcitabine) and Carboplatin (Paraplatin)   To help prevent nausea and vomiting after your treatment, we encourage you to take your nausea medication as directed.  BELOW ARE SYMPTOMS THAT SHOULD BE REPORTED IMMEDIATELY: *FEVER GREATER THAN 100.4 F (38 C) OR HIGHER *CHILLS OR SWEATING *NAUSEA AND VOMITING THAT IS NOT CONTROLLED WITH YOUR NAUSEA MEDICATION *UNUSUAL SHORTNESS OF BREATH *UNUSUAL BRUISING OR BLEEDING *URINARY PROBLEMS (pain or burning when urinating, or frequent urination) *BOWEL PROBLEMS (unusual diarrhea, constipation, pain near the anus) TENDERNESS IN MOUTH AND THROAT WITH OR WITHOUT PRESENCE OF ULCERS (sore throat, sores in mouth, or a toothache) UNUSUAL RASH, SWELLING OR PAIN  UNUSUAL VAGINAL DISCHARGE OR ITCHING   Items with * indicate a potential emergency and should be followed up as soon as possible or go to the Emergency Department if any problems should occur.  Please show the CHEMOTHERAPY ALERT CARD or  IMMUNOTHERAPY ALERT CARD at check-in to the Emergency Department and triage nurse.  Should you have questions after your visit or need to cancel or reschedule your appointment, please contact [facility]  Dept: [phone]  and follow the prompts.  Office hours are 8:00 a.m. to 4:30 p.m. Monday - Friday. Please note that voicemails left after 4:00 p.m. may not be returned until the following business day.  We are closed weekends and major holidays. You have access to a nurse at all times for urgent questions. Please call the main number to the clinic Dept: [phone] and follow the prompts.   For any non-urgent questions, you may also contact your provider using MyChart. We now offer e-Visits for anyone 62 and older to request care online for non-urgent symptoms. For details visit [email or web address].   Also download the MyChart app! Go to the app store, search "MyChart", open the app, select [facility], and log in with your MyChart username and password.  Masks are optional in the cancer centers. If you would like for your care team to wear a mask while they are taking care of you, please let them know. You may have one support person who is at least 27 years old accompany you for your appointments. Gemzar (Gemcitabine) What is this medication? GEMCITABINE (jem SYE ta been) treats some types of cancer. It works by slowing down the growth of cancer cells. This medicine may be used for other purposes; ask your health care provider or pharmacist if you have questions. COMMON BRAND NAME(S): Gemzar, Infugem What should I tell my care team before I take this  medication? They need to know if you have any of these conditions: Blood disorders Infection Kidney disease Liver disease Lung or breathing disease, such as asthma or COPD Recent or ongoing radiation therapy An unusual or allergic reaction to gemcitabine, other medications, foods, dyes, or preservatives If  you or your partner are pregnant or trying to get pregnant Breast-feeding How should I use this medication? This medication is injected into a vein. It is given by your care team in a hospital or clinic setting. Talk to your care team about the use of this medication in children. Special care may be needed. Overdosage: If you think you have taken too much of this medicine contact a poison control center or emergency room at once. NOTE: This medicine is only for you. Do not share this medicine with others. What if I miss a dose? Keep appointments for follow-up doses. It is important not to miss your dose. Call your care team if you are unable to keep an appointment. What may interact with this medication? Interactions have not been studied. This list may not describe all possible interactions. Give your health care provider a list of all the medicines, herbs, non-prescription drugs, or dietary supplements you use. Also tell them if you smoke, drink alcohol, or use illegal drugs. Some items may interact with your medicine. What should I watch for while using this medication? Your condition will be monitored carefully while you are receiving this medication. This medication may make you feel generally unwell. This is not uncommon, as chemotherapy can affect healthy cells as well as cancer cells. Report any side effects. Continue your course of treatment even though you feel ill unless your care team tells you to stop. In some cases, you may be given additional medications to help with side effects. Follow all directions for their use. This medication may increase your risk of getting an infection. Call your care team for advice if you get a fever, chills, sore throat, or other symptoms of a cold or flu. Do not treat yourself. Try to avoid being around people who are sick. This medication may increase your risk to bruise or bleed. Call your care team if you notice any unusual bleeding. Be careful  brushing or flossing your teeth or using a toothpick because you may get an infection or bleed more easily. If you have any dental work done, tell your dentist you are receiving this medication. Avoid taking medications that contain aspirin, acetaminophen, ibuprofen, naproxen, or ketoprofen unless instructed by your care team. These medications may hide a fever. Talk to your care team if you or your partner wish to become pregnant or think you might be pregnant. This medication can cause serious birth defects if taken during pregnancy and for 6 months after the last dose. A negative pregnancy test is required before starting this medication. A reliable form of contraception is recommended while taking this medication and for 6 months after the last dose. Talk to your care team about effective forms of contraception. Do not father a child while taking this medication and for 3 months after the last dose. Use a condom while having sex during this time period. Do not breastfeed while taking this medication and for at least 1 week after the last dose. This medication may cause infertility. Talk to your care team if you are concerned about your fertility. What side effects may I notice from receiving this medication? Side effects that you should report to your care team as soon  as possible: Allergic reactions--skin rash, itching, hives, swelling of the face, lips, tongue, or throat Capillary leak syndrome--stomach or muscle pain, unusual weakness or fatigue, feeling faint or lightheaded, decrease in the amount of urine, swelling of the ankles, hands, or feet, trouble breathing Infection--fever, chills, cough, sore throat, wounds that don't heal, pain or trouble when passing urine, general feeling of discomfort or being unwell Liver injury--right upper belly pain, loss of appetite, nausea, light-colored stool, dark yellow or brown urine, yellowing skin or eyes, unusual weakness or fatigue Low red blood cell  level--unusual weakness or fatigue, dizziness, headache, trouble breathing Lung injury--shortness of breath or trouble breathing, cough, spitting up blood, chest pain, fever Stomach pain, bloody diarrhea, pale skin, unusual weakness or fatigue, decrease in the amount of urine, which may be signs of hemolytic uremic syndrome Sudden and severe headache, confusion, change in vision, seizures, which may be signs of posterior reversible encephalopathy syndrome (PRES) Unusual bruising or bleeding Side effects that usually do not require medical attention (report to your care team if they continue or are bothersome): Diarrhea Drowsiness Hair loss Nausea Pain, redness, or swelling with sores inside the mouth or throat Vomiting This list may not describe all possible side effects. Call your doctor for medical advice about side effects. You may report side effects to FDA at 1-800-FDA-1088. Where should I keep my medication? This medication is given in a hospital or clinic. It will not be stored at home. NOTE: This sheet is a summary. It may not cover all possible information. If you have questions about this medicine, talk to your doctor, pharmacist, or health care provider.  2023 Elsevier/Gold Standard (2021-06-05 00:00:00) Carboplatin  What is this medication? CARBOPLATIN (KAR boe pla tin) treats some types of cancer. It works by slowing down the growth of cancer cells. This medicine may be used for other purposes; ask your health care provider or pharmacist if you have questions. COMMON BRAND NAME(S): Paraplatin What should I tell my care team before I take this medication? They need to know if you have any of these conditions: Blood disorders Hearing problems Kidney disease Recent or ongoing radiation therapy An unusual or allergic reaction to carboplatin, cisplatin, other medications, foods, dyes, or preservatives Pregnant or trying to get pregnant Breast-feeding How should I use this  medication? This medication is injected into a vein. It is given by your care team in a hospital or clinic setting. Talk to your care team about the use of this medication in children. Special care may be needed. Overdosage: If you think you have taken too much of this medicine contact a poison control center or emergency room at once. NOTE: This medicine is only for you. Do not share this medicine with others. What if I miss a dose? Keep appointments for follow-up doses. It is important not to miss your dose. Call your care team if you are unable to keep an appointment. What may interact with this medication? Medications for seizures Some antibiotics, such as amikacin, gentamicin, neomycin, streptomycin, tobramycin Vaccines This list may not describe all possible interactions. Give your health care provider a list of all the medicines, herbs, non-prescription drugs, or dietary supplements you use. Also tell them if you smoke, drink alcohol, or use illegal drugs. Some items may interact with your medicine. What should I watch for while using this medication? Your condition will be monitored carefully while you are receiving this medication. You may need blood work while taking this medication. This medication may make  you feel generally unwell. This is not uncommon, as chemotherapy can affect healthy cells as well as cancer cells. Report any side effects. Continue your course of treatment even though you feel ill unless your care team tells you to stop. In some cases, you may be given additional medications to help with side effects. Follow all directions for their use. This medication may increase your risk of getting an infection. Call your care team for advice if you get a fever, chills, sore throat, or other symptoms of a cold or flu. Do not treat yourself. Try to avoid being around people who are sick. Avoid taking medications that contain aspirin, acetaminophen, ibuprofen, naproxen, or  ketoprofen unless instructed by your care team. These medications may hide a fever. Be careful brushing or flossing your teeth or using a toothpick because you may get an infection or bleed more easily. If you have any dental work done, tell your dentist you are receiving this medication. Talk to your care team if you wish to become pregnant or think you might be pregnant. This medication can cause serious birth defects. Talk to your care team about effective forms of contraception. Do not breast-feed while taking this medication. What side effects may I notice from receiving this medication? Side effects that you should report to your care team as soon as possible: Allergic reactions--skin rash, itching, hives, swelling of the face, lips, tongue, or throat Infection--fever, chills, cough, sore throat, wounds that don't heal, pain or trouble when passing urine, general feeling of discomfort or being unwell Low red blood cell level--unusual weakness or fatigue, dizziness, headache, trouble breathing Pain, tingling, or numbness in the hands or feet, muscle weakness, change in vision, confusion or trouble speaking, loss of balance or coordination, trouble walking, seizures Unusual bruising or bleeding Side effects that usually do not require medical attention (report to your care team if they continue or are bothersome): Hair loss Nausea Unusual weakness or fatigue Vomiting This list may not describe all possible side effects. Call your doctor for medical advice about side effects. You may report side effects to FDA at 1-800-FDA-1088. Where should I keep my medication? This medication is given in a hospital or clinic. It will not be stored at home. NOTE: This sheet is a summary. It may not cover all possible information. If you have questions about this medicine, talk to your doctor, pharmacist, or health care provider.  2023 Elsevier/Gold Standard (2021-05-28 00:00:00)

## 2021-11-20 ENCOUNTER — Encounter: Payer: Self-pay | Admitting: General Practice

## 2021-11-20 ENCOUNTER — Other Ambulatory Visit: Payer: Medicaid Other

## 2021-11-20 ENCOUNTER — Ambulatory Visit: Payer: Medicaid Other

## 2021-11-20 NOTE — Progress Notes (Signed)
[facility] Note  Followed up with Ellen [name] by phone. She is working her self-care plan by starting a new counseling relationship, taking a psychoeducational class, and [facility]. She notes that this combination of initiatives is helping her "feel so much better."  Provided reflective listening and affirmation of strengths. We plan to follow up at her treatment on 10/23 for another pastoral check-in.   [place], [place], [facility] Pager [phone] Voicemail [phone]

## 2021-11-26 ENCOUNTER — Ambulatory Visit: Payer: Self-pay | Admitting: Hematology and Oncology

## 2021-11-26 ENCOUNTER — Ambulatory Visit: Payer: Medicaid Other

## 2021-11-26 ENCOUNTER — Other Ambulatory Visit: Payer: Self-pay

## 2021-11-26 MED FILL — Dexamethasone Sodium Phosphate Inj 100 MG/10ML: INTRAMUSCULAR | Qty: 1 | Status: AC

## 2021-11-27 ENCOUNTER — Encounter: Payer: Self-pay | Admitting: Adult Health

## 2021-11-27 ENCOUNTER — Inpatient Hospital Stay: Payer: Medicaid Other

## 2021-11-27 ENCOUNTER — Inpatient Hospital Stay (HOSPITAL_BASED_OUTPATIENT_CLINIC_OR_DEPARTMENT_OTHER): Payer: Medicaid Other | Admitting: Adult Health

## 2021-11-27 VITALS — BP 128/72 | HR 88 | Temp 98.2°F | Resp 18

## 2021-11-27 DIAGNOSIS — C50912 Malignant neoplasm of unspecified site of left female breast: Secondary | ICD-10-CM

## 2021-11-27 DIAGNOSIS — C50919 Malignant neoplasm of unspecified site of unspecified female breast: Secondary | ICD-10-CM

## 2021-11-27 DIAGNOSIS — Z5111 Encounter for antineoplastic chemotherapy: Secondary | ICD-10-CM | POA: Diagnosis not present

## 2021-11-27 DIAGNOSIS — Z95828 Presence of other vascular implants and grafts: Secondary | ICD-10-CM

## 2021-11-27 DIAGNOSIS — C7951 Secondary malignant neoplasm of bone: Secondary | ICD-10-CM

## 2021-11-27 LAB — CBC WITH DIFFERENTIAL (CANCER CENTER ONLY)
Abs Immature Granulocytes: 0.02 10*3/uL (ref 0.00–0.07)
Basophils Absolute: 0 10*3/uL (ref 0.0–0.1)
Basophils Relative: 0 %
Eosinophils Absolute: 0.1 10*3/uL (ref 0.0–0.5)
Eosinophils Relative: 2 %
HCT: 33.7 % — ABNORMAL LOW (ref 36.0–46.0)
Hemoglobin: 11.7 g/dL — ABNORMAL LOW (ref 12.0–15.0)
Immature Granulocytes: 1 %
Lymphocytes Relative: 37 %
Lymphs Abs: 1.2 10*3/uL (ref 0.7–4.0)
MCH: 30.9 pg (ref 26.0–34.0)
MCHC: 34.7 g/dL (ref 30.0–36.0)
MCV: 88.9 fL (ref 80.0–100.0)
Monocytes Absolute: 0.1 10*3/uL (ref 0.1–1.0)
Monocytes Relative: 3 %
Neutro Abs: 1.8 10*3/uL (ref 1.7–7.7)
Neutrophils Relative %: 57 %
Platelet Count: 166 10*3/uL (ref 150–400)
RBC: 3.79 MIL/uL — ABNORMAL LOW (ref 3.87–5.11)
RDW: 13.6 % (ref 11.5–15.5)
Smear Review: NORMAL
WBC Count: 3.2 10*3/uL — ABNORMAL LOW (ref 4.0–10.5)
nRBC: 0 % (ref 0.0–0.2)

## 2021-11-27 LAB — CMP (CANCER CENTER ONLY)
ALT: 21 U/L (ref 0–44)
AST: 25 U/L (ref 15–41)
Albumin: 4 g/dL (ref 3.5–5.0)
Alkaline Phosphatase: 54 U/L (ref 38–126)
Anion gap: 5 (ref 5–15)
BUN: 11 mg/dL (ref 6–20)
CO2: 27 mmol/L (ref 22–32)
Calcium: 9 mg/dL (ref 8.9–10.3)
Chloride: 108 mmol/L (ref 98–111)
Creatinine: 0.67 mg/dL (ref 0.44–1.00)
GFR, Estimated: >60 mL/min (ref >60)
Glucose, Bld: 123 mg/dL — ABNORMAL HIGH (ref 70–99)
Potassium: 4.2 mmol/L (ref 3.5–5.1)
Sodium: 140 mmol/L (ref 135–145)
Total Bilirubin: 0.2 mg/dL — ABNORMAL LOW (ref 0.3–1.2)
Total Protein: 6.8 g/dL (ref 6.5–8.1)

## 2021-11-27 MED ORDER — PALONOSETRON HCL INJECTION 0.25 MG/5ML
0.2500 mg | Freq: Once | INTRAVENOUS | Status: AC
  Administered 2021-11-27: 0.25 mg via INTRAVENOUS
  Filled 2021-11-27: qty 5

## 2021-11-27 MED ORDER — SODIUM CHLORIDE 0.9% FLUSH
10.0000 mL | Freq: Once | INTRAVENOUS | Status: AC
  Administered 2021-11-27: 10 mL

## 2021-11-27 MED ORDER — SODIUM CHLORIDE 0.9 % IV SOLN
10.0000 mg | Freq: Once | INTRAVENOUS | Status: AC
  Administered 2021-11-27: 10 mg via INTRAVENOUS
  Filled 2021-11-27: qty 10

## 2021-11-27 MED ORDER — SODIUM CHLORIDE 0.9 % IV SOLN
1000.0000 mg/m2 | Freq: Once | INTRAVENOUS | Status: AC
  Administered 2021-11-27: 1710 mg via INTRAVENOUS
  Filled 2021-11-27: qty 44.97

## 2021-11-27 MED ORDER — SODIUM CHLORIDE 0.9 % IV SOLN: Freq: Once | INTRAVENOUS | Status: AC

## 2021-11-27 MED ORDER — SODIUM CHLORIDE 0.9 % IV SOLN
265.6000 mg | Freq: Once | INTRAVENOUS | Status: AC
  Administered 2021-11-27: 270 mg via INTRAVENOUS
  Filled 2021-11-27: qty 27

## 2021-11-27 NOTE — Patient Instructions (Signed)
[facility]  Discharge Instructions: Thank you for choosing [facility] to provide your oncology and hematology care.   If you have a lab appointment with the [facility], please go directly to the [facility] and check in at the registration area.   Wear comfortable clothing and clothing appropriate for easy access to any Portacath or PICC line.   We strive to give you quality time with your provider. You may need to reschedule your appointment if you arrive late (15 or more minutes).  Arriving late affects you and other patients whose appointments are after yours.  Also, if you miss three or more appointments without notifying the office, you may be dismissed from the clinic at the provider's discretion.      For prescription refill requests, have your pharmacy contact our office and allow 72 hours for refills to be completed.    Today you received the following chemotherapy and/or immunotherapy agents: Gemzar (gemcitabine) and Carboplatin (Paraplatin)   To help prevent nausea and vomiting after your treatment, we encourage you to take your nausea medication as directed.  BELOW ARE SYMPTOMS THAT SHOULD BE REPORTED IMMEDIATELY: *FEVER GREATER THAN 100.4 F (38 C) OR HIGHER *CHILLS OR SWEATING *NAUSEA AND VOMITING THAT IS NOT CONTROLLED WITH YOUR NAUSEA MEDICATION *UNUSUAL SHORTNESS OF BREATH *UNUSUAL BRUISING OR BLEEDING *URINARY PROBLEMS (pain or burning when urinating, or frequent urination) *BOWEL PROBLEMS (unusual diarrhea, constipation, pain near the anus) TENDERNESS IN MOUTH AND THROAT WITH OR WITHOUT PRESENCE OF ULCERS (sore throat, sores in mouth, or a toothache) UNUSUAL RASH, SWELLING OR PAIN  UNUSUAL VAGINAL DISCHARGE OR ITCHING   Items with * indicate a potential emergency and should be followed up as soon as possible or go to the Emergency Department if any problems should occur.  Please show the CHEMOTHERAPY ALERT CARD or  IMMUNOTHERAPY ALERT CARD at check-in to the Emergency Department and triage nurse.  Should you have questions after your visit or need to cancel or reschedule your appointment, please contact [facility]  Dept: [phone]  and follow the prompts.  Office hours are 8:00 a.m. to 4:30 p.m. Monday - Friday. Please note that voicemails left after 4:00 p.m. may not be returned until the following business day.  We are closed weekends and major holidays. You have access to a nurse at all times for urgent questions. Please call the main number to the clinic Dept: [phone] and follow the prompts.   For any non-urgent questions, you may also contact your provider using MyChart. We now offer e-Visits for anyone 15 and older to request care online for non-urgent symptoms. For details visit [email or web address].   Also download the MyChart app! Go to the app store, search "MyChart", open the app, select [facility], and log in with your MyChart username and password.  Masks are optional in the cancer centers. If you would like for your care team to wear a mask while they are taking care of you, please let them know. You may have one support person who is at least 27 years old accompany you for your appointments. Gemzar (Gemcitabine) What is this medication? GEMCITABINE (jem SYE ta been) treats some types of cancer. It works by slowing down the growth of cancer cells. This medicine may be used for other purposes; ask your health care provider or pharmacist if you have questions. COMMON BRAND NAME(S): Gemzar, Infugem What should I tell my care team before I take this  medication? They need to know if you have any of these conditions: Blood disorders Infection Kidney disease Liver disease Lung or breathing disease, such as asthma or COPD Recent or ongoing radiation therapy An unusual or allergic reaction to gemcitabine, other medications, foods, dyes, or preservatives If  you or your partner are pregnant or trying to get pregnant Breast-feeding How should I use this medication? This medication is injected into a vein. It is given by your care team in a hospital or clinic setting. Talk to your care team about the use of this medication in children. Special care may be needed. Overdosage: If you think you have taken too much of this medicine contact a poison control center or emergency room at once. NOTE: This medicine is only for you. Do not share this medicine with others. What if I miss a dose? Keep appointments for follow-up doses. It is important not to miss your dose. Call your care team if you are unable to keep an appointment. What may interact with this medication? Interactions have not been studied. This list may not describe all possible interactions. Give your health care provider a list of all the medicines, herbs, non-prescription drugs, or dietary supplements you use. Also tell them if you smoke, drink alcohol, or use illegal drugs. Some items may interact with your medicine. What should I watch for while using this medication? Your condition will be monitored carefully while you are receiving this medication. This medication may make you feel generally unwell. This is not uncommon, as chemotherapy can affect healthy cells as well as cancer cells. Report any side effects. Continue your course of treatment even though you feel ill unless your care team tells you to stop. In some cases, you may be given additional medications to help with side effects. Follow all directions for their use. This medication may increase your risk of getting an infection. Call your care team for advice if you get a fever, chills, sore throat, or other symptoms of a cold or flu. Do not treat yourself. Try to avoid being around people who are sick. This medication may increase your risk to bruise or bleed. Call your care team if you notice any unusual bleeding. Be careful  brushing or flossing your teeth or using a toothpick because you may get an infection or bleed more easily. If you have any dental work done, tell your dentist you are receiving this medication. Avoid taking medications that contain aspirin, acetaminophen, ibuprofen, naproxen, or ketoprofen unless instructed by your care team. These medications may hide a fever. Talk to your care team if you or your partner wish to become pregnant or think you might be pregnant. This medication can cause serious birth defects if taken during pregnancy and for 6 months after the last dose. A negative pregnancy test is required before starting this medication. A reliable form of contraception is recommended while taking this medication and for 6 months after the last dose. Talk to your care team about effective forms of contraception. Do not father a child while taking this medication and for 3 months after the last dose. Use a condom while having sex during this time period. Do not breastfeed while taking this medication and for at least 1 week after the last dose. This medication may cause infertility. Talk to your care team if you are concerned about your fertility. What side effects may I notice from receiving this medication? Side effects that you should report to your care team as soon  as possible: Allergic reactions--skin rash, itching, hives, swelling of the face, lips, tongue, or throat Capillary leak syndrome--stomach or muscle pain, unusual weakness or fatigue, feeling faint or lightheaded, decrease in the amount of urine, swelling of the ankles, hands, or feet, trouble breathing Infection--fever, chills, cough, sore throat, wounds that don't heal, pain or trouble when passing urine, general feeling of discomfort or being unwell Liver injury--right upper belly pain, loss of appetite, nausea, light-colored stool, dark yellow or brown urine, yellowing skin or eyes, unusual weakness or fatigue Low red blood cell  level--unusual weakness or fatigue, dizziness, headache, trouble breathing Lung injury--shortness of breath or trouble breathing, cough, spitting up blood, chest pain, fever Stomach pain, bloody diarrhea, pale skin, unusual weakness or fatigue, decrease in the amount of urine, which may be signs of hemolytic uremic syndrome Sudden and severe headache, confusion, change in vision, seizures, which may be signs of posterior reversible encephalopathy syndrome (PRES) Unusual bruising or bleeding Side effects that usually do not require medical attention (report to your care team if they continue or are bothersome): Diarrhea Drowsiness Hair loss Nausea Pain, redness, or swelling with sores inside the mouth or throat Vomiting This list may not describe all possible side effects. Call your doctor for medical advice about side effects. You may report side effects to FDA at 1-800-FDA-1088. Where should I keep my medication? This medication is given in a hospital or clinic. It will not be stored at home. NOTE: This sheet is a summary. It may not cover all possible information. If you have questions about this medicine, talk to your doctor, pharmacist, or health care provider.  2023 Elsevier/Gold Standard (2021-06-05 00:00:00) Carboplatin  What is this medication? CARBOPLATIN (KAR boe pla tin) treats some types of cancer. It works by slowing down the growth of cancer cells. This medicine may be used for other purposes; ask your health care provider or pharmacist if you have questions. COMMON BRAND NAME(S): Paraplatin What should I tell my care team before I take this medication? They need to know if you have any of these conditions: Blood disorders Hearing problems Kidney disease Recent or ongoing radiation therapy An unusual or allergic reaction to carboplatin, cisplatin, other medications, foods, dyes, or preservatives Pregnant or trying to get pregnant Breast-feeding How should I use this  medication? This medication is injected into a vein. It is given by your care team in a hospital or clinic setting. Talk to your care team about the use of this medication in children. Special care may be needed. Overdosage: If you think you have taken too much of this medicine contact a poison control center or emergency room at once. NOTE: This medicine is only for you. Do not share this medicine with others. What if I miss a dose? Keep appointments for follow-up doses. It is important not to miss your dose. Call your care team if you are unable to keep an appointment. What may interact with this medication? Medications for seizures Some antibiotics, such as amikacin, gentamicin, neomycin, streptomycin, tobramycin Vaccines This list may not describe all possible interactions. Give your health care provider a list of all the medicines, herbs, non-prescription drugs, or dietary supplements you use. Also tell them if you smoke, drink alcohol, or use illegal drugs. Some items may interact with your medicine. What should I watch for while using this medication? Your condition will be monitored carefully while you are receiving this medication. You may need blood work while taking this medication. This medication may make  you feel generally unwell. This is not uncommon, as chemotherapy can affect healthy cells as well as cancer cells. Report any side effects. Continue your course of treatment even though you feel ill unless your care team tells you to stop. In some cases, you may be given additional medications to help with side effects. Follow all directions for their use. This medication may increase your risk of getting an infection. Call your care team for advice if you get a fever, chills, sore throat, or other symptoms of a cold or flu. Do not treat yourself. Try to avoid being around people who are sick. Avoid taking medications that contain aspirin, acetaminophen, ibuprofen, naproxen, or  ketoprofen unless instructed by your care team. These medications may hide a fever. Be careful brushing or flossing your teeth or using a toothpick because you may get an infection or bleed more easily. If you have any dental work done, tell your dentist you are receiving this medication. Talk to your care team if you wish to become pregnant or think you might be pregnant. This medication can cause serious birth defects. Talk to your care team about effective forms of contraception. Do not breast-feed while taking this medication. What side effects may I notice from receiving this medication? Side effects that you should report to your care team as soon as possible: Allergic reactions--skin rash, itching, hives, swelling of the face, lips, tongue, or throat Infection--fever, chills, cough, sore throat, wounds that don't heal, pain or trouble when passing urine, general feeling of discomfort or being unwell Low red blood cell level--unusual weakness or fatigue, dizziness, headache, trouble breathing Pain, tingling, or numbness in the hands or feet, muscle weakness, change in vision, confusion or trouble speaking, loss of balance or coordination, trouble walking, seizures Unusual bruising or bleeding Side effects that usually do not require medical attention (report to your care team if they continue or are bothersome): Hair loss Nausea Unusual weakness or fatigue Vomiting This list may not describe all possible side effects. Call your doctor for medical advice about side effects. You may report side effects to FDA at 1-800-FDA-1088. Where should I keep my medication? This medication is given in a hospital or clinic. It will not be stored at home. NOTE: This sheet is a summary. It may not cover all possible information. If you have questions about this medicine, talk to your doctor, pharmacist, or health care provider.  2023 Elsevier/Gold Standard (2021-05-28 00:00:00)

## 2021-11-27 NOTE — Assessment & Plan Note (Signed)
Ellen [name] is here for follow-up and evaluation of her metastatic breast cancer.  She is status post cycle 1 day 1 Gemzar [place] and is here today to proceed with cycle 1 day 8 of therapy.  She tolerated it moderately well.  Her current full CBC results are pending, however she has a slightly decreased white blood cell count.  Her c-Met is pending at the time of visit completion.  She tolerated treatment very well and will continue to receive this as scheduled so long as her labs are within parameters.  She also receives [place] given every 12 weeks and this will not be due until December.  She is working on processing the stress related to her diagnosis and developing boundaries in her relationships through seeing a therapist and also a relationships class.  Her mood is much better since I saw her last.  Ellen [name] will return in 2 weeks for labs, follow-up with Dr. Lindi Adie, cycle 2-day 1 of therapy.

## 2021-11-27 NOTE — Progress Notes (Signed)
Ellen [name] Follow up:    Pcp, No No address on file   DIAGNOSIS:  [name] Staging  Carcinoma of breast metastatic to bone [facility]) Staging form: Breast, AJCC 8th Edition - Clinical stage from 09/03/2020: Stage IV (cT4b, cN1, cM1, G2, ER+, PR+, HER2+) - Signed by Gardenia Phlegm, NP on 10/03/2020 Stage prefix: Initial diagnosis Method of lymph node assessment: Clinical Histologic grading system: 3 grade system   SUMMARY OF ONCOLOGIC HISTORY: Oncology History  Primary malignant neoplasm of breast with metastasis ([place])  08/15/2020 Imaging   Large mass in the medial left breast measuring 3.5 cm.  Prominent left axillary lymph node cluster of adjacent lymph nodes, irregular nodule right middle lobe 1.1 cm, aggressive lesion right second rib cortical destruction, multiple lucent lesions throughout the thoracic spine including T1 vertebral body and T2, irregular multilobar lesion central left hepatic lobe 4.3 x 4.7 cm.  Several satellite lesions in the left lateral hepatic lobe.  Multiple sclerotic lesions in the bones iliac wings, acetabular, medial iliac bones, sacrum multiple lesions lumbar spine L3-L4 and L5   08/17/2020 Initial Diagnosis   Biopsy revealed IDC with DCIS grade 2, ER 30%, PR 20%, HER2 equivocal by IHC, FISH positive, Ki-67 25%, lymph node positive    Genetic Testing   Negative genetic testing:  No pathogenic variants detected on the [employer] CancerNext-Expanded + RNAinsight panel. The report date is 09/06/2020.   The CancerNext-Expanded + RNAinsight gene panel offered by [employer] and includes sequencing and rearrangement analysis for the following 77 genes: AIP, ALK, APC, ATM, AXIN2, BAP1, BARD1, BLM, BMPR1A, BRCA1, BRCA2, BRIP1, CDC73, CDH1, CDK4, CDKN1B, CDKN2A, CHEK2, CTNNA1, DICER1, FANCC, FH, FLCN, GALNT12, KIF1B, LZTR1, MAX, MEN1, MET, MLH1, MSH2, MSH3, MSH6, MUTYH, NBN, NF1, NF2, NTHL1, PALB2, PHOX2B, PMS2, POT1, PRKAR1A, PTCH1, PTEN, RAD51C,  RAD51D, RB1, RECQL, RET, SDHA, SDHAF2, SDHB, SDHC, SDHD, SMAD4, SMARCA4, SMARCB1, SMARCE1, STK11, SUFU, TMEM127, TP53, TSC1, TSC2, VHL and XRCC2 (sequencing and deletion/duplication); EGFR, EGLN1, HOXB13, KIT, MITF, PDGFRA, POLD1 and POLE (sequencing only); EPCAM and GREM1 (deletion/duplication only). RNA data is routinely analyzed for use in variant interpretation for all genes.   09/17/2021 - 10/07/2021 Chemotherapy   Patient is on Treatment Plan : BREAST METASTATIC fam-trastuzumab deruxtecan-nxki (Enhertu) q21d     09/17/2021 - 10/29/2021 Chemotherapy   Patient is on Treatment Plan : BREAST METASTATIC Fam-Trastuzumab Deruxtecan-nxki (Enhertu) (5.4) q21d     11/19/2021 -  Chemotherapy   Patient is on Treatment Plan : BREAST Carboplatin + Gemcitabine D1,8 q21d     Carcinoma of breast metastatic to bone ([place])  08/17/2020 Initial Diagnosis   Carcinoma of breast metastatic to bone ([place]); Goserelin/Zoladex started and to be given every 4 weeks    Genetic Testing   Negative genetic testing:  No pathogenic variants detected on the [employer] CancerNext-Expanded + RNAinsight panel. The report date is 09/06/2020.   The CancerNext-Expanded + RNAinsight gene panel offered by [employer] and includes sequencing and rearrangement analysis for the following 77 genes: AIP, ALK, APC, ATM, AXIN2, BAP1, BARD1, BLM, BMPR1A, BRCA1, BRCA2, BRIP1, CDC73, CDH1, CDK4, CDKN1B, CDKN2A, CHEK2, CTNNA1, DICER1, FANCC, FH, FLCN, GALNT12, KIF1B, LZTR1, MAX, MEN1, MET, MLH1, MSH2, MSH3, MSH6, MUTYH, NBN, NF1, NF2, NTHL1, PALB2, PHOX2B, PMS2, POT1, PRKAR1A, PTCH1, PTEN, RAD51C, RAD51D, RB1, RECQL, RET, SDHA, SDHAF2, SDHB, SDHC, SDHD, SMAD4, SMARCA4, SMARCB1, SMARCE1, STK11, SUFU, TMEM127, TP53, TSC1, TSC2, VHL and XRCC2 (sequencing and deletion/duplication); EGFR, EGLN1, HOXB13, KIT, MITF, PDGFRA, POLD1 and POLE (sequencing only); EPCAM and GREM1 (  deletion/duplication only). RNA data is routinely analyzed for use in variant interpretation  for all genes.   08/18/2020 - 08/30/2020 Radiation Therapy   Palliative radiation to the lumbar and pelvic bone; 30 Gy in 10 fractions   08/31/2020 - 12/18/2020 Adjuvant Chemotherapy   Started with Herceptin/Perjeta on 08/31/2020 and Taxotere on 09/04/2020; she was hospitalized with dehydration and refractory diarrhea for 11 days, she resumed treatment with Taxotere (dose reduced by 46m/m2) and Herceptin only (perjeta discontinued) on 09/26/2020, reintroduced 10/17/2020   09/03/2020 [name] Staging   Staging form: Breast, AJCC 8th Edition - Clinical stage from 09/03/2020: Stage IV (cT4b, cN1, cM1, G2, ER+, PR+, HER2+) - Signed by CGardenia Phlegm NP on 10/03/2020 Stage prefix: Initial diagnosis Method of lymph node assessment: Clinical Histologic grading system: 3 grade system   01/08/2021 - 08/27/2021 Adjuvant Chemotherapy   Herceptin/Perjeta every 3 weeks, Zometa every 12 weeks added 01/29/2021   07/07/2021 PET scan   1. Interval decrease in size of the dominant left breast mass which now exhibits mild low level FDG uptake with SUV max of 2.7. Small nodule within the left breast exhibits moderate tracer uptake within SUV max of 5.6 and is suspicious for residual/recurrent local tumor. 2. Mild tracer uptake associated with prominent left axillary lymph node with SUV max of 3.35. Residual tracer avid nodal metastasis not excluded. 3. The previously noted liver metastasis are no longer visible on the CT images from today's study. There is no abnormal increased uptake within the left hepatic lobe above background liver activity to suggest metabolically active tumor metastasis. 4. Interval increase in sclerosis associated with previously noted lytic bone metastasis without significant increased uptake above background bone marrow activity compatible with healing multifocal bone metastasis. 5. New pathologic fracture involves the L3 vertebral body where there was extensive lytic changes  noted on the previous CT. The collapsed vertebral body is now sclerotic with mild low level FDG uptake. 6. Similar appearance of soft tissue infiltration within the anterior mediastinal fat which exhibits mild FDG uptake on today's study. 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   07/16/2021 Surgery   S/p BSO, benign pathology    08/26/2021 Mammogram   Mammo diagnostic breast tomosynthesis left: There is an additional  irregular mass in the lower inner position at posterior depth. This was  further evaluated with ultrasound.   Mammo UKoreabreast limited left: There is a 0.7 cm x 0.5 cm x 0.7 cm  irregularly shaped, hypoechoic mass with indistinct margins seen in the  left breast at 8 o'clock, 8 cm from the nipple. This mass is 1.6 cm medial  to the palpable mass as detailed above.    08/26/2021 Pathology Results   New left breast mass 8:00 biopsy: Invasive carcinoma with mixed ductal and lobular and focal micropapillary features.  Grade 3, invasive carcinoma measures 9 mm with an associated lymphoid reaction.  ER negative PR negative HER2 1+.  Left breast mass 2:00 biopsy: Invasive carcinoma with mixed ductal and lobular features Nottingham grade 2 measuring 1.4 cm.  ER 9% (low positive), PR negative, HER2 1+.   09/16/2021 PET scan   1. Interval progression of hypermetabolic metastatic disease. 2. Hypermetabolic bilateral axillary lymphadenopathy, subcarinal and bilateral hilar lymphadenopathy, new/progressive. 3. New hypermetabolic nodularity in the lower left breast is compatible with progressive malignancy. 4. New hypermetabolic mixed lytic and sclerotic left pelvic bone metastases as detailed. 5. Persistent patchy hypermetabolism in the anterior mediastinum associated with mixed soft tissue and fat density, favor thymic  hyperplasia.   09/17/2021 - 10/07/2021 Chemotherapy   Patient is on Treatment Plan : BREAST METASTATIC fam-trastuzumab deruxtecan-nxki (Enhertu) q21d      09/17/2021 - 10/29/2021 Chemotherapy   Patient is on Treatment Plan : BREAST METASTATIC Fam-Trastuzumab Deruxtecan-nxki (Enhertu) (5.4) q21d     11/11/2021 PET scan    1. Hypermetabolic left breast soft tissue and skin thickening is increased  from prior examination and suspicious for worsening primary malignancy.   2. There is evidence of interval worsening hypermetabolic adenopathy.  Primary differential consideration is worsening nodal metastasis, however  given the distribution the possibility of sarcoid-like reaction in the  setting of therapy is raised. There is worsening hypermetabolic left  axillary adenopathy with new areas of hypermetabolic adenopathy seen in the  left subpectoral, internal mammary, cardiophrenic, right axillary,  mediastinal, and bilateral hilar nodal stations. A few nonspecific FDG avid  nonenlarged left pelvic/inguinal nodes are also seen. Tissue sampling  should be considered for definitive assessment, otherwise close follow-up  is recommended.   3. Interval increased hypermetabolic activity associated with osseous  metastasis in the left hemipelvis. Other nonavid sclerotic bone lesions may  reflect sites of treated disease.   Electronically Reviewed by:  Mahala Menghini, MD, [facility]  Electronically Reviewed on:  11/11/2021 6:13 PM    11/19/2021 -  Chemotherapy   Patient is on Treatment Plan : BREAST Carboplatin + Gemcitabine D1,8 q21d       CURRENT THERAPY: Ellen [name]  INTERVAL HISTORY: Ellen [name] 26 y.o. female returns for follow-up and evaluation prior to receiving carbo Gemzar.  She underwent PET scan on November 11, 2021 that was compatible with metastatic disease progression and treatment was changed to [place] Gemzar to be given on days 1 and 8 of a 21-day cycle.  Today she is here for cycle 1 day 8 of therapy.  Ellen [name] tells me that she is tolerating [place] Gemzar well.  She feels less fatigue.  She has had no nausea  vomiting or diarrhea.  She noted some small mucosal changes in the upper gums but no dental concerns.    Ellen [name] also receives Ellen [name] every 12 weeks--most recently on 10/29/2021.  She is tolerating this well.  She denies any concerns for osteonecrosis.    Patient Active Problem List   Diagnosis Date Noted   Port-A-Cath in place 06/03/2021   AKI (acute kidney injury) ([place]) 09/09/2020   Genetic testing 09/06/2020   Family history of thyroid [name] 08/29/2020   Family history of pancreatic [name] 08/29/2020   Carcinoma of breast metastatic to bone ([place]) 08/17/2020   Primary malignant neoplasm of breast with metastasis ([place]) 08/16/2020   Left breast mass 08/13/2020   Genital herpes 08/13/2020   Attention deficit hyperactivity disorder (ADHD), 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   GAD (generalized anxiety disorder) 02/16/2017   Marijuana user 02/16/2017   Mild intermittent asthma without complication 64/68/0321   Severe episode of recurrent major depressive disorder, without psychotic features ([place]) 02/16/2017   Depression 04/20/2012    is allergic to other.  MEDICAL HISTORY: Past Medical History:  Diagnosis Date   ADHD (attention deficit hyperactivity disorder)    Anemia    Anxiety    Asthma    Exercise Induced   [name] ([place])    Depression    Family history of pancreatic [name]    Family history of thyroid [name]    GERD (gastroesophageal reflux disease)    Herpes simplex    Personal history of chemotherapy 08/2020  SURGICAL HISTORY: Past Surgical History:  Procedure Laterality Date   BREAST BIOPSY Left 08/2020   PORTACATH PLACEMENT Right 08/23/2020   Procedure: INSERTION PORT-A-CATH;  Surgeon: [name], MD;  Location: [facility];  Service: General;  Laterality: Right;   ROBOTIC ASSISTED BILATERAL SALPINGO OOPHERECTOMY Bilateral 07/16/2021   Procedure: XI ROBOTIC ASSISTED BILATERAL SALPINGO OOPHORECTOMY;  Surgeon: Lafonda Mosses, MD;   Location: [facility];  Service: Gynecology;  Laterality: Bilateral;    SOCIAL HISTORY: Social History   Socioeconomic History   Marital status: Single    Spouse name: Not on file   Number of children: Not on file   Years of education: Not on file   Highest education level: Not on file  Occupational History   Not on file  Tobacco Use   Smoking status: Never   Smokeless tobacco: Never  Vaping Use   Vaping Use: Every day   Substances: THC  Substance and Sexual Activity   Alcohol use: Not Currently    Comment: 3 to 4 a week   Drug use: Yes    Types: Marijuana    Comment: Daily   Sexual activity: Yes  Other Topics Concern   Not on file  Social History Narrative   Menarche at age 16, has a cycles every 28 days that lasts about 11 days, 4 days of spotting, two days of heavy flow, and the other days mild to moderate flow.  Received Depo provera shot x 3 years, Nexplanon x 2 years, and currently has kyleena IUD.  No OCPs.  She has never been pregnant.    Social Determinants of Health   Financial Resource Strain: High Risk (05/08/2021)   Overall Financial Resource Strain (CARDIA)    Difficulty of Paying Living Expenses: Very hard  Food Insecurity: No Food Insecurity (08/13/2020)   Hunger Vital Sign    Worried About Running Out of Food in the Last Year: Never true    Ran Out of Food in the Last Year: Never true  Transportation Needs: No Transportation Needs (08/13/2020)   PRAPARE - Hydrologist (Medical): No    Lack of Transportation (Non-Medical): No  Physical Activity: Not on file  Stress: Not on file  Social Connections: Not on file  Intimate Partner Violence: Not on file    FAMILY HISTORY: Family History  Problem Relation Age of Onset   Depression Mother    Hyperlipidemia Mother    Thyroid [name] Mother 93       papillary thyroid [name]   Hypertension Father    Atrial fibrillation Father    Irritable bowel syndrome Father        also brother    ADD / ADHD Brother    Bipolar disorder Brother    Diabetes Maternal Grandfather    Heart attack Paternal Great-grandfather 31   Pancreatic [name] Maternal Great-grandfather 88       (MGM's father)   Colon [name] Neg Hx    Breast [name] Neg Hx    Ovarian [name] Neg Hx    Endometrial [name] Neg Hx    Prostate [name] Neg Hx     Review of Systems  Constitutional:  Negative for appetite change, chills, fatigue, fever and unexpected weight change.  HENT:   Negative for hearing loss, lump/mass and trouble swallowing.   Eyes:  Negative for eye problems and icterus.  Respiratory:  Negative for chest tightness, cough and shortness of breath.   Cardiovascular:  Negative for chest pain,  leg swelling and palpitations.  Gastrointestinal:  Negative for abdominal distention, abdominal pain, constipation, diarrhea, nausea and vomiting.  Endocrine: Negative for hot flashes.  Genitourinary:  Negative for difficulty urinating.   Musculoskeletal:  Negative for arthralgias.  Skin:  Negative for itching and rash.  Neurological:  Negative for dizziness, extremity weakness, headaches and numbness.  Hematological:  Negative for adenopathy. Does not bruise/bleed easily.  Psychiatric/Behavioral:  Negative for depression. The patient is not nervous/anxious.       PHYSICAL EXAMINATION  ECOG PERFORMANCE STATUS: 1 - Symptomatic but completely ambulatory  Vitals:   11/27/21 1021  BP: 137/83  Pulse: (!) 111  Resp: 17  Temp: 97.7 F (36.5 C)  SpO2: 100%    Physical Exam Constitutional:      General: She is not in acute distress.    Appearance: Normal appearance. She is not toxic-appearing.  HENT:     Head: Normocephalic and atraumatic.  Eyes:     General: No scleral icterus. Cardiovascular:     Rate and Rhythm: Normal rate and regular rhythm.     Pulses: Normal pulses.     Heart sounds: Normal heart sounds.  Pulmonary:     Effort: Pulmonary effort is normal.     Breath sounds: Normal breath  sounds.  Abdominal:     General: Abdomen is flat. Bowel sounds are normal. There is no distension.     Palpations: Abdomen is soft.     Tenderness: There is no abdominal tenderness.  Musculoskeletal:        General: No swelling.     Cervical back: Neck supple.  Lymphadenopathy:     Cervical: No cervical adenopathy.  Skin:    General: Skin is warm and dry.     Findings: No rash.  Neurological:     General: No focal deficit present.     Mental Status: She is alert.  Psychiatric:        Mood and Affect: Mood normal.        Behavior: Behavior normal.     LABORATORY DATA:  CBC    Component Value Date/Time   WBC 3.2 (L) 11/27/2021 1010   WBC 4.2 10/07/2021 0955   RBC 3.79 (L) 11/27/2021 1010   HGB 11.7 (L) 11/27/2021 1010   HCT 33.7 (L) 11/27/2021 1010   PLT 166 11/27/2021 1010   MCV 88.9 11/27/2021 1010   MCH 30.9 11/27/2021 1010   MCHC 34.7 11/27/2021 1010   RDW 13.6 11/27/2021 1010   LYMPHSABS 1.2 11/27/2021 1010   MONOABS 0.1 11/27/2021 1010   EOSABS 0.1 11/27/2021 1010   BASOSABS 0.0 11/27/2021 1010    CMP     Component Value Date/Time   NA 140 11/27/2021 1010   K 4.2 11/27/2021 1010   CL 108 11/27/2021 1010   CO2 27 11/27/2021 1010   GLUCOSE 123 (H) 11/27/2021 1010   BUN 11 11/27/2021 1010   CREATININE 0.67 11/27/2021 1010   CALCIUM 9.0 11/27/2021 1010   PROT 6.8 11/27/2021 1010   ALBUMIN 4.0 11/27/2021 1010   AST 25 11/27/2021 1010   ALT 21 11/27/2021 1010   ALKPHOS 54 11/27/2021 1010   BILITOT 0.2 (L) 11/27/2021 1010   GFRNONAA >60 11/27/2021 1010       ASSESSMENT and THERAPY PLAN:   Carcinoma of breast metastatic to bone [facility] [place] [facility]) Ellen [name] is here for follow-up and evaluation of her metastatic breast [name].  She is status post cycle 1 day 1 Gemzar [place] and is here today to proceed  with cycle 1 day 8 of therapy.  She tolerated it moderately well.  Her current full CBC results are pending, however she has a slightly decreased white blood cell  count.  Her c-Met is pending at the time of visit completion.  She tolerated treatment very well and will continue to receive this as scheduled so long as her labs are within parameters.  She also receives Ellen [name] given every 12 weeks and this will not be due until December.  She is working on processing the stress related to her diagnosis and developing boundaries in her relationships through seeing a therapist and also a relationships class.  Her mood is much better since I saw her last.  Ellen [name] will return in 2 weeks for labs, follow-up with Dr. Lindi Adie, cycle 2-day 1 of therapy.    All questions were answered. The patient knows to call the clinic with any problems, questions or concerns. We can certainly see the patient much sooner if necessary.  Total encounter time:20 minutes*in face-to-face visit time, chart review, lab review, care coordination, order entry, and documentation of the encounter time.    Ellen Bihari, NP 11/27/21 11:04 AM Medical Oncology and Hematology [facility] [address] [name], [place] [address] Tel. [phone]    Fax. [phone]  *Total Encounter Time as defined by the Centers for Medicare and Medicaid Services includes, in addition to the face-to-face time of a patient visit (documented in the note above) non-face-to-face time: obtaining and reviewing outside history, ordering and reviewing medications, tests or procedures, care coordination (communications with other health care professionals or caregivers) and documentation in the medical record.

## 2021-12-05 ENCOUNTER — Encounter: Payer: Self-pay | Admitting: Adult Health

## 2021-12-05 NOTE — Progress Notes (Signed)
Patient Care Team: Pcp, No as PCP - General [name], MD as Consulting Physician (Hematology and Oncology) [name], [name], NP as Nurse Practitioner (Hematology and Oncology) [name], MD as Consulting Physician (General Surgery)  DIAGNOSIS:  Encounter Diagnoses  Name Primary?   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  Prominent left axillary lymph node cluster of adjacent lymph nodes, irregular nodule right middle lobe 1.1 cm, aggressive lesion right second rib cortical destruction, multiple lucent lesions throughout the thoracic spine including T1 vertebral body and T2, irregular multilobar lesion central left hepatic lobe 4.3 x 4.7 cm.  Several satellite lesions in the left lateral hepatic lobe.  Multiple sclerotic lesions in the bones iliac wings, acetabular, medial iliac bones, sacrum multiple lesions lumbar spine L3-L4 and L5   08/17/2020 Initial Diagnosis   Biopsy revealed IDC with DCIS grade 2, ER 30%, PR 20%, HER2 equivocal by IHC, FISH positive, Ki-67 25%, lymph node positive    Genetic Testing   Negative genetic testing:  No pathogenic variants detected on the [employer] CancerNext-Expanded + RNAinsight panel. The report date is 09/06/2020.   The CancerNext-Expanded + RNAinsight gene panel offered by [employer] and includes sequencing and rearrangement analysis for the following 77 genes: AIP, ALK, APC, ATM, AXIN2, BAP1, BARD1, BLM, BMPR1A, BRCA1, BRCA2, BRIP1, CDC73, CDH1, CDK4, CDKN1B, CDKN2A, CHEK2, CTNNA1, DICER1, FANCC, FH, FLCN, GALNT12, KIF1B, LZTR1, MAX, MEN1, MET, MLH1, MSH2, MSH3, MSH6, MUTYH, NBN, NF1, NF2, NTHL1, PALB2, PHOX2B, PMS2, POT1, PRKAR1A, PTCH1, PTEN, RAD51C, RAD51D, RB1, RECQL,  RET, SDHA, SDHAF2, SDHB, SDHC, SDHD, SMAD4, SMARCA4, SMARCB1, SMARCE1, STK11, SUFU, TMEM127, TP53, TSC1, TSC2, VHL and XRCC2 (sequencing and deletion/duplication); EGFR, EGLN1, HOXB13, KIT, MITF, PDGFRA, POLD1 and POLE (sequencing only); EPCAM and GREM1 (deletion/duplication only). RNA data is routinely analyzed for use in variant interpretation for all genes.   09/17/2021 - 10/07/2021 Chemotherapy   Patient is on Treatment Plan : BREAST METASTATIC fam-trastuzumab deruxtecan-nxki (Enhertu) q21d     09/17/2021 - 10/29/2021 Chemotherapy   Patient is on Treatment Plan : BREAST METASTATIC Fam-Trastuzumab Deruxtecan-nxki (Enhertu) (5.4) q21d     11/19/2021 -  Chemotherapy   Patient is on Treatment Plan : BREAST Carboplatin + Gemcitabine D1,8 q21d     Carcinoma of breast metastatic to bone ([place])  08/17/2020 Initial Diagnosis   Carcinoma of breast metastatic to bone ([place]); Goserelin/Zoladex started and to be given every 4 weeks    Genetic Testing   Negative genetic testing:  No pathogenic variants detected on the [employer] CancerNext-Expanded + RNAinsight panel. The report date is 09/06/2020.   The CancerNext-Expanded + RNAinsight gene panel offered by [employer] and includes sequencing and rearrangement analysis for the following 77 genes: AIP, ALK, APC, ATM, AXIN2, BAP1, BARD1, BLM, BMPR1A, BRCA1, BRCA2, BRIP1, CDC73, CDH1, CDK4, CDKN1B, CDKN2A, CHEK2, CTNNA1, DICER1, FANCC, FH, FLCN, GALNT12, KIF1B, LZTR1, MAX, MEN1, MET, MLH1, MSH2, MSH3, MSH6, MUTYH, NBN, NF1, NF2, NTHL1, PALB2, PHOX2B, PMS2, POT1, PRKAR1A, PTCH1, PTEN, RAD51C, RAD51D, RB1, RECQL, RET, SDHA, SDHAF2, SDHB, SDHC, SDHD, SMAD4, SMARCA4, SMARCB1, SMARCE1, STK11, SUFU, TMEM127, TP53, TSC1, TSC2, VHL and XRCC2 (sequencing and deletion/duplication); EGFR, EGLN1, HOXB13, KIT, MITF, PDGFRA, POLD1 and POLE (sequencing only); EPCAM and GREM1 (deletion/duplication only). RNA data is routinely analyzed  for use in variant interpretation for all genes.    08/18/2020 - 08/30/2020 Radiation Therapy   Palliative radiation to the lumbar and pelvic bone; 30 Gy in 10 fractions   08/31/2020 - 12/18/2020 Adjuvant Chemotherapy   Started with Herceptin/Perjeta on 08/31/2020 and Taxotere on 09/04/2020; she was hospitalized with dehydration and refractory diarrhea for 11 days, she resumed treatment with Taxotere (dose reduced by 47m/m2) and Herceptin only (perjeta discontinued) on 09/26/2020, reintroduced 10/17/2020   09/03/2020 Cancer Staging   Staging form: Breast, AJCC 8th Edition - Clinical stage from 09/03/2020: Stage IV (cT4b, cN1, cM1, G2, ER+, PR+, HER2+) - Signed by CGardenia Phlegm NP on 10/03/2020 Stage prefix: Initial diagnosis Method of lymph node assessment: Clinical Histologic grading system: 3 grade system   01/08/2021 - 08/27/2021 Adjuvant Chemotherapy   Herceptin/Perjeta every 3 weeks, Zometa every 12 weeks added 01/29/2021   07/07/2021 PET scan   1. Interval decrease in size of the dominant left breast mass which now exhibits mild low level FDG uptake with SUV max of 2.7. Small nodule within the left breast exhibits moderate tracer uptake within SUV max of 5.6 and is suspicious for residual/recurrent local tumor. 2. Mild tracer uptake associated with prominent left axillary lymph node with SUV max of 3.35. Residual tracer avid nodal metastasis not excluded. 3. The previously noted liver metastasis are no longer visible on the CT images from today's study. There is no abnormal increased uptake within the left hepatic lobe above background liver activity to suggest metabolically active tumor metastasis. 4. Interval increase in sclerosis associated with previously noted lytic bone metastasis without significant increased uptake above background bone marrow activity compatible with healing multifocal bone metastasis. 5. New pathologic fracture involves the L3 vertebral body where there was extensive lytic changes noted on the  previous CT. The collapsed vertebral body is now sclerotic with mild low level FDG uptake. 6. Similar appearance of soft tissue infiltration within the anterior mediastinal fat which exhibits mild FDG uptake on today's study. 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   07/16/2021 Surgery   S/p BSO, benign pathology    08/26/2021 Mammogram   Mammo diagnostic breast tomosynthesis left: There is an additional  irregular mass in the lower inner position at posterior depth. This was  further evaluated with ultrasound.   Mammo UKoreabreast limited left: There is a 0.7 cm x 0.5 cm x 0.7 cm  irregularly shaped, hypoechoic mass with indistinct margins seen in the  left breast at 8 o'clock, 8 cm from the nipple. This mass is 1.6 cm medial  to the palpable mass as detailed above.    08/26/2021 Pathology Results   New left breast mass 8:00 biopsy: Invasive carcinoma with mixed ductal and lobular and focal micropapillary features.  Grade 3, invasive carcinoma measures 9 mm with an associated lymphoid reaction.  ER negative PR negative HER2 1+.  Left breast mass 2:00 biopsy: Invasive carcinoma with mixed ductal and lobular features Nottingham grade 2 measuring 1.4 cm.  ER 9% (low positive), PR negative, HER2 1+.   09/16/2021 PET scan   1. Interval progression of hypermetabolic metastatic disease. 2. Hypermetabolic bilateral axillary lymphadenopathy, subcarinal and bilateral hilar lymphadenopathy, new/progressive. 3. New hypermetabolic nodularity in the lower left breast is compatible with progressive malignancy. 4. New hypermetabolic mixed lytic and sclerotic left pelvic bone metastases as detailed. 5. Persistent patchy hypermetabolism in the anterior mediastinum associated with mixed soft tissue and fat density, favor thymic hyperplasia.   09/17/2021 - 10/07/2021 Chemotherapy  Patient is on Treatment Plan : BREAST METASTATIC fam-trastuzumab deruxtecan-nxki (Enhertu) q21d      09/17/2021 - 10/29/2021 Chemotherapy   Patient is on Treatment Plan : BREAST METASTATIC Fam-Trastuzumab Deruxtecan-nxki (Enhertu) (5.4) q21d     11/11/2021 PET scan    1. Hypermetabolic left breast soft tissue and skin thickening is increased  from prior examination and suspicious for worsening primary malignancy.   2. There is evidence of interval worsening hypermetabolic adenopathy.  Primary differential consideration is worsening nodal metastasis, however  given the distribution the possibility of sarcoid-like reaction in the  setting of therapy is raised. There is worsening hypermetabolic left  axillary adenopathy with new areas of hypermetabolic adenopathy seen in the  left subpectoral, internal mammary, cardiophrenic, right axillary,  mediastinal, and bilateral hilar nodal stations. A few nonspecific FDG avid  nonenlarged left pelvic/inguinal nodes are also seen. Tissue sampling  should be considered for definitive assessment, otherwise close follow-up  is recommended.   3. Interval increased hypermetabolic activity associated with osseous  metastasis in the left hemipelvis. Other nonavid sclerotic bone lesions may  reflect sites of treated disease.   Electronically Reviewed by:  Mahala Menghini, MD, [facility]  Electronically Reviewed on:  11/11/2021 6:13 PM    11/19/2021 -  Chemotherapy   Patient is on Treatment Plan : BREAST Carboplatin + Gemcitabine D1,8 q21d       CHIEF COMPLIANT: follow-up and evaluation of her metastatic breast cancer. Cycle 2 Gemzar and Carbo  INTERVAL HISTORY: Ellen [name] is a  26 y.o. female with the above-mentioned. Currently on Carbo Gemzar ,[place] See. She presents to the clinic for a follow-up. She reports that she has been having palpations after the second dose. She feels it every time she walking. She states that she gets fatigue nauseous and heart is pounding. Heart rate is going up to 140. Denies shortness of breath.   ALLERGIES:  is  allergic to other.  MEDICATIONS:  Current Outpatient Medications  Medication Sig Dispense Refill   acyclovir (ZOVIRAX) 800 MG tablet Take 1 tablet (800 mg total) by mouth 2 (two) times daily as needed (flare up). 60 tablet 5   acyclovir ointment (ZOVIRAX) 5 % Apply 1 application. topically 4 (four) times daily as needed (outbreak).     albuterol (VENTOLIN HFA) 108 (90 Base) MCG/ACT inhaler Inhale 1 puff every 4 hours by inhalation route.     Calcium Carb-Cholecalciferol 500-10 MG-MCG TABS Take by mouth.     Cholecalciferol 50 MCG (2000 UT) CAPS Take by mouth.     lidocaine-prilocaine (EMLA) cream      lisdexamfetamine (VYVANSE) 40 MG capsule Take 1 capsule by mouth daily.     LORazepam (ATIVAN) 0.5 MG tablet Take 1 tablet (0.5 mg total) by mouth every 8 (eight) hours as needed for anxiety. 30 tablet 0   magnesium oxide (MAG-OX) 400 (240 Mg) MG tablet Take 1 tablet (400 mg total) by mouth daily. 30 tablet 3   ondansetron (ZOFRAN) 8 MG tablet Take 1 tablet (8 mg total) by mouth every 8 (eight) hours as needed for nausea or vomiting. 20 tablet 0   pantoprazole (PROTONIX) 40 MG tablet Take 1 tablet (40 mg total) by mouth daily. 30 tablet 3   prochlorperazine (COMPAZINE) 10 MG tablet Take 1 tablet (10 mg total) by mouth every 6 (six) hours as needed for nausea or vomiting. 30 tablet 0   venlafaxine XR (EFFEXOR-XR) 150 MG 24 hr capsule Take 1 capsule (150 mg total) by mouth daily with  breakfast. 30 capsule 6   No current facility-administered medications for this visit.    PHYSICAL EXAMINATION: ECOG PERFORMANCE STATUS: 1 - Symptomatic but completely ambulatory  Vitals:   12/09/21 0808  BP: (!) 151/68  Pulse: (!) 135  Resp: 18  Temp: (!) 97.5 F (36.4 C)  SpO2: 100%   Filed Weights   12/09/21 0808  Weight: 143 lb 12.8 oz (65.2 kg)     LABORATORY DATA:  I have reviewed the data as listed    Latest Ref Rng & Units 11/27/2021   10:10 AM 11/19/2021    1:49 PM 10/29/2021    2:53 PM   CMP  Glucose 70 - 99 mg/dL 123  119  114   BUN 6 - 20 mg/dL _0 Creatinine 0.44 - 1.00 mg/dL 0.67  0.81  0.90   Sodium 135 - 145 mmol/L 140  141  139   Potassium 3.5 - 5.1 mmol/L 4.2  4.1  4.4   Chloride 98 - 111 mmol/L 108  112  107   CO2 22 - 32 mmol/L _1 Calcium 8.9 - 10.3 mg/dL 9.0  8.6  9.5   Total Protein 6.5 - 8.1 g/dL 6.8  6.3  7.2   Total Bilirubin 0.3 - 1.2 mg/dL 0.2  0.4  0.4   Alkaline Phos 38 - 126 U/L 54  63  65   AST 15 - 41 U/L _2 ALT 0 - 44 U/L _3 Lab Results  Component Value Date   WBC 1.4 (L) 12/09/2021   HGB 9.9 (L) 12/09/2021   HCT 28.1 (L) 12/09/2021   MCV 85.9 12/09/2021   PLT 32 (L) 12/09/2021   NEUTROABS 0.4 (LL) 12/09/2021    ASSESSMENT & PLAN:  Carcinoma of breast metastatic to bone ([place]) 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, FISH positive ratio 2.8, Ki-67 25%, lymph node positive   Palliative radiation completed 09/03/2020 Taxotere/Herceptin/Perjeta 08/31/2020-12/18/2020 Herceptin Perjeta Maintenance  beginning 01/08/2021-08/27/2021 Enhertu: 09/17/2021-10/29/2021: Discontinued for progression     PET/CT 07/07/2021: Interval decrease in the left breast mass having an SUV of 2.7, small nodule left breast SUV 5.6, left axillary lymph node SUV 3.35, liver metastases no longer visible.  Sclerosis involving bone metastases.  New compression fracture L3 vertebral spine no sclerotic   Biopsy of the breast at [facility] on 08/26/2021: IDC with lobular features ER 90%, PR 0%, HER2 1+ (low) ------------------------------------------------------------------------------------------------------------------------------------ Current treatment: Carboplatin and gemcitabine, today cycle 2-day 1 Toxicities:  1.  Pancytopenia: ANC 0.4, platelets 32, hemoglobin  9.9: We will not be able to do today's treatment. 2. reassess labs next week to see if she can receive treatment. 3.  Palpitations: Related to sudden drop in the hemoglobin.  There is no need for blood transfusions at this level.  For the next cycle of chemotherapy if her counts recover we will be able to resume it with reduced dosage and administration of G-CSF after the day 8 of chemo.  Return to clinic in 1 week to recheck labs and to see if she can get cycle 2.    Orders Placed This Encounter  Procedures   CBC with Differential ([place] Only)    Standing Status:  Future    Standing Expiration Date:   12/17/2022   CMP ([place] only)    Standing Status:   Future    Standing Expiration Date:   12/17/2022   The patient has a good understanding of the overall plan. she agrees with it. she will call with any problems that may develop before the next visit here. Total time spent: 30 mins including face to face time and time spent for planning, charting and co-ordination of care   Harriette Ohara, MD 12/09/21    I Gardiner Coins am scribing for Dr. Lindi Adie  I have reviewed the above documentation for accuracy and completeness, and I agree with the above.

## 2021-12-06 MED FILL — Dexamethasone Sodium Phosphate Inj 100 MG/10ML: INTRAMUSCULAR | Qty: 1 | Status: AC

## 2021-12-09 ENCOUNTER — Inpatient Hospital Stay (HOSPITAL_BASED_OUTPATIENT_CLINIC_OR_DEPARTMENT_OTHER): Payer: Medicaid Other | Admitting: Hematology and Oncology

## 2021-12-09 ENCOUNTER — Inpatient Hospital Stay: Payer: Medicaid Other

## 2021-12-09 VITALS — BP 151/68 | HR 135 | Temp 97.5°F | Resp 18 | Ht 65.0 in | Wt 143.8 lb

## 2021-12-09 DIAGNOSIS — C50912 Malignant neoplasm of unspecified site of left female breast: Secondary | ICD-10-CM

## 2021-12-09 DIAGNOSIS — C50919 Malignant neoplasm of unspecified site of unspecified female breast: Secondary | ICD-10-CM

## 2021-12-09 DIAGNOSIS — Z5111 Encounter for antineoplastic chemotherapy: Secondary | ICD-10-CM | POA: Diagnosis not present

## 2021-12-09 DIAGNOSIS — C7951 Secondary malignant neoplasm of bone: Secondary | ICD-10-CM

## 2021-12-09 LAB — CBC WITH DIFFERENTIAL (CANCER CENTER ONLY)
Abs Immature Granulocytes: 0.01 10*3/uL (ref 0.00–0.07)
Basophils Absolute: 0 10*3/uL (ref 0.0–0.1)
Basophils Relative: 0 %
Eosinophils Absolute: 0 10*3/uL (ref 0.0–0.5)
Eosinophils Relative: 2 %
HCT: 28.1 % — ABNORMAL LOW (ref 36.0–46.0)
Hemoglobin: 9.9 g/dL — ABNORMAL LOW (ref 12.0–15.0)
Immature Granulocytes: 1 %
Lymphocytes Relative: 65 %
Lymphs Abs: 1 10*3/uL (ref 0.7–4.0)
MCH: 30.3 pg (ref 26.0–34.0)
MCHC: 35.2 g/dL (ref 30.0–36.0)
MCV: 85.9 fL (ref 80.0–100.0)
Monocytes Absolute: 0.1 10*3/uL (ref 0.1–1.0)
Monocytes Relative: 4 %
Neutro Abs: 0.4 10*3/uL — CL (ref 1.7–7.7)
Neutrophils Relative %: 28 %
Platelet Count: 32 10*3/uL — ABNORMAL LOW (ref 150–400)
RBC: 3.27 MIL/uL — ABNORMAL LOW (ref 3.87–5.11)
RDW: 12.7 % (ref 11.5–15.5)
WBC Count: 1.4 10*3/uL — ABNORMAL LOW (ref 4.0–10.5)
nRBC: 0 % (ref 0.0–0.2)

## 2021-12-09 LAB — CMP (CANCER CENTER ONLY)
ALT: 134 U/L — ABNORMAL HIGH (ref 0–44)
AST: 78 U/L — ABNORMAL HIGH (ref 15–41)
Albumin: 4 g/dL (ref 3.5–5.0)
Alkaline Phosphatase: 52 U/L (ref 38–126)
Anion gap: 5 (ref 5–15)
BUN: 9 mg/dL (ref 6–20)
CO2: 25 mmol/L (ref 22–32)
Calcium: 9.2 mg/dL (ref 8.9–10.3)
Chloride: 108 mmol/L (ref 98–111)
Creatinine: 0.68 mg/dL (ref 0.44–1.00)
GFR, Estimated: >60 mL/min (ref >60)
Glucose, Bld: 131 mg/dL — ABNORMAL HIGH (ref 70–99)
Potassium: 3.7 mmol/L (ref 3.5–5.1)
Sodium: 138 mmol/L (ref 135–145)
Total Bilirubin: 0.3 mg/dL (ref 0.3–1.2)
Total Protein: 7 g/dL (ref 6.5–8.1)

## 2021-12-09 NOTE — Assessment & Plan Note (Addendum)
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, ER 30%, PR 20%, HER2 equivocal by IHC, FISH positiveratio 2.8, Ki-67 25%, lymph node positive  Palliative radiation completed 09/03/2020 Taxotere/Herceptin/Perjeta 08/31/2020-12/18/2020 Herceptin Perjeta Maintenance beginning 01/08/2021-08/27/2021 Enhertu: 09/17/2021-10/29/2021: Discontinued for progression   PET/CT 07/07/2021: Interval decrease in the left breast mass having an SUV of 2.7, small nodule left breast SUV 5.6, left axillary lymph node SUV 3.35, liver metastases no longer visible. Sclerosis involving bone metastases. New compression fracture L3 vertebral spine no sclerotic  Biopsy of the breast at [facility] on 08/26/2021:IDC with lobular features ER 90%, PR 0%, HER2 1+ (low) ------------------------------------------------------------------------------------------------------------------------------------ Current treatment: Carboplatin and gemcitabine, today cycle 2-day 1 Toxicities:  1.  Pancytopenia: ANC 0.4, platelets 32, hemoglobin 9.9: We will not be able to do today's treatment. 2. reassess labs next week to see if she can receive treatment. 3.  Palpitations: Related to sudden drop in the hemoglobin.  There is no need for blood transfusions at this level.  For the next cycle of chemotherapy if her counts recover we will be able to resume it with reduced dosage and administration of G-CSF after the day 8 of chemo.  Return to clinic in 1 week to recheck labs and to see if she can get cycle 2.

## 2021-12-13 MED FILL — Dexamethasone Sodium Phosphate Inj 100 MG/10ML: INTRAMUSCULAR | Qty: 1 | Status: AC

## 2021-12-13 NOTE — Progress Notes (Signed)
Patient Care Team: Pcp, No as PCP - General [name], MD as Consulting Physician (Hematology and Oncology) [name], [name], NP as Nurse Practitioner (Hematology and Oncology) [name], MD as Consulting Physician (General Surgery)  DIAGNOSIS: No diagnosis found.  SUMMARY OF ONCOLOGIC HISTORY: Oncology History  Primary malignant neoplasm of breast with metastasis ([place])  08/15/2020 Imaging   Large mass in the medial left breast measuring 3.5 cm.  Prominent left axillary lymph node cluster of adjacent lymph nodes, irregular nodule right middle lobe 1.1 cm, aggressive lesion right second rib cortical destruction, multiple lucent lesions throughout the thoracic spine including T1 vertebral body and T2, irregular multilobar lesion central left hepatic lobe 4.3 x 4.7 cm.  Several satellite lesions in the left lateral hepatic lobe.  Multiple sclerotic lesions in the bones iliac wings, acetabular, medial iliac bones, sacrum multiple lesions lumbar spine L3-L4 and L5   08/17/2020 Initial Diagnosis   Biopsy revealed IDC with DCIS grade 2, ER 30%, PR 20%, HER2 equivocal by IHC, FISH positive, Ki-67 25%, lymph node positive    Genetic Testing   Negative genetic testing:  No pathogenic variants detected on the [facility] CancerNext-Expanded + RNAinsight panel. The report date is 09/06/2020.   The CancerNext-Expanded + RNAinsight gene panel offered by [employer] and includes sequencing and rearrangement analysis for the following 77 genes: AIP, ALK, APC, ATM, AXIN2, BAP1, BARD1, BLM, BMPR1A, BRCA1, BRCA2, BRIP1, CDC73, CDH1, CDK4, CDKN1B, CDKN2A, CHEK2, CTNNA1, DICER1, FANCC, FH, FLCN, GALNT12, KIF1B, LZTR1, MAX, MEN1, MET, MLH1, MSH2, MSH3, MSH6, MUTYH, NBN, NF1, NF2, NTHL1, PALB2, PHOX2B, PMS2, POT1, PRKAR1A, PTCH1, PTEN, RAD51C, RAD51D, RB1, RECQL, RET, SDHA, SDHAF2, SDHB, SDHC, SDHD, SMAD4, SMARCA4, SMARCB1, SMARCE1, STK11, SUFU, TMEM127, TP53, TSC1, TSC2, VHL and XRCC2 (sequencing and  deletion/duplication); EGFR, EGLN1, HOXB13, KIT, MITF, PDGFRA, POLD1 and POLE (sequencing only); EPCAM and GREM1 (deletion/duplication only). RNA data is routinely analyzed for use in variant interpretation for all genes.   09/17/2021 - 10/07/2021 Chemotherapy   Patient is on Treatment Plan : BREAST METASTATIC fam-trastuzumab deruxtecan-nxki (Enhertu) q21d     09/17/2021 - 10/29/2021 Chemotherapy   Patient is on Treatment Plan : BREAST METASTATIC Fam-Trastuzumab Deruxtecan-nxki (Enhertu) (5.4) q21d     11/19/2021 -  Chemotherapy   Patient is on Treatment Plan : BREAST Carboplatin + Gemcitabine D1,8 q21d     Carcinoma of breast metastatic to bone ([place])  08/17/2020 Initial Diagnosis   Carcinoma of breast metastatic to bone ([place]); Goserelin/Zoladex started and to be given every 4 weeks    Genetic Testing   Negative genetic testing:  No pathogenic variants detected on the [facility] CancerNext-Expanded + RNAinsight panel. The report date is 09/06/2020.   The CancerNext-Expanded + RNAinsight gene panel offered by [employer] and includes sequencing and rearrangement analysis for the following 77 genes: AIP, ALK, APC, ATM, AXIN2, BAP1, BARD1, BLM, BMPR1A, BRCA1, BRCA2, BRIP1, CDC73, CDH1, CDK4, CDKN1B, CDKN2A, CHEK2, CTNNA1, DICER1, FANCC, FH, FLCN, GALNT12, KIF1B, LZTR1, MAX, MEN1, MET, MLH1, MSH2, MSH3, MSH6, MUTYH, NBN, NF1, NF2, NTHL1, PALB2, PHOX2B, PMS2, POT1, PRKAR1A, PTCH1, PTEN, RAD51C, RAD51D, RB1, RECQL, RET, SDHA, SDHAF2, SDHB, SDHC, SDHD, SMAD4, SMARCA4, SMARCB1, SMARCE1, STK11, SUFU, TMEM127, TP53, TSC1, TSC2, VHL and XRCC2 (sequencing and deletion/duplication); EGFR, EGLN1, HOXB13, KIT, MITF, PDGFRA, POLD1 and POLE (sequencing only); EPCAM and GREM1 (deletion/duplication only). RNA data is routinely analyzed for use in variant interpretation for all genes.   08/18/2020 - 08/30/2020 Radiation Therapy   Palliative radiation to the lumbar and pelvic bone; 30 Gy  in 10 fractions   08/31/2020 - 12/18/2020  Adjuvant Chemotherapy   Started with Herceptin/Perjeta on 08/31/2020 and Taxotere on 09/04/2020; she was hospitalized with dehydration and refractory diarrhea for 11 days, she resumed treatment with Taxotere (dose reduced by 89m/m2) and Herceptin only (perjeta discontinued) on 09/26/2020, reintroduced 10/17/2020   09/03/2020 Cancer Staging   Staging form: Breast, AJCC 8th Edition - Clinical stage from 09/03/2020: Stage IV (cT4b, cN1, cM1, G2, ER+, PR+, HER2+) - Signed by CGardenia Phlegm NP on 10/03/2020 Stage prefix: Initial diagnosis Method of lymph node assessment: Clinical Histologic grading system: 3 grade system   01/08/2021 - 08/27/2021 Adjuvant Chemotherapy   Herceptin/Perjeta every 3 weeks, Zometa every 12 weeks added 01/29/2021   07/07/2021 PET scan   1. Interval decrease in size of the dominant left breast mass which now exhibits mild low level FDG uptake with SUV max of 2.7. Small nodule within the left breast exhibits moderate tracer uptake within SUV max of 5.6 and is suspicious for residual/recurrent local tumor. 2. Mild tracer uptake associated with prominent left axillary lymph node with SUV max of 3.35. Residual tracer avid nodal metastasis not excluded. 3. The previously noted liver metastasis are no longer visible on the CT images from today's study. There is no abnormal increased uptake within the left hepatic lobe above background liver activity to suggest metabolically active tumor metastasis. 4. Interval increase in sclerosis associated with previously noted lytic bone metastasis without significant increased uptake above background bone marrow activity compatible with healing multifocal bone metastasis. 5. New pathologic fracture involves the L3 vertebral body where there was extensive lytic changes noted on the previous CT. The collapsed vertebral body is now sclerotic with mild low level FDG uptake. 6. Similar appearance of soft tissue infiltration within  the anterior mediastinal fat which exhibits mild FDG uptake on today's study. 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   07/16/2021 Surgery   S/p BSO, benign pathology    08/26/2021 Mammogram   Mammo diagnostic breast tomosynthesis left: There is an additional  irregular mass in the lower inner position at posterior depth. This was  further evaluated with ultrasound.   Mammo UKoreabreast limited left: There is a 0.7 cm x 0.5 cm x 0.7 cm  irregularly shaped, hypoechoic mass with indistinct margins seen in the  left breast at 8 o'clock, 8 cm from the nipple. This mass is 1.6 cm medial  to the palpable mass as detailed above.    08/26/2021 Pathology Results   New left breast mass 8:00 biopsy: Invasive carcinoma with mixed ductal and lobular and focal micropapillary features.  Grade 3, invasive carcinoma measures 9 mm with an associated lymphoid reaction.  ER negative PR negative HER2 1+.  Left breast mass 2:00 biopsy: Invasive carcinoma with mixed ductal and lobular features Nottingham grade 2 measuring 1.4 cm.  ER 9% (low positive), PR negative, HER2 1+.   09/16/2021 PET scan   1. Interval progression of hypermetabolic metastatic disease. 2. Hypermetabolic bilateral axillary lymphadenopathy, subcarinal and bilateral hilar lymphadenopathy, new/progressive. 3. New hypermetabolic nodularity in the lower left breast is compatible with progressive malignancy. 4. New hypermetabolic mixed lytic and sclerotic left pelvic bone metastases as detailed. 5. Persistent patchy hypermetabolism in the anterior mediastinum associated with mixed soft tissue and fat density, favor thymic hyperplasia.   09/17/2021 - 10/07/2021 Chemotherapy   Patient is on Treatment Plan : BREAST METASTATIC fam-trastuzumab deruxtecan-nxki (Enhertu) q21d     09/17/2021 - 10/29/2021 Chemotherapy   Patient is on  Treatment Plan : BREAST METASTATIC Fam-Trastuzumab Deruxtecan-nxki (Enhertu) (5.4) q21d     11/11/2021 PET  scan    1. Hypermetabolic left breast soft tissue and skin thickening is increased  from prior examination and suspicious for worsening primary malignancy.   2. There is evidence of interval worsening hypermetabolic adenopathy.  Primary differential consideration is worsening nodal metastasis, however  given the distribution the possibility of sarcoid-like reaction in the  setting of therapy is raised. There is worsening hypermetabolic left  axillary adenopathy with new areas of hypermetabolic adenopathy seen in the  left subpectoral, internal mammary, cardiophrenic, right axillary,  mediastinal, and bilateral hilar nodal stations. A few nonspecific FDG avid  nonenlarged left pelvic/inguinal nodes are also seen. Tissue sampling  should be considered for definitive assessment, otherwise close follow-up  is recommended.   3. Interval increased hypermetabolic activity associated with osseous  metastasis in the left hemipelvis. Other nonavid sclerotic bone lesions may  reflect sites of treated disease.   Electronically Reviewed by:  Mahala Menghini, MD, [facility]  Electronically Reviewed on:  11/11/2021 6:13 PM    11/19/2021 -  Chemotherapy   Patient is on Treatment Plan : BREAST Carboplatin + Gemcitabine D1,8 q21d       CHIEF COMPLIANT: follow-up and evaluation of her metastatic breast cancer  INTERVAL HISTORY: Ellen [name] is a 26 y.o. female with the above-mentioned metastatic breast cancer. Currently on Carbo Gemzar ,Delton See. She presents to the clinic for a follow-up.   ALLERGIES:  is allergic to other.  MEDICATIONS:  Current Outpatient Medications  Medication Sig Dispense Refill   acyclovir (ZOVIRAX) 800 MG tablet Take 1 tablet (800 mg total) by mouth 2 (two) times daily as needed (flare up). 60 tablet 5   acyclovir ointment (ZOVIRAX) 5 % Apply 1 application. topically 4 (four) times daily as needed (outbreak).     albuterol (VENTOLIN HFA) 108 (90 Base) MCG/ACT inhaler  Inhale 1 puff every 4 hours by inhalation route.     Calcium Carb-Cholecalciferol 500-10 MG-MCG TABS Take by mouth.     Cholecalciferol 50 MCG (2000 UT) CAPS Take by mouth.     lidocaine-prilocaine (EMLA) cream      lisdexamfetamine (VYVANSE) 40 MG capsule Take 1 capsule by mouth daily.     LORazepam (ATIVAN) 0.5 MG tablet Take 1 tablet (0.5 mg total) by mouth every 8 (eight) hours as needed for anxiety. 30 tablet 0   magnesium oxide (MAG-OX) 400 (240 Mg) MG tablet Take 1 tablet (400 mg total) by mouth daily. 30 tablet 3   ondansetron (ZOFRAN) 8 MG tablet Take 1 tablet (8 mg total) by mouth every 8 (eight) hours as needed for nausea or vomiting. 20 tablet 0   pantoprazole (PROTONIX) 40 MG tablet Take 1 tablet (40 mg total) by mouth daily. 30 tablet 3   prochlorperazine (COMPAZINE) 10 MG tablet Take 1 tablet (10 mg total) by mouth every 6 (six) hours as needed for nausea or vomiting. 30 tablet 0   venlafaxine XR (EFFEXOR-XR) 150 MG 24 hr capsule Take 1 capsule (150 mg total) by mouth daily with breakfast. 30 capsule 6   No current facility-administered medications for this visit.    PHYSICAL EXAMINATION: ECOG PERFORMANCE STATUS: {CHL ONC ECOG [phone]}  There were no vitals filed for this visit. There were no vitals filed for this visit.  BREAST:*** No palpable masses or nodules in either right or left breasts. No palpable axillary supraclavicular or infraclavicular adenopathy no breast tenderness or nipple discharge. (exam  performed in the presence of a chaperone)  LABORATORY DATA:  I have reviewed the data as listed    Latest Ref Rng & Units 12/09/2021    7:54 AM 11/27/2021   10:10 AM 11/19/2021    1:49 PM  CMP  Glucose 70 - 99 mg/dL 131  123  119   BUN 6 - 20 mg/dL _0 Creatinine 0.44 - 1.00 mg/dL 0.68  0.67  0.81   Sodium 135 - 145 mmol/L 138  140  141   Potassium 3.5 - 5.1 mmol/L 3.7  4.2  4.1   Chloride 98 - 111 mmol/L 108  108  112   CO2 22 - 32 mmol/L _1 Calcium 8.9 - 10.3 mg/dL 9.2  9.0  8.6   Total Protein 6.5 - 8.1 g/dL 7.0  6.8  6.3   Total Bilirubin 0.3 - 1.2 mg/dL 0.3  0.2  0.4   Alkaline Phos 38 - 126 U/L 52  54  63   AST 15 - 41 U/L 78  25  21   ALT 0 - 44 U/L 134  21  17     Lab Results  Component Value Date   WBC 1.4 (L) 12/09/2021   HGB 9.9 (L) 12/09/2021   HCT 28.1 (L) 12/09/2021   MCV 85.9 12/09/2021   PLT 32 (L) 12/09/2021   NEUTROABS 0.4 (LL) 12/09/2021    ASSESSMENT & PLAN:  No problem-specific Assessment & Plan notes found for this encounter.    No orders of the defined types were placed in this encounter.  The patient has a good understanding of the overall plan. she agrees with it. she will call with any problems that may develop before the next visit here. Total time spent: 30 mins including face to face time and time spent for planning, charting and co-ordination of care   Suzzette Righter, [place] 12/13/21    I Gardiner Coins am scribing for Dr. Lindi Adie  ***

## 2021-12-16 ENCOUNTER — Other Ambulatory Visit: Payer: Self-pay | Admitting: Hematology and Oncology

## 2021-12-16 ENCOUNTER — Inpatient Hospital Stay (HOSPITAL_BASED_OUTPATIENT_CLINIC_OR_DEPARTMENT_OTHER): Payer: Medicaid Other | Admitting: Hematology and Oncology

## 2021-12-16 ENCOUNTER — Other Ambulatory Visit: Payer: Self-pay

## 2021-12-16 ENCOUNTER — Inpatient Hospital Stay: Payer: Medicaid Other

## 2021-12-16 VITALS — BP 99/71 | HR 89 | Temp 98.5°F | Resp 18

## 2021-12-16 DIAGNOSIS — C50912 Malignant neoplasm of unspecified site of left female breast: Secondary | ICD-10-CM

## 2021-12-16 DIAGNOSIS — Z5111 Encounter for antineoplastic chemotherapy: Secondary | ICD-10-CM | POA: Diagnosis not present

## 2021-12-16 DIAGNOSIS — C7951 Secondary malignant neoplasm of bone: Secondary | ICD-10-CM

## 2021-12-16 DIAGNOSIS — C50919 Malignant neoplasm of unspecified site of unspecified female breast: Secondary | ICD-10-CM

## 2021-12-16 DIAGNOSIS — Z95828 Presence of other vascular implants and grafts: Secondary | ICD-10-CM

## 2021-12-16 LAB — CMP (CANCER CENTER ONLY)
ALT: 54 U/L — ABNORMAL HIGH (ref 0–44)
AST: 34 U/L (ref 15–41)
Albumin: 4.1 g/dL (ref 3.5–5.0)
Alkaline Phosphatase: 61 U/L (ref 38–126)
Anion gap: 7 (ref 5–15)
BUN: 10 mg/dL (ref 6–20)
CO2: 26 mmol/L (ref 22–32)
Calcium: 9.1 mg/dL (ref 8.9–10.3)
Chloride: 106 mmol/L (ref 98–111)
Creatinine: 0.68 mg/dL (ref 0.44–1.00)
GFR, Estimated: >60 mL/min (ref >60)
Glucose, Bld: 134 mg/dL — ABNORMAL HIGH (ref 70–99)
Potassium: 3.7 mmol/L (ref 3.5–5.1)
Sodium: 139 mmol/L (ref 135–145)
Total Bilirubin: 0.2 mg/dL — ABNORMAL LOW (ref 0.3–1.2)
Total Protein: 7.2 g/dL (ref 6.5–8.1)

## 2021-12-16 LAB — CBC WITH DIFFERENTIAL (CANCER CENTER ONLY)
Abs Immature Granulocytes: 0.09 10*3/uL — ABNORMAL HIGH (ref 0.00–0.07)
Basophils Absolute: 0 10*3/uL (ref 0.0–0.1)
Basophils Relative: 0 %
Eosinophils Absolute: 0.1 10*3/uL (ref 0.0–0.5)
Eosinophils Relative: 3 %
HCT: 31.3 % — ABNORMAL LOW (ref 36.0–46.0)
Hemoglobin: 10.7 g/dL — ABNORMAL LOW (ref 12.0–15.0)
Immature Granulocytes: 3 %
Lymphocytes Relative: 52 %
Lymphs Abs: 1.4 10*3/uL (ref 0.7–4.0)
MCH: 30.3 pg (ref 26.0–34.0)
MCHC: 34.2 g/dL (ref 30.0–36.0)
MCV: 88.7 fL (ref 80.0–100.0)
Monocytes Absolute: 0.5 10*3/uL (ref 0.1–1.0)
Monocytes Relative: 18 %
Neutro Abs: 0.7 10*3/uL — ABNORMAL LOW (ref 1.7–7.7)
Neutrophils Relative %: 24 %
Platelet Count: 343 10*3/uL (ref 150–400)
RBC: 3.53 MIL/uL — ABNORMAL LOW (ref 3.87–5.11)
RDW: 15 % (ref 11.5–15.5)
Smear Review: NORMAL
WBC Count: 2.8 10*3/uL — ABNORMAL LOW (ref 4.0–10.5)
nRBC: 0.7 % — ABNORMAL HIGH (ref 0.0–0.2)

## 2021-12-16 MED ORDER — SODIUM CHLORIDE 0.9% FLUSH
10.0000 mL | Freq: Once | INTRAVENOUS | Status: AC
  Administered 2021-12-16: 10 mL

## 2021-12-16 MED ORDER — HEPARIN SOD (PORK) LOCK FLUSH 100 UNIT/ML IV SOLN
500.0000 [IU] | Freq: Once | INTRAVENOUS | Status: AC
  Administered 2021-12-16: 500 [IU]

## 2021-12-16 MED ORDER — FILGRASTIM-SNDZ 300 MCG/0.5ML IJ SOSY
300.0000 ug | PREFILLED_SYRINGE | Freq: Once | INTRAMUSCULAR | Status: AC
  Administered 2021-12-16: 300 ug via SUBCUTANEOUS
  Filled 2021-12-16: qty 0.5

## 2021-12-16 MED ORDER — FILGRASTIM-AYOW 300 MCG/0.5ML ~~LOC~~ SOSY: 300.0000 ug | PREFILLED_SYRINGE | Freq: Once | SUBCUTANEOUS | Status: DC

## 2021-12-16 NOTE — Patient Instructions (Signed)
*Take claritin for the next 5-7 days following injection, plus tylenol as needed for possible joint pain*  Filgrastim Injection What is this medication? FILGRASTIM (fil GRA stim) lowers the risk of infection in people who are receiving chemotherapy. It works by Building control surveyor make more white blood cells, which protects your body from infection. It may also be used to help people who have been exposed to high doses of radiation. It can be used to help prepare your body before a stem cell transplant. It works by helping your bone marrow make and release stem cells into the blood. This medicine may be used for other purposes; ask your health care provider or pharmacist if you have questions. COMMON BRAND NAME(S): Neupogen, Nivestym, Releuko, Zarxio What should I tell my care team before I take this medication? They need to know if you have any of these conditions: History of blood diseases, such as sickle cell anemia Kidney disease Recent or ongoing radiation An unusual or allergic reaction to filgrastim, pegfilgrastim, latex, rubber, other medications, foods, dyes, or preservatives Pregnant or trying to get pregnant Breast-feeding How should I use this medication? This medication is injected under the skin or into a vein. It is usually given by your care team in a hospital or clinic setting. It may be given at home. If you get this medication at home, you will be taught how to prepare and give it. Use exactly as directed. Take it as directed on the prescription label at the same time every day. Keep taking it unless your care team tells you to stop. It is important that you put your used needles and syringes in a special sharps container. Do not put them in a trash can. If you do not have a sharps container, call your pharmacist or care team to get one. This medication comes with INSTRUCTIONS FOR USE. Ask your pharmacist for directions on how to use this medication. Read the information carefully.  Talk to your pharmacist or care team if you have questions. Talk to your care team about the use of this medication in children. While it may be prescribed for children for selected conditions, precautions do apply. Overdosage: If you think you have taken too much of this medicine contact a poison control center or emergency room at once. NOTE: This medicine is only for you. Do not share this medicine with others. What if I miss a dose? It is important not to miss any doses. Talk to your care team about what to do if you miss a dose. What may interact with this medication? Medications that may cause a release of neutrophils, such as lithium This list may not describe all possible interactions. Give your health care provider a list of all the medicines, herbs, non-prescription drugs, or dietary supplements you use. Also tell them if you smoke, drink alcohol, or use illegal drugs. Some items may interact with your medicine. What should I watch for while using this medication? Your condition will be monitored carefully while you are receiving this medication. You may need bloodwork while taking this medication. Talk to your care team about your risk of cancer. You may be more at risk for certain types of cancer if you take this medication. What side effects may I notice from receiving this medication? Side effects that you should report to your care team as soon as possible: Allergic reactions--skin rash, itching, hives, swelling of the face, lips, tongue, or throat Capillary leak syndrome--stomach or muscle pain, unusual weakness  or fatigue, feeling faint or lightheaded, decrease in the amount of urine, swelling of the ankles, hands, or feet, trouble breathing High white blood cell level--fever, fatigue, trouble breathing, night sweats, change in vision, weight loss Inflammation of the aorta--fever, fatigue, back, chest, or stomach pain, severe headache Kidney injury (glomerulonephritis)--decrease  in the amount of urine, red or dark brown urine, foamy or bubbly urine, swelling of the ankles, hands, or feet Shortness of breath or trouble breathing Spleen injury--pain in upper left stomach or shoulder Unusual bruising or bleeding Side effects that usually do not require medical attention (report to your care team if they continue or are bothersome): Back pain Bone pain Fatigue Fever Headache Nausea This list may not describe all possible side effects. Call your doctor for medical advice about side effects. You may report side effects to FDA at 1-800-FDA-1088. Where should I keep my medication? Keep out of the reach of children and pets. Keep this medication in the original packaging until you are ready to take it. Protect from light. See product for storage information. Each product may have different instructions. Get rid of any unused medication after the expiration date. To get rid of medications that are no longer needed or have expired: Take the medication to a medications take-back program. Check with your pharmacy or law enforcement to find a location. If you cannot return the medication, ask your pharmacist or care team how to get rid of this medication safely. NOTE: This sheet is a summary. It may not cover all possible information. If you have questions about this medicine, talk to your doctor, pharmacist, or health care provider.  2023 Elsevier/Gold Standard (2021-05-14 00:00:00)

## 2021-12-16 NOTE — Assessment & Plan Note (Signed)
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, ER 30%, PR 20%, HER2 equivocal by IHC, FISH positiveratio 2.8, Ki-67 25%, lymph node positive  Palliative radiation completed 09/03/2020 Taxotere/Herceptin/Perjeta 08/31/2020-12/18/2020 Herceptin Perjeta Maintenance beginning 01/08/2021-08/27/2021 Enhertu: 09/17/2021-10/29/2021: Discontinued for progression   PET/CT 07/07/2021: Interval decrease in the left breast mass having an SUV of 2.7, small nodule left breast SUV 5.6, left axillary lymph node SUV 3.35, liver metastases no longer visible. Sclerosis involving bone metastases. New compression fracture L3 vertebral spine no sclerotic  [facility] breast [facility] on 08/26/2021:IDC with lobular features ER 90%, PR 0%, HER2 1+ (low) ------------------------------------------------------------------------------------------------------------------------------------ Current treatment: Carboplatin and gemcitabine, today cycle 2-day 1 Toxicities:  1.  Pancytopenia: We had to hold treatment last week because of cytopenias. 2.  Palpitations: Related to sudden drop in the hemoglobin.  There is no need for blood transfusions at this level.  For the next cycle of chemotherapy if her counts recover we will be able to resume it with reduced dosage and administration of G-CSF after the day 8 of chemo.  Return to clinic in 1 week

## 2021-12-16 NOTE — Progress Notes (Signed)
Spoke w/ Vickey Huger w/ [employer] and she will start the zarxio assistance program for [employer].   Larene Beach, PharmD

## 2021-12-17 ENCOUNTER — Other Ambulatory Visit: Payer: Self-pay | Admitting: Hematology and Oncology

## 2021-12-17 ENCOUNTER — Other Ambulatory Visit: Payer: Self-pay

## 2021-12-17 ENCOUNTER — Ambulatory Visit: Payer: Self-pay | Admitting: Adult Health

## 2021-12-17 ENCOUNTER — Ambulatory Visit: Payer: Self-pay

## 2021-12-18 ENCOUNTER — Ambulatory Visit: Payer: Medicaid Other

## 2021-12-18 ENCOUNTER — Other Ambulatory Visit: Payer: Medicaid Other

## 2021-12-18 MED FILL — Dexamethasone Sodium Phosphate Inj 100 MG/10ML: INTRAMUSCULAR | Qty: 1 | Status: AC

## 2021-12-19 ENCOUNTER — Inpatient Hospital Stay (HOSPITAL_BASED_OUTPATIENT_CLINIC_OR_DEPARTMENT_OTHER): Payer: Self-pay | Admitting: Hematology and Oncology

## 2021-12-19 ENCOUNTER — Inpatient Hospital Stay: Payer: Medicaid Other | Attending: Adult Health

## 2021-12-19 ENCOUNTER — Inpatient Hospital Stay: Payer: Self-pay

## 2021-12-19 VITALS — BP 123/83 | HR 111 | Temp 97.3°F | Resp 18 | Ht 65.0 in | Wt 144.7 lb

## 2021-12-19 VITALS — BP 112/67 | HR 107 | Temp 98.5°F | Resp 18

## 2021-12-19 DIAGNOSIS — C7951 Secondary malignant neoplasm of bone: Secondary | ICD-10-CM

## 2021-12-19 DIAGNOSIS — C50919 Malignant neoplasm of unspecified site of unspecified female breast: Secondary | ICD-10-CM

## 2021-12-19 DIAGNOSIS — Z5189 Encounter for other specified aftercare: Secondary | ICD-10-CM | POA: Diagnosis not present

## 2021-12-19 DIAGNOSIS — C787 Secondary malignant neoplasm of liver and intrahepatic bile duct: Secondary | ICD-10-CM | POA: Insufficient documentation

## 2021-12-19 DIAGNOSIS — Z5111 Encounter for antineoplastic chemotherapy: Secondary | ICD-10-CM | POA: Insufficient documentation

## 2021-12-19 DIAGNOSIS — C773 Secondary and unspecified malignant neoplasm of axilla and upper limb lymph nodes: Secondary | ICD-10-CM | POA: Insufficient documentation

## 2021-12-19 DIAGNOSIS — C50912 Malignant neoplasm of unspecified site of left female breast: Secondary | ICD-10-CM

## 2021-12-19 DIAGNOSIS — D72819 Decreased white blood cell count, unspecified: Secondary | ICD-10-CM | POA: Diagnosis not present

## 2021-12-19 DIAGNOSIS — Z800 Family history of malignant neoplasm of digestive organs: Secondary | ICD-10-CM | POA: Diagnosis not present

## 2021-12-19 DIAGNOSIS — Z923 Personal history of irradiation: Secondary | ICD-10-CM | POA: Diagnosis not present

## 2021-12-19 DIAGNOSIS — Z9221 Personal history of antineoplastic chemotherapy: Secondary | ICD-10-CM | POA: Insufficient documentation

## 2021-12-19 DIAGNOSIS — F17290 Nicotine dependence, other tobacco product, uncomplicated: Secondary | ICD-10-CM | POA: Diagnosis not present

## 2021-12-19 DIAGNOSIS — Z95828 Presence of other vascular implants and grafts: Secondary | ICD-10-CM

## 2021-12-19 DIAGNOSIS — R911 Solitary pulmonary nodule: Secondary | ICD-10-CM | POA: Insufficient documentation

## 2021-12-19 DIAGNOSIS — Z170 Estrogen receptor positive status [ER+]: Secondary | ICD-10-CM | POA: Insufficient documentation

## 2021-12-19 DIAGNOSIS — Z79899 Other long term (current) drug therapy: Secondary | ICD-10-CM | POA: Insufficient documentation

## 2021-12-19 LAB — CBC WITH DIFFERENTIAL (CANCER CENTER ONLY)
Abs Immature Granulocytes: 0.78 10*3/uL — ABNORMAL HIGH (ref 0.00–0.07)
Basophils Absolute: 0.1 10*3/uL (ref 0.0–0.1)
Basophils Relative: 1 %
Eosinophils Absolute: 0.1 10*3/uL (ref 0.0–0.5)
Eosinophils Relative: 1 %
HCT: 32.2 % — ABNORMAL LOW (ref 36.0–46.0)
Hemoglobin: 11 g/dL — ABNORMAL LOW (ref 12.0–15.0)
Immature Granulocytes: 6 %
Lymphocytes Relative: 19 %
Lymphs Abs: 2.4 10*3/uL (ref 0.7–4.0)
MCH: 30.9 pg (ref 26.0–34.0)
MCHC: 34.2 g/dL (ref 30.0–36.0)
MCV: 90.4 fL (ref 80.0–100.0)
Monocytes Absolute: 1.7 10*3/uL — ABNORMAL HIGH (ref 0.1–1.0)
Monocytes Relative: 13 %
Neutro Abs: 8 10*3/uL — ABNORMAL HIGH (ref 1.7–7.7)
Neutrophils Relative %: 60 %
Platelet Count: 393 10*3/uL (ref 150–400)
RBC: 3.56 MIL/uL — ABNORMAL LOW (ref 3.87–5.11)
RDW: 15.7 % — ABNORMAL HIGH (ref 11.5–15.5)
WBC Count: 13.1 10*3/uL — ABNORMAL HIGH (ref 4.0–10.5)
nRBC: 0.4 % — ABNORMAL HIGH (ref 0.0–0.2)

## 2021-12-19 LAB — CMP (CANCER CENTER ONLY)
ALT: 34 U/L (ref 0–44)
AST: 30 U/L (ref 15–41)
Albumin: 4.2 g/dL (ref 3.5–5.0)
Alkaline Phosphatase: 70 U/L (ref 38–126)
Anion gap: 7 (ref 5–15)
BUN: 10 mg/dL (ref 6–20)
CO2: 27 mmol/L (ref 22–32)
Calcium: 9.8 mg/dL (ref 8.9–10.3)
Chloride: 105 mmol/L (ref 98–111)
Creatinine: 0.74 mg/dL (ref 0.44–1.00)
GFR, Estimated: >60 mL/min (ref >60)
Glucose, Bld: 107 mg/dL — ABNORMAL HIGH (ref 70–99)
Potassium: 4.4 mmol/L (ref 3.5–5.1)
Sodium: 139 mmol/L (ref 135–145)
Total Bilirubin: 0.2 mg/dL — ABNORMAL LOW (ref 0.3–1.2)
Total Protein: 7.3 g/dL (ref 6.5–8.1)

## 2021-12-19 MED ORDER — SODIUM CHLORIDE 0.9% FLUSH
10.0000 mL | Freq: Once | INTRAVENOUS | Status: AC
  Administered 2021-12-19: 10 mL

## 2021-12-19 MED ORDER — SODIUM CHLORIDE 0.9 % IV SOLN
800.0000 mg/m2 | Freq: Once | INTRAVENOUS | Status: AC
  Administered 2021-12-19: 1368 mg via INTRAVENOUS
  Filled 2021-12-19: qty 35.98

## 2021-12-19 MED ORDER — SODIUM CHLORIDE 0.9 % IV SOLN
10.0000 mg | Freq: Once | INTRAVENOUS | Status: AC
  Administered 2021-12-19: 10 mg via INTRAVENOUS
  Filled 2021-12-19 (×2): qty 1
  Filled 2021-12-19: qty 10

## 2021-12-19 MED ORDER — SODIUM CHLORIDE 0.9 % IV SOLN: Freq: Once | INTRAVENOUS | Status: AC

## 2021-12-19 MED ORDER — SODIUM CHLORIDE 0.9 % IV SOLN
199.2000 mg | Freq: Once | INTRAVENOUS | Status: AC
  Administered 2021-12-19: 200 mg via INTRAVENOUS
  Filled 2021-12-19: qty 20

## 2021-12-19 MED ORDER — SODIUM CHLORIDE 0.9% FLUSH
10.0000 mL | INTRAVENOUS | Status: DC | PRN
  Administered 2021-12-19: 10 mL

## 2021-12-19 MED ORDER — HEPARIN SOD (PORK) LOCK FLUSH 100 UNIT/ML IV SOLN
500.0000 [IU] | Freq: Once | INTRAVENOUS | Status: AC | PRN
  Administered 2021-12-19: 500 [IU]

## 2021-12-19 MED ORDER — PALONOSETRON HCL INJECTION 0.25 MG/5ML
0.2500 mg | Freq: Once | INTRAVENOUS | Status: AC
  Administered 2021-12-19: 0.25 mg via INTRAVENOUS
  Filled 2021-12-19: qty 5

## 2021-12-19 NOTE — Patient Instructions (Signed)
[facility]COL[facility]  Discharge Instructions: Thank you for choosing [facility] to provide your oncology and hematology care.   If you have a lab appointment with the [facility], please go directly to the [facility] and check in at the registration area.   Wear comfortable clothing and clothing appropriate for easy access to any Portacath or PICC line.   We strive to give you quality time with your provider. You may need to reschedule your appointment if you arrive late (15 or more minutes).  Arriving late affects you and other patients whose appointments are after yours.  Also, if you miss three or more appointments without notifying the office, you may be dismissed from the clinic at the provider's discretion.      For prescription refill requests, have your pharmacy contact our office and allow 72 hours for refills to be completed.    Today you received the following chemotherapy and/or immunotherapy agents: Gemcitazine (Gemzar) and Carboplatin.   To help prevent nausea and vomiting after your treatment, we encourage you to take your nausea medication as directed.  BELOW ARE SYMPTOMS THAT SHOULD BE REPORTED IMMEDIATELY: *FEVER GREATER THAN 100.4 F (38 C) OR HIGHER *CHILLS OR SWEATING *NAUSEA AND VOMITING THAT IS NOT CONTROLLED WITH YOUR NAUSEA MEDICATION *UNUSUAL SHORTNESS OF BREATH *UNUSUAL BRUISING OR BLEEDING *URINARY PROBLEMS (pain or burning when urinating, or frequent urination) *BOWEL PROBLEMS (unusual diarrhea, constipation, pain near the anus) TENDERNESS IN MOUTH AND THROAT WITH OR WITHOUT PRESENCE OF ULCERS (sore throat, sores in mouth, or a toothache) UNUSUAL RASH, SWELLING OR PAIN  UNUSUAL VAGINAL DISCHARGE OR ITCHING   Items with * indicate a potential emergency and should be followed up as soon as possible or go to the Emergency Department if any problems should occur.  Please show the CHEMOTHERAPY ALERT CARD or IMMUNOTHERAPY  ALERT CARD at check-in to the Emergency Department and triage nurse.  Should you have questions after your visit or need to cancel or reschedule your appointment, please contact [facility]  Dept: [phone]  and follow the prompts.  Office hours are 8:00 a.m. to 4:30 p.m. Monday - Friday. Please note that voicemails left after 4:00 p.m. may not be returned until the following business day.  We are closed weekends and major holidays. You have access to a nurse at all times for urgent questions. Please call the main number to the clinic Dept: [phone] and follow the prompts.   For any non-urgent questions, you may also contact your provider using MyChart. We now offer e-Visits for anyone 45 and older to request care online for non-urgent symptoms. For details visit [email or web address].   Also download the MyChart app! Go to the app store, search "MyChart", open the app, select [facility], and log in with your MyChart username and password.  Masks are optional in the cancer centers. If you would like for your care team to wear a mask while they are taking care of you, please let them know. You may have one support person who is at least 27 years old accompany you for your appointments.

## 2021-12-19 NOTE — Assessment & Plan Note (Signed)
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, FISH positive ratio 2.8, Ki-67 25%, lymph node positive   Palliative radiation completed 09/03/2020 Taxotere/Herceptin/Perjeta 08/31/2020-12/18/2020 Herceptin Perjeta Maintenance  beginning 01/08/2021-08/27/2021 Enhertu: 09/17/2021-10/29/2021: Discontinued for progression     PET/CT 07/07/2021: Interval decrease in the left breast mass having an SUV of 2.7, small nodule left breast SUV 5.6, left axillary lymph node SUV 3.35, liver metastases no longer visible.  Sclerosis involving bone metastases.  New compression fracture L3 vertebral spine no sclerotic   Biopsy of the breast at [facility] on 08/26/2021: IDC with lobular features ER 90%, PR 0%, HER2 1+ (low) ------------------------------------------------------------------------------------------------------------------------------------ Current treatment: Carboplatin and gemcitabine, today cycle 2-day 1 (being delayed because of leukopenia and neutropenia) Toxicities:  1.  Pancytopenia: We had to hold treatment last week because of cytopenias.   2.  Palpitations: Related to sudden drop in the hemoglobin.  There is no need for blood transfusions at this level.     Return to clinic in 1 week for cycle 2-day 8 of chemo

## 2021-12-19 NOTE — Progress Notes (Signed)
Patient Care Team: Pcp, No as PCP - General [name], MD as Consulting Physician (Hematology and Oncology) [name], [name], NP as Nurse Practitioner (Hematology and Oncology) [name], MD as Consulting Physician (General Surgery)  DIAGNOSIS:  Encounter Diagnoses  Name Primary?   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  Prominent left axillary lymph node cluster of adjacent lymph nodes, irregular nodule right middle lobe 1.1 cm, aggressive lesion right second rib cortical destruction, multiple lucent lesions throughout the thoracic spine including T1 vertebral body and T2, irregular multilobar lesion central left hepatic lobe 4.3 x 4.7 cm.  Several satellite lesions in the left lateral hepatic lobe.  Multiple sclerotic lesions in the bones iliac wings, acetabular, medial iliac bones, sacrum multiple lesions lumbar spine L3-L4 and L5   08/17/2020 Initial Diagnosis   Biopsy revealed IDC with DCIS grade 2, ER 30%, PR 20%, HER2 equivocal by IHC, FISH positive, Ki-67 25%, lymph node positive    Genetic Testing   Negative genetic testing:  No pathogenic variants detected on the [employer] CancerNext-Expanded + RNAinsight panel. The report date is 09/06/2020.   The CancerNext-Expanded + RNAinsight gene panel offered by [employer] and includes sequencing and rearrangement analysis for the following 77 genes: AIP, ALK, APC, ATM, AXIN2, BAP1, BARD1, BLM, BMPR1A, BRCA1, BRCA2, BRIP1, CDC73, CDH1, CDK4, CDKN1B, CDKN2A, CHEK2, CTNNA1, DICER1, FANCC, FH, FLCN, GALNT12, KIF1B, LZTR1, MAX, MEN1, MET, MLH1, MSH2, MSH3, MSH6, MUTYH, NBN, NF1, NF2, NTHL1, PALB2, PHOX2B, PMS2, POT1, PRKAR1A, PTCH1, PTEN, RAD51C, RAD51D, RB1, RECQL,  RET, SDHA, SDHAF2, SDHB, SDHC, SDHD, SMAD4, SMARCA4, SMARCB1, SMARCE1, STK11, SUFU, TMEM127, TP53, TSC1, TSC2, VHL and XRCC2 (sequencing and deletion/duplication); EGFR, EGLN1, HOXB13, KIT, MITF, PDGFRA, POLD1 and POLE (sequencing only); EPCAM and GREM1 (deletion/duplication only). RNA data is routinely analyzed for use in variant interpretation for all genes.   09/17/2021 - 10/07/2021 Chemotherapy   Patient is on Treatment Plan : BREAST METASTATIC fam-trastuzumab deruxtecan-nxki (Enhertu) q21d     09/17/2021 - 10/29/2021 Chemotherapy   Patient is on Treatment Plan : BREAST METASTATIC Fam-Trastuzumab Deruxtecan-nxki (Enhertu) (5.4) q21d     11/19/2021 -  Chemotherapy   Patient is on Treatment Plan : BREAST Carboplatin + Gemcitabine D1,8 q21d     Carcinoma of breast metastatic to bone ([place])  08/17/2020 Initial Diagnosis   Carcinoma of breast metastatic to bone ([place]); Goserelin/Zoladex started and to be given every 4 weeks    Genetic Testing   Negative genetic testing:  No pathogenic variants detected on the [employer] CancerNext-Expanded + RNAinsight panel. The report date is 09/06/2020.   The CancerNext-Expanded + RNAinsight gene panel offered by [employer] and includes sequencing and rearrangement analysis for the following 77 genes: AIP, ALK, APC, ATM, AXIN2, BAP1, BARD1, BLM, BMPR1A, BRCA1, BRCA2, BRIP1, CDC73, CDH1, CDK4, CDKN1B, CDKN2A, CHEK2, CTNNA1, DICER1, FANCC, FH, FLCN, GALNT12, KIF1B, LZTR1, MAX, MEN1, MET, MLH1, MSH2, MSH3, MSH6, MUTYH, NBN, NF1, NF2, NTHL1, PALB2, PHOX2B, PMS2, POT1, PRKAR1A, PTCH1, PTEN, RAD51C, RAD51D, RB1, RECQL, RET, SDHA, SDHAF2, SDHB, SDHC, SDHD, SMAD4, SMARCA4, SMARCB1, SMARCE1, STK11, SUFU, TMEM127, TP53, TSC1, TSC2, VHL and XRCC2 (sequencing and deletion/duplication); EGFR, EGLN1, HOXB13, KIT, MITF, PDGFRA, POLD1 and POLE (sequencing only); EPCAM and GREM1 (deletion/duplication only). RNA data is routinely analyzed  for use in variant interpretation for all genes.    08/18/2020 - 08/30/2020 Radiation Therapy   Palliative radiation to the lumbar and pelvic bone; 30 Gy in 10 fractions   08/31/2020 - 12/18/2020 Adjuvant Chemotherapy   Started with Herceptin/Perjeta on 08/31/2020 and Taxotere on 09/04/2020; she was hospitalized with dehydration and refractory diarrhea for 11 days, she resumed treatment with Taxotere (dose reduced by 47m/m2) and Herceptin only (perjeta discontinued) on 09/26/2020, reintroduced 10/17/2020   09/03/2020 Cancer Staging   Staging form: Breast, AJCC 8th Edition - Clinical stage from 09/03/2020: Stage IV (cT4b, cN1, cM1, G2, ER+, PR+, HER2+) - Signed by CGardenia Phlegm NP on 10/03/2020 Stage prefix: Initial diagnosis Method of lymph node assessment: Clinical Histologic grading system: 3 grade system   01/08/2021 - 08/27/2021 Adjuvant Chemotherapy   Herceptin/Perjeta every 3 weeks, Zometa every 12 weeks added 01/29/2021   07/07/2021 PET scan   1. Interval decrease in size of the dominant left breast mass which now exhibits mild low level FDG uptake with SUV max of 2.7. Small nodule within the left breast exhibits moderate tracer uptake within SUV max of 5.6 and is suspicious for residual/recurrent local tumor. 2. Mild tracer uptake associated with prominent left axillary lymph node with SUV max of 3.35. Residual tracer avid nodal metastasis not excluded. 3. The previously noted liver metastasis are no longer visible on the CT images from today's study. There is no abnormal increased uptake within the left hepatic lobe above background liver activity to suggest metabolically active tumor metastasis. 4. Interval increase in sclerosis associated with previously noted lytic bone metastasis without significant increased uptake above background bone marrow activity compatible with healing multifocal bone metastasis. 5. New pathologic fracture involves the L3 vertebral body where there was extensive lytic changes noted on the  previous CT. The collapsed vertebral body is now sclerotic with mild low level FDG uptake. 6. Similar appearance of soft tissue infiltration within the anterior mediastinal fat which exhibits mild FDG uptake on today's study. 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   07/16/2021 Surgery   S/p BSO, benign pathology    08/26/2021 Mammogram   Mammo diagnostic breast tomosynthesis left: There is an additional  irregular mass in the lower inner position at posterior depth. This was  further evaluated with ultrasound.   Mammo UKoreabreast limited left: There is a 0.7 cm x 0.5 cm x 0.7 cm  irregularly shaped, hypoechoic mass with indistinct margins seen in the  left breast at 8 o'clock, 8 cm from the nipple. This mass is 1.6 cm medial  to the palpable mass as detailed above.    08/26/2021 Pathology Results   New left breast mass 8:00 biopsy: Invasive carcinoma with mixed ductal and lobular and focal micropapillary features.  Grade 3, invasive carcinoma measures 9 mm with an associated lymphoid reaction.  ER negative PR negative HER2 1+.  Left breast mass 2:00 biopsy: Invasive carcinoma with mixed ductal and lobular features Nottingham grade 2 measuring 1.4 cm.  ER 9% (low positive), PR negative, HER2 1+.   09/16/2021 PET scan   1. Interval progression of hypermetabolic metastatic disease. 2. Hypermetabolic bilateral axillary lymphadenopathy, subcarinal and bilateral hilar lymphadenopathy, new/progressive. 3. New hypermetabolic nodularity in the lower left breast is compatible with progressive malignancy. 4. New hypermetabolic mixed lytic and sclerotic left pelvic bone metastases as detailed. 5. Persistent patchy hypermetabolism in the anterior mediastinum associated with mixed soft tissue and fat density, favor thymic hyperplasia.   09/17/2021 - 10/07/2021 Chemotherapy  Patient is on Treatment Plan : BREAST METASTATIC fam-trastuzumab deruxtecan-nxki (Enhertu) q21d      09/17/2021 - 10/29/2021 Chemotherapy   Patient is on Treatment Plan : BREAST METASTATIC Fam-Trastuzumab Deruxtecan-nxki (Enhertu) (5.4) q21d     11/11/2021 PET scan    1. Hypermetabolic left breast soft tissue and skin thickening is increased  from prior examination and suspicious for worsening primary malignancy.   2. There is evidence of interval worsening hypermetabolic adenopathy.  Primary differential consideration is worsening nodal metastasis, however  given the distribution the possibility of sarcoid-like reaction in the  setting of therapy is raised. There is worsening hypermetabolic left  axillary adenopathy with new areas of hypermetabolic adenopathy seen in the  left subpectoral, internal mammary, cardiophrenic, right axillary,  mediastinal, and bilateral hilar nodal stations. A few nonspecific FDG avid  nonenlarged left pelvic/inguinal nodes are also seen. Tissue sampling  should be considered for definitive assessment, otherwise close follow-up  is recommended.   3. Interval increased hypermetabolic activity associated with osseous  metastasis in the left hemipelvis. Other nonavid sclerotic bone lesions may  reflect sites of treated disease.   Electronically Reviewed by:  Mahala Menghini, MD, [facility]  Electronically Reviewed on:  11/11/2021 6:13 PM    11/19/2021 -  Chemotherapy   Patient is on Treatment Plan : BREAST Carboplatin + Gemcitabine D1,8 q21d       CHIEF COMPLIANT: Cycle 2 carboplatin and gemcitabine  INTERVAL HISTORY: Ellen [name] is a 61-year with above-mentioned history of metastatic breast cancer was currently on better to chemotherapy with carboplatin and gemcitabine.  Today is cycle 2 of her treatment.  Her chemo was delayed because of cytopenias.  We had to give her Neupogen injection which brought the numbers up and she is able to receive it today.  She is otherwise feeling quite well.  She does not have any residual side effects from the prior  treatment.   ALLERGIES:  is allergic to other.  MEDICATIONS:  Current Outpatient Medications  Medication Sig Dispense Refill   acyclovir (ZOVIRAX) 800 MG tablet Take 1 tablet (800 mg total) by mouth 2 (two) times daily as needed (flare up). 60 tablet 5   acyclovir ointment (ZOVIRAX) 5 % Apply 1 application. topically 4 (four) times daily as needed (outbreak).     albuterol (VENTOLIN HFA) 108 (90 Base) MCG/ACT inhaler Inhale 1 puff every 4 hours by inhalation route.     Calcium Carb-Cholecalciferol 500-10 MG-MCG TABS Take by mouth.     Cholecalciferol 50 MCG (2000 UT) CAPS Take by mouth.     lidocaine-prilocaine (EMLA) cream      lisdexamfetamine (VYVANSE) 40 MG capsule Take 1 capsule by mouth daily.     LORazepam (ATIVAN) 0.5 MG tablet Take 1 tablet (0.5 mg total) by mouth every 8 (eight) hours as needed for anxiety. 30 tablet 0   magnesium oxide (MAG-OX) 400 (240 Mg) MG tablet Take 1 tablet (400 mg total) by mouth daily. 30 tablet 3   ondansetron (ZOFRAN) 8 MG tablet Take 1 tablet (8 mg total) by mouth every 8 (eight) hours as needed for nausea or vomiting. 20 tablet 0   pantoprazole (PROTONIX) 40 MG tablet Take 1 tablet (40 mg total) by mouth daily. 30 tablet 3   prochlorperazine (COMPAZINE) 10 MG tablet Take 1 tablet (10 mg total) by mouth every 6 (six) hours as needed for nausea or vomiting. 30 tablet 0   venlafaxine XR (EFFEXOR-XR) 150 MG 24 hr capsule Take 1 capsule (150  mg total) by mouth daily with breakfast. 30 capsule 6   No current facility-administered medications for this visit.    PHYSICAL EXAMINATION: ECOG PERFORMANCE STATUS: 1 - Symptomatic but completely ambulatory  Vitals:   12/19/21 1236  BP: 123/83  Pulse: (!) 111  Resp: 18  Temp: (!) 97.3 F (36.3 C)  SpO2: 100%   Filed Weights   12/19/21 1236  Weight: 144 lb 11.2 oz (65.6 kg)      LABORATORY DATA:  I have reviewed the data as listed    Latest Ref Rng & Units 12/16/2021    9:33 AM 12/09/2021     7:54 AM 11/27/2021   10:10 AM  CMP  Glucose 70 - 99 mg/dL 134  131  123   BUN 6 - 20 mg/dL _0 Creatinine 0.44 - 1.00 mg/dL 0.68  0.68  0.67   Sodium 135 - 145 mmol/L 139  138  140   Potassium 3.5 - 5.1 mmol/L 3.7  3.7  4.2   Chloride 98 - 111 mmol/L 106  108  108   CO2 22 - 32 mmol/L _1 Calcium 8.9 - 10.3 mg/dL 9.1  9.2  9.0   Total Protein 6.5 - 8.1 g/dL 7.2  7.0  6.8   Total Bilirubin 0.3 - 1.2 mg/dL 0.2  0.3  0.2   Alkaline Phos 38 - 126 U/L 61  52  54   AST 15 - 41 U/L 34  78  25   ALT 0 - 44 U/L 54  134  21     Lab Results  Component Value Date   WBC 13.1 (H) 12/19/2021   HGB 11.0 (L) 12/19/2021   HCT 32.2 (L) 12/19/2021   MCV 90.4 12/19/2021   PLT 393 12/19/2021   NEUTROABS 8.0 (H) 12/19/2021    ASSESSMENT & PLAN:  Carcinoma of breast metastatic to bone ([place]) 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, FISH positive ratio 2.8, Ki-67 25%, lymph node positive   Palliative radiation completed 09/03/2020 Taxotere/Herceptin/Perjeta 08/31/2020-12/18/2020 Herceptin Perjeta Maintenance  beginning 01/08/2021-08/27/2021 Enhertu: 09/17/2021-10/29/2021: Discontinued for progression     PET/CT 07/07/2021: Interval decrease in the left breast mass having an SUV of 2.7, small nodule left breast SUV 5.6, left axillary lymph node SUV 3.35, liver metastases no longer visible.  Sclerosis involving bone metastases.  New compression fracture L3 vertebral spine no sclerotic   Biopsy of the breast at [facility] on 08/26/2021: IDC with lobular features ER 90%, PR 0%, HER2 1+ (low) ------------------------------------------------------------------------------------------------------------------------------------ Current treatment: Carboplatin and gemcitabine, today cycle 2-day 1 (being delayed because of leukopenia  and neutropenia) Toxicities:  1.  Pancytopenia: We had to hold treatment last week because of cytopenias.   2.  Palpitations: Much improved We will plan to give Neupogen injections prior to day 8 of each cycle. She will get Neulasta injection on day 9 or 10 of each cycle.  Return to clinic in 1 week for cycle 2-day 8 of chemo   No orders of the defined types were placed in this encounter.  The patient has a good understanding of the overall plan. she agrees with it. she will call with any problems that may develop before the next visit here. Total time spent: 30 mins including face to face  time and time spent for planning, charting and co-ordination of care   Harriette Ohara, MD 12/19/21

## 2021-12-25 ENCOUNTER — Telehealth: Payer: Self-pay | Admitting: Hematology and Oncology

## 2021-12-25 NOTE — Telephone Encounter (Signed)
Scheduled appointment per WQ. Left voicemail. 

## 2021-12-26 ENCOUNTER — Encounter: Payer: Self-pay | Admitting: Hematology and Oncology

## 2021-12-26 ENCOUNTER — Encounter: Payer: Self-pay | Admitting: General Practice

## 2021-12-26 NOTE — Progress Notes (Signed)
Called pt to touch base w/ her regarding the [employer] again.  She never provided the required docs to apply for the grant so I left a msg requesting she return my call if she's still interested in applying for the grant.

## 2021-12-26 NOTE — Progress Notes (Signed)
[place] Spiritual Care Note  Will miss Mkayla in infusion tomorrow, so left voicemail of care, encouragement, and ongoing availability with direct callback number.   [place], [place], [facility] Pager [phone] Voicemail [phone]

## 2021-12-27 ENCOUNTER — Inpatient Hospital Stay: Payer: Self-pay | Admitting: Licensed Clinical Social Worker

## 2021-12-27 ENCOUNTER — Inpatient Hospital Stay (HOSPITAL_BASED_OUTPATIENT_CLINIC_OR_DEPARTMENT_OTHER): Payer: Self-pay | Admitting: Adult Health

## 2021-12-27 ENCOUNTER — Encounter: Payer: Self-pay | Admitting: Adult Health

## 2021-12-27 ENCOUNTER — Encounter: Payer: Self-pay | Admitting: Hematology and Oncology

## 2021-12-27 ENCOUNTER — Inpatient Hospital Stay: Payer: Self-pay

## 2021-12-27 VITALS — BP 139/62 | HR 113 | Temp 98.2°F | Resp 18 | Ht 65.0 in | Wt 144.4 lb

## 2021-12-27 VITALS — BP 113/63 | HR 98 | Resp 16

## 2021-12-27 DIAGNOSIS — C50912 Malignant neoplasm of unspecified site of left female breast: Secondary | ICD-10-CM

## 2021-12-27 DIAGNOSIS — C50919 Malignant neoplasm of unspecified site of unspecified female breast: Secondary | ICD-10-CM

## 2021-12-27 DIAGNOSIS — T7411XA Adult physical abuse, confirmed, initial encounter: Secondary | ICD-10-CM

## 2021-12-27 DIAGNOSIS — Z5111 Encounter for antineoplastic chemotherapy: Secondary | ICD-10-CM | POA: Diagnosis not present

## 2021-12-27 DIAGNOSIS — C7951 Secondary malignant neoplasm of bone: Secondary | ICD-10-CM

## 2021-12-27 DIAGNOSIS — Z95828 Presence of other vascular implants and grafts: Secondary | ICD-10-CM

## 2021-12-27 LAB — CMP (CANCER CENTER ONLY)
ALT: 42 U/L (ref 0–44)
AST: 38 U/L (ref 15–41)
Albumin: 4 g/dL (ref 3.5–5.0)
Alkaline Phosphatase: 54 U/L (ref 38–126)
Anion gap: 5 (ref 5–15)
BUN: 11 mg/dL (ref 6–20)
CO2: 26 mmol/L (ref 22–32)
Calcium: 9 mg/dL (ref 8.9–10.3)
Chloride: 110 mmol/L (ref 98–111)
Creatinine: 0.62 mg/dL (ref 0.44–1.00)
GFR, Estimated: >60 mL/min (ref >60)
Glucose, Bld: 103 mg/dL — ABNORMAL HIGH (ref 70–99)
Potassium: 3.9 mmol/L (ref 3.5–5.1)
Sodium: 141 mmol/L (ref 135–145)
Total Bilirubin: 0.3 mg/dL (ref 0.3–1.2)
Total Protein: 7.1 g/dL (ref 6.5–8.1)

## 2021-12-27 LAB — CBC WITH DIFFERENTIAL (CANCER CENTER ONLY)
Abs Immature Granulocytes: 0.02 10*3/uL (ref 0.00–0.07)
Basophils Absolute: 0 10*3/uL (ref 0.0–0.1)
Basophils Relative: 0 %
Eosinophils Absolute: 0 10*3/uL (ref 0.0–0.5)
Eosinophils Relative: 1 %
HCT: 29 % — ABNORMAL LOW (ref 36.0–46.0)
Hemoglobin: 10 g/dL — ABNORMAL LOW (ref 12.0–15.0)
Immature Granulocytes: 1 %
Lymphocytes Relative: 37 %
Lymphs Abs: 1.4 10*3/uL (ref 0.7–4.0)
MCH: 30.9 pg (ref 26.0–34.0)
MCHC: 34.5 g/dL (ref 30.0–36.0)
MCV: 89.5 fL (ref 80.0–100.0)
Monocytes Absolute: 0.3 10*3/uL (ref 0.1–1.0)
Monocytes Relative: 7 %
Neutro Abs: 2.1 10*3/uL (ref 1.7–7.7)
Neutrophils Relative %: 54 %
Platelet Count: 158 10*3/uL (ref 150–400)
RBC: 3.24 MIL/uL — ABNORMAL LOW (ref 3.87–5.11)
RDW: 15.2 % (ref 11.5–15.5)
WBC Count: 3.9 10*3/uL — ABNORMAL LOW (ref 4.0–10.5)
nRBC: 0 % (ref 0.0–0.2)

## 2021-12-27 MED ORDER — SODIUM CHLORIDE 0.9 % IV SOLN
199.2000 mg | Freq: Once | INTRAVENOUS | Status: AC
  Administered 2021-12-27: 200 mg via INTRAVENOUS
  Filled 2021-12-27: qty 20

## 2021-12-27 MED ORDER — HEPARIN SOD (PORK) LOCK FLUSH 100 UNIT/ML IV SOLN
500.0000 [IU] | Freq: Once | INTRAVENOUS | Status: AC | PRN
  Administered 2021-12-27: 500 [IU]

## 2021-12-27 MED ORDER — PEGFILGRASTIM 6 MG/0.6ML ~~LOC~~ PSKT
6.0000 mg | PREFILLED_SYRINGE | Freq: Once | SUBCUTANEOUS | Status: DC
  Administered 2021-12-27: 6 mg via SUBCUTANEOUS
  Filled 2021-12-27: qty 0.6

## 2021-12-27 MED ORDER — SODIUM CHLORIDE 0.9% FLUSH
10.0000 mL | INTRAVENOUS | Status: DC | PRN
  Administered 2021-12-27: 10 mL

## 2021-12-27 MED ORDER — SODIUM CHLORIDE 0.9 % IV SOLN
10.0000 mg | Freq: Once | INTRAVENOUS | Status: AC
  Administered 2021-12-27: 10 mg via INTRAVENOUS
  Filled 2021-12-27: qty 10

## 2021-12-27 MED ORDER — SODIUM CHLORIDE 0.9 % IV SOLN
800.0000 mg/m2 | Freq: Once | INTRAVENOUS | Status: AC
  Administered 2021-12-27: 1368 mg via INTRAVENOUS
  Filled 2021-12-27: qty 35.98

## 2021-12-27 MED ORDER — PALONOSETRON HCL INJECTION 0.25 MG/5ML
0.2500 mg | Freq: Once | INTRAVENOUS | Status: AC
  Administered 2021-12-27: 0.25 mg via INTRAVENOUS
  Filled 2021-12-27: qty 5

## 2021-12-27 MED ORDER — SODIUM CHLORIDE 0.9% FLUSH
10.0000 mL | Freq: Once | INTRAVENOUS | Status: AC
  Administered 2021-12-27: 10 mL

## 2021-12-27 MED ORDER — SODIUM CHLORIDE 0.9 % IV SOLN: Freq: Once | INTRAVENOUS | Status: AC
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
## 2021-12-27 NOTE — Assessment & Plan Note (Signed)
Ellen [name] is a 26 year old woman with stage IV breast cancer here today for follow-up and evaluation prior to receiving cycle 2-day 8 of gemcitabine and carboplatin.  She is tolerating this treatment well and her labs are stable and she is ready to proceed with chemotherapy.  I have taken a picture of her breast since she does feel like it is worsening and will review with Dr. Lindi Adie to see if he wants to do any radiation to the area before resuming her gemcitabine carboplatin.

## 2021-12-27 NOTE — Patient Instructions (Signed)
[facility] ONCOLOGY  Discharge Instructions: Thank you for choosing [facility] to provide your oncology and hematology care.   If you have a lab appointment with the [facility], please go directly to the [facility] and check in at the registration area.   Wear comfortable clothing and clothing appropriate for easy access to any Portacath or PICC line.   We strive to give you quality time with your provider. You may need to reschedule your appointment if you arrive late (15 or more minutes).  Arriving late affects you and other patients whose appointments are after yours.  Also, if you miss three or more appointments without notifying the office, you may be dismissed from the clinic at the provider's discretion.      For prescription refill requests, have your pharmacy contact our office and allow 72 hours for refills to be completed.    Today you received the following chemotherapy and/or immunotherapy agents: Gemcitabine, Carboplatin.       To help prevent nausea and vomiting after your treatment, we encourage you to take your nausea medication as directed.  BELOW ARE SYMPTOMS THAT SHOULD BE REPORTED IMMEDIATELY: *FEVER GREATER THAN 100.4 F (38 C) OR HIGHER *CHILLS OR SWEATING *NAUSEA AND VOMITING THAT IS NOT CONTROLLED WITH YOUR NAUSEA MEDICATION *UNUSUAL SHORTNESS OF BREATH *UNUSUAL BRUISING OR BLEEDING *URINARY PROBLEMS (pain or burning when urinating, or frequent urination) *BOWEL PROBLEMS (unusual diarrhea, constipation, pain near the anus) TENDERNESS IN MOUTH AND THROAT WITH OR WITHOUT PRESENCE OF ULCERS (sore throat, sores in mouth, or a toothache) UNUSUAL RASH, SWELLING OR PAIN  UNUSUAL VAGINAL DISCHARGE OR ITCHING   Items with * indicate a potential emergency and should be followed up as soon as possible or go to the Emergency Department if any problems should occur.  Please show the CHEMOTHERAPY ALERT CARD or IMMUNOTHERAPY ALERT  CARD at check-in to the Emergency Department and triage nurse.  Should you have questions after your visit or need to cancel or reschedule your appointment, please contact [facility]  Dept: [phone]  and follow the prompts.  Office hours are 8:00 a.m. to 4:30 p.m. Monday - Friday. Please note that voicemails left after 4:00 p.m. may not be returned until the following business day.  We are closed weekends and major holidays. You have access to a nurse at all times for urgent questions. Please call the main number to the clinic Dept: [phone] and follow the prompts.   For any non-urgent questions, you may also contact your provider using MyChart. We now offer e-Visits for anyone 44 and older to request care online for non-urgent symptoms. For details visit [email or web address].   Also download the MyChart app! Go to the app store, search "MyChart", open the app, select [facility], and log in with your MyChart username and password.  Masks are optional in the cancer centers. If you would like for your care team to wear a mask while they are taking care of you, please let them know. You may have one support person who is at least 27 years old accompany you for your appointments.
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
## 2021-12-30 ENCOUNTER — Inpatient Hospital Stay: Payer: Self-pay

## 2021-12-30 ENCOUNTER — Inpatient Hospital Stay: Payer: Self-pay | Admitting: Hematology and Oncology

## 2022-01-04 ENCOUNTER — Encounter: Payer: Self-pay | Admitting: Hematology and Oncology

## 2022-01-06 MED FILL — Dexamethasone Sodium Phosphate Inj 100 MG/10ML: INTRAMUSCULAR | Qty: 1 | Status: AC

## 2022-01-06 NOTE — Progress Notes (Signed)
Patient Care Team: Pcp, No as PCP - General Ellen Lose, MD as Consulting Physician (Hematology and Oncology) [name], [name], NP as Nurse Practitioner (Hematology and Oncology) [name], MD as Consulting Physician (General Surgery)  DIAGNOSIS: No diagnosis found.  SUMMARY OF ONCOLOGIC HISTORY: Oncology History  Primary malignant neoplasm of breast with metastasis ([place])  08/15/2020 Imaging   Large mass in the medial left breast measuring 3.5 cm.  Prominent left axillary lymph node cluster of adjacent lymph nodes, irregular nodule right middle lobe 1.1 cm, aggressive lesion right second rib cortical destruction, multiple lucent lesions throughout the thoracic spine including T1 vertebral body and T2, irregular multilobar lesion central left hepatic lobe 4.3 x 4.7 cm.  Several satellite lesions in the left lateral hepatic lobe.  Multiple sclerotic lesions in the bones iliac wings, acetabular, medial iliac bones, sacrum multiple lesions lumbar spine L3-L4 and L5   08/17/2020 Initial Diagnosis   Biopsy revealed IDC with DCIS grade 2, ER 30%, PR 20%, HER2 equivocal by IHC, FISH positive, Ki-67 25%, lymph node positive    Genetic Testing   Negative genetic testing:  No pathogenic variants detected on the [employer] CancerNext-Expanded + RNAinsight panel. The report date is 09/06/2020.   The CancerNext-Expanded + RNAinsight gene panel offered by [employer] and includes sequencing and rearrangement analysis for the following 77 genes: AIP, ALK, APC, ATM, AXIN2, BAP1, BARD1, BLM, BMPR1A, BRCA1, BRCA2, BRIP1, CDC73, CDH1, CDK4, CDKN1B, CDKN2A, CHEK2, CTNNA1, DICER1, FANCC, FH, FLCN, GALNT12, KIF1B, LZTR1, MAX, MEN1, MET, MLH1, MSH2, MSH3, MSH6, MUTYH, NBN, NF1, NF2, NTHL1, PALB2, PHOX2B, PMS2, POT1, PRKAR1A, PTCH1, PTEN, RAD51C, RAD51D, RB1, RECQL, RET, SDHA, SDHAF2, SDHB, SDHC, SDHD, SMAD4, SMARCA4, SMARCB1, SMARCE1, STK11, SUFU, TMEM127, TP53, TSC1, TSC2, VHL and XRCC2 (sequencing and  deletion/duplication); EGFR, EGLN1, HOXB13, KIT, MITF, PDGFRA, POLD1 and POLE (sequencing only); EPCAM and GREM1 (deletion/duplication only). RNA data is routinely analyzed for use in variant interpretation for all genes.   09/17/2021 - 10/07/2021 Chemotherapy   Patient is on Treatment Plan : BREAST METASTATIC fam-trastuzumab deruxtecan-nxki (Enhertu) q21d     09/17/2021 - 10/29/2021 Chemotherapy   Patient is on Treatment Plan : BREAST METASTATIC Fam-Trastuzumab Deruxtecan-nxki (Enhertu) (5.4) q21d     11/19/2021 -  Chemotherapy   Patient is on Treatment Plan : BREAST Carboplatin + Gemcitabine D1,8 q21d     Carcinoma of breast metastatic to bone ([place])  08/17/2020 Initial Diagnosis   Carcinoma of breast metastatic to bone ([place]); Goserelin/Zoladex started and to be given every 4 weeks    Genetic Testing   Negative genetic testing:  No pathogenic variants detected on the [facility] CancerNext-Expanded + RNAinsight panel. The report date is 09/06/2020.   The CancerNext-Expanded + RNAinsight gene panel offered by [employer] and includes sequencing and rearrangement analysis for the following 77 genes: AIP, ALK, APC, ATM, AXIN2, BAP1, BARD1, BLM, BMPR1A, BRCA1, BRCA2, BRIP1, CDC73, CDH1, CDK4, CDKN1B, CDKN2A, CHEK2, CTNNA1, DICER1, FANCC, FH, FLCN, GALNT12, KIF1B, LZTR1, MAX, MEN1, MET, MLH1, MSH2, MSH3, MSH6, MUTYH, NBN, NF1, NF2, NTHL1, PALB2, PHOX2B, PMS2, POT1, PRKAR1A, PTCH1, PTEN, RAD51C, RAD51D, RB1, RECQL, RET, SDHA, SDHAF2, SDHB, SDHC, SDHD, SMAD4, SMARCA4, SMARCB1, SMARCE1, STK11, SUFU, TMEM127, TP53, TSC1, TSC2, VHL and XRCC2 (sequencing and deletion/duplication); EGFR, EGLN1, HOXB13, KIT, MITF, PDGFRA, POLD1 and POLE (sequencing only); EPCAM and GREM1 (deletion/duplication only). RNA data is routinely analyzed for use in variant interpretation for all genes.   08/18/2020 - 08/30/2020 Radiation Therapy   Palliative radiation to the lumbar and pelvic bone; 30 Gy  in 10 fractions   08/31/2020 - 12/18/2020  Adjuvant Chemotherapy   Started with Herceptin/Perjeta on 08/31/2020 and Taxotere on 09/04/2020; she was hospitalized with dehydration and refractory diarrhea for 11 days, she resumed treatment with Taxotere (dose reduced by 17m/m2) and Herceptin only (perjeta discontinued) on 09/26/2020, reintroduced 10/17/2020   09/03/2020 Cancer Staging   Staging form: Breast, AJCC 8th Edition - Clinical stage from 09/03/2020: Stage IV (cT4b, cN1, cM1, G2, ER+, PR+, HER2+) - Signed by CGardenia Phlegm NP on 10/03/2020 Stage prefix: Initial diagnosis Method of lymph node assessment: Clinical Histologic grading system: 3 grade system   01/08/2021 - 08/27/2021 Adjuvant Chemotherapy   Herceptin/Perjeta every 3 weeks, Zometa every 12 weeks added 01/29/2021   07/07/2021 PET scan   1. Interval decrease in size of the dominant left breast mass which now exhibits mild low level FDG uptake with SUV max of 2.7. Small nodule within the left breast exhibits moderate tracer uptake within SUV max of 5.6 and is suspicious for residual/recurrent local tumor. 2. Mild tracer uptake associated with prominent left axillary lymph node with SUV max of 3.35. Residual tracer avid nodal metastasis not excluded. 3. The previously noted liver metastasis are no longer visible on the CT images from today's study. There is no abnormal increased uptake within the left hepatic lobe above background liver activity to suggest metabolically active tumor metastasis. 4. Interval increase in sclerosis associated with previously noted lytic bone metastasis without significant increased uptake above background bone marrow activity compatible with healing multifocal bone metastasis. 5. New pathologic fracture involves the L3 vertebral body where there was extensive lytic changes noted on the previous CT. The collapsed vertebral body is now sclerotic with mild low level FDG uptake. 6. Similar appearance of soft tissue infiltration within  the anterior mediastinal fat which exhibits mild FDG uptake on today's study. 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   07/16/2021 Surgery   S/p BSO, benign pathology    08/26/2021 Mammogram   Mammo diagnostic breast tomosynthesis left: There is an additional  irregular mass in the lower inner position at posterior depth. This was  further evaluated with ultrasound.   Mammo UKoreabreast limited left: There is a 0.7 cm x 0.5 cm x 0.7 cm  irregularly shaped, hypoechoic mass with indistinct margins seen in the  left breast at 8 o'clock, 8 cm from the nipple. This mass is 1.6 cm medial  to the palpable mass as detailed above.    08/26/2021 Pathology Results   New left breast mass 8:00 biopsy: Invasive carcinoma with mixed ductal and lobular and focal micropapillary features.  Grade 3, invasive carcinoma measures 9 mm with an associated lymphoid reaction.  ER negative PR negative HER2 1+.  Left breast mass 2:00 biopsy: Invasive carcinoma with mixed ductal and lobular features Nottingham grade 2 measuring 1.4 cm.  ER 9% (low positive), PR negative, HER2 1+.   09/16/2021 PET scan   1. Interval progression of hypermetabolic metastatic disease. 2. Hypermetabolic bilateral axillary lymphadenopathy, subcarinal and bilateral hilar lymphadenopathy, new/progressive. 3. New hypermetabolic nodularity in the lower left breast is compatible with progressive malignancy. 4. New hypermetabolic mixed lytic and sclerotic left pelvic bone metastases as detailed. 5. Persistent patchy hypermetabolism in the anterior mediastinum associated with mixed soft tissue and fat density, favor thymic hyperplasia.   09/17/2021 - 10/07/2021 Chemotherapy   Patient is on Treatment Plan : BREAST METASTATIC fam-trastuzumab deruxtecan-nxki (Enhertu) q21d     09/17/2021 - 10/29/2021 Chemotherapy   Patient is on  Treatment Plan : BREAST METASTATIC Fam-Trastuzumab Deruxtecan-nxki (Enhertu) (5.4) q21d     11/11/2021 PET  scan    1. Hypermetabolic left breast soft tissue and skin thickening is increased  from prior examination and suspicious for worsening primary malignancy.   2. There is evidence of interval worsening hypermetabolic adenopathy.  Primary differential consideration is worsening nodal metastasis, however  given the distribution the possibility of sarcoid-like reaction in the  setting of therapy is raised. There is worsening hypermetabolic left  axillary adenopathy with new areas of hypermetabolic adenopathy seen in the  left subpectoral, internal mammary, cardiophrenic, right axillary,  mediastinal, and bilateral hilar nodal stations. A few nonspecific FDG avid  nonenlarged left pelvic/inguinal nodes are also seen. Tissue sampling  should be considered for definitive assessment, otherwise close follow-up  is recommended.   3. Interval increased hypermetabolic activity associated with osseous  metastasis in the left hemipelvis. Other nonavid sclerotic bone lesions may  reflect sites of treated disease.   Electronically Reviewed by:  Mahala Menghini, MD, [facility]  Electronically Reviewed on:  11/11/2021 6:13 PM    11/19/2021 -  Chemotherapy   Patient is on Treatment Plan : BREAST Carboplatin + Gemcitabine D1,8 q21d       CHIEF COMPLIANT:   INTERVAL HISTORY: Ellen [name] is a   ALLERGIES:  is allergic to other.  MEDICATIONS:  Current Outpatient Medications  Medication Sig Dispense Refill   acyclovir (ZOVIRAX) 800 MG tablet Take 1 tablet (800 mg total) by mouth 2 (two) times daily as needed (flare up). 60 tablet 5   acyclovir ointment (ZOVIRAX) 5 % Apply 1 application. topically 4 (four) times daily as needed (outbreak).     albuterol (VENTOLIN HFA) 108 (90 Base) MCG/ACT inhaler Inhale 1 puff every 4 hours by inhalation route.     Calcium Carb-Cholecalciferol 500-10 MG-MCG TABS Take by mouth.     Cholecalciferol 50 MCG (2000 UT) CAPS Take by mouth.     lidocaine-prilocaine  (EMLA) cream      lisdexamfetamine (VYVANSE) 40 MG capsule Take 1 capsule by mouth daily. (Patient not taking: Reported on 12/27/2021)     LORazepam (ATIVAN) 0.5 MG tablet Take 1 tablet (0.5 mg total) by mouth every 8 (eight) hours as needed for anxiety. 30 tablet 0   magnesium oxide (MAG-OX) 400 (240 Mg) MG tablet Take 1 tablet (400 mg total) by mouth daily. 30 tablet 3   ondansetron (ZOFRAN) 8 MG tablet Take 1 tablet (8 mg total) by mouth every 8 (eight) hours as needed for nausea or vomiting. 20 tablet 0   pantoprazole (PROTONIX) 40 MG tablet Take 1 tablet (40 mg total) by mouth daily. 30 tablet 3   prochlorperazine (COMPAZINE) 10 MG tablet Take 1 tablet (10 mg total) by mouth every 6 (six) hours as needed for nausea or vomiting. 30 tablet 0   venlafaxine XR (EFFEXOR-XR) 150 MG 24 hr capsule Take 1 capsule (150 mg total) by mouth daily with breakfast. 30 capsule 6   No current facility-administered medications for this visit.    PHYSICAL EXAMINATION: ECOG PERFORMANCE STATUS: {CHL ONC ECOG PS:[phone]}  There were no vitals filed for this visit. There were no vitals filed for this visit.  BREAST:*** No palpable masses or nodules in either right or left breasts. No palpable axillary supraclavicular or infraclavicular adenopathy no breast tenderness or nipple discharge. (exam performed in the presence of a chaperone)  LABORATORY DATA:  I have reviewed the data as listed    Latest Ref  Rng & Units 12/27/2021    9:44 AM 12/19/2021   12:24 PM 12/16/2021    9:33 AM  CMP  Glucose 70 - 99 mg/dL 103  107  134   BUN 6 - 20 mg/dL _0 Creatinine 0.44 - 1.00 mg/dL 0.62  0.74  0.68   Sodium 135 - 145 mmol/L 141  139  139   Potassium 3.5 - 5.1 mmol/L 3.9  4.4  3.7   Chloride 98 - 111 mmol/L 110  105  106   CO2 22 - 32 mmol/L _1 Calcium 8.9 - 10.3 mg/dL 9.0  9.8  9.1   Total Protein 6.5 - 8.1 g/dL 7.1  7.3  7.2   Total Bilirubin 0.3 - 1.2 mg/dL 0.3  0.2  0.2   Alkaline Phos  38 - 126 U/L 54  70  61   AST 15 - 41 U/L 38  30  34   ALT 0 - 44 U/L 42  34  54     Lab Results  Component Value Date   WBC 3.9 (L) 12/27/2021   HGB 10.0 (L) 12/27/2021   HCT 29.0 (L) 12/27/2021   MCV 89.5 12/27/2021   PLT 158 12/27/2021   NEUTROABS 2.1 12/27/2021    ASSESSMENT & PLAN:  No problem-specific Assessment & Plan notes found for this encounter.    No orders of the defined types were placed in this encounter.  The patient has a good understanding of the overall plan. she agrees with it. she will call with any problems that may develop before the next visit here. Total time spent: 30 mins including face to face time and time spent for planning, charting and co-ordination of care   Suzzette Righter, [facility] 01/06/22    I Gardiner Coins am scribing for Dr. Lindi Adie  ***

## 2022-01-07 ENCOUNTER — Encounter: Payer: Self-pay | Admitting: Hematology and Oncology

## 2022-01-07 ENCOUNTER — Inpatient Hospital Stay (HOSPITAL_BASED_OUTPATIENT_CLINIC_OR_DEPARTMENT_OTHER): Payer: Self-pay | Admitting: Hematology and Oncology

## 2022-01-07 ENCOUNTER — Inpatient Hospital Stay: Payer: Self-pay

## 2022-01-07 ENCOUNTER — Encounter: Payer: Self-pay | Admitting: Licensed Clinical Social Worker

## 2022-01-07 VITALS — HR 96

## 2022-01-07 VITALS — BP 121/67 | HR 120 | Temp 98.7°F | Resp 18 | Ht 65.0 in | Wt 144.9 lb

## 2022-01-07 DIAGNOSIS — Z5111 Encounter for antineoplastic chemotherapy: Secondary | ICD-10-CM | POA: Diagnosis not present

## 2022-01-07 DIAGNOSIS — C50919 Malignant neoplasm of unspecified site of unspecified female breast: Secondary | ICD-10-CM

## 2022-01-07 DIAGNOSIS — Z95828 Presence of other vascular implants and grafts: Secondary | ICD-10-CM

## 2022-01-07 DIAGNOSIS — C50912 Malignant neoplasm of unspecified site of left female breast: Secondary | ICD-10-CM

## 2022-01-07 DIAGNOSIS — C7951 Secondary malignant neoplasm of bone: Secondary | ICD-10-CM

## 2022-01-07 LAB — CMP (CANCER CENTER ONLY)
ALT: 68 U/L — ABNORMAL HIGH (ref 0–44)
AST: 36 U/L (ref 15–41)
Albumin: 4.3 g/dL (ref 3.5–5.0)
Alkaline Phosphatase: 129 U/L — ABNORMAL HIGH (ref 38–126)
Anion gap: 6 (ref 5–15)
BUN: 9 mg/dL (ref 6–20)
CO2: 26 mmol/L (ref 22–32)
Calcium: 9 mg/dL (ref 8.9–10.3)
Chloride: 108 mmol/L (ref 98–111)
Creatinine: 0.66 mg/dL (ref 0.44–1.00)
GFR, Estimated: >60 mL/min (ref >60)
Glucose, Bld: 112 mg/dL — ABNORMAL HIGH (ref 70–99)
Potassium: 4 mmol/L (ref 3.5–5.1)
Sodium: 140 mmol/L (ref 135–145)
Total Bilirubin: 0.3 mg/dL (ref 0.3–1.2)
Total Protein: 7.1 g/dL (ref 6.5–8.1)

## 2022-01-07 LAB — CBC WITH DIFFERENTIAL (CANCER CENTER ONLY)
Abs Immature Granulocytes: 2.65 10*3/uL — ABNORMAL HIGH (ref 0.00–0.07)
Basophils Absolute: 0.1 10*3/uL (ref 0.0–0.1)
Basophils Relative: 0 %
Eosinophils Absolute: 0 10*3/uL (ref 0.0–0.5)
Eosinophils Relative: 0 %
HCT: 28.2 % — ABNORMAL LOW (ref 36.0–46.0)
Hemoglobin: 9.7 g/dL — ABNORMAL LOW (ref 12.0–15.0)
Immature Granulocytes: 10 %
Lymphocytes Relative: 8 %
Lymphs Abs: 2.2 10*3/uL (ref 0.7–4.0)
MCH: 32.3 pg (ref 26.0–34.0)
MCHC: 34.4 g/dL (ref 30.0–36.0)
MCV: 94 fL (ref 80.0–100.0)
Monocytes Absolute: 2.2 10*3/uL — ABNORMAL HIGH (ref 0.1–1.0)
Monocytes Relative: 8 %
Neutro Abs: 19.8 10*3/uL — ABNORMAL HIGH (ref 1.7–7.7)
Neutrophils Relative %: 74 %
Platelet Count: 69 10*3/uL — ABNORMAL LOW (ref 150–400)
RBC: 3 MIL/uL — ABNORMAL LOW (ref 3.87–5.11)
RDW: 16.5 % — ABNORMAL HIGH (ref 11.5–15.5)
Smear Review: NORMAL
WBC Count: 27 10*3/uL — ABNORMAL HIGH (ref 4.0–10.5)
nRBC: 0.6 % — ABNORMAL HIGH (ref 0.0–0.2)

## 2022-01-07 MED ORDER — SODIUM CHLORIDE 0.9 % IV SOLN
650.0000 mg/m2 | Freq: Once | INTRAVENOUS | Status: AC
  Administered 2022-01-07: 1102 mg via INTRAVENOUS
  Filled 2022-01-07: qty 28.98

## 2022-01-07 MED ORDER — PALONOSETRON HCL INJECTION 0.25 MG/5ML
0.2500 mg | Freq: Once | INTRAVENOUS | Status: AC
  Administered 2022-01-07: 0.25 mg via INTRAVENOUS
  Filled 2022-01-07: qty 5

## 2022-01-07 MED ORDER — SODIUM CHLORIDE 0.9 % IV SOLN: Freq: Once | INTRAVENOUS | Status: AC

## 2022-01-07 MED ORDER — SODIUM CHLORIDE 0.9% FLUSH
10.0000 mL | Freq: Once | INTRAVENOUS | Status: AC
  Administered 2022-01-07: 10 mL

## 2022-01-07 MED ORDER — HEPARIN SOD (PORK) LOCK FLUSH 100 UNIT/ML IV SOLN
500.0000 [IU] | Freq: Once | INTRAVENOUS | Status: AC | PRN
  Administered 2022-01-07: 500 [IU]

## 2022-01-07 MED ORDER — SODIUM CHLORIDE 0.9% FLUSH
10.0000 mL | INTRAVENOUS | Status: DC | PRN
  Administered 2022-01-07: 10 mL

## 2022-01-07 MED ORDER — SODIUM CHLORIDE 0.9 % IV SOLN
131.9000 mg | Freq: Once | INTRAVENOUS | Status: AC
  Administered 2022-01-07: 130 mg via INTRAVENOUS
  Filled 2022-01-07: qty 13

## 2022-01-07 MED ORDER — LORAZEPAM 0.5 MG PO TABS: 0.5000 mg | ORAL_TABLET | Freq: Three times a day (TID) | ORAL | 1 refills | Status: DC | PRN

## 2022-01-07 MED ORDER — SODIUM CHLORIDE 0.9 % IV SOLN
10.0000 mg | Freq: Once | INTRAVENOUS | Status: AC
  Administered 2022-01-07: 10 mg via INTRAVENOUS
  Filled 2022-01-07: qty 10

## 2022-01-07 NOTE — Assessment & Plan Note (Signed)
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, FISH positive ratio 2.8, Ki-67 25%, lymph node positive   Palliative radiation completed 09/03/2020 Taxotere/Herceptin/Perjeta 08/31/2020-12/18/2020 Herceptin Perjeta Maintenance  beginning 01/08/2021-08/27/2021 Enhertu: 09/17/2021-10/29/2021: Discontinued for progression     PET/CT 07/07/2021: Interval decrease in the left breast mass having an SUV of 2.7, small nodule left breast SUV 5.6, left axillary lymph node SUV 3.35, liver metastases no longer visible.  Sclerosis involving bone metastases.  New compression fracture L3 vertebral spine no sclerotic   Biopsy of the breast at [facility] on 08/26/2021: IDC with lobular features ER 90%, PR 0%, HER2 1+ (low) ------------------------------------------------------------------------------------------------------------------------------------ Current treatment: Carboplatin and gemcitabine, today cycle 3-day 1  Toxicities:  1.  Pancytopenia: We had to hold treatment last week because of cytopenias.   2.  Palpitations: Much improved We will plan to give Neupogen injections prior to day 8 of each cycle. She will get Neulasta injection on day 9 or 10 of each cycle.   Return to clinic in 1 week for cycle 3-day 8 of chemo

## 2022-01-07 NOTE — Patient Instructions (Signed)
[facility] ONCOLOGY  Discharge Instructions: Thank you for choosing [facility] to provide your oncology and hematology care.   If you have a lab appointment with the [facility], please go directly to the [facility] and check in at the registration area.   Wear comfortable clothing and clothing appropriate for easy access to any Portacath or PICC line.   We strive to give you quality time with your provider. You may need to reschedule your appointment if you arrive late (15 or more minutes).  Arriving late affects you and other patients whose appointments are after yours.  Also, if you miss three or more appointments without notifying the office, you may be dismissed from the clinic at the provider's discretion.      For prescription refill requests, have your pharmacy contact our office and allow 72 hours for refills to be completed.    Today you received the following chemotherapy and/or immunotherapy agents: Gemcitabine and Carboplatin.   To help prevent nausea and vomiting after your treatment, we encourage you to take your nausea medication as directed.  BELOW ARE SYMPTOMS THAT SHOULD BE REPORTED IMMEDIATELY: *FEVER GREATER THAN 100.4 F (38 C) OR HIGHER *CHILLS OR SWEATING *NAUSEA AND VOMITING THAT IS NOT CONTROLLED WITH YOUR NAUSEA MEDICATION *UNUSUAL SHORTNESS OF BREATH *UNUSUAL BRUISING OR BLEEDING *URINARY PROBLEMS (pain or burning when urinating, or frequent urination) *BOWEL PROBLEMS (unusual diarrhea, constipation, pain near the anus) TENDERNESS IN MOUTH AND THROAT WITH OR WITHOUT PRESENCE OF ULCERS (sore throat, sores in mouth, or a toothache) UNUSUAL RASH, SWELLING OR PAIN  UNUSUAL VAGINAL DISCHARGE OR ITCHING   Items with * indicate a potential emergency and should be followed up as soon as possible or go to the Emergency Department if any problems should occur.  Please show the CHEMOTHERAPY ALERT CARD or IMMUNOTHERAPY ALERT CARD  at check-in to the Emergency Department and triage nurse.  Should you have questions after your visit or need to cancel or reschedule your appointment, please contact [facility]  Dept: [phone]  and follow the prompts.  Office hours are 8:00 a.m. to 4:30 p.m. Monday - Friday. Please note that voicemails left after 4:00 p.m. may not be returned until the following business day.  We are closed weekends and major holidays. You have access to a nurse at all times for urgent questions. Please call the main number to the clinic Dept: [phone] and follow the prompts.   For any non-urgent questions, you may also contact your provider using MyChart. We now offer e-Visits for anyone 48 and older to request care online for non-urgent symptoms. For details visit [email or web address].   Also download the MyChart app! Go to the app store, search "MyChart", open the app, select [facility], and log in with your MyChart username and password.  Masks are optional in the cancer centers. If you would like for your care team to wear a mask while they are taking care of you, please let them know. You may have one support person who is at least 27 years old accompany you for your appointments.
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
## 2022-01-12 NOTE — Progress Notes (Signed)
Patient Care Team: Pcp, No as PCP - General [name], MD as Consulting Physician (Hematology and Oncology) [name], [name], NP as Nurse Practitioner (Hematology and Oncology) [name], MD as Consulting Physician (General Surgery)  DIAGNOSIS:  Encounter Diagnoses  Name Primary?   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  Prominent left axillary lymph node cluster of adjacent lymph nodes, irregular nodule right middle lobe 1.1 cm, aggressive lesion right second rib cortical destruction, multiple lucent lesions throughout the thoracic spine including T1 vertebral body and T2, irregular multilobar lesion central left hepatic lobe 4.3 x 4.7 cm.  Several satellite lesions in the left lateral hepatic lobe.  Multiple sclerotic lesions in the bones iliac wings, acetabular, medial iliac bones, sacrum multiple lesions lumbar spine L3-L4 and L5   08/17/2020 Initial Diagnosis   Biopsy revealed IDC with DCIS grade 2, ER 30%, PR 20%, HER2 equivocal by IHC, FISH positive, Ki-67 25%, lymph node positive    Genetic Testing   Negative genetic testing:  No pathogenic variants detected on the [employer] CancerNext-Expanded + RNAinsight panel. The report date is 09/06/2020.   The CancerNext-Expanded + RNAinsight gene panel offered by [employer] and includes sequencing and rearrangement analysis for the following 77 genes: AIP, ALK, APC, ATM, AXIN2, BAP1, BARD1, BLM, BMPR1A, BRCA1, BRCA2, BRIP1, CDC73, CDH1, CDK4, CDKN1B, CDKN2A, CHEK2, CTNNA1, DICER1, FANCC, FH, FLCN, GALNT12, KIF1B, LZTR1, MAX, MEN1, MET, MLH1, MSH2, MSH3, MSH6, MUTYH, NBN, NF1, NF2, NTHL1, PALB2, PHOX2B, PMS2, POT1, PRKAR1A, PTCH1, PTEN, RAD51C, RAD51D, RB1, RECQL,  RET, SDHA, SDHAF2, SDHB, SDHC, SDHD, SMAD4, SMARCA4, SMARCB1, SMARCE1, STK11, SUFU, TMEM127, TP53, TSC1, TSC2, VHL and XRCC2 (sequencing and deletion/duplication); EGFR, EGLN1, HOXB13, KIT, MITF, PDGFRA, POLD1 and POLE (sequencing only); EPCAM and GREM1 (deletion/duplication only). RNA data is routinely analyzed for use in variant interpretation for all genes.   09/17/2021 - 10/07/2021 Chemotherapy   Patient is on Treatment Plan : BREAST METASTATIC fam-trastuzumab deruxtecan-nxki (Enhertu) q21d     09/17/2021 - 10/29/2021 Chemotherapy   Patient is on Treatment Plan : BREAST METASTATIC Fam-Trastuzumab Deruxtecan-nxki (Enhertu) (5.4) q21d     11/19/2021 -  Chemotherapy   Patient is on Treatment Plan : BREAST Carboplatin + Gemcitabine D1,8 q21d     Carcinoma of breast metastatic to bone ([place])  08/17/2020 Initial Diagnosis   Carcinoma of breast metastatic to bone ([place]); Goserelin/Zoladex started and to be given every 4 weeks    Genetic Testing   Negative genetic testing:  No pathogenic variants detected on the [employer] CancerNext-Expanded + RNAinsight panel. The report date is 09/06/2020.   The CancerNext-Expanded + RNAinsight gene panel offered by [employer] and includes sequencing and rearrangement analysis for the following 77 genes: AIP, ALK, APC, ATM, AXIN2, BAP1, BARD1, BLM, BMPR1A, BRCA1, BRCA2, BRIP1, CDC73, CDH1, CDK4, CDKN1B, CDKN2A, CHEK2, CTNNA1, DICER1, FANCC, FH, FLCN, GALNT12, KIF1B, LZTR1, MAX, MEN1, MET, MLH1, MSH2, MSH3, MSH6, MUTYH, NBN, NF1, NF2, NTHL1, PALB2, PHOX2B, PMS2, POT1, PRKAR1A, PTCH1, PTEN, RAD51C, RAD51D, RB1, RECQL, RET, SDHA, SDHAF2, SDHB, SDHC, SDHD, SMAD4, SMARCA4, SMARCB1, SMARCE1, STK11, SUFU, TMEM127, TP53, TSC1, TSC2, VHL and XRCC2 (sequencing and deletion/duplication); EGFR, EGLN1, HOXB13, KIT, MITF, PDGFRA, POLD1 and POLE (sequencing only); EPCAM and GREM1 (deletion/duplication only). RNA data is routinely analyzed  for use in variant interpretation for all genes.    08/18/2020 - 08/30/2020 Radiation Therapy   Palliative radiation to the lumbar and pelvic bone; 30 Gy in 10 fractions   08/31/2020 - 12/18/2020 Adjuvant Chemotherapy   Started with Herceptin/Perjeta on 08/31/2020 and Taxotere on 09/04/2020; she was hospitalized with dehydration and refractory diarrhea for 11 days, she resumed treatment with Taxotere (dose reduced by 59m/m2) and Herceptin only (perjeta discontinued) on 09/26/2020, reintroduced 10/17/2020   09/03/2020 Cancer Staging   Staging form: Breast, AJCC 8th Edition - Clinical stage from 09/03/2020: Stage IV (cT4b, cN1, cM1, G2, ER+, PR+, HER2+) - Signed by CGardenia Phlegm NP on 10/03/2020 Stage prefix: Initial diagnosis Method of lymph node assessment: Clinical Histologic grading system: 3 grade system   01/08/2021 - 08/27/2021 Adjuvant Chemotherapy   Herceptin/Perjeta every 3 weeks, Zometa every 12 weeks added 01/29/2021   07/07/2021 PET scan   1. Interval decrease in size of the dominant left breast mass which now exhibits mild low level FDG uptake with SUV max of 2.7. Small nodule within the left breast exhibits moderate tracer uptake within SUV max of 5.6 and is suspicious for residual/recurrent local tumor. 2. Mild tracer uptake associated with prominent left axillary lymph node with SUV max of 3.35. Residual tracer avid nodal metastasis not excluded. 3. The previously noted liver metastasis are no longer visible on the CT images from today's study. There is no abnormal increased uptake within the left hepatic lobe above background liver activity to suggest metabolically active tumor metastasis. 4. Interval increase in sclerosis associated with previously noted lytic bone metastasis without significant increased uptake above background bone marrow activity compatible with healing multifocal bone metastasis. 5. New pathologic fracture involves the L3 vertebral body where there was extensive lytic changes noted on the  previous CT. The collapsed vertebral body is now sclerotic with mild low level FDG uptake. 6. Similar appearance of soft tissue infiltration within the anterior mediastinal fat which exhibits mild FDG uptake on today's study. 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   07/16/2021 Surgery   S/p BSO, benign pathology    08/26/2021 Mammogram   Mammo diagnostic breast tomosynthesis left: There is an additional  irregular mass in the lower inner position at posterior depth. This was  further evaluated with ultrasound.   Mammo UKoreabreast limited left: There is a 0.7 cm x 0.5 cm x 0.7 cm  irregularly shaped, hypoechoic mass with indistinct margins seen in the  left breast at 8 o'clock, 8 cm from the nipple. This mass is 1.6 cm medial  to the palpable mass as detailed above.    08/26/2021 Pathology Results   New left breast mass 8:00 biopsy: Invasive carcinoma with mixed ductal and lobular and focal micropapillary features.  Grade 3, invasive carcinoma measures 9 mm with an associated lymphoid reaction.  ER negative PR negative HER2 1+.  Left breast mass 2:00 biopsy: Invasive carcinoma with mixed ductal and lobular features Nottingham grade 2 measuring 1.4 cm.  ER 9% (low positive), PR negative, HER2 1+.   09/16/2021 PET scan   1. Interval progression of hypermetabolic metastatic disease. 2. Hypermetabolic bilateral axillary lymphadenopathy, subcarinal and bilateral hilar lymphadenopathy, new/progressive. 3. New hypermetabolic nodularity in the lower left breast is compatible with progressive malignancy. 4. New hypermetabolic mixed lytic and sclerotic left pelvic bone metastases as detailed. 5. Persistent patchy hypermetabolism in the anterior mediastinum associated with mixed soft tissue and fat density, favor thymic hyperplasia.   09/17/2021 - 10/07/2021 Chemotherapy  Patient is on Treatment Plan : BREAST METASTATIC fam-trastuzumab deruxtecan-nxki (Enhertu) q21d      09/17/2021 - 10/29/2021 Chemotherapy   Patient is on Treatment Plan : BREAST METASTATIC Fam-Trastuzumab Deruxtecan-nxki (Enhertu) (5.4) q21d     11/11/2021 PET scan    1. Hypermetabolic left breast soft tissue and skin thickening is increased  from prior examination and suspicious for worsening primary malignancy.   2. There is evidence of interval worsening hypermetabolic adenopathy.  Primary differential consideration is worsening nodal metastasis, however  given the distribution the possibility of sarcoid-like reaction in the  setting of therapy is raised. There is worsening hypermetabolic left  axillary adenopathy with new areas of hypermetabolic adenopathy seen in the  left subpectoral, internal mammary, cardiophrenic, right axillary,  mediastinal, and bilateral hilar nodal stations. A few nonspecific FDG avid  nonenlarged left pelvic/inguinal nodes are also seen. Tissue sampling  should be considered for definitive assessment, otherwise close follow-up  is recommended.   3. Interval increased hypermetabolic activity associated with osseous  metastasis in the left hemipelvis. Other nonavid sclerotic bone lesions may  reflect sites of treated disease.   Electronically Reviewed by:  Mahala Menghini, MD, [facility]  Electronically Reviewed on:  11/11/2021 6:13 PM    11/19/2021 -  Chemotherapy   Patient is on Treatment Plan : BREAST Carboplatin + Gemcitabine D1,8 q21d       CHIEF COMPLIANT: cycle 3-day 8 of chemo   INTERVAL HISTORY: PAQUITA PRINTY is a 26 y.o female here forcycle 3-day 8 of chemo  She states that last treatment was good she didn't feel the heart race. She also says she wasn't as tired. She reports she feels like she is fatigue.   ALLERGIES:  is allergic to other.  MEDICATIONS:  Current Outpatient Medications  Medication Sig Dispense Refill   acyclovir (ZOVIRAX) 800 MG tablet Take 1 tablet (800 mg total) by mouth 2 (two) times daily as needed (flare up). 60  tablet 5   acyclovir ointment (ZOVIRAX) 5 % Apply 1 application. topically 4 (four) times daily as needed (outbreak).     albuterol (VENTOLIN HFA) 108 (90 Base) MCG/ACT inhaler Inhale 1 puff every 4 hours by inhalation route.     Calcium Carb-Cholecalciferol 500-10 MG-MCG TABS Take by mouth.     Cholecalciferol 50 MCG (2000 UT) CAPS Take by mouth.     lidocaine-prilocaine (EMLA) cream      lisdexamfetamine (VYVANSE) 40 MG capsule Take 1 capsule by mouth daily.     LORazepam (ATIVAN) 0.5 MG tablet Take 1 tablet (0.5 mg total) by mouth every 8 (eight) hours as needed for anxiety. 30 tablet 1   magnesium oxide (MAG-OX) 400 (240 Mg) MG tablet Take 1 tablet (400 mg total) by mouth daily. 30 tablet 3   ondansetron (ZOFRAN) 8 MG tablet Take 1 tablet (8 mg total) by mouth every 8 (eight) hours as needed for nausea or vomiting. 20 tablet 0   pantoprazole (PROTONIX) 40 MG tablet Take 1 tablet (40 mg total) by mouth daily. 30 tablet 3   prochlorperazine (COMPAZINE) 10 MG tablet Take 1 tablet (10 mg total) by mouth every 6 (six) hours as needed for nausea or vomiting. 30 tablet 0   venlafaxine XR (EFFEXOR-XR) 150 MG 24 hr capsule Take 1 capsule (150 mg total) by mouth daily with breakfast. 30 capsule 6   No current facility-administered medications for this visit.    PHYSICAL EXAMINATION: ECOG PERFORMANCE STATUS: 1 - Symptomatic but completely ambulatory  Vitals:  01/15/22 0920  BP: 120/61  Pulse: (!) 113  Resp: 18  Temp: 97.8 F (36.6 C)  SpO2: 100%   Filed Weights   01/15/22 0920  Weight: 145 lb 1.6 oz (65.8 kg)      LABORATORY DATA:  I have reviewed the data as listed    Latest Ref Rng & Units 01/15/2022    9:06 AM 01/07/2022   11:35 AM 12/27/2021    9:44 AM  CMP  Glucose 70 - 99 mg/dL 108  112  103   BUN 6 - 20 mg/dL _0 Creatinine 0.44 - 1.00 mg/dL 0.61  0.66  0.62   Sodium 135 - 145 mmol/L 138  140  141   Potassium 3.5 - 5.1 mmol/L 4.2  4.0  3.9   Chloride 98 - 111  mmol/L 110  108  110   CO2 22 - 32 mmol/L _1 Calcium 8.9 - 10.3 mg/dL 9.0  9.0  9.0   Total Protein 6.5 - 8.1 g/dL 7.1  7.1  7.1   Total Bilirubin 0.3 - 1.2 mg/dL 0.3  0.3  0.3   Alkaline Phos 38 - 126 U/L 77  129  54   AST 15 - 41 U/L 61  36  38   ALT 0 - 44 U/L 160  68  42     Lab Results  Component Value Date   WBC 6.2 01/15/2022   HGB 8.8 (L) 01/15/2022   HCT 25.8 (L) 01/15/2022   MCV 92.8 01/15/2022   PLT 82 (L) 01/15/2022   NEUTROABS 4.3 01/15/2022    ASSESSMENT & PLAN:  Carcinoma of breast metastatic to bone ([place]) 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, FISH positive ratio 2.8, Ki-67 25%, lymph node positive   Palliative radiation completed 09/03/2020 Taxotere/Herceptin/Perjeta 08/31/2020-12/18/2020 Herceptin Perjeta Maintenance  beginning 01/08/2021-08/27/2021 Enhertu: 09/17/2021-10/29/2021: Discontinued for progression     PET/CT 07/07/2021: Interval decrease in the left breast mass having an SUV of 2.7, small nodule left breast SUV 5.6, left axillary lymph node SUV 3.35, liver metastases no longer visible.  Sclerosis involving bone metastases.  New compression fracture L3 vertebral spine no sclerotic   Biopsy of the breast at [facility] on 08/26/2021: IDC with lobular features ER 90%, PR 0%, HER2 1+ (low) ------------------------------------------------------------------------------------------------------------------------------------ Current treatment: Carboplatin and gemcitabine, today cycle 3-day 8  Toxicities:  1.  Pancytopenia:   She receives Neulasta on day 8 2.  Palpitations: Much improved secondary to anemia.  We might have to transfuse her if her hemoglobin drops below 8 3.  Thrombocytopenia: Today's platelet count is 82.  Okay to proceed 4.  Slight elevation of LFTs: Proceed with  treatment.  She will get Neulasta on pro injection with the day 8 chemo. We will obtain CT chest abdomen pelvis and see her back along with her fourth cycle to discuss results. Return to clinic to review the results of the scans in 2 weeks and for cycle 4    No orders of the defined types were placed in this encounter.  The patient has a good understanding of the overall plan. she agrees with it. she will call with any problems that may develop before the next visit here. Total time spent: 30 mins including face to face time and  time spent for planning, charting and co-ordination of care   Harriette Ohara, MD 01/15/22    I Gardiner Coins am scribing for Dr. Lindi Adie  I have reviewed the above documentation for accuracy and completeness, and I agree with the above.

## 2022-01-13 ENCOUNTER — Ambulatory Visit: Payer: Self-pay | Admitting: Adult Health

## 2022-01-13 ENCOUNTER — Ambulatory Visit: Payer: Self-pay

## 2022-01-13 ENCOUNTER — Other Ambulatory Visit: Payer: Self-pay

## 2022-01-14 ENCOUNTER — Other Ambulatory Visit: Payer: Medicaid Other

## 2022-01-14 ENCOUNTER — Ambulatory Visit: Payer: Medicaid Other

## 2022-01-14 ENCOUNTER — Ambulatory Visit: Payer: Medicaid Other | Admitting: Hematology and Oncology

## 2022-01-14 MED FILL — Dexamethasone Sodium Phosphate Inj 100 MG/10ML: INTRAMUSCULAR | Qty: 1 | Status: AC

## 2022-01-15 ENCOUNTER — Other Ambulatory Visit: Payer: Medicaid Other

## 2022-01-15 ENCOUNTER — Inpatient Hospital Stay (HOSPITAL_BASED_OUTPATIENT_CLINIC_OR_DEPARTMENT_OTHER): Payer: Self-pay | Admitting: Hematology and Oncology

## 2022-01-15 ENCOUNTER — Ambulatory Visit: Payer: Medicaid Other

## 2022-01-15 ENCOUNTER — Ambulatory Visit: Payer: Medicaid Other | Admitting: Hematology and Oncology

## 2022-01-15 ENCOUNTER — Inpatient Hospital Stay: Payer: Self-pay

## 2022-01-15 ENCOUNTER — Encounter: Payer: Self-pay | Admitting: General Practice

## 2022-01-15 VITALS — BP 111/63 | HR 99 | Resp 16

## 2022-01-15 VITALS — BP 120/61 | HR 113 | Temp 97.8°F | Resp 18 | Ht 65.0 in | Wt 145.1 lb

## 2022-01-15 DIAGNOSIS — C50912 Malignant neoplasm of unspecified site of left female breast: Secondary | ICD-10-CM

## 2022-01-15 DIAGNOSIS — Z5111 Encounter for antineoplastic chemotherapy: Secondary | ICD-10-CM | POA: Diagnosis not present

## 2022-01-15 DIAGNOSIS — C50919 Malignant neoplasm of unspecified site of unspecified female breast: Secondary | ICD-10-CM

## 2022-01-15 DIAGNOSIS — Z95828 Presence of other vascular implants and grafts: Secondary | ICD-10-CM

## 2022-01-15 DIAGNOSIS — C7951 Secondary malignant neoplasm of bone: Secondary | ICD-10-CM

## 2022-01-15 LAB — CMP (CANCER CENTER ONLY)
ALT: 160 U/L — ABNORMAL HIGH (ref 0–44)
AST: 61 U/L — ABNORMAL HIGH (ref 15–41)
Albumin: 4.3 g/dL (ref 3.5–5.0)
Alkaline Phosphatase: 77 U/L (ref 38–126)
Anion gap: 4 — ABNORMAL LOW (ref 5–15)
BUN: 12 mg/dL (ref 6–20)
CO2: 24 mmol/L (ref 22–32)
Calcium: 9 mg/dL (ref 8.9–10.3)
Chloride: 110 mmol/L (ref 98–111)
Creatinine: 0.61 mg/dL (ref 0.44–1.00)
GFR, Estimated: >60 mL/min (ref >60)
Glucose, Bld: 108 mg/dL — ABNORMAL HIGH (ref 70–99)
Potassium: 4.2 mmol/L (ref 3.5–5.1)
Sodium: 138 mmol/L (ref 135–145)
Total Bilirubin: 0.3 mg/dL (ref 0.3–1.2)
Total Protein: 7.1 g/dL (ref 6.5–8.1)

## 2022-01-15 LAB — CBC WITH DIFFERENTIAL (CANCER CENTER ONLY)
Abs Immature Granulocytes: 0.03 10*3/uL (ref 0.00–0.07)
Basophils Absolute: 0 10*3/uL (ref 0.0–0.1)
Basophils Relative: 0 %
Eosinophils Absolute: 0 10*3/uL (ref 0.0–0.5)
Eosinophils Relative: 1 %
HCT: 25.8 % — ABNORMAL LOW (ref 36.0–46.0)
Hemoglobin: 8.8 g/dL — ABNORMAL LOW (ref 12.0–15.0)
Immature Granulocytes: 1 %
Lymphocytes Relative: 23 %
Lymphs Abs: 1.4 10*3/uL (ref 0.7–4.0)
MCH: 31.7 pg (ref 26.0–34.0)
MCHC: 34.1 g/dL (ref 30.0–36.0)
MCV: 92.8 fL (ref 80.0–100.0)
Monocytes Absolute: 0.4 10*3/uL (ref 0.1–1.0)
Monocytes Relative: 6 %
Neutro Abs: 4.3 10*3/uL (ref 1.7–7.7)
Neutrophils Relative %: 69 %
Platelet Count: 82 10*3/uL — ABNORMAL LOW (ref 150–400)
RBC: 2.78 MIL/uL — ABNORMAL LOW (ref 3.87–5.11)
RDW: 16.2 % — ABNORMAL HIGH (ref 11.5–15.5)
WBC Count: 6.2 10*3/uL (ref 4.0–10.5)
nRBC: 0.6 % — ABNORMAL HIGH (ref 0.0–0.2)

## 2022-01-15 MED ORDER — HEPARIN SOD (PORK) LOCK FLUSH 100 UNIT/ML IV SOLN
500.0000 [IU] | Freq: Once | INTRAVENOUS | Status: AC | PRN
  Administered 2022-01-15: 500 [IU]

## 2022-01-15 MED ORDER — SODIUM CHLORIDE 0.9 % IV SOLN
650.0000 mg/m2 | Freq: Once | INTRAVENOUS | Status: AC
  Administered 2022-01-15: 1102 mg via INTRAVENOUS
  Filled 2022-01-15: qty 28.98

## 2022-01-15 MED ORDER — SODIUM CHLORIDE 0.9% FLUSH
10.0000 mL | Freq: Once | INTRAVENOUS | Status: AC
  Administered 2022-01-15: 10 mL

## 2022-01-15 MED ORDER — SODIUM CHLORIDE 0.9 % IV SOLN
131.9000 mg | Freq: Once | INTRAVENOUS | Status: AC
  Administered 2022-01-15: 130 mg via INTRAVENOUS
  Filled 2022-01-15: qty 13

## 2022-01-15 MED ORDER — SODIUM CHLORIDE 0.9% FLUSH
10.0000 mL | INTRAVENOUS | Status: DC | PRN
  Administered 2022-01-15: 10 mL

## 2022-01-15 MED ORDER — PEGFILGRASTIM 6 MG/0.6ML ~~LOC~~ PSKT
6.0000 mg | PREFILLED_SYRINGE | Freq: Once | SUBCUTANEOUS | Status: AC
  Administered 2022-01-15: 6 mg via SUBCUTANEOUS
  Filled 2022-01-15: qty 0.6

## 2022-01-15 MED ORDER — PALONOSETRON HCL INJECTION 0.25 MG/5ML
0.2500 mg | Freq: Once | INTRAVENOUS | Status: AC
  Administered 2022-01-15: 0.25 mg via INTRAVENOUS
  Filled 2022-01-15: qty 5

## 2022-01-15 MED ORDER — SODIUM CHLORIDE 0.9 % IV SOLN: Freq: Once | INTRAVENOUS | Status: AC

## 2022-01-15 MED ORDER — SODIUM CHLORIDE 0.9 % IV SOLN
10.0000 mg | Freq: Once | INTRAVENOUS | Status: AC
  Administered 2022-01-15: 10 mg via INTRAVENOUS
  Filled 2022-01-15: qty 10

## 2022-01-15 NOTE — Progress Notes (Signed)
[facility] Spiritual Care Note  Followed up with Ellen [name] in infusion. She reports the end of a significant relationship and consequent move to her sister's house, which has led to meaningful time together. Ellen [name] is maintaining a relationship with her counselor and looking to transfer her gym membership so that she can keep movement (swimming) as a regular part of her life. She is also working on regular discernment about what activity level is right for her relative to treatment and energy.  Provided empathic listening, emotional support, and affirmation of strengths. Ellen [name] is aware of ongoing chaplain availability, and we plan to follow up at a future treatment.   [place], [place], [facility] Pager [phone] Voicemail [phone]

## 2022-01-15 NOTE — Progress Notes (Signed)
Per Dr. Lindi Adie ok to proceed with tx with ALT 160 and PLT 82

## 2022-01-15 NOTE — Assessment & Plan Note (Signed)
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, FISH positive ratio 2.8, Ki-67 25%, lymph node positive   Palliative radiation completed 09/03/2020 Taxotere/Herceptin/Perjeta 08/31/2020-12/18/2020 Herceptin Perjeta Maintenance  beginning 01/08/2021-08/27/2021 Enhertu: 09/17/2021-10/29/2021: Discontinued for progression     PET/CT 07/07/2021: Interval decrease in the left breast mass having an SUV of 2.7, small nodule left breast SUV 5.6, left axillary lymph node SUV 3.35, liver metastases no longer visible.  Sclerosis involving bone metastases.  New compression fracture L3 vertebral spine no sclerotic   Biopsy of the breast at [facility] on 08/26/2021: IDC with lobular features ER 90%, PR 0%, HER2 1+ (low) ------------------------------------------------------------------------------------------------------------------------------------ Current treatment: Carboplatin and gemcitabine, today cycle 3-day 8  Toxicities:  1.  Pancytopenia:    2.  Palpitations: Much improved 3.  Thrombocytopenia:    She will get Neulasta on pro injection with the day 8 chemo. We will obtain CT chest abdomen pelvis and see her back along with her fourth cycle to discuss results.

## 2022-01-15 NOTE — Patient Instructions (Signed)
[facility]  Discharge Instructions: Thank you for choosing [facility] to provide your oncology and hematology care.   If you have a lab appointment with the [facility], please go directly to the [facility] and check in at the registration area.   Wear comfortable clothing and clothing appropriate for easy access to any Portacath or PICC line.   We strive to give you quality time with your provider. You may need to reschedule your appointment if you arrive late (15 or more minutes).  Arriving late affects you and other patients whose appointments are after yours.  Also, if you miss three or more appointments without notifying the office, you may be dismissed from the clinic at the provider's discretion.      For prescription refill requests, have your pharmacy contact our office and allow 72 hours for refills to be completed.    Today you received the following chemotherapy and/or immunotherapy agents: Gemcitabine and Carboplatin.   To help prevent nausea and vomiting after your treatment, we encourage you to take your nausea medication as directed.  BELOW ARE SYMPTOMS THAT SHOULD BE REPORTED IMMEDIATELY: *FEVER GREATER THAN 100.4 F (38 C) OR HIGHER *CHILLS OR SWEATING *NAUSEA AND VOMITING THAT IS NOT CONTROLLED WITH YOUR NAUSEA MEDICATION *UNUSUAL SHORTNESS OF BREATH *UNUSUAL BRUISING OR BLEEDING *URINARY PROBLEMS (pain or burning when urinating, or frequent urination) *BOWEL PROBLEMS (unusual diarrhea, constipation, pain near the anus) TENDERNESS IN MOUTH AND THROAT WITH OR WITHOUT PRESENCE OF ULCERS (sore throat, sores in mouth, or a toothache) UNUSUAL RASH, SWELLING OR PAIN  UNUSUAL VAGINAL DISCHARGE OR ITCHING   Items with * indicate a potential emergency and should be followed up as soon as possible or go to the Emergency Department if any problems should occur.  Please show the CHEMOTHERAPY ALERT CARD or IMMUNOTHERAPY ALERT CARD  at check-in to the Emergency Department and triage nurse.  Should you have questions after your visit or need to cancel or reschedule your appointment, please contact [facility]  Dept: [phone]  and follow the prompts.  Office hours are 8:00 a.m. to 4:30 p.m. Monday - Friday. Please note that voicemails left after 4:00 p.m. may not be returned until the following business day.  We are closed weekends and major holidays. You have access to a nurse at all times for urgent questions. Please call the main number to the clinic Dept: [phone] and follow the prompts.   For any non-urgent questions, you may also contact your provider using MyChart. We now offer e-Visits for anyone 65 and older to request care online for non-urgent symptoms. For details visit [email or web address].   Also download the MyChart app! Go to the app store, search "MyChart", open the app, select [facility], and log in with your MyChart username and password.  Masks are optional in the cancer centers. If you would like for your care team to wear a mask while they are taking care of you, please let them know. You may have one support person who is at least 27 years old accompany you for your appointments.

## 2022-01-17 ENCOUNTER — Encounter: Payer: Self-pay | Admitting: Adult Health

## 2022-01-20 ENCOUNTER — Encounter: Payer: Self-pay | Admitting: Hematology and Oncology

## 2022-01-27 ENCOUNTER — Ambulatory Visit (HOSPITAL_COMMUNITY)
Admission: RE | Admit: 2022-01-27 | Discharge: 2022-01-27 | Disposition: A | Discharge status: Home / Self Care | Payer: Medicaid Other | Source: Ambulatory Visit | Attending: Hematology and Oncology | Admitting: Hematology and Oncology

## 2022-01-27 DIAGNOSIS — C50919 Malignant neoplasm of unspecified site of unspecified female breast: Secondary | ICD-10-CM | POA: Diagnosis present

## 2022-01-27 DIAGNOSIS — C50912 Malignant neoplasm of unspecified site of left female breast: Secondary | ICD-10-CM | POA: Diagnosis not present

## 2022-01-27 DIAGNOSIS — C7951 Secondary malignant neoplasm of bone: Secondary | ICD-10-CM | POA: Insufficient documentation

## 2022-01-27 MED ORDER — IOHEXOL 300 MG/ML  SOLN
100.0000 mL | Freq: Once | INTRAMUSCULAR | Status: AC | PRN
  Administered 2022-01-27: 100 mL via INTRAVENOUS

## 2022-01-29 MED FILL — Dexamethasone Sodium Phosphate Inj 100 MG/10ML: INTRAMUSCULAR | Qty: 1 | Status: AC

## 2022-01-30 ENCOUNTER — Inpatient Hospital Stay (HOSPITAL_BASED_OUTPATIENT_CLINIC_OR_DEPARTMENT_OTHER): Payer: Medicaid Other | Admitting: Adult Health

## 2022-01-30 ENCOUNTER — Inpatient Hospital Stay: Payer: Medicaid Other | Attending: Adult Health

## 2022-01-30 ENCOUNTER — Encounter: Payer: Self-pay | Admitting: Adult Health

## 2022-01-30 ENCOUNTER — Telehealth: Payer: Self-pay | Admitting: Urology

## 2022-01-30 ENCOUNTER — Inpatient Hospital Stay: Payer: Medicaid Other

## 2022-01-30 VITALS — BP 111/86 | HR 93 | Temp 98.3°F | Resp 16 | Wt 144.7 lb

## 2022-01-30 DIAGNOSIS — C50919 Malignant neoplasm of unspecified site of unspecified female breast: Secondary | ICD-10-CM | POA: Insufficient documentation

## 2022-01-30 DIAGNOSIS — C787 Secondary malignant neoplasm of liver and intrahepatic bile duct: Secondary | ICD-10-CM | POA: Insufficient documentation

## 2022-01-30 DIAGNOSIS — C7951 Secondary malignant neoplasm of bone: Secondary | ICD-10-CM | POA: Diagnosis not present

## 2022-01-30 DIAGNOSIS — K219 Gastro-esophageal reflux disease without esophagitis: Secondary | ICD-10-CM | POA: Diagnosis not present

## 2022-01-30 DIAGNOSIS — C50912 Malignant neoplasm of unspecified site of left female breast: Secondary | ICD-10-CM

## 2022-01-30 DIAGNOSIS — Z95828 Presence of other vascular implants and grafts: Secondary | ICD-10-CM

## 2022-01-30 DIAGNOSIS — Z800 Family history of malignant neoplasm of digestive organs: Secondary | ICD-10-CM | POA: Insufficient documentation

## 2022-01-30 DIAGNOSIS — Z5111 Encounter for antineoplastic chemotherapy: Secondary | ICD-10-CM | POA: Diagnosis present

## 2022-01-30 DIAGNOSIS — J4520 Mild intermittent asthma, uncomplicated: Secondary | ICD-10-CM | POA: Insufficient documentation

## 2022-01-30 DIAGNOSIS — F17290 Nicotine dependence, other tobacco product, uncomplicated: Secondary | ICD-10-CM | POA: Diagnosis not present

## 2022-01-30 DIAGNOSIS — Z923 Personal history of irradiation: Secondary | ICD-10-CM | POA: Diagnosis not present

## 2022-01-30 DIAGNOSIS — Z9221 Personal history of antineoplastic chemotherapy: Secondary | ICD-10-CM | POA: Diagnosis not present

## 2022-01-30 LAB — CBC WITH DIFFERENTIAL (CANCER CENTER ONLY)
Abs Immature Granulocytes: 0.04 10*3/uL (ref 0.00–0.07)
Basophils Absolute: 0 10*3/uL (ref 0.0–0.1)
Basophils Relative: 0 %
Eosinophils Absolute: 0.1 10*3/uL (ref 0.0–0.5)
Eosinophils Relative: 1 %
HCT: 28.8 % — ABNORMAL LOW (ref 36.0–46.0)
Hemoglobin: 9.7 g/dL — ABNORMAL LOW (ref 12.0–15.0)
Immature Granulocytes: 1 %
Lymphocytes Relative: 14 %
Lymphs Abs: 1.1 10*3/uL (ref 0.7–4.0)
MCH: 33.2 pg (ref 26.0–34.0)
MCHC: 33.7 g/dL (ref 30.0–36.0)
MCV: 98.6 fL (ref 80.0–100.0)
Monocytes Absolute: 0.7 10*3/uL (ref 0.1–1.0)
Monocytes Relative: 8 %
Neutro Abs: 6.5 10*3/uL (ref 1.7–7.7)
Neutrophils Relative %: 76 %
Platelet Count: 155 10*3/uL (ref 150–400)
RBC: 2.92 MIL/uL — ABNORMAL LOW (ref 3.87–5.11)
RDW: 19.8 % — ABNORMAL HIGH (ref 11.5–15.5)
WBC Count: 8.4 10*3/uL (ref 4.0–10.5)
nRBC: 0.2 % (ref 0.0–0.2)

## 2022-01-30 LAB — CMP (CANCER CENTER ONLY)
ALT: 43 U/L (ref 0–44)
AST: 28 U/L (ref 15–41)
Albumin: 4.2 g/dL (ref 3.5–5.0)
Alkaline Phosphatase: 111 U/L (ref 38–126)
Anion gap: 6 (ref 5–15)
BUN: 8 mg/dL (ref 6–20)
CO2: 25 mmol/L (ref 22–32)
Calcium: 9.1 mg/dL (ref 8.9–10.3)
Chloride: 108 mmol/L (ref 98–111)
Creatinine: 0.61 mg/dL (ref 0.44–1.00)
GFR, Estimated: >60 mL/min (ref >60)
Glucose, Bld: 103 mg/dL — ABNORMAL HIGH (ref 70–99)
Potassium: 4 mmol/L (ref 3.5–5.1)
Sodium: 139 mmol/L (ref 135–145)
Total Bilirubin: 0.3 mg/dL (ref 0.3–1.2)
Total Protein: 6.6 g/dL (ref 6.5–8.1)

## 2022-01-30 MED ORDER — SODIUM CHLORIDE 0.9% FLUSH
10.0000 mL | Freq: Once | INTRAVENOUS | Status: AC
  Administered 2022-01-30: 10 mL

## 2022-01-30 MED ORDER — HEPARIN SOD (PORK) LOCK FLUSH 100 UNIT/ML IV SOLN
500.0000 [IU] | Freq: Once | INTRAVENOUS | Status: AC
  Administered 2022-01-30: 500 [IU]

## 2022-01-30 MED ORDER — GABAPENTIN 100 MG PO CAPS: 100.0000 mg | ORAL_CAPSULE | ORAL | 0 refills | Status: DC

## 2022-01-30 NOTE — Patient Instructions (Addendum)
Sacituzumab Govitecan Injection What is this medication? SACITUZUMAB GOVITECAN (SAK i TOOZ ue mab GOE vi TEE kan) treats breast cancer. It may also be used to treat bladder cancer and kidney cancer. It works by blocking a protein that causes cancer cells to grow and multiply. This helps to slow or stop the spread of cancer cells. This medicine may be used for other purposes; ask your health care provider or pharmacist if you have questions. COMMON BRAND NAME(S): TRODELVY What should I tell my care team before I take this medication? They need to know if you have any of these conditions: Carry the UGT1A1*28 gene Infection Liver disease An unusual or allergic reaction to sacituzumab govitecan, other medications, foods, dyes, or preservatives Pregnant or trying to get pregnant Breast-feeding How should I use this medication? This medication is injected into a vein. It is given by your care team in a hospital or clinic setting. Talk to your care team about the use of this medication in children. Special care may be needed. Overdosage: If you think you have taken too much of this medicine contact a poison control center or emergency room at once. NOTE: This medicine is only for you. Do not share this medicine with others. What if I miss a dose? Keep appointments for follow-up doses. It is important not to miss your dose. Call your care team if you are unable to keep an appointment. What may interact with this medication? This medication may affect how other medications work, and other medications may affect the way this medication works. Talk with your care team about all of the medications you take. They may suggest changes to your treatment plan to lower the risk of side effects and to make sure your medications work as intended. This list may not describe all possible interactions. Give your health care provider a list of all the medicines, herbs, non-prescription drugs, or dietary supplements  you use. Also tell them if you smoke, drink alcohol, or use illegal drugs. Some items may interact with your medicine. What should I watch for while using this medication? This medication may make you feel generally unwell. This is not uncommon as chemotherapy can affect healthy cells as well as cancer cells. Report any side effects. Continue your course of treatment even though you feel ill unless your care team tells you to stop. You may need blood work while you are taking this medication. Certain genetic factors may decrease the safety of this medication. Your care team may use genetic tests to determine treatment. This medication can cause serious allergic reactions. To reduce your risk, your care team may give you other medications to take before receiving this one. Be sure to follow the directions from your care team. Check with your care team if you have severe diarrhea, nausea, and vomiting, or if you sweat a lot. The loss of too much body fluid may make it dangerous for you to take this medication. Talk to your care team if you wish to become pregnant or think you might be pregnant. This medication can cause serious birth defects if taken during pregnancy or if you get pregnant within 6 months after stopping treatment. A negative pregnancy test is required before starting this medication. A reliable form of contraception is recommended while taking this medication and for 6 months after stopping treatment. Talk to your care team about reliable forms of contraception. Use a condom during sex and for 3 months after stopping treatment. Tell your care team right  away if you think your partner might be pregnant. This medication can cause serious birth defects. Do not breast-feed while taking this medication and for 1 month after stopping therapy. This medication may cause infertility. Talk to your care team if you are concerned about your fertility. This medication may increase your risk of getting  an infection. Call your care team for advice if you get a fever, chills, sore throat, or other symptoms of a cold or flu. Do not treat yourself. Try to avoid being around people who are sick. Avoid taking medications that contain aspirin, acetaminophen, ibuprofen, naproxen, or ketoprofen unless instructed by your care team. These medications may hide a fever. This medication may increase blood sugar. The risk may be higher in patients who already have diabetes. Ask your care team what you can do to lower your risk of diabetes while taking this medication. What side effects may I notice from receiving this medication? Side effects that you should report to your care team as soon as possible: Allergic reactions--skin rash, itching, hives, swelling of the face, lips, tongue, or throat Infection--fever, chills, cough, or sore throat Infusion reactions--chest pain, shortness of breath or trouble breathing, feeling faint or lightheaded Low red blood cell level--unusual weakness or fatigue, dizziness, headache, trouble breathing Severe or prolonged diarrhea Side effects that usually do not require medical attention (report these to your care team if they continue or are bothersome): Constipation Diarrhea Fatigue Hair loss Loss of appetite Nausea Vomiting This list may not describe all possible side effects. Call your doctor for medical advice about side effects. You may report side effects to FDA at 1-800-FDA-1088. Where should I keep my medication? This medication is given in a hospital or clinic. It will not be stored at home. NOTE: This sheet is a summary. It may not cover all possible information. If you have questions about this medicine, talk to your doctor, pharmacist, or health care provider.  2023 Else[employer]/Gold Standard (2021-04-04 00:00:00) Eribulin Injection What is this medication? ERIBULIN (er e bu lin) treats breast cancer. It may also be used to treat sarcoma, a cancer that occurs  in bone and connective tissues, such as fat, muscle, and blood vessels. It works by slowing down the growth of cancer cells. This medicine may be used for other purposes; ask your health care provider or pharmacist if you have questions. COMMON BRAND NAME(S): Halaven What should I tell my care team before I take this medication? They need to know if you have any of these conditions: Heart failure Irregular heartbeat or rhythm Kidney disease Liver disease Low levels of potassium or magnesium in the blood Tingling of the fingers or toes or other nerve disorder An unusual or allergic reaction to eribulin, other medications, foods, dyes, or preservatives Pregnant or trying to get pregnant Breast-feeding How should I use this medication? This medication is injected into a vein. It is given by your care team in a hospital or clinic setting. Talk to your care team regarding the use of this medication in children. Special care may be needed. Overdosage: If you think you have taken too much of this medicine contact a poison control center or emergency room at once. NOTE: This medicine is only for you. Do not share this medicine with others. What if I miss a dose? Keep appointments for follow-up doses. It is important not to miss your dose. Call your care team if you are unable to keep an appointment. What may interact with this medication? Do  not take this medication with any of the following: Cisapride Dronedarone Ketoconazole Levoketoconazole Pimozide Thioridazine This medication may also interact with the following: Other medications that cause heart rhythm changes This list may not describe all possible interactions. Give your health care provider a list of all the medicines, herbs, non-prescription drugs, or dietary supplements you use. Also tell them if you smoke, drink alcohol, or use illegal drugs. Some items may interact with your medicine. What should I watch for while using this  medication? This medication may make you feel generally unwell. This is not uncommon, as chemotherapy can affect healthy cells as well as cancer cells. Report any side effects. Continue your course of treatment even though you feel ill unless your care team tells you to stop. This medication may increase your risk of getting an infection. Call your care team for advice if you get a fever, chills, sore throat, or other symptoms of a cold or flu. Do not treat yourself. Try to avoid being around people who are sick. Avoid taking medications that contain aspirin, acetaminophen, ibuprofen, naproxen, or ketoprofen unless instructed by your care team. These medications may hide a fever. Be careful brushing or flossing your teeth or using a toothpick because you may get an infection or bleed more easily. If you have any dental work done, tell your dentist you are receiving this medication. You may need blood work while you are taking this medicine. Talk to your care team if you wish to become pregnant or think you might be pregnant. This medication can cause serious birth defects. A reliable form of contraception is recommended while taking this medication and for 2 weeks after stopping therapy. Talk to your care team about reliable forms of contraception. Do not father a child while taking this medication and for 3 and 1/2 months (14 weeks) after stopping therapy. Tell your care team right away if you think you or your partner might be pregnant. Do not breast-feed while taking this medication and for 2 weeks after stopping therapy. This medication may cause infertility. Talk to your care team if you are concerned about your fertility. What side effects may I notice from receiving this medication? Side effects that you should report to your care team as soon as possible: Allergic reactions--skin rash, itching, hives, swelling of the face, lips, tongue, or throat Heart rhythm changes--fast or irregular  heartbeat, dizziness, feeling faint or lightheaded, chest pain, trouble breathing Infection--fever, chills, cough, sore throat, wounds that don't heal, pain or trouble when passing urine, general feeling of discomfort or being unwell Pain, tingling, or numbness in the hands or feet Unusual bruising or bleeding Side effects that usually do not require medical attention (report to your care team if they continue or are bothersome): Constipation Fatigue Hair loss Headache Nausea Stomach pain This list may not describe all possible side effects. Call your doctor for medical advice about side effects. You may report side effects to FDA at 1-800-FDA-1088. Where should I keep my medication? This medication is given in a hospital or clinic and will not be stored at home. NOTE: This sheet is a summary. It may not cover all possible information. If you have questions about this medicine, talk to your doctor, pharmacist, or health care provider.  2023 [employer] Standard (2021-05-10 00:00:00) Alpelisib Tablets What is this medication? ALPELISIB (AL pe LIS ib) treats breast cancer. It works by blocking a protein that causes cancer cells to grow and multiply. This helps to slow or stop the  spread of cancer cells. This medicine may be used for other purposes; ask your health care provider or pharmacist if you have questions. COMMON BRAND NAME(S): PIQRAY What should I tell my care team before I take this medication? They need to know if you have any of these conditions: Diabetes Lung disease An unusual or allergic reaction to alpelisib, other medications, foods, dyes, or preservatives If you or your partner are pregnant or trying to get pregnant Breast-feeding How should I use this medication? Take this medication by mouth. Take it as directed on the prescription label at the same time every day. Do not cut, crush, or chew this medication. Swallow the tablets whole. Take it with food. Keep taking  it unless your care team tells you to stop. Talk to your care team about the use of this medication in children. Special care may be needed. Overdosage: If you think you have taken too much of this medicine contact a poison control center or emergency room at once. NOTE: This medicine is only for you. Do not share this medicine with others. What if I miss a dose? If you miss a dose, take it as soon as you can unless it is more than 9 hours late. If it is more than 9 hours late, skip the missed dose. Take the next dose at the normal time. If you vomit a dose, skip it. Take your next dose at the normal time. Do not take extra or 2 doses at the same time to make up for the missed dose. What may interact with this medication? Certain medications for seizures, such as carbamazepine, phenobarbital, phenytoin Rifampin St. John's wort Warfarin This list may not describe all possible interactions. Give your health care provider a list of all the medicines, herbs, non-prescription drugs, or dietary supplements you use. Also tell them if you smoke, drink alcohol, or use illegal drugs. Some items may interact with your medicine. What should I watch for while using this medication? This medication may make you feel generally unwell. This is not uncommon as chemotherapy can affect healthy cells as well as cancer cells. Report any side effects. Continue your course of treatment even though you feel ill unless your care team tells you to stop. You may need blood work while taking this medication. This medication may cause serious skin reactions. They can happen weeks to months after starting the medication. Contact your care team right away if you notice fevers or flu-like symptoms with a rash. The rash may be red or purple and then turn into blisters or peeling of the skin. You may also notice a red rash with swelling of the face, lips, or lymph nodes in your neck or under your arms. Check with your care team if  you have severe diarrhea, nausea, and vomiting, or if you sweat a lot. The loss of too much body fluid may make it dangerous for you to take this medication. This medication may increase your risk of getting an infection. Call your care team for advice if you get a fever, chills, sore throat, or other symptoms of a cold or flu. Do not treat yourself. Try to avoid being around people who are sick. Avoid taking medications that contain aspirin, acetaminophen, ibuprofen, naproxen, or ketoprofen unless instructed by your care team. These medications may hide a fever. Be careful brushing or flossing your teeth or using a toothpick because you may get an infection or bleed more easily. If you have any dental work done,  tell your dentist you are receiving this medication. This medication may increase blood sugar. The risk may be higher in patients who already have diabetes. Ask your care team what you can do to lower your risk of diabetes while taking this medication. Ask your care team if changes in diet or medications are needed if you have diabetes. Talk to your care team if you or your partner may be pregnant. Serious birth defects can occur if you take this medication during pregnancy and for 1 week after the last dose. You will need a negative pregnancy test before starting this medication. Contraception is recommended while taking this medication and for 1 week after the last dose. Your care team can help you find the option that works for you. If your partner can get pregnant, use a condom during sex while taking this medication and for 1 week after the last dose. Do not breastfeed while taking this medication and for 1 week after the last dose. This medication may cause infertility. Talk with your care team if you are concerned about your fertility. What side effects may I notice from receiving this medication? Side effects that you should report to your care team as soon as possible: Allergic reactions  or angioedema--skin rash, itching or hives, swelling of the face, eyes, lips, tongue, arms, or legs, trouble swallowing or breathing Dry cough, shortness of breath or trouble breathing High blood sugar (hyperglycemia)--increased thirst or amount of urine, unusual weakness or fatigue, blurry vision Rash, fever, and swollen lymph nodes Redness, blistering, peeling, or loosening of the skin, including inside the mouth Severe or prolonged diarrhea Sudden or severe stomach pain, bloody diarrhea, fever, nausea, vomiting Side effects that usually do not require medical attention (report these to your care team if they continue or are bothersome): Fatigue Hair loss Loss of appetite with weight loss Nausea Pain, redness, or swelling with sores inside the mouth or throat Stomach pain This list may not describe all possible side effects. Call your doctor for medical advice about side effects. You may report side effects to FDA at 1-800-FDA-1088. Where should I keep my medication? Keep out of the reach of children and pets. Store between 20 and 25 degrees C (68 and 77 degrees F). Get rid of any unused medication after the expiration date. To get rid of medications that are no longer needed or have expired: Take the medication to a medication take-back program. Check with your pharmacy or law enforcement to find a location. If you cannot return the medication, check the label or package insert to see if the medication should be thrown out in the garbage or flushed down the toilet. If you are not sure, ask your care team. If it is safe to put in the trash, take the medication out of the container. Mix the medication with cat litter, dirt, coffee grounds, or other unwanted substance. Seal the mixture in a bag or container. Put it in the trash. NOTE: This sheet is a summary. It may not cover all possible information. If you have questions about this medicine, talk to your doctor, pharmacist, or health care  provider.  2023 Else[employer]/Gold Standard (2021-06-21 00:00:00) Fulvestrant Injection What is this medication? FULVESTRANT (ful VES trant) treats breast cancer. It works by blocking the hormone estrogen in breast tissue, which prevents breast cancer cells from spreading or growing. This medicine may be used for other purposes; ask your health care provider or pharmacist if you have questions. COMMON BRAND NAME(S): FASLODEX What  should I tell my care team before I take this medication? They need to know if you have any of these conditions: Bleeding disorder Liver disease Low blood cell levels, such as low white cells, red cells, and platelets An unusual or allergic reaction to fulvestrant, other medications, foods, dyes, or preservatives Pregnant or trying to get pregnant Breast-feeding How should I use this medication? This medication is injected into a muscle. It is given by your care team in a hospital or clinic setting. Talk to your care team about the use of this medication in children. Special care may be needed. Overdosage: If you think you have taken too much of this medicine contact a poison control center or emergency room at once. NOTE: This medicine is only for you. Do not share this medicine with others. What if I miss a dose? Keep appointments for follow-up doses. It is important not to miss your dose. Call your care team if you are unable to keep an appointment. What may interact with this medication? Certain medications that prevent or treat blood clots, such as warfarin, enoxaparin, dalteparin, apixaban, dabigatran, rivaroxaban This list may not describe all possible interactions. Give your health care provider a list of all the medicines, herbs, non-prescription drugs, or dietary supplements you use. Also tell them if you smoke, drink alcohol, or use illegal drugs. Some items may interact with your medicine. What should I watch for while using this medication? Your condition  will be monitored carefully while you are receiving this medication. You may need blood work while taking this medication. Talk to your care team if you may be pregnant. Serious birth defects can occur if you take this medication during pregnancy and for 1 year after the last dose. You will need a negative pregnancy test before starting this medication. Contraception is recommended while taking this medication and for 1 year after the last dose. Your care team can help you find the option that works for you. Do not breastfeed while taking this medication and for 1 year after the last dose. This medication may cause infertility. Talk to your care team if you are concerned about your fertility. What side effects may I notice from receiving this medication? Side effects that you should report to your care team as soon as possible: Allergic reactions or angioedema--skin rash, itching or hives, swelling of the face, eyes, lips, tongue, arms, or legs, trouble swallowing or breathing Pain, tingling, or numbness in the hands or feet Side effects that usually do not require medical attention (report to your care team if they continue or are bothersome): Bone, joint, or muscle pain Constipation Headache Hot flashes Nausea Pain, redness, or irritation at injection site Unusual weakness or fatigue This list may not describe all possible side effects. Call your doctor for medical advice about side effects. You may report side effects to FDA at 1-800-FDA-1088. Where should I keep my medication? This medication is given in a hospital or clinic. It will not be stored at home. NOTE: This sheet is a summary. It may not cover all possible information. If you have questions about this medicine, talk to your doctor, pharmacist, or health care provider.  2023 [employer]/Gold Standard (2021-06-17 00:00:00)
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
## 2022-01-30 NOTE — Telephone Encounter (Signed)
Diagnosis:   26 yo woman with stage IV breast cancer with painful left beast mass, invading into the left chest wall      Interval Since Last Radiation:  1 year, 5 months 08/21/20 - 09/04/20:   The sites of painful metastases in the lumbar spine and right pelvis were treated to 30 Gy in 10 fractions of 3 Gy each.  Today, we were contacted by Wilber Bihari, NP regarding patient complaints of some increased pain in the left breast and left chest wall. We had a chance to review her most recent imaging and unfortunately, this does show disease progression, namely in the left breast, invading into the left chest wall. I called and spoke with her by telephone and she confirmed that this is indeed the area of discomfort. She reports that the growth of the tumor in the left breast has increased so much that she now has full retraction of the left nipple which is extremely uncomfortable. She also describes what sounds like  occasional neuralgias in the left breast with sharp, shooting pains behind and around the left nipple, intermittently. She had a very good response to previous palliative radiation which she tolerated well and had complete resolution of the back and pelvic pain.    Plan:  Dr. Lindi Adie is going to change her systemic therapy but this will not start until after Christmas so in the meantime, we have offered a 2 week course of daily, palliative radiation to the left breast and left chest wall. She is in agreement to proceed so we have tentatively scheduled her for CT SIM/treatment planning at 8am on Friday 01/31/22, in anticipation of beginning her treatments early next week in an effort to complete the radiation treatments to avoid any delays in starting the new systemic therapy. We will share our discussion with Dr. Lindi Adie and Mendel Ryder and proceed with treatment planning accordingly.   Nicholos Johns, PA-C    [name], MD  [facility] Direct Dial: [phone]  Fax:  [phone] [email or web address]  Skype  LinkedIn

## 2022-01-31 ENCOUNTER — Other Ambulatory Visit: Payer: Self-pay

## 2022-01-31 ENCOUNTER — Encounter: Payer: Self-pay | Admitting: Hematology and Oncology

## 2022-01-31 ENCOUNTER — Encounter: Payer: Self-pay | Admitting: Adult Health

## 2022-01-31 ENCOUNTER — Ambulatory Visit
Admission: RE | Admit: 2022-01-31 | Discharge: 2022-01-31 | Disposition: A | Discharge status: Home / Self Care | Payer: Medicaid Other | Source: Ambulatory Visit | Attending: Radiation Oncology | Admitting: Radiation Oncology

## 2022-01-31 DIAGNOSIS — Z510 Encounter for antineoplastic radiation therapy: Secondary | ICD-10-CM | POA: Insufficient documentation

## 2022-01-31 DIAGNOSIS — C50919 Malignant neoplasm of unspecified site of unspecified female breast: Secondary | ICD-10-CM

## 2022-01-31 DIAGNOSIS — C50312 Malignant neoplasm of lower-inner quadrant of left female breast: Secondary | ICD-10-CM | POA: Diagnosis not present

## 2022-01-31 DIAGNOSIS — C7951 Secondary malignant neoplasm of bone: Secondary | ICD-10-CM | POA: Insufficient documentation

## 2022-01-31 MED ORDER — RADIAPLEXRX EX GEL: Freq: Once | CUTANEOUS | Status: AC

## 2022-01-31 NOTE — Addendum Note (Signed)
Encounter addended by: Cori Razor, RN on: 01/31/2022 4:19 PM  Actions taken: Order list changed, Diagnosis association updated, MAR administration accepted

## 2022-01-31 NOTE — Assessment & Plan Note (Addendum)
Ellen [name] is here today for follow-up and discussion of her scan results.  Unfortunately her CT chest abdomen pelvis indicate progression in her primary breast cancer along with new areas of concern in the lung.  I discontinued her current treatment plan with gemcitabine and carboplatin since it is no longer working.  She sees Dr. Wetzel Bjornstad virtually in 3 days for treatment options discussion and we will defer to her for the final treatment decision for [facility].  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  We discussed additional options which include Trodelvy, and Eribulin.    Since her appointment is in a few days with Dr. Wetzel Bjornstad, I went ahead and coordinated her to see Dr. Tammi Klippel and Freeman Caldron in radiation oncology to discuss radiation treatment to her breast/chest wall to help palliate the breast pain while we work out the logistics of her next treatment.  I also ordered low dose gabapentin for her to take for her neuropathic breast pain.  We discussed her prognosis, as she is well aware that she is running lower on treatment options.  I gave her a copy of our advanced directive paperwork to complete so we can notarize it and placed a referral to Alda Lea our NP in palliative care so Gissell can get established and talk to her about quality of life and goals of care.    Shaylen will f/u with me on Monday, 02/03/2022 in the afternoon to discuss her appt with Dr. Wetzel Bjornstad so we can plan for and order her next treatment.

## 2022-01-31 NOTE — Progress Notes (Signed)
  Radiation Oncology         [phone] ________________________________  Name: Ellen [name] MRN: [id number]  Date: 01/31/2022  DOB: 1995-09-27  SIMULATION AND TREATMENT PLANNING NOTE    ICD-10-CM   1. Primary malignant neoplasm of breast with metastasis ([place])  C50.919       DIAGNOSIS:   26 yo woman with stage IV breast cancer with painful left beast mass, invading into the left chest wall     NARRATIVE:  The patient was brought to the [facility].  Identity was confirmed.  All relevant records and images related to the planned course of therapy were reviewed.  The patient freely provided informed written consent to proceed with treatment after reviewing the details related to the planned course of therapy. The consent form was witnessed and verified by the simulation staff.  Then, the patient was set-up in a stable reproducible  supine position for radiation therapy.  CT images were obtained.  Surface markings were placed.  The CT images were loaded into the planning software.  Then the target and avoidance structures were contoured.  Treatment planning then occurred.  The radiation prescription was entered and confirmed.  Then, I designed and supervised the construction of a total of multiple medically necessary complex treatment devices including body positioner and MLCs to shield the heart and lungs.  I have requested : 3D Simulation  I have requested a DVH of the following structures: heart, both lungs and target.  PLAN:  The patient will receive 30 Gy in 10 fractions.  ________________________________  Sheral Apley Tammi Klippel, M.D.

## 2022-02-03 ENCOUNTER — Encounter: Payer: Self-pay | Admitting: General Practice

## 2022-02-03 ENCOUNTER — Inpatient Hospital Stay (HOSPITAL_BASED_OUTPATIENT_CLINIC_OR_DEPARTMENT_OTHER): Payer: Medicaid Other | Admitting: Adult Health

## 2022-02-03 DIAGNOSIS — C7951 Secondary malignant neoplasm of bone: Secondary | ICD-10-CM

## 2022-02-03 DIAGNOSIS — C50912 Malignant neoplasm of unspecified site of left female breast: Secondary | ICD-10-CM | POA: Diagnosis not present

## 2022-02-03 NOTE — Progress Notes (Signed)
[facility] Spiritual Care Note  Had planned to follow up in infusion, but see Ria Comment [name]/NP's note regarding change in treatment plan. Lebron Conners by phone for pastoral check-in and reminder of ongoing chaplain availability. She plans to reach out as needed/desired.   [place], [place], [facility] Pager [phone] Voicemail [phone]

## 2022-02-03 NOTE — Assessment & Plan Note (Signed)
Ellen [name] is a 26 year old woman with h/o metastatic breast cancer here today to discuss her recent visit with Dr. Wetzel Bjornstad at [facility] and next steps.  [name] plans on proceeding with evaluation and determination of her eligibility to participate int he InCITe clinical trial at [facility].  This appears to be a wonderful option for her and Dr. Lindi Adie and myself are in full support.  I reached out to her radiation oncology team to cancel her upcoming radiation therapy.  She will continue on Gabapentin for her breast pain for the time being.    [name] will call me once she has a better idea what the timing will be for the clinical trial.  She knows that we are happy to support her through the trial in any way we can.
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
## 2022-02-04 ENCOUNTER — Inpatient Hospital Stay: Payer: Medicaid Other | Admitting: Hematology and Oncology

## 2022-02-04 ENCOUNTER — Inpatient Hospital Stay: Payer: Medicaid Other

## 2022-02-04 ENCOUNTER — Ambulatory Visit: Payer: Medicaid Other

## 2022-02-04 DIAGNOSIS — Z510 Encounter for antineoplastic radiation therapy: Secondary | ICD-10-CM | POA: Diagnosis not present

## 2022-02-05 ENCOUNTER — Ambulatory Visit: Payer: Medicaid Other

## 2022-02-06 ENCOUNTER — Ambulatory Visit: Payer: Medicaid Other

## 2022-02-07 ENCOUNTER — Ambulatory Visit: Payer: Medicaid Other

## 2022-02-07 ENCOUNTER — Other Ambulatory Visit: Payer: Self-pay | Admitting: Hematology and Oncology

## 2022-02-07 DIAGNOSIS — C50919 Malignant neoplasm of unspecified site of unspecified female breast: Secondary | ICD-10-CM

## 2022-02-07 DIAGNOSIS — C50912 Malignant neoplasm of unspecified site of left female breast: Secondary | ICD-10-CM

## 2022-02-07 NOTE — Progress Notes (Signed)
Because of disease progression on carboplatin and gemcitabine, recommended Trodelvy.  This was also recommended by Dr. Wetzel Bjornstad.

## 2022-02-07 NOTE — Progress Notes (Signed)
DISCONTINUE OFF PATHWAY REGIMEN - Breast   [id number]:Carboplatin AUC=2 IV D1,8 + Gemcitabine 1,000 mg/m2 IV D1,8 q21 Days:   A cycle is every 21 days:     Carboplatin      Gemcitabine   **Always confirm dose/schedule in your pharmacy ordering system**  REASON: Disease Progression PRIOR TREATMENT: Off Pathway: Carboplatin AUC=2 IV D1,8 + Gemcitabine 1,000 mg/m2 IV D1,8 q21 Days TREATMENT RESPONSE: Unable to Evaluate  START ON PATHWAY REGIMEN - Breast     A cycle is every 21 days:     Sacituzumab govitecan-hziy   **Always confirm dose/schedule in your pharmacy ordering system**  Patient Characteristics: Distant Metastases or Locoregional Recurrent Disease - Unresected or Locally Advanced Unresectable Disease Progressing after Neoadjuvant and Local Therapies, HER2 Low/Negative, ER Negative, Chemotherapy, HER2 Low, Third Line and Beyond, [employer] or  Not a Candidate for Molecular Targeted Therapy Therapeutic Status: Distant Metastases HER2 Status: Low ER Status: Negative (-) PR Status: Negative (-) Therapy Approach Indicated: Standard Chemotherapy/Endocrine Therapy Line of Therapy: Third Line and Beyond Intent of Therapy: Non-Curative / Palliative Intent, Discussed with Patient

## 2022-02-09 ENCOUNTER — Other Ambulatory Visit: Payer: Self-pay

## 2022-02-11 ENCOUNTER — Ambulatory Visit: Payer: Medicaid Other

## 2022-02-11 ENCOUNTER — Other Ambulatory Visit: Payer: Medicaid Other

## 2022-02-11 NOTE — Progress Notes (Signed)
[place]  [phone] [phone]) [phone]   Name: Ellen [name] Date: 02/11/2022 MRN: [id number]  DOB: [birth year]  Patient Care Team: Pcp, No as PCP - General [name], MD as Consulting Physician (Hematology and Oncology) Delice Bison, [name], NP as Nurse Practitioner (Hematology and Oncology) [name], MD as Consulting Physician (General Surgery) [name], MD as Consulting Physician (Radiation Oncology) [name], [name], NP as Nurse Practitioner (Nurse Practitioner)    REASON FOR CONSULTATION: Ellen [name] is a 26 y.o. 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  Palliative ask to see for symptom management and goals of care.    SOCIAL HISTORY:     reports that she has never smoked. She has never used smokeless tobacco. She reports that she does not currently use alcohol. She reports current drug use. Drug: Marijuana.  ADVANCE DIRECTIVES:    CODE STATUS: Full code  PAST MEDICAL HISTORY: Past Medical History:  Diagnosis Date   ADHD (attention deficit hyperactivity disorder)    Anemia    Anxiety    Asthma    Exercise Induced   Cancer ([place])    Depression    Family history of pancreatic cancer    Family history of thyroid cancer    GERD (gastroesophageal reflux disease)    Herpes simplex    Personal history of chemotherapy 08/2020    PAST SURGICAL HISTORY:  Past Surgical History:  Procedure Laterality Date   BREAST BIOPSY Left 08/2020   PORTACATH PLACEMENT Right 08/23/2020   Procedure: INSERTION PORT-A-CATH;  Surgeon: [name], MD;  Location: [place];  Service: General;  Laterality: Right;   ROBOTIC ASSISTED BILATERAL SALPINGO OOPHERECTOMY Bilateral  07/16/2021   Procedure: XI ROBOTIC ASSISTED BILATERAL SALPINGO OOPHORECTOMY;  Surgeon: Lafonda Mosses, MD;  Location: [facility];  Service: Gynecology;  Laterality: Bilateral;    HEMATOLOGY/ONCOLOGY HISTORY:  Oncology History  Primary malignant neoplasm of breast with metastasis ([place])  08/15/2020 Imaging   Large mass in the medial left breast measuring 3.5 cm.  Prominent left axillary lymph node cluster of adjacent lymph nodes, irregular nodule right middle lobe 1.1 cm, aggressive lesion right second rib cortical destruction, multiple lucent lesions throughout the thoracic spine including T1 vertebral body and T2, irregular multilobar lesion central left hepatic lobe 4.3 x 4.7 cm.  Several satellite lesions in the left lateral hepatic lobe.  Multiple sclerotic lesions in the bones iliac wings, acetabular, medial iliac bones, sacrum multiple lesions lumbar spine L3-L4 and L5   08/17/2020 Initial Diagnosis   Biopsy revealed IDC with DCIS grade 2, ER 30%, PR 20%, HER2 equivocal by IHC, FISH positive, Ki-67 25%, lymph node positive    Genetic Testing   Negative genetic testing:  No pathogenic variants detected on the [employer] CancerNext-Expanded + RNAinsight panel. The report date is 09/06/2020.   The CancerNext-Expanded + RNAinsight gene panel offered by [employer] and includes sequencing and rearrangement analysis for the following 77 genes: AIP, ALK, APC, ATM, AXIN2, BAP1, BARD1, BLM, BMPR1A, BRCA1, BRCA2, BRIP1, CDC73, CDH1, CDK4, CDKN1B, CDKN2A, CHEK2, CTNNA1, DICER1, FANCC, FH, FLCN, GALNT12, KIF1B, LZTR1, MAX, MEN1, MET, MLH1, MSH2, MSH3, MSH6, MUTYH, NBN, NF1, NF2, NTHL1, PALB2, PHOX2B, PMS2, POT1, PRKAR1A, PTCH1, PTEN, RAD51C, RAD51D, RB1, RECQL, RET, SDHA, SDHAF2, SDHB, SDHC, SDHD, SMAD4, SMARCA4, SMARCB1, SMARCE1, STK11, SUFU,  TMEM127, TP53, TSC1, TSC2, VHL and XRCC2 (sequencing and deletion/duplication); EGFR, EGLN1, HOXB13, KIT, MITF, PDGFRA, POLD1 and POLE (sequencing only); EPCAM and GREM1  (deletion/duplication only). RNA data is routinely analyzed for use in variant interpretation for all genes.   09/17/2021 - 10/07/2021 Chemotherapy   Patient is on Treatment Plan : BREAST METASTATIC fam-trastuzumab deruxtecan-nxki (Enhertu) q21d     09/17/2021 - 10/29/2021 Chemotherapy   Patient is on Treatment Plan : BREAST METASTATIC Fam-Trastuzumab Deruxtecan-nxki (Enhertu) (5.4) q21d     11/19/2021 - 01/15/2022 Chemotherapy   Patient is on Treatment Plan : BREAST Carboplatin + Gemcitabine D1,8 q21d     02/14/2022 -  Chemotherapy   Patient is on Treatment Plan : BREAST METASTATIC Sacituzumab govitecan-hziy Ellen Loyal) D1,8 q21d     Carcinoma of breast metastatic to bone ([place])  08/17/2020 Initial Diagnosis   Carcinoma of breast metastatic to bone ([place]); Goserelin/Zoladex started and to be given every 4 weeks    Genetic Testing   Negative genetic testing:  No pathogenic variants detected on the [employer] CancerNext-Expanded + RNAinsight panel. The report date is 09/06/2020.   The CancerNext-Expanded + RNAinsight gene panel offered by [employer] and includes sequencing and rearrangement analysis for the following 77 genes: AIP, ALK, APC, ATM, AXIN2, BAP1, BARD1, BLM, BMPR1A, BRCA1, BRCA2, BRIP1, CDC73, CDH1, CDK4, CDKN1B, CDKN2A, CHEK2, CTNNA1, DICER1, FANCC, FH, FLCN, GALNT12, KIF1B, LZTR1, MAX, MEN1, MET, MLH1, MSH2, MSH3, MSH6, MUTYH, NBN, NF1, NF2, NTHL1, PALB2, PHOX2B, PMS2, POT1, PRKAR1A, PTCH1, PTEN, RAD51C, RAD51D, RB1, RECQL, RET, SDHA, SDHAF2, SDHB, SDHC, SDHD, SMAD4, SMARCA4, SMARCB1, SMARCE1, STK11, SUFU, TMEM127, TP53, TSC1, TSC2, VHL and XRCC2 (sequencing and deletion/duplication); EGFR, EGLN1, HOXB13, KIT, MITF, PDGFRA, POLD1 and POLE (sequencing only); EPCAM and GREM1 (deletion/duplication only). RNA data is routinely analyzed for use in variant interpretation for all genes.   08/18/2020 - 08/30/2020 Radiation Therapy   Palliative radiation to the lumbar and pelvic bone; 30 Gy in 10  fractions   08/31/2020 - 12/18/2020 Adjuvant Chemotherapy   Started with Herceptin/Perjeta on 08/31/2020 and Taxotere on 09/04/2020; she was hospitalized with dehydration and refractory diarrhea for 11 days, she resumed treatment with Taxotere (dose reduced by 72m/m2) and Herceptin only (perjeta discontinued) on 09/26/2020, reintroduced 10/17/2020   09/03/2020 Cancer Staging   Staging form: Breast, AJCC 8th Edition - Clinical stage from 09/03/2020: Stage IV (cT4b, cN1, cM1, G2, ER+, PR+, HER2+) - Signed by CGardenia Phlegm NP on 10/03/2020 Stage prefix: Initial diagnosis Method of lymph node assessment: Clinical Histologic grading system: 3 grade system   01/08/2021 - 08/27/2021 Adjuvant Chemotherapy   Herceptin/Perjeta every 3 weeks, Zometa every 12 weeks added 01/29/2021   07/07/2021 PET scan   1. Interval decrease in size of the dominant left breast mass which now exhibits mild low level FDG uptake with SUV max of 2.7. Small nodule within the left breast exhibits moderate tracer uptake within SUV max of 5.6 and is suspicious for residual/recurrent local tumor. 2. Mild tracer uptake associated with prominent left axillary lymph node with SUV max of 3.35. Residual tracer avid nodal metastasis not excluded. 3. The previously noted liver metastasis are no longer visible on the CT images from today's study. There is no abnormal increased uptake within the left hepatic lobe above background liver activity to suggest metabolically active tumor metastasis. 4. Interval increase in sclerosis associated with previously noted lytic bone metastasis without significant increased uptake above background bone marrow activity compatible with healing multifocal bone metastasis. 5. New pathologic fracture involves the L3  vertebral body where there was extensive lytic changes noted on the previous CT. The collapsed vertebral body is now sclerotic with mild low level FDG uptake. 6. Similar appearance  of soft tissue infiltration within the anterior mediastinal fat which exhibits mild FDG uptake on today's study. 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   07/16/2021 Surgery   S/p BSO, benign pathology    08/26/2021 Mammogram   Mammo diagnostic breast tomosynthesis left: There is an additional  irregular mass in the lower inner position at posterior depth. This was  further evaluated with ultrasound.   Mammo US breast limited left: There is a 0.7 cm x 0.5 cm x 0.7 cm  irregularly shaped, hypoechoic mass with indistinct margins seen in the  left breast at 8 o'clock, 8 cm from the nipple. This mass is 1.6 cm medial  to the palpable mass as detailed above.    08/26/2021 Pathology Results   New left breast mass 8:00 biopsy: Invasive carcinoma with mixed ductal and lobular and focal micropapillary features.  Grade 3, invasive carcinoma measures 9 mm with an associated lymphoid reaction.  ER negative PR negative HER2 1+.  Left breast mass 2:00 biopsy: Invasive carcinoma with mixed ductal and lobular features Nottingham grade 2 measuring 1.4 cm.  ER 9% (low positive), PR negative, HER2 1+.   09/16/2021 PET scan   1. Interval progression of hypermetabolic metastatic disease. 2. Hypermetabolic bilateral axillary lymphadenopathy, subcarinal and bilateral hilar lymphadenopathy, new/progressive. 3. New hypermetabolic nodularity in the lower left breast is compatible with progressive malignancy. 4. New hypermetabolic mixed lytic and sclerotic left pelvic bone metastases as detailed. 5. Persistent patchy hypermetabolism in the anterior mediastinum associated with mixed soft tissue and fat density, favor thymic hyperplasia.   09/17/2021 - 10/07/2021 Chemotherapy   Patient is on Treatment Plan : BREAST METASTATIC fam-trastuzumab deruxtecan-nxki (Enhertu) q21d     09/17/2021 - 10/29/2021 Chemotherapy   Patient is on Treatment Plan : BREAST METASTATIC Fam-Trastuzumab Deruxtecan-nxki  (Enhertu) (5.4) q21d     11/11/2021 PET scan    1. Hypermetabolic left breast soft tissue and skin thickening is increased  from prior examination and suspicious for worsening primary malignancy.   2. There is evidence of interval worsening hypermetabolic adenopathy.  Primary differential consideration is worsening nodal metastasis, however  given the distribution the possibility of sarcoid-like reaction in the  setting of therapy is raised. There is worsening hypermetabolic left  axillary adenopathy with new areas of hypermetabolic adenopathy seen in the  left subpectoral, internal mammary, cardiophrenic, right axillary,  mediastinal, and bilateral hilar nodal stations. A few nonspecific FDG avid  nonenlarged left pelvic/inguinal nodes are also seen. Tissue sampling  should be considered for definitive assessment, otherwise close follow-up  is recommended.   3. Interval increased hypermetabolic activity associated with osseous  metastasis in the left hemipelvis. Other nonavid sclerotic bone lesions may  reflect sites of treated disease.   Electronically Reviewed by:  Mahala Menghini, MD, [facility]  Electronically Reviewed on:  11/11/2021 6:13 PM    11/19/2021 - 01/15/2022 Chemotherapy   Patient is on Treatment Plan : BREAST Carboplatin + Gemcitabine D1,8 q21d     02/14/2022 -  Chemotherapy   Patient is on Treatment Plan : BREAST METASTATIC Sacituzumab govitecan-hziy Ellen Loyal) D1,8 q21d       ALLERGIES:  is allergic to other.  MEDICATIONS:  Current Outpatient Medications  Medication Sig Dispense Refill   acyclovir (ZOVIRAX) 800 MG tablet Take 1 tablet (800 mg total) by mouth 2 (two)  times daily as needed (flare up). 60 tablet 5   acyclovir ointment (ZOVIRAX) 5 % Apply 1 application. topically 4 (four) times daily as needed (outbreak).     albuterol (VENTOLIN HFA) 108 (90 Base) MCG/ACT inhaler Inhale 1 puff every 4 hours by inhalation route.     Calcium Carb-Cholecalciferol  500-10 MG-MCG TABS Take by mouth.     Cholecalciferol 50 MCG (2000 UT) CAPS Take by mouth.     gabapentin (NEURONTIN) 100 MG capsule Take 1 capsule (100 mg total) by mouth as directed. Take 100 mg three time a day, increase by 100 mg every 3 days to max of 300 mg three times a day 270 capsule 0   lidocaine-prilocaine (EMLA) cream      lisdexamfetamine (VYVANSE) 40 MG capsule Take 1 capsule by mouth daily.     LORazepam (ATIVAN) 0.5 MG tablet Take 1 tablet (0.5 mg total) by mouth every 8 (eight) hours as needed for anxiety. 30 tablet 1   magnesium oxide (MAG-OX) 400 (240 Mg) MG tablet Take 1 tablet (400 mg total) by mouth daily. 30 tablet 3   ondansetron (ZOFRAN) 8 MG tablet Take 1 tablet (8 mg total) by mouth every 8 (eight) hours as needed for nausea or vomiting. 20 tablet 0   pantoprazole (PROTONIX) 40 MG tablet Take 1 tablet (40 mg total) by mouth daily. 30 tablet 3   prochlorperazine (COMPAZINE) 10 MG tablet Take 1 tablet (10 mg total) by mouth every 6 (six) hours as needed for nausea or vomiting. 30 tablet 0   venlafaxine XR (EFFEXOR-XR) 150 MG 24 hr capsule Take 1 capsule (150 mg total) by mouth daily with breakfast. 30 capsule 6   No current facility-administered medications for this visit.    VITAL SIGNS: There were no vitals taken for this visit. There were no vitals filed for this visit.  Estimated body mass index is 24.08 kg/m as calculated from the following:   Height as of 01/15/22: _0  (1.651 m).   Weight as of 01/30/22: 144 lb 11.2 oz (65.6 kg).  LABS: CBC:    Component Value Date/Time   WBC 8.4 01/30/2022 0916   WBC 4.2 10/07/2021 0955   HGB 9.7 (L) 01/30/2022 0916   HCT 28.8 (L) 01/30/2022 0916   PLT 155 01/30/2022 0916   MCV 98.6 01/30/2022 0916   NEUTROABS 6.5 01/30/2022 0916   LYMPHSABS 1.1 01/30/2022 0916   MONOABS 0.7 01/30/2022 0916   EOSABS 0.1 01/30/2022 0916   BASOSABS 0.0 01/30/2022 0916   Comprehensive Metabolic Panel:    Component Value  Date/Time   NA 139 01/30/2022 0916   K 4.0 01/30/2022 0916   CL 108 01/30/2022 0916   CO2 25 01/30/2022 0916   BUN 8 01/30/2022 0916   CREATININE 0.61 01/30/2022 0916   GLUCOSE 103 (H) 01/30/2022 0916   CALCIUM 9.1 01/30/2022 0916   AST 28 01/30/2022 0916   ALT 43 01/30/2022 0916   ALKPHOS 111 01/30/2022 0916   BILITOT 0.3 01/30/2022 0916   PROT 6.6 01/30/2022 0916   ALBUMIN 4.2 01/30/2022 0916    RADIOGRAPHIC STUDIES: CT CHEST ABDOMEN PELVIS W CONTRAST  Result Date: 01/28/2022 CLINICAL DATA:  Left breast cancer with bone metastasis. Axillary and thoracic nodal metastasis. Chemotherapy ongoing. Prior oophorectomy and salpingectomy. * Tracking Code: BO * EXAM: CT CHEST, ABDOMEN, AND PELVIS WITH CONTRAST TECHNIQUE: Multidetector CT imaging of the chest, abdomen and pelvis was performed following the standard protocol during bolus administration of intravenous contrast. RADIATION DOSE  REDUCTION: This exam was performed according to the departmental dose-optimization program which includes automated exposure control, adjustment of the mA and/or kV according to patient size and/or use of iterative reconstruction technique. CONTRAST:  13m OMNIPAQUE IOHEXOL 300 MG/ML  SOLN COMPARISON:  09/16/2021 PET.  CTs of 03/19/2021 FINDINGS: CT CHEST FINDINGS Cardiovascular: Right Port-A-Cath tip high right atrium. Normal aortic caliber. Normal heart size, without pericardial effusion. No central pulmonary embolism, on this non-dedicated study. Mediastinum/Nodes: No supraclavicular adenopathy. Index right axillary node measures 1.1 x 1.8 cm in 14/2 versus 1.0 x 1.4 cm on the prior PET. Index left axillary node measures 1.0 cm on 12/02 versus 1.3 cm on the prior exam. Index subcarinal node measures 1.5 cm in 25/2 and is new. Adjacent node within the azygoesophageal recess measures 1.4 cm on 27/2 and is enlarged from 9 mm on the prior PET. Bilateral hilar adenopathy, with a right-sided node measuring 1.7 cm on  24/2, poorly evaluated on the prior PET but felt to be new or increased. Index prevascular node of 9 mm on 22/2 is increased from 5 mm on the prior PET. small nodes within the juxta cardiophrenic fat of the lower left chest on 49/2 are new and suspicious. Lungs/Pleura: No pleural fluid. No dominant pulmonary nodule or mass. New or significantly progressive scattered ground-glass, septal thickening, and micronodularity. Example within the right upper lobe on 42/4. Musculoskeletal: Progressive left breast primary with overlying skin thickening indicative of an inflammatory component. Example 3.7 x 3.0 cm on 27/2 versus 2.4 x 3.1 cm when remeasured in a similar fashion on 09/16/2021. Similar appearance of multifocal sclerotic osseous metastasis. CT ABDOMEN PELVIS FINDINGS Hepatobiliary: Normal liver. Normal gallbladder, without biliary ductal dilatation. Pancreas: Normal, without mass or ductal dilatation. Spleen: Normal in size, without focal abnormality. Adrenals/Urinary Tract: Normal adrenal glands. Normal kidneys, without hydronephrosis. Normal urinary bladder. Stomach/Bowel: Normal stomach, without wall thickening. Normal colon and terminal ileum. Normal small bowel. Vascular/Lymphatic: Normal caliber of the aorta and branch vessels. Retroaortic left renal vein. No abdominopelvic adenopathy. Reproductive: Normal uterus.  No adnexal mass. Other: No significant free fluid. No evidence of omental or peritoneal disease. Musculoskeletal: Multifocal sclerotic metastasis throughout the pelvis and lumbar spine are similar in severity and distribution to the prior PET. Example posterior right iliac 1.9 cm lesion on 93/2 measured 2.0 cm on the prior. L3 moderate compression deformity with ventral canal encroachment, similar to the 03/19/2021 CT. Mild L4 compression deformity is also unchanged. IMPRESSION: CT CHEST IMPRESSION 1. Since the PET of 09/16/2021, progression of left breast mass and thoracic nodal metastasis. 2.  New or significantly progressive diffuse pulmonary process including scattered ground-glass, septal thickening, and micronodularity. Favor metastatic disease including lymphangitic tumor spread. Infectious/inflammatory etiologies felt less likely. 3. Similar osseous metastasis. CT ABDOMEN AND PELVIS IMPRESSION No evidence of soft tissue metastasis within the abdomen or pelvis. Electronically Signed   By: KAbigail MiyamotoM.D.   On: 01/28/2022 08:42    PERFORMANCE STATUS (ECOG) : 1 - Symptomatic but completely ambulatory  Review of Systems  Constitutional:  Positive for fatigue.  Musculoskeletal:  Positive for arthralgias.  Unless otherwise noted, a complete review of systems is negative.  Physical Exam General: NAD, ambulatory  Cardiovascular: regular rate and rhythm Pulmonary: clear ant fields Abdomen: soft, nontender, + bowel sounds Extremities: no edema, no joint deformities Skin: no rashes, left breast mass, inverted nipple, tender to touch Neurological:AAO x3  IMPRESSION: This is my initial visit with Ellen [name]. She presents to clinic today alone.  No acute distress noted. Ambulatory. Alert and oriented. Able to engage in discussions appropriately.   I introduced myself, Ellen [name], and Palliative's role in collaboration with the oncology team. Concept of Palliative Care was introduced as specialized medical care for people and their families living with serious illness.  It focuses on providing relief from the symptoms and stress of a serious illness.  The goal is to improve quality of life for both the patient and the family. Values and goals of care important to patient and family were attempted to be elicited.   Ellen [name] is currently living with her sister and boyfriend, after a recent break-up with her long-term boyfriend. No children. Pets. She worked in Designer, television/film set. Her mother is also a part of her support team.   Ellen [name] is independent of all ADLs. Does endorse occasional  fatigue. Left breast and chest wall tenderness. Describes pain as aching, pins, and burning. She is cleaning around the nipple area with cotton swab due to tenderness.   We discussed current regimen. She does gain some relief with gabapentin. Recently increased dose (Tuesday) to 200 mg three times daily. Noted some improvement in symptoms. We discussed goal to continue to titrate medication up to gain better relief. If she continues to tolerate over the next 3-4 days instructed to increase dose to 300 mg. She verbalized understanding. Ellen [name] does endorse some increased  pain at night. We discussed use of additional pain medication however she would like to continue with gabapentin at this time and limit opioid use until absolutely necessary. She knows to contact the office if pain becomes more intense or unmanaged.   We discussed her current illness and what it means in the larger context of Her on-going co-morbidities. Natural disease trajectory and expectations were discussed.  Ellen [name] is realistic in her understanding. She speaks to her progression and hopes of some stability with treatments. She is remaining hopeful for the best while also preparing for the worst. She is emotional speaking of her fatigue and condition knowing she has always been the family member/friend that everyone has relied on and given her health she is no longer that person. Emotional support provided. We discussed focusing on her needs and what is most important, allowing others to be there for her, and how to effectively share her emotions/feelings with those closes to her. She verbalized understanding and appreciation. Education provided on guided imagery and deep breathing during those difficult times. She shares she will often go into her room and cry. Emotional support provided and encouraging her that it is ok to feel the way that she is feeling as she has very valid reasons to be overwhelmed at times. She shares her mother  is a Teacher, music and also of good support. Her sister and bestfriend is also.   I discussed the importance of continued conversation with family and their medical providers regarding overall plan of care and treatment options, ensuring decisions are within the context of the patients values and GOCs.  PLAN: Established therapeutic relationship. Education provided on palliative's role in collaboration with their Oncology/Radiation team. Ongoing goals of care and support as needed Continue increasing gabapentin dose as tolerated. Will plan to increase to 300 mg over next 3-4 days if tolerating 256m.  I will plan to see patient back in 2-3 weeks in collaboration to other oncology appointments.    Patient expressed understanding and was in agreement with this plan. She also understands that She can call the clinic at any time  with any questions, concerns, or complaints.   Thank you for your referral and allowing Palliative to assist in Ellen [name] Mow's care.   Number and complexity of problems addressed: HIGH - 1 or more chronic illnesses with SEVERE exacerbation, progression, or side effects of treatment - advanced cancer, pain. Any controlled substances utilized were prescribed in the context of palliative care.  Time Total: 65 min   Visit consisted of counseling and education dealing with the complex and emotionally intense issues of symptom management and palliative care in the setting of serious and potentially life-threatening illness.Greater than 50%  of this time was spent counseling and coordinating care related to the above assessment and plan.  Signed by: Alda Lea, AGPCNP-BC Palliative Medicine Team/[place]

## 2022-02-12 ENCOUNTER — Ambulatory Visit: Payer: Medicaid Other

## 2022-02-12 ENCOUNTER — Other Ambulatory Visit: Payer: Medicaid Other

## 2022-02-12 ENCOUNTER — Inpatient Hospital Stay (HOSPITAL_BASED_OUTPATIENT_CLINIC_OR_DEPARTMENT_OTHER): Payer: Medicaid Other | Admitting: Nurse Practitioner

## 2022-02-12 ENCOUNTER — Encounter: Payer: Self-pay | Admitting: Hematology and Oncology

## 2022-02-12 VITALS — BP 124/78 | HR 116 | Temp 99.0°F | Resp 18 | Ht 65.0 in | Wt 146.7 lb

## 2022-02-12 DIAGNOSIS — Z515 Encounter for palliative care: Secondary | ICD-10-CM | POA: Diagnosis not present

## 2022-02-12 DIAGNOSIS — C50919 Malignant neoplasm of unspecified site of unspecified female breast: Secondary | ICD-10-CM

## 2022-02-12 DIAGNOSIS — M792 Neuralgia and neuritis, unspecified: Secondary | ICD-10-CM | POA: Diagnosis not present

## 2022-02-12 DIAGNOSIS — R530 Neoplastic (malignant) related fatigue: Secondary | ICD-10-CM

## 2022-02-12 DIAGNOSIS — C7951 Secondary malignant neoplasm of bone: Secondary | ICD-10-CM

## 2022-02-12 DIAGNOSIS — F419 Anxiety disorder, unspecified: Secondary | ICD-10-CM

## 2022-02-12 DIAGNOSIS — C50912 Malignant neoplasm of unspecified site of left female breast: Secondary | ICD-10-CM

## 2022-02-12 DIAGNOSIS — Z7189 Other specified counseling: Secondary | ICD-10-CM

## 2022-02-12 DIAGNOSIS — G893 Neoplasm related pain (acute) (chronic): Secondary | ICD-10-CM

## 2022-02-12 NOTE — Patient Instructions (Addendum)
-   increase your gabapentin to '200mg'$  (2 pills) 3 times a day to help with nerve pain, on Saturday 02/15/22 increase gabapentin up to '300mg'$  (3 pills) 3 times a day - do not exceed '2400mg'$  of tylenol in 24 hours - call the office with any questions or concerns or MyChart message [place]

## 2022-02-13 ENCOUNTER — Ambulatory Visit: Payer: Medicaid Other

## 2022-02-13 ENCOUNTER — Inpatient Hospital Stay: Payer: Medicaid Other

## 2022-02-13 MED FILL — Fosaprepitant Dimeglumine For IV Infusion 150 MG (Base Eq): INTRAVENOUS | Qty: 5 | Status: AC

## 2022-02-13 MED FILL — Dexamethasone Sodium Phosphate Inj 100 MG/10ML: INTRAMUSCULAR | Qty: 1 | Status: AC

## 2022-02-14 ENCOUNTER — Inpatient Hospital Stay: Payer: Medicaid Other

## 2022-02-14 ENCOUNTER — Ambulatory Visit: Payer: Medicaid Other

## 2022-02-14 VITALS — BP 101/62 | HR 103 | Temp 98.2°F | Resp 20 | Wt 148.0 lb

## 2022-02-14 DIAGNOSIS — C50919 Malignant neoplasm of unspecified site of unspecified female breast: Secondary | ICD-10-CM

## 2022-02-14 DIAGNOSIS — Z5111 Encounter for antineoplastic chemotherapy: Secondary | ICD-10-CM | POA: Diagnosis not present

## 2022-02-14 DIAGNOSIS — C50912 Malignant neoplasm of unspecified site of left female breast: Secondary | ICD-10-CM

## 2022-02-14 DIAGNOSIS — Z95828 Presence of other vascular implants and grafts: Secondary | ICD-10-CM

## 2022-02-14 LAB — CBC WITH DIFFERENTIAL (CANCER CENTER ONLY)
Abs Immature Granulocytes: 0.01 10*3/uL (ref 0.00–0.07)
Basophils Absolute: 0 10*3/uL (ref 0.0–0.1)
Basophils Relative: 0 %
Eosinophils Absolute: 0.2 10*3/uL (ref 0.0–0.5)
Eosinophils Relative: 3 %
HCT: 32.4 % — ABNORMAL LOW (ref 36.0–46.0)
Hemoglobin: 10.6 g/dL — ABNORMAL LOW (ref 12.0–15.0)
Immature Granulocytes: 0 %
Lymphocytes Relative: 17 %
Lymphs Abs: 1.2 10*3/uL (ref 0.7–4.0)
MCH: 32.1 pg (ref 26.0–34.0)
MCHC: 32.7 g/dL (ref 30.0–36.0)
MCV: 98.2 fL (ref 80.0–100.0)
Monocytes Absolute: 0.6 10*3/uL (ref 0.1–1.0)
Monocytes Relative: 8 %
Neutro Abs: 5.1 10*3/uL (ref 1.7–7.7)
Neutrophils Relative %: 72 %
Platelet Count: 259 10*3/uL (ref 150–400)
RBC: 3.3 MIL/uL — ABNORMAL LOW (ref 3.87–5.11)
RDW: 16.5 % — ABNORMAL HIGH (ref 11.5–15.5)
WBC Count: 7.1 10*3/uL (ref 4.0–10.5)
nRBC: 0 % (ref 0.0–0.2)

## 2022-02-14 LAB — CMP (CANCER CENTER ONLY)
ALT: 11 U/L (ref 0–44)
AST: 22 U/L (ref 15–41)
Albumin: 3.7 g/dL (ref 3.5–5.0)
Alkaline Phosphatase: 67 U/L (ref 38–126)
Anion gap: 5 (ref 5–15)
BUN: 9 mg/dL (ref 6–20)
CO2: 27 mmol/L (ref 22–32)
Calcium: 9.6 mg/dL (ref 8.9–10.3)
Chloride: 107 mmol/L (ref 98–111)
Creatinine: 0.63 mg/dL (ref 0.44–1.00)
GFR, Estimated: >60 mL/min (ref >60)
Glucose, Bld: 115 mg/dL — ABNORMAL HIGH (ref 70–99)
Potassium: 4.3 mmol/L (ref 3.5–5.1)
Sodium: 139 mmol/L (ref 135–145)
Total Bilirubin: 0.4 mg/dL (ref 0.3–1.2)
Total Protein: 6.6 g/dL (ref 6.5–8.1)

## 2022-02-14 LAB — MAGNESIUM: Magnesium: 2.1 mg/dL (ref 1.7–2.4)

## 2022-02-14 LAB — PHOSPHORUS: Phosphorus: 3.7 mg/dL (ref 2.5–4.6)

## 2022-02-14 MED ORDER — SODIUM CHLORIDE 0.9 % IV SOLN
150.0000 mg | Freq: Once | INTRAVENOUS | Status: AC
  Administered 2022-02-14: 150 mg via INTRAVENOUS
  Filled 2022-02-14: qty 150

## 2022-02-14 MED ORDER — SODIUM CHLORIDE 0.9 % IV SOLN
10.2000 mg/kg | Freq: Once | INTRAVENOUS | Status: AC
  Administered 2022-02-14: 720 mg via INTRAVENOUS
  Filled 2022-02-14: qty 72

## 2022-02-14 MED ORDER — FAMOTIDINE IN NACL 20-0.9 MG/50ML-% IV SOLN
20.0000 mg | Freq: Once | INTRAVENOUS | Status: AC
  Administered 2022-02-14: 20 mg via INTRAVENOUS
  Filled 2022-02-14: qty 50

## 2022-02-14 MED ORDER — ACETAMINOPHEN 325 MG PO TABS
650.0000 mg | ORAL_TABLET | Freq: Once | ORAL | Status: DC
  Filled 2022-02-14: qty 2

## 2022-02-14 MED ORDER — HEPARIN SOD (PORK) LOCK FLUSH 100 UNIT/ML IV SOLN
500.0000 [IU] | Freq: Once | INTRAVENOUS | Status: AC | PRN
  Administered 2022-02-14: 500 [IU]

## 2022-02-14 MED ORDER — DIPHENHYDRAMINE HCL 50 MG/ML IJ SOLN
50.0000 mg | Freq: Once | INTRAMUSCULAR | Status: AC
  Administered 2022-02-14: 50 mg via INTRAVENOUS
  Filled 2022-02-14: qty 1

## 2022-02-14 MED ORDER — PALONOSETRON HCL INJECTION 0.25 MG/5ML
0.2500 mg | Freq: Once | INTRAVENOUS | Status: AC
  Administered 2022-02-14: 0.25 mg via INTRAVENOUS
  Filled 2022-02-14: qty 5

## 2022-02-14 MED ORDER — SODIUM CHLORIDE 0.9 % IV SOLN: Freq: Once | INTRAVENOUS | Status: AC

## 2022-02-14 MED ORDER — SODIUM CHLORIDE 0.9 % IV SOLN
10.0000 mg | Freq: Once | INTRAVENOUS | Status: AC
  Administered 2022-02-14: 10 mg via INTRAVENOUS
  Filled 2022-02-14: qty 10

## 2022-02-14 MED ORDER — SODIUM CHLORIDE 0.9% FLUSH
10.0000 mL | INTRAVENOUS | Status: DC | PRN
  Administered 2022-02-14: 10 mL

## 2022-02-14 MED ORDER — DENOSUMAB 120 MG/1.7ML ~~LOC~~ SOLN
120.0000 mg | Freq: Once | SUBCUTANEOUS | Status: AC
  Administered 2022-02-14: 120 mg via SUBCUTANEOUS
  Filled 2022-02-14: qty 1.7

## 2022-02-14 NOTE — Progress Notes (Signed)
OK to adj Trodelvy dose using today's weight per Wilber Bihari, NP.  Kennith Center, Pharm.D., CPP 02/14/2022'@10'$ :26 AM

## 2022-02-14 NOTE — Patient Instructions (Signed)
[facility]  Discharge Instructions: Thank you for choosing [facility] to provide your oncology and hematology care.   If you have a lab appointment with the [facility], please go directly to the [facility] and check in at the registration area.   Wear comfortable clothing and clothing appropriate for easy access to any Portacath or PICC line.   We strive to give you quality time with your provider. You may need to reschedule your appointment if you arrive late (15 or more minutes).  Arriving late affects you and other patients whose appointments are after yours.  Also, if you miss three or more appointments without notifying the office, you may be dismissed from the clinic at the provider's discretion.      For prescription refill requests, have your pharmacy contact our office and allow 72 hours for refills to be completed.    Today you received the following chemotherapy and/or immunotherapy agents [name]      To help prevent nausea and vomiting after your treatment, we encourage you to take your nausea medication as directed.  BELOW ARE SYMPTOMS THAT SHOULD BE REPORTED IMMEDIATELY: *FEVER GREATER THAN 100.4 F (38 C) OR HIGHER *CHILLS OR SWEATING *NAUSEA AND VOMITING THAT IS NOT CONTROLLED WITH YOUR NAUSEA MEDICATION *UNUSUAL SHORTNESS OF BREATH *UNUSUAL BRUISING OR BLEEDING *URINARY PROBLEMS (pain or burning when urinating, or frequent urination) *BOWEL PROBLEMS (unusual diarrhea, constipation, pain near the anus) TENDERNESS IN MOUTH AND THROAT WITH OR WITHOUT PRESENCE OF ULCERS (sore throat, sores in mouth, or a toothache) UNUSUAL RASH, SWELLING OR PAIN  UNUSUAL VAGINAL DISCHARGE OR ITCHING   Items with * indicate a potential emergency and should be followed up as soon as possible or go to the Emergency Department if any problems should occur.  Please show the CHEMOTHERAPY ALERT CARD or IMMUNOTHERAPY ALERT CARD at check-in to  the Emergency Department and triage nurse.  Should you have questions after your visit or need to cancel or reschedule your appointment, please contact [facility]  Dept: [phone]  and follow the prompts.  Office hours are 8:00 a.m. to 4:30 p.m. Monday - Friday. Please note that voicemails left after 4:00 p.m. may not be returned until the following business day.  We are closed weekends and major holidays. You have access to a nurse at all times for urgent questions. Please call the main number to the clinic Dept: [phone] and follow the prompts.   For any non-urgent questions, you may also contact your provider using MyChart. We now offer e-Visits for anyone 81 and older to request care online for non-urgent symptoms. For details visit [email or web address].   Also download the MyChart app! Go to the app store, search "MyChart", open the app, select [facility], and log in with your MyChart username and password.  Sacituzumab Govitecan Injection What is this medication? SACITUZUMAB GOVITECAN (SAK i TOOZ ue mab GOE vi TEE kan) treats breast cancer. It may also be used to treat bladder cancer and kidney cancer. It works by blocking a protein that causes cancer cells to grow and multiply. This helps to slow or stop the spread of cancer cells. This medicine may be used for other purposes; ask your health care provider or pharmacist if you have questions. COMMON BRAND NAME(S): TRODELVY What should I tell my care team before I take this medication? They need to know if you have any of these conditions: Carry the UGT1A1*28 gene Infection Liver  disease An unusual or allergic reaction to sacituzumab govitecan, other medications, foods, dyes, or preservatives Pregnant or trying to get pregnant Breast-feeding How should I use this medication? This medication is injected into a vein. It is given by your care team in a hospital or clinic setting. Talk to your care  team about the use of this medication in children. Special care may be needed. Overdosage: If you think you have taken too much of this medicine contact a poison control center or emergency room at once. NOTE: This medicine is only for you. Do not share this medicine with others. What if I miss a dose? Keep appointments for follow-up doses. It is important not to miss your dose. Call your care team if you are unable to keep an appointment. What may interact with this medication? This medication may affect how other medications work, and other medications may affect the way this medication works. Talk with your care team about all of the medications you take. They may suggest changes to your treatment plan to lower the risk of side effects and to make sure your medications work as intended. This list may not describe all possible interactions. Give your health care provider a list of all the medicines, herbs, non-prescription drugs, or dietary supplements you use. Also tell them if you smoke, drink alcohol, or use illegal drugs. Some items may interact with your medicine. What should I watch for while using this medication? This medication may make you feel generally unwell. This is not uncommon as chemotherapy can affect healthy cells as well as cancer cells. Report any side effects. Continue your course of treatment even though you feel ill unless your care team tells you to stop. You may need blood work while you are taking this medication. Certain genetic factors may decrease the safety of this medication. Your care team may use genetic tests to determine treatment. This medication can cause serious allergic reactions. To reduce your risk, your care team may give you other medications to take before receiving this one. Be sure to follow the directions from your care team. Check with your care team if you have severe diarrhea, nausea, and vomiting, or if you sweat a lot. The loss of too much body  fluid may make it dangerous for you to take this medication. Talk to your care team if you wish to become pregnant or think you might be pregnant. This medication can cause serious birth defects if taken during pregnancy or if you get pregnant within 6 months after stopping treatment. A negative pregnancy test is required before starting this medication. A reliable form of contraception is recommended while taking this medication and for 6 months after stopping treatment. Talk to your care team about reliable forms of contraception. Use a condom during sex and for 3 months after stopping treatment. Tell your care team right away if you think your partner might be pregnant. This medication can cause serious birth defects. Do not breast-feed while taking this medication and for 1 month after stopping therapy. This medication may cause infertility. Talk to your care team if you are concerned about your fertility. This medication may increase your risk of getting an infection. Call your care team for advice if you get a fever, chills, sore throat, or other symptoms of a cold or flu. Do not treat yourself. Try to avoid being around people who are sick. Avoid taking medications that contain aspirin, acetaminophen, ibuprofen, naproxen, or ketoprofen unless instructed by your care team.  These medications may hide a fever. This medication may increase blood sugar. The risk may be higher in patients who already have diabetes. Ask your care team what you can do to lower your risk of diabetes while taking this medication. What side effects may I notice from receiving this medication? Side effects that you should report to your care team as soon as possible: Allergic reactions--skin rash, itching, hives, swelling of the face, lips, tongue, or throat Infection--fever, chills, cough, or sore throat Infusion reactions--chest pain, shortness of breath or trouble breathing, feeling faint or lightheaded Low red blood cell  level--unusual weakness or fatigue, dizziness, headache, trouble breathing Severe or prolonged diarrhea Side effects that usually do not require medical attention (report these to your care team if they continue or are bothersome): Constipation Diarrhea Fatigue Hair loss Loss of appetite Nausea Vomiting This list may not describe all possible side effects. Call your doctor for medical advice about side effects. You may report side effects to FDA at 1-800-FDA-1088. Where should I keep my medication? This medication is given in a hospital or clinic. It will not be stored at home. NOTE: This sheet is a summary. It may not cover all possible information. If you have questions about this medicine, talk to your doctor, pharmacist, or health care provider.  2023 [employer] Standard (2021-04-04 00:00:00)

## 2022-02-17 ENCOUNTER — Encounter: Payer: Self-pay | Admitting: Nurse Practitioner

## 2022-02-17 ENCOUNTER — Encounter: Payer: Self-pay | Admitting: Hematology and Oncology

## 2022-02-18 ENCOUNTER — Encounter: Payer: Self-pay | Admitting: Hematology and Oncology

## 2022-02-18 ENCOUNTER — Ambulatory Visit: Payer: Medicaid Other

## 2022-02-18 NOTE — Telephone Encounter (Signed)
Tried to reach pt to discuss how she did with her new treatment.  Left VM to call back.

## 2022-02-18 NOTE — Telephone Encounter (Signed)
-----   Message from Rolene Course, RN sent at 02/14/2022  3:37 PM EST ----- Regarding: Gudena 1st Tx F/U call - Wilfred Lacy 1st Tx F/U call - Tro[place].  Tolerated infusion well.

## 2022-02-19 ENCOUNTER — Ambulatory Visit: Payer: Medicaid Other

## 2022-02-20 ENCOUNTER — Ambulatory Visit: Payer: Medicaid Other

## 2022-02-20 ENCOUNTER — Other Ambulatory Visit: Payer: Self-pay | Admitting: Hematology and Oncology

## 2022-02-20 ENCOUNTER — Encounter: Payer: Self-pay | Admitting: Adult Health

## 2022-02-20 ENCOUNTER — Inpatient Hospital Stay: Payer: Medicaid Other | Attending: Adult Health | Admitting: Adult Health

## 2022-02-20 ENCOUNTER — Other Ambulatory Visit: Payer: Self-pay

## 2022-02-20 ENCOUNTER — Inpatient Hospital Stay: Payer: Medicaid Other

## 2022-02-20 DIAGNOSIS — C50919 Malignant neoplasm of unspecified site of unspecified female breast: Secondary | ICD-10-CM | POA: Insufficient documentation

## 2022-02-20 DIAGNOSIS — Z170 Estrogen receptor positive status [ER+]: Secondary | ICD-10-CM | POA: Insufficient documentation

## 2022-02-20 DIAGNOSIS — Z79899 Other long term (current) drug therapy: Secondary | ICD-10-CM | POA: Insufficient documentation

## 2022-02-20 DIAGNOSIS — R112 Nausea with vomiting, unspecified: Secondary | ICD-10-CM | POA: Insufficient documentation

## 2022-02-20 DIAGNOSIS — R197 Diarrhea, unspecified: Secondary | ICD-10-CM | POA: Insufficient documentation

## 2022-02-20 DIAGNOSIS — C7951 Secondary malignant neoplasm of bone: Secondary | ICD-10-CM

## 2022-02-20 DIAGNOSIS — C50912 Malignant neoplasm of unspecified site of left female breast: Secondary | ICD-10-CM | POA: Diagnosis not present

## 2022-02-20 DIAGNOSIS — J4520 Mild intermittent asthma, uncomplicated: Secondary | ICD-10-CM | POA: Diagnosis not present

## 2022-02-20 DIAGNOSIS — J45909 Unspecified asthma, uncomplicated: Secondary | ICD-10-CM | POA: Insufficient documentation

## 2022-02-20 DIAGNOSIS — Z8551 Personal history of malignant neoplasm of bladder: Secondary | ICD-10-CM | POA: Diagnosis not present

## 2022-02-20 DIAGNOSIS — K219 Gastro-esophageal reflux disease without esophagitis: Secondary | ICD-10-CM | POA: Insufficient documentation

## 2022-02-20 DIAGNOSIS — Z800 Family history of malignant neoplasm of digestive organs: Secondary | ICD-10-CM | POA: Diagnosis not present

## 2022-02-20 DIAGNOSIS — Z5111 Encounter for antineoplastic chemotherapy: Secondary | ICD-10-CM | POA: Diagnosis present

## 2022-02-20 DIAGNOSIS — C787 Secondary malignant neoplasm of liver and intrahepatic bile duct: Secondary | ICD-10-CM | POA: Insufficient documentation

## 2022-02-20 DIAGNOSIS — F17210 Nicotine dependence, cigarettes, uncomplicated: Secondary | ICD-10-CM | POA: Insufficient documentation

## 2022-02-20 DIAGNOSIS — Z95828 Presence of other vascular implants and grafts: Secondary | ICD-10-CM

## 2022-02-20 LAB — CMP (CANCER CENTER ONLY)
ALT: 17 U/L (ref 0–44)
AST: 22 U/L (ref 15–41)
Albumin: 3.9 g/dL (ref 3.5–5.0)
Alkaline Phosphatase: 69 U/L (ref 38–126)
Anion gap: 5 (ref 5–15)
BUN: 14 mg/dL (ref 6–20)
CO2: 27 mmol/L (ref 22–32)
Calcium: 9 mg/dL (ref 8.9–10.3)
Chloride: 106 mmol/L (ref 98–111)
Creatinine: 0.59 mg/dL (ref 0.44–1.00)
GFR, Estimated: >60 mL/min (ref >60)
Glucose, Bld: 99 mg/dL (ref 70–99)
Potassium: 3.9 mmol/L (ref 3.5–5.1)
Sodium: 138 mmol/L (ref 135–145)
Total Bilirubin: 0.2 mg/dL — ABNORMAL LOW (ref 0.3–1.2)
Total Protein: 6.3 g/dL — ABNORMAL LOW (ref 6.5–8.1)

## 2022-02-20 LAB — CBC WITH DIFFERENTIAL (CANCER CENTER ONLY)
Abs Immature Granulocytes: 0.01 10*3/uL (ref 0.00–0.07)
Basophils Absolute: 0 10*3/uL (ref 0.0–0.1)
Basophils Relative: 1 %
Eosinophils Absolute: 0.1 10*3/uL (ref 0.0–0.5)
Eosinophils Relative: 2 %
HCT: 31.1 % — ABNORMAL LOW (ref 36.0–46.0)
Hemoglobin: 10.7 g/dL — ABNORMAL LOW (ref 12.0–15.0)
Immature Granulocytes: 0 %
Lymphocytes Relative: 19 %
Lymphs Abs: 0.8 10*3/uL (ref 0.7–4.0)
MCH: 32.6 pg (ref 26.0–34.0)
MCHC: 34.4 g/dL (ref 30.0–36.0)
MCV: 94.8 fL (ref 80.0–100.0)
Monocytes Absolute: 0.2 10*3/uL (ref 0.1–1.0)
Monocytes Relative: 4 %
Neutro Abs: 3.1 10*3/uL (ref 1.7–7.7)
Neutrophils Relative %: 74 %
Platelet Count: 207 10*3/uL (ref 150–400)
RBC: 3.28 MIL/uL — ABNORMAL LOW (ref 3.87–5.11)
RDW: 14.9 % (ref 11.5–15.5)
WBC Count: 4.1 10*3/uL (ref 4.0–10.5)
nRBC: 0 % (ref 0.0–0.2)

## 2022-02-20 MED ORDER — SODIUM CHLORIDE 0.9% FLUSH
10.0000 mL | Freq: Once | INTRAVENOUS | Status: AC
  Administered 2022-02-20: 10 mL

## 2022-02-20 MED ORDER — GABAPENTIN 300 MG PO CAPS: 300.0000 mg | ORAL_CAPSULE | Freq: Three times a day (TID) | ORAL | 3 refills | Status: DC

## 2022-02-20 MED ORDER — HEPARIN SOD (PORK) LOCK FLUSH 100 UNIT/ML IV SOLN
500.0000 [IU] | Freq: Once | INTRAVENOUS | Status: AC
  Administered 2022-02-20: 500 [IU]

## 2022-02-20 MED FILL — Fosaprepitant Dimeglumine For IV Infusion 150 MG (Base Eq): INTRAVENOUS | Qty: 5 | Status: AC

## 2022-02-20 MED FILL — Dexamethasone Sodium Phosphate Inj 100 MG/10ML: INTRAMUSCULAR | Qty: 1 | Status: AC
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
## 2022-02-20 NOTE — Assessment & Plan Note (Signed)
Ellen [name] is a 27 year old woman with metastatic breast cancer here today for follow-up and evaluation prior to receiving cycle 1 day 8 of Trodelvy.  She is tolerating this treatment relatively well.  Nausea is managed with Compazine and Ativan and her neuropathic breast pain is managed with gabapentin 300 mg 3 times a day which she tolerates well.  I agree with her about her cough likely being related to smoking marijuana and a am glad that she was able to quit doing this.  She is also taking Protonix daily which is helping as well.  Her breast mass is larger than it was back on November 10 when we were discussing potentially starting her radiation.  I am hopeful that it will improve as she continues on [place].  We will see her back in 2 weeks for her next cycle of treatment.

## 2022-02-20 NOTE — Progress Notes (Signed)
Left patient accessed after port flush with labs because treatment is tomorrow morning at 0730. Take home dressing was placed.

## 2022-02-21 ENCOUNTER — Ambulatory Visit: Payer: Medicaid Other | Admitting: Adult Health

## 2022-02-21 ENCOUNTER — Other Ambulatory Visit: Payer: Medicaid Other

## 2022-02-21 ENCOUNTER — Inpatient Hospital Stay: Payer: Medicaid Other

## 2022-02-21 ENCOUNTER — Encounter: Payer: Self-pay | Admitting: Nurse Practitioner

## 2022-02-21 ENCOUNTER — Inpatient Hospital Stay (HOSPITAL_BASED_OUTPATIENT_CLINIC_OR_DEPARTMENT_OTHER): Payer: Medicaid Other | Admitting: Nurse Practitioner

## 2022-02-21 VITALS — BP 128/78 | HR 70 | Temp 98.2°F | Resp 18 | Wt 145.4 lb

## 2022-02-21 DIAGNOSIS — C50919 Malignant neoplasm of unspecified site of unspecified female breast: Secondary | ICD-10-CM

## 2022-02-21 DIAGNOSIS — C50912 Malignant neoplasm of unspecified site of left female breast: Secondary | ICD-10-CM

## 2022-02-21 DIAGNOSIS — Z5111 Encounter for antineoplastic chemotherapy: Secondary | ICD-10-CM | POA: Diagnosis not present

## 2022-02-21 DIAGNOSIS — M792 Neuralgia and neuritis, unspecified: Secondary | ICD-10-CM

## 2022-02-21 DIAGNOSIS — Z515 Encounter for palliative care: Secondary | ICD-10-CM | POA: Diagnosis not present

## 2022-02-21 MED ORDER — SODIUM CHLORIDE 0.9 % IV SOLN
150.0000 mg | Freq: Once | INTRAVENOUS | Status: AC
  Administered 2022-02-21: 150 mg via INTRAVENOUS
  Filled 2022-02-21: qty 150

## 2022-02-21 MED ORDER — PALONOSETRON HCL INJECTION 0.25 MG/5ML
0.2500 mg | Freq: Once | INTRAVENOUS | Status: AC
  Administered 2022-02-21: 0.25 mg via INTRAVENOUS
  Filled 2022-02-21: qty 5

## 2022-02-21 MED ORDER — SODIUM CHLORIDE 0.9 % IV SOLN
10.0000 mg | Freq: Once | INTRAVENOUS | Status: AC
  Administered 2022-02-21: 10 mg via INTRAVENOUS
  Filled 2022-02-21: qty 10

## 2022-02-21 MED ORDER — DIPHENHYDRAMINE HCL 50 MG/ML IJ SOLN
50.0000 mg | Freq: Once | INTRAMUSCULAR | Status: AC
  Administered 2022-02-21: 50 mg via INTRAVENOUS
  Filled 2022-02-21: qty 1

## 2022-02-21 MED ORDER — ACETAMINOPHEN 325 MG PO TABS
650.0000 mg | ORAL_TABLET | Freq: Once | ORAL | Status: AC
  Administered 2022-02-21: 650 mg via ORAL
  Filled 2022-02-21: qty 2

## 2022-02-21 MED ORDER — SODIUM CHLORIDE 0.9 % IV SOLN: Freq: Once | INTRAVENOUS | Status: AC

## 2022-02-21 MED ORDER — SODIUM CHLORIDE 0.9% FLUSH: 10.0000 mL | INTRAVENOUS | Status: DC | PRN

## 2022-02-21 MED ORDER — HEPARIN SOD (PORK) LOCK FLUSH 100 UNIT/ML IV SOLN: 500.0000 [IU] | Freq: Once | INTRAVENOUS | Status: DC | PRN

## 2022-02-21 MED ORDER — FAMOTIDINE IN NACL 20-0.9 MG/50ML-% IV SOLN
20.0000 mg | Freq: Once | INTRAVENOUS | Status: AC
  Administered 2022-02-21: 20 mg via INTRAVENOUS
  Filled 2022-02-21: qty 50

## 2022-02-21 MED ORDER — SODIUM CHLORIDE 0.9 % IV SOLN
10.2000 mg/kg | Freq: Once | INTRAVENOUS | Status: AC
  Administered 2022-02-21: 720 mg via INTRAVENOUS
  Filled 2022-02-21: qty 72

## 2022-02-21 NOTE — Progress Notes (Signed)
[place]  [phone] [phone]) [phone]   Name: Ellen [name] Date: 02/21/2022 MRN: [id number]  DOB: 03-02-1995  Patient Care Team: Pcp, No as PCP - General [name], MD as Consulting Physician (Hematology and Oncology) Delice Bison, [name], NP as Nurse Practitioner (Hematology and Oncology) [name], MD as Consulting Physician (General Surgery) [name], MD as Consulting Physician (Radiation Oncology) [name], [name], NP as Nurse Practitioner (Nurse Practitioner) [name], [name], NP as Nurse Practitioner (Nurse Practitioner)    INTERVAL HISTORY: Ellen [name] is a 27 y.o. 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  Palliative ask to see for symptom management and goals of care.   SOCIAL HISTORY:     reports that she has never smoked. She has never used smokeless tobacco. She reports that she does not currently use alcohol. She reports current drug use. Drug: Marijuana.  ADVANCE DIRECTIVES:    CODE STATUS: Full code  PAST MEDICAL HISTORY: Past Medical History:  Diagnosis Date   ADHD (attention deficit hyperactivity disorder)    Anemia    Anxiety    Asthma    Exercise Induced   Cancer ([place])    Depression    Family history of pancreatic cancer    Family history of thyroid cancer    GERD (gastroesophageal reflux disease)    Herpes simplex    Personal history of chemotherapy 08/2020    ALLERGIES:  is allergic to other.  MEDICATIONS:  Current Outpatient Medications  Medication Sig Dispense Refill   acyclovir (ZOVIRAX) 800 MG tablet Take 1 tablet (800 mg total) by mouth 2 (two) times daily as needed (flare up). 60 tablet 5   acyclovir ointment (ZOVIRAX) 5 % Apply 1  application. topically 4 (four) times daily as needed (outbreak).     albuterol (VENTOLIN HFA) 108 (90 Base) MCG/ACT inhaler Inhale 1 puff every 4 hours by inhalation route.     Calcium Carb-Cholecalciferol 500-10 MG-MCG TABS Take by mouth.     gabapentin (NEURONTIN) 300 MG capsule Take 1 capsule (300 mg total) by mouth 3 (three) times daily. 90 capsule 3   lidocaine-prilocaine (EMLA) cream      lisdexamfetamine (VYVANSE) 40 MG capsule Take 1 capsule by mouth daily. (Patient not taking: Reported on 02/20/2022)     LORazepam (ATIVAN) 0.5 MG tablet Take 1 tablet (0.5 mg total) by mouth every 8 (eight) hours as needed for anxiety. 30 tablet 1   ondansetron (ZOFRAN) 8 MG tablet Take 1 tablet (8 mg total) by mouth every 8 (eight) hours as needed for nausea or vomiting. 20 tablet 0   pantoprazole (PROTONIX) 40 MG tablet Take 1 tablet (40 mg total) by mouth daily. 30 tablet 3   prochlorperazine (COMPAZINE) 10 MG tablet Take 1 tablet (10 mg total) by mouth every 6 (six) hours as needed for nausea or vomiting. 30 tablet 0   venlafaxine XR (EFFEXOR-XR) 150 MG 24 hr capsule Take 1 capsule (150 mg total) by mouth daily with breakfast. 30 capsule 6   No current facility-administered medications for this visit.   Facility-Administered Medications Ordered in Other Visits  Medication Dose Route Frequency Provider Last Rate Last Admin   acetaminophen (TYLENOL) tablet 650 mg  650 mg Oral Once [name], MD       dexamethasone (DECADRON) 10  mg in sodium chloride 0.9 % 50 mL IVPB  10 mg Intravenous Once [name], MD       diphenhydrAMINE (BENADRYL) injection 50 mg  50 mg Intravenous Once [name], MD       famotidine (PEPCID) IVPB 20 mg premix  20 mg Intravenous Once [name], MD       fosaprepitant (EMEND) 150 mg in sodium chloride 0.9 % 145 mL IVPB  150 mg Intravenous Once [name], MD       heparin lock flush 100 unit/mL  500 Units Intracatheter Once PRN [name], MD        palonosetron (ALOXI) injection 0.25 mg  0.25 mg Intravenous Once [name], MD       sacituzumab govitecan-hziy (TRODELVY) 670 mg in sodium chloride 0.9 % 250 mL (2.1136 mg/mL) chemo infusion  10 mg/kg (Treatment Plan Recorded) Intravenous Once [name], MD       sodium chloride flush (NS) 0.9 % injection 10 mL  10 mL Intracatheter PRN [name], MD        VITAL SIGNS: There were no vitals taken for this visit. There were no vitals filed for this visit.  Estimated body mass index is 24.2 kg/m as calculated from the following:   Height as of 02/12/22: '5\' 5"'$  (1.651 m).   Weight as of an earlier encounter on 02/21/22: 145 lb 7 oz (66 kg).   PERFORMANCE STATUS (ECOG) : 1 - Symptomatic but completely ambulatory   Physical Exam General: NAD Cardiovascular: regular rate and rhythm Pulmonary: normal breathing pattern  Extremities: no edema, no joint deformities Skin: no rashes Neurological: AAO x3, mood appropriate   IMPRESSION: I saw Ellen [name] during her infusion. No acute distress noted. Resting comfortably in recliner. Continues to try to remain as active as possible. Remaining hopeful for some improvement/stability with new treatment.   We discussed her breast pain. She has increased her gabapentin to '600mg'$  three times daily and tolerated well. Denies any side effects. Expresses her appreciation in her improvement. Reports significant decrease in neuropathic pain. Well controlled on current regimen. No changes needed at this time.   Ellen [name] shares she is still living with her sister. 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 Continues to take life one day at a time and lean on family and friend support.   I discussed the importance of continued conversation with family and their medical providers regarding overall plan of care and treatment options, ensuring decisions are within the context of the patients values and GOCs.  PLAN: Established therapeutic  relationship. Education provided on palliative's role in collaboration with their Oncology/Radiation team. Ongoing goals of care and support as needed Gabapentin '300mg'$  three times daily. Tolerating well. Pain improved.  I will plan to see patient back in 2-3 weeks in collaboration to other oncology appointments.    Patient expressed understanding and was in agreement with this plan. She also understands that She can call the clinic at any time with any questions, concerns, or complaints.   Any controlled substances utilized were prescribed in the context of palliative care. PDMP has been reviewed.   Time Total: 20 min  Visit consisted of counseling and education dealing with the complex and emotionally intense issues of symptom management and palliative care in the setting of serious and potentially life-threatening illness.Greater than 50%  of this time was spent counseling and coordinating care related to the above assessment and plan.  Alda Lea, AGPCNP-BC  Palliative  [place]

## 2022-02-25 ENCOUNTER — Inpatient Hospital Stay: Payer: Medicaid Other

## 2022-02-25 ENCOUNTER — Other Ambulatory Visit (HOSPITAL_COMMUNITY): Payer: Self-pay

## 2022-02-25 ENCOUNTER — Telehealth: Payer: Self-pay

## 2022-02-25 ENCOUNTER — Inpatient Hospital Stay (HOSPITAL_BASED_OUTPATIENT_CLINIC_OR_DEPARTMENT_OTHER): Payer: Medicaid Other | Admitting: Adult Health

## 2022-02-25 ENCOUNTER — Other Ambulatory Visit: Payer: Self-pay

## 2022-02-25 ENCOUNTER — Encounter: Payer: Self-pay | Admitting: Adult Health

## 2022-02-25 ENCOUNTER — Other Ambulatory Visit: Payer: Self-pay | Admitting: *Deleted

## 2022-02-25 VITALS — BP 106/68 | HR 93 | Temp 97.7°F | Resp 15 | Wt 140.3 lb

## 2022-02-25 DIAGNOSIS — C50919 Malignant neoplasm of unspecified site of unspecified female breast: Secondary | ICD-10-CM

## 2022-02-25 DIAGNOSIS — E860 Dehydration: Secondary | ICD-10-CM

## 2022-02-25 DIAGNOSIS — C7951 Secondary malignant neoplasm of bone: Secondary | ICD-10-CM | POA: Diagnosis not present

## 2022-02-25 DIAGNOSIS — C50912 Malignant neoplasm of unspecified site of left female breast: Secondary | ICD-10-CM

## 2022-02-25 DIAGNOSIS — R110 Nausea: Secondary | ICD-10-CM

## 2022-02-25 DIAGNOSIS — Z5111 Encounter for antineoplastic chemotherapy: Secondary | ICD-10-CM | POA: Diagnosis not present

## 2022-02-25 DIAGNOSIS — Z95828 Presence of other vascular implants and grafts: Secondary | ICD-10-CM

## 2022-02-25 DIAGNOSIS — T451X5A Adverse effect of antineoplastic and immunosuppressive drugs, initial encounter: Secondary | ICD-10-CM

## 2022-02-25 LAB — CBC WITH DIFFERENTIAL (CANCER CENTER ONLY)
Abs Immature Granulocytes: 0.01 10*3/uL (ref 0.00–0.07)
Basophils Absolute: 0 10*3/uL (ref 0.0–0.1)
Basophils Relative: 0 %
Eosinophils Absolute: 0 10*3/uL (ref 0.0–0.5)
Eosinophils Relative: 2 %
HCT: 32.5 % — ABNORMAL LOW (ref 36.0–46.0)
Hemoglobin: 11.2 g/dL — ABNORMAL LOW (ref 12.0–15.0)
Immature Granulocytes: 1 %
Lymphocytes Relative: 32 %
Lymphs Abs: 0.5 10*3/uL — ABNORMAL LOW (ref 0.7–4.0)
MCH: 31.9 pg (ref 26.0–34.0)
MCHC: 34.5 g/dL (ref 30.0–36.0)
MCV: 92.6 fL (ref 80.0–100.0)
Monocytes Absolute: 0 10*3/uL — ABNORMAL LOW (ref 0.1–1.0)
Monocytes Relative: 1 %
Neutro Abs: 0.9 10*3/uL — ABNORMAL LOW (ref 1.7–7.7)
Neutrophils Relative %: 64 %
Platelet Count: 185 10*3/uL (ref 150–400)
RBC: 3.51 MIL/uL — ABNORMAL LOW (ref 3.87–5.11)
RDW: 14.6 % (ref 11.5–15.5)
WBC Count: 1.5 10*3/uL — ABNORMAL LOW (ref 4.0–10.5)
nRBC: 0 % (ref 0.0–0.2)

## 2022-02-25 LAB — CMP (CANCER CENTER ONLY)
ALT: 16 U/L (ref 0–44)
AST: 25 U/L (ref 15–41)
Albumin: 4 g/dL (ref 3.5–5.0)
Alkaline Phosphatase: 71 U/L (ref 38–126)
Anion gap: 6 (ref 5–15)
BUN: 10 mg/dL (ref 6–20)
CO2: 24 mmol/L (ref 22–32)
Calcium: 8.1 mg/dL — ABNORMAL LOW (ref 8.9–10.3)
Chloride: 106 mmol/L (ref 98–111)
Creatinine: 0.58 mg/dL (ref 0.44–1.00)
GFR, Estimated: >60 mL/min (ref >60)
Glucose, Bld: 101 mg/dL — ABNORMAL HIGH (ref 70–99)
Potassium: 4.1 mmol/L (ref 3.5–5.1)
Sodium: 136 mmol/L (ref 135–145)
Total Bilirubin: 0.4 mg/dL (ref 0.3–1.2)
Total Protein: 7 g/dL (ref 6.5–8.1)

## 2022-02-25 LAB — MAGNESIUM: Magnesium: 2.5 mg/dL — ABNORMAL HIGH (ref 1.7–2.4)

## 2022-02-25 MED ORDER — HEPARIN SOD (PORK) LOCK FLUSH 100 UNIT/ML IV SOLN
500.0000 [IU] | Freq: Once | INTRAVENOUS | Status: AC
  Administered 2022-02-25: 500 [IU]

## 2022-02-25 MED ORDER — ONDANSETRON 8 MG PO TBDP
8.0000 mg | ORAL_TABLET | Freq: Three times a day (TID) | ORAL | 0 refills | Status: DC | PRN
  Filled 2022-02-25: qty 20, 7d supply, fill #0

## 2022-02-25 MED ORDER — SODIUM CHLORIDE 0.9% FLUSH
10.0000 mL | Freq: Once | INTRAVENOUS | Status: AC
  Administered 2022-02-25: 10 mL

## 2022-02-25 MED ORDER — ONDANSETRON HCL 4 MG/2ML IJ SOLN
8.0000 mg | Freq: Once | INTRAMUSCULAR | Status: AC
  Administered 2022-02-25: 8 mg via INTRAVENOUS

## 2022-02-25 MED ORDER — SODIUM CHLORIDE 0.9 % IV SOLN: 8.0000 mg | Freq: Once | INTRAVENOUS | Status: DC

## 2022-02-25 MED ORDER — SODIUM CHLORIDE 0.9 % IV SOLN: INTRAVENOUS | Status: AC

## 2022-02-25 MED ORDER — SODIUM CHLORIDE 0.9 % IV SOLN: Freq: Once | INTRAVENOUS | Status: AC

## 2022-02-25 MED ORDER — LORAZEPAM 0.5 MG PO TABS
0.5000 mg | ORAL_TABLET | Freq: Three times a day (TID) | ORAL | 0 refills | Status: DC | PRN
  Filled 2022-02-25 – 2022-03-21 (×2): qty 45, 15d supply, fill #0

## 2022-02-25 MED ORDER — SODIUM CHLORIDE 0.9 % IV SOLN: Freq: Once | INTRAVENOUS | Status: DC

## 2022-02-25 MED ORDER — ONDANSETRON HCL 4 MG/2ML IJ SOLN
8.0000 mg | Freq: Once | INTRAMUSCULAR | Status: AC
  Filled 2022-02-25: qty 4
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
## 2022-02-25 NOTE — Telephone Encounter (Signed)
Called Pt to confirm appts for lab/NP/IVF today, Pt aware and verbalized understanding.

## 2022-02-25 NOTE — Assessment & Plan Note (Signed)
Ellen [name] is a 27 year-old woman with metastatic breast cancer cycle 1 day 78 of [place].    She is increasingly nauseated and dehydrated. She will receive 1 liter of IV fluids and IV Zofran today.  I sent in refills of her Lorazepam and Ondansetron.  We discussed that with day 8 of each cycle she could receive IV fluids to help prevent any nausea or dehydration.    Ellen [name] will call me tomorrow morning and let me know how she is feeling and if she thinks she needs any additional days of IV fluids.

## 2022-02-26 ENCOUNTER — Other Ambulatory Visit: Payer: Self-pay | Admitting: Adult Health

## 2022-02-26 ENCOUNTER — Telehealth: Payer: Self-pay

## 2022-02-26 ENCOUNTER — Encounter: Payer: Self-pay | Admitting: Adult Health

## 2022-02-26 ENCOUNTER — Other Ambulatory Visit (HOSPITAL_COMMUNITY): Payer: Self-pay

## 2022-02-26 ENCOUNTER — Other Ambulatory Visit: Payer: Self-pay

## 2022-02-26 DIAGNOSIS — C50912 Malignant neoplasm of unspecified site of left female breast: Secondary | ICD-10-CM

## 2022-02-26 DIAGNOSIS — K909 Intestinal malabsorption, unspecified: Secondary | ICD-10-CM

## 2022-02-26 MED ORDER — DIPHENOXYLATE-ATROPINE 2.5-0.025 MG PO TABS
1.0000 | ORAL_TABLET | Freq: Four times a day (QID) | ORAL | 0 refills | Status: DC | PRN
  Filled 2022-02-26: qty 30, 8d supply, fill #0

## 2022-02-26 NOTE — Telephone Encounter (Signed)
Called regarding mychart message. Given appt for 8 am tomorrow for IV fluids at [facility]. Told her Wilber Bihari, NP is sending lomotil to her pharmacy. She verbalized understanding and appreciated the call.

## 2022-02-27 ENCOUNTER — Inpatient Hospital Stay: Payer: Medicaid Other

## 2022-02-27 ENCOUNTER — Encounter: Payer: Self-pay | Admitting: Adult Health

## 2022-02-27 DIAGNOSIS — Z5111 Encounter for antineoplastic chemotherapy: Secondary | ICD-10-CM | POA: Diagnosis not present

## 2022-02-27 DIAGNOSIS — C50912 Malignant neoplasm of unspecified site of left female breast: Secondary | ICD-10-CM

## 2022-02-27 MED ORDER — SODIUM CHLORIDE 0.9% FLUSH
10.0000 mL | Freq: Once | INTRAVENOUS | Status: AC
  Administered 2022-02-27: 10 mL via INTRAVENOUS

## 2022-02-27 MED ORDER — HEPARIN SOD (PORK) LOCK FLUSH 100 UNIT/ML IV SOLN
500.0000 [IU] | Freq: Once | INTRAVENOUS | Status: AC
  Administered 2022-02-27: 500 [IU] via INTRAVENOUS

## 2022-02-27 MED ORDER — SODIUM CHLORIDE 0.9 % IV SOLN: INTRAVENOUS | Status: AC

## 2022-02-27 NOTE — Patient Instructions (Signed)

## 2022-02-28 ENCOUNTER — Telehealth: Payer: Self-pay

## 2022-02-28 ENCOUNTER — Other Ambulatory Visit (HOSPITAL_COMMUNITY): Payer: Self-pay

## 2022-02-28 NOTE — Telephone Encounter (Signed)
Called Pt regarding MyChart message. Discussed with Pt that per my phone call with [facility] pharmacy, they did not fill ativan rx because Gudena sent a 10 day supply of ativan rx to a [facility] in [place] on 1/6. Pt states she was not aware of rx sent to [facility]. Pt stated she is now in [place] and asked if we could send ativan rx there.   Called [facility] in [place], pharmacy states that the ativan rx was picked up on 1/6 by Pt.  Called Pt to relay above message, Pt states she "must have gotten her dates mixed up". Pt verbalized understanding that ativan cannot be reordered at this time.

## 2022-03-06 MED FILL — Dexamethasone Sodium Phosphate Inj 100 MG/10ML: INTRAMUSCULAR | Qty: 1 | Status: AC

## 2022-03-06 MED FILL — Fosaprepitant Dimeglumine For IV Infusion 150 MG (Base Eq): INTRAVENOUS | Qty: 5 | Status: AC

## 2022-03-07 ENCOUNTER — Inpatient Hospital Stay: Payer: Medicaid Other

## 2022-03-07 ENCOUNTER — Other Ambulatory Visit: Payer: Self-pay | Admitting: Hematology and Oncology

## 2022-03-07 ENCOUNTER — Inpatient Hospital Stay (HOSPITAL_BASED_OUTPATIENT_CLINIC_OR_DEPARTMENT_OTHER): Payer: Medicaid Other | Admitting: Hematology and Oncology

## 2022-03-07 VITALS — BP 140/81 | HR 96 | Temp 98.1°F | Resp 16 | Wt 142.0 lb

## 2022-03-07 DIAGNOSIS — C7951 Secondary malignant neoplasm of bone: Secondary | ICD-10-CM | POA: Diagnosis not present

## 2022-03-07 DIAGNOSIS — C50919 Malignant neoplasm of unspecified site of unspecified female breast: Secondary | ICD-10-CM

## 2022-03-07 DIAGNOSIS — Z95828 Presence of other vascular implants and grafts: Secondary | ICD-10-CM

## 2022-03-07 DIAGNOSIS — C50912 Malignant neoplasm of unspecified site of left female breast: Secondary | ICD-10-CM | POA: Diagnosis not present

## 2022-03-07 DIAGNOSIS — Z5111 Encounter for antineoplastic chemotherapy: Secondary | ICD-10-CM | POA: Diagnosis not present

## 2022-03-07 LAB — CMP (CANCER CENTER ONLY)
ALT: 19 U/L (ref 0–44)
AST: 19 U/L (ref 15–41)
Albumin: 3.8 g/dL (ref 3.5–5.0)
Alkaline Phosphatase: 64 U/L (ref 38–126)
Anion gap: 8 (ref 5–15)
BUN: 11 mg/dL (ref 6–20)
CO2: 24 mmol/L (ref 22–32)
Calcium: 9.1 mg/dL (ref 8.9–10.3)
Chloride: 108 mmol/L (ref 98–111)
Creatinine: 0.64 mg/dL (ref 0.44–1.00)
GFR, Estimated: >60 mL/min (ref >60)
Glucose, Bld: 113 mg/dL — ABNORMAL HIGH (ref 70–99)
Potassium: 3.8 mmol/L (ref 3.5–5.1)
Sodium: 140 mmol/L (ref 135–145)
Total Bilirubin: 0.4 mg/dL (ref 0.3–1.2)
Total Protein: 6.4 g/dL — ABNORMAL LOW (ref 6.5–8.1)

## 2022-03-07 LAB — CBC WITH DIFFERENTIAL (CANCER CENTER ONLY)
Abs Immature Granulocytes: 0 10*3/uL (ref 0.00–0.07)
Basophils Absolute: 0 10*3/uL (ref 0.0–0.1)
Basophils Relative: 1 %
Eosinophils Absolute: 0.2 10*3/uL (ref 0.0–0.5)
Eosinophils Relative: 9 %
HCT: 33.2 % — ABNORMAL LOW (ref 36.0–46.0)
Hemoglobin: 11.2 g/dL — ABNORMAL LOW (ref 12.0–15.0)
Immature Granulocytes: 0 %
Lymphocytes Relative: 39 %
Lymphs Abs: 0.8 10*3/uL (ref 0.7–4.0)
MCH: 31.5 pg (ref 26.0–34.0)
MCHC: 33.7 g/dL (ref 30.0–36.0)
MCV: 93.5 fL (ref 80.0–100.0)
Monocytes Absolute: 0.2 10*3/uL (ref 0.1–1.0)
Monocytes Relative: 11 %
Neutro Abs: 0.8 10*3/uL — ABNORMAL LOW (ref 1.7–7.7)
Neutrophils Relative %: 40 %
Platelet Count: 156 10*3/uL (ref 150–400)
RBC: 3.55 MIL/uL — ABNORMAL LOW (ref 3.87–5.11)
RDW: 14.5 % (ref 11.5–15.5)
WBC Count: 2.1 10*3/uL — ABNORMAL LOW (ref 4.0–10.5)
nRBC: 0 % (ref 0.0–0.2)

## 2022-03-07 LAB — PHOSPHORUS: Phosphorus: 3.9 mg/dL (ref 2.5–4.6)

## 2022-03-07 LAB — MAGNESIUM: Magnesium: 1.8 mg/dL (ref 1.7–2.4)

## 2022-03-07 MED ORDER — SODIUM CHLORIDE 0.9% FLUSH
10.0000 mL | Freq: Once | INTRAVENOUS | Status: AC
  Administered 2022-03-07: 10 mL

## 2022-03-07 NOTE — Progress Notes (Signed)
Added 500 ml NS on day 8 Udenyca on day 9

## 2022-03-07 NOTE — Assessment & Plan Note (Signed)
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, FISH positive ratio 2.8, Ki-67 25%, lymph node positive   Palliative radiation completed 09/03/2020 Taxotere/Herceptin/Perjeta 08/31/2020-12/18/2020 Herceptin Perjeta Maintenance  beginning 01/08/2021-08/27/2021 Enhertu: 09/17/2021-10/29/2021: Discontinued for progression Carboplatin and gemcitabine 11/19/2021-01/15/2022: Discontinued for progression   Biopsy of the breast at [facility] on 08/26/2021: IDC with lobular features ER 90%, PR 0%, HER2 1+ (low) ------------------------------------------------------------------------------------------------------------------------------------ Current treatment: Trodelvy started 02/14/2022, today cycle 2-day 1 Trodelvy toxicities:  Return to clinic days 1 and 8 every 3 weeks for [place].

## 2022-03-07 NOTE — Progress Notes (Signed)
Patient Care Team: Pcp, No as PCP - General [name], MD as Consulting Physician (Hematology and Oncology) [name], [name], NP as Nurse Practitioner (Hematology and Oncology) [name], MD as Consulting Physician (General Surgery) [name], MD as Consulting Physician (Radiation Oncology) [name], [name], NP as Nurse Practitioner (Nurse Practitioner) [name], [name], NP as Nurse Practitioner (Nurse Practitioner)  DIAGNOSIS:  Encounter Diagnoses  Name Primary?   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  Prominent left axillary lymph node cluster of adjacent lymph nodes, irregular nodule right middle lobe 1.1 cm, aggressive lesion right second rib cortical destruction, multiple lucent lesions throughout the thoracic spine including T1 vertebral body and T2, irregular multilobar lesion central left hepatic lobe 4.3 x 4.7 cm.  Several satellite lesions in the left lateral hepatic lobe.  Multiple sclerotic lesions in the bones iliac wings, acetabular, medial iliac bones, sacrum multiple lesions lumbar spine L3-L4 and L5   08/17/2020 Initial Diagnosis   Biopsy revealed IDC with DCIS grade 2, ER 30%, PR 20%, HER2 equivocal by IHC, FISH positive, Ki-67 25%, lymph node positive    Genetic Testing   Negative genetic testing:  No pathogenic variants detected on the [employer] CancerNext-Expanded + RNAinsight panel. The report date is 09/06/2020.   The CancerNext-Expanded + RNAinsight gene panel offered by [employer] and includes sequencing and rearrangement analysis for the following 77 genes: AIP, ALK, APC, ATM, AXIN2, BAP1, BARD1, BLM, BMPR1A, BRCA1, BRCA2, BRIP1, CDC73, CDH1, CDK4, CDKN1B,  CDKN2A, CHEK2, CTNNA1, DICER1, FANCC, FH, FLCN, GALNT12, KIF1B, LZTR1, MAX, MEN1, MET, MLH1, MSH2, MSH3, MSH6, MUTYH, NBN, NF1, NF2, NTHL1, PALB2, PHOX2B, PMS2, POT1, PRKAR1A, PTCH1, PTEN, RAD51C, RAD51D, RB1, RECQL, RET, SDHA, SDHAF2, SDHB, SDHC, SDHD, SMAD4, SMARCA4, SMARCB1, SMARCE1, STK11, SUFU, TMEM127, TP53, TSC1, TSC2, VHL and XRCC2 (sequencing and deletion/duplication); EGFR, EGLN1, HOXB13, KIT, MITF, PDGFRA, POLD1 and POLE (sequencing only); EPCAM and GREM1 (deletion/duplication only). RNA data is routinely analyzed for use in variant interpretation for all genes.   09/17/2021 - 10/07/2021 Chemotherapy   Patient is on Treatment Plan : BREAST METASTATIC fam-trastuzumab deruxtecan-nxki (Enhertu) q21d     09/17/2021 - 10/29/2021 Chemotherapy   Patient is on Treatment Plan : BREAST METASTATIC Fam-Trastuzumab Deruxtecan-nxki (Enhertu) (5.4) q21d     11/19/2021 - 01/15/2022 Chemotherapy   Patient is on Treatment Plan : BREAST Carboplatin + Gemcitabine D1,8 q21d     02/14/2022 -  Chemotherapy   Patient is on Treatment Plan : BREAST METASTATIC Sacituzumab govitecan-hziy [name]) D1,8 q21d     Carcinoma of breast metastatic to bone ([place])  08/17/2020 Initial Diagnosis   Carcinoma of breast metastatic to bone ([place]); Goserelin/Zoladex started and to be given every 4 weeks    Genetic Testing   Negative genetic testing:  No pathogenic variants detected on the [employer] CancerNext-Expanded + RNAinsight panel. The report date is 09/06/2020.   The CancerNext-Expanded + RNAinsight gene panel offered by [employer] and includes sequencing and rearrangement analysis for the following 77 genes: AIP, ALK, APC, ATM, AXIN2, BAP1, BARD1, BLM, BMPR1A, BRCA1, BRCA2, BRIP1, CDC73, CDH1, CDK4, CDKN1B, CDKN2A, CHEK2, CTNNA1, DICER1, FANCC, FH, FLCN, GALNT12, KIF1B, LZTR1, MAX, MEN1, MET, MLH1, MSH2, MSH3, MSH6, MUTYH, NBN, NF1, NF2, NTHL1, PALB2, PHOX2B, PMS2, POT1, PRKAR1A,  PTCH1, PTEN, RAD51C, RAD51D, RB1, RECQL, RET,  SDHA, SDHAF2, SDHB, SDHC, SDHD, SMAD4, SMARCA4, SMARCB1, SMARCE1, STK11, SUFU, TMEM127, TP53, TSC1, TSC2, VHL and XRCC2 (sequencing and deletion/duplication); EGFR, EGLN1, HOXB13, KIT, MITF, PDGFRA, POLD1 and POLE (sequencing only); EPCAM and GREM1 (deletion/duplication only). RNA data is routinely analyzed for use in variant interpretation for all genes.   08/18/2020 - 08/30/2020 Radiation Therapy   Palliative radiation to the lumbar and pelvic bone; 30 Gy in 10 fractions   08/31/2020 - 12/18/2020 Adjuvant Chemotherapy   Started with Herceptin/Perjeta on 08/31/2020 and Taxotere on 09/04/2020; she was hospitalized with dehydration and refractory diarrhea for 11 days, she resumed treatment with Taxotere (dose reduced by 54m/m2) and Herceptin only (perjeta discontinued) on 09/26/2020, reintroduced 10/17/2020   09/03/2020 Cancer Staging   Staging form: Breast, AJCC 8th Edition - Clinical stage from 09/03/2020: Stage IV (cT4b, cN1, cM1, G2, ER+, PR+, HER2+) - Signed by CGardenia Phlegm NP on 10/03/2020 Stage prefix: Initial diagnosis Method of lymph node assessment: Clinical Histologic grading system: 3 grade system   01/08/2021 - 08/27/2021 Adjuvant Chemotherapy   Herceptin/Perjeta every 3 weeks, Zometa every 12 weeks added 01/29/2021   07/07/2021 PET scan   1. Interval decrease in size of the dominant left breast mass which now exhibits mild low level FDG uptake with SUV max of 2.7. Small nodule within the left breast exhibits moderate tracer uptake within SUV max of 5.6 and is suspicious for residual/recurrent local tumor. 2. Mild tracer uptake associated with prominent left axillary lymph node with SUV max of 3.35. Residual tracer avid nodal metastasis not excluded. 3. The previously noted liver metastasis are no longer visible on the CT images from today's study. There is no abnormal increased uptake within the left hepatic lobe above background liver activity to suggest metabolically  active tumor metastasis. 4. Interval increase in sclerosis associated with previously noted lytic bone metastasis without significant increased uptake above background bone marrow activity compatible with healing multifocal bone metastasis. 5. New pathologic fracture involves the L3 vertebral body where there was extensive lytic changes noted on the previous CT. The collapsed vertebral body is now sclerotic with mild low level FDG uptake. 6. Similar appearance of soft tissue infiltration within the anterior mediastinal fat which exhibits mild FDG uptake on today's study. 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   07/16/2021 Surgery   S/p BSO, benign pathology    08/26/2021 Mammogram   Mammo diagnostic breast tomosynthesis left: There is an additional  irregular mass in the lower inner position at posterior depth. This was  further evaluated with ultrasound.   Mammo UKoreabreast limited left: There is a 0.7 cm x 0.5 cm x 0.7 cm  irregularly shaped, hypoechoic mass with indistinct margins seen in the  left breast at 8 o'clock, 8 cm from the nipple. This mass is 1.6 cm medial  to the palpable mass as detailed above.    08/26/2021 Pathology Results   New left breast mass 8:00 biopsy: Invasive carcinoma with mixed ductal and lobular and focal micropapillary features.  Grade 3, invasive carcinoma measures 9 mm with an associated lymphoid reaction.  ER negative PR negative HER2 1+.  Left breast mass 2:00 biopsy: Invasive carcinoma with mixed ductal and lobular features Nottingham grade 2 measuring 1.4 cm.  ER 9% (low positive), PR negative, HER2 1+.   09/16/2021 PET scan   1. Interval progression of hypermetabolic metastatic disease. 2. Hypermetabolic bilateral axillary lymphadenopathy, subcarinal and bilateral hilar lymphadenopathy, new/progressive. 3. New hypermetabolic  nodularity in the lower left breast is compatible with progressive malignancy. 4. New hypermetabolic mixed  lytic and sclerotic left pelvic bone metastases as detailed. 5. Persistent patchy hypermetabolism in the anterior mediastinum associated with mixed soft tissue and fat density, favor thymic hyperplasia.   09/17/2021 - 10/07/2021 Chemotherapy   Patient is on Treatment Plan : BREAST METASTATIC fam-trastuzumab deruxtecan-nxki (Enhertu) q21d     09/17/2021 - 10/29/2021 Chemotherapy   Patient is on Treatment Plan : BREAST METASTATIC Fam-Trastuzumab Deruxtecan-nxki (Enhertu) (5.4) q21d     11/11/2021 PET scan    1. Hypermetabolic left breast soft tissue and skin thickening is increased  from prior examination and suspicious for worsening primary malignancy.   2. There is evidence of interval worsening hypermetabolic adenopathy.  Primary differential consideration is worsening nodal metastasis, however  given the distribution the possibility of sarcoid-like reaction in the  setting of therapy is raised. There is worsening hypermetabolic left  axillary adenopathy with new areas of hypermetabolic adenopathy seen in the  left subpectoral, internal mammary, cardiophrenic, right axillary,  mediastinal, and bilateral hilar nodal stations. A few nonspecific FDG avid  nonenlarged left pelvic/inguinal nodes are also seen. Tissue sampling  should be considered for definitive assessment, otherwise close follow-up  is recommended.   3. Interval increased hypermetabolic activity associated with osseous  metastasis in the left hemipelvis. Other nonavid sclerotic bone lesions may  reflect sites of treated disease.   Electronically Reviewed by:  Mahala Menghini, MD, [facility]  Electronically Reviewed on:  11/11/2021 6:13 PM    11/19/2021 - 01/15/2022 Chemotherapy   Patient is on Treatment Plan : BREAST Carboplatin + Gemcitabine D1,8 q21d     02/14/2022 -  Chemotherapy   Patient is on Treatment Plan : BREAST METASTATIC Sacituzumab govitecan-hziy [name]) D1,8 q21d       CHIEF COMPLIANT: Cycle 2-day 1  [name]  INTERVAL HISTORY: Ellen [name] is a  27 y.o female history of metastatic breast cancer was currently on chemotherapy with [name]. She presents to the clinic for a follow-up for cycle 2-day 1.  She tells me after the last cycle of chemotherapy she felt nauseated for 2 to 3 days she also had diarrhea.  She felt very sick and she had to be brought in for some IV fluids.  She felt significantly better after the fluids.  Did not have any fevers or chills.  The diarrhea lasted for 2 to 3 days and it went away.  She was prescribed Lomotil but she did not need to take it.     ALLERGIES:  is allergic to other.  MEDICATIONS:  Current Outpatient Medications  Medication Sig Dispense Refill   diphenoxylate-atropine (LOMOTIL) 2.5-0.025 MG tablet Take 1 tablet by mouth 4 (four) times daily as needed for diarrhea or loose stools. 30 tablet 0   acyclovir (ZOVIRAX) 800 MG tablet Take 1 tablet (800 mg total) by mouth 2 (two) times daily as needed (flare up). (Patient not taking: Reported on 02/25/2022) 60 tablet 5   acyclovir ointment (ZOVIRAX) 5 % Apply 1 application. topically 4 (four) times daily as needed (outbreak). (Patient not taking: Reported on 02/25/2022)     albuterol (VENTOLIN HFA) 108 (90 Base) MCG/ACT inhaler Inhale 1 puff every 4 hours by inhalation route. (Patient not taking: Reported on 02/25/2022)     Calcium Carb-Cholecalciferol 500-10 MG-MCG TABS Take by mouth. (Patient not taking: Reported on 02/25/2022)     gabapentin (NEURONTIN) 300 MG capsule Take 1 capsule (300 mg total) by mouth 3 (  three) times daily. 90 capsule 3   lidocaine-prilocaine (EMLA) cream      lisdexamfetamine (VYVANSE) 40 MG capsule Take 1 capsule by mouth daily. (Patient not taking: Reported on 02/20/2022)     LORazepam (ATIVAN) 0.5 MG tablet Take 1 tablet (0.5 mg total) by mouth every 8 (eight) hours as needed for anxiety. 45 tablet 0   ondansetron (ZOFRAN-ODT) 8 MG disintegrating tablet Dissolve 1 tablet (8 mg total)  in mouth every 8 (eight) hours as needed for nausea or vomiting. 20 tablet 0   pantoprazole (PROTONIX) 40 MG tablet Take 1 tablet (40 mg total) by mouth daily. (Patient not taking: Reported on 02/25/2022) 30 tablet 3   prochlorperazine (COMPAZINE) 10 MG tablet Take 1 tablet (10 mg total) by mouth every 6 (six) hours as needed for nausea or vomiting. 30 tablet 0   venlafaxine XR (EFFEXOR-XR) 150 MG 24 hr capsule Take 1 capsule (150 mg total) by mouth daily with breakfast. 30 capsule 6   No current facility-administered medications for this visit.    PHYSICAL EXAMINATION: ECOG PERFORMANCE STATUS: 1 - Symptomatic but completely ambulatory  Vitals:   03/07/22 1001  BP: (!) 140/81  Pulse: 96  Resp: 16  Temp: 98.1 F (36.7 C)  SpO2: 100%   Filed Weights   03/07/22 1001  Weight: 142 lb (64.4 kg)      LABORATORY DATA:  I have reviewed the data as listed    Latest Ref Rng & Units 02/25/2022    9:44 AM 02/20/2022    9:47 AM 02/14/2022    9:00 AM  CMP  Glucose 70 - 99 mg/dL 101  99  115   BUN 6 - 20 mg/dL '10  14  9   '$ Creatinine 0.44 - 1.00 mg/dL 0.58  0.59  0.63   Sodium 135 - 145 mmol/L 136  138  139   Potassium 3.5 - 5.1 mmol/L 4.1  3.9  4.3   Chloride 98 - 111 mmol/L 106  106  107   CO2 22 - 32 mmol/L '24  27  27   '$ Calcium 8.9 - 10.3 mg/dL 8.1  9.0  9.6   Total Protein 6.5 - 8.1 g/dL 7.0  6.3  6.6   Total Bilirubin 0.3 - 1.2 mg/dL 0.4  0.2  0.4   Alkaline Phos 38 - 126 U/L 71  69  67   AST 15 - 41 U/L '25  22  22   '$ ALT 0 - 44 U/L '16  17  11     '$ Lab Results  Component Value Date   WBC 1.5 (L) 02/25/2022   HGB 11.2 (L) 02/25/2022   HCT 32.5 (L) 02/25/2022   MCV 92.6 02/25/2022   PLT 185 02/25/2022   NEUTROABS 0.9 (L) 02/25/2022    ASSESSMENT & PLAN:  Primary malignant neoplasm of breast with metastasis ([place]) 08/15/2020: CT CAP: Breast mass 3.5 cm, left axillary lymph node, multiple bone metastases, extensive liver metastases, small lung nodules   Biopsy revealed IDC with  DCIS grade 2, ER 30%, PR 20%, HER2 equivocal by IHC, FISH positive ratio 2.8, Ki-67 25%, lymph node positive   Palliative radiation completed 09/03/2020 Taxotere/Herceptin/Perjeta 08/31/2020-12/18/2020 Herceptin Perjeta Maintenance  beginning 01/08/2021-08/27/2021 Enhertu: 09/17/2021-10/29/2021: Discontinued for progression Carboplatin and gemcitabine 11/19/2021-01/15/2022: Discontinued for progression   Biopsy of the breast at [facility] on 08/26/2021: IDC with lobular features ER 90%, PR 0%, HER2 1+ (low) ------------------------------------------------------------------------------------------------------------------------------------ Current treatment: Trodelvy started 02/14/2022, today cycle 2-day 1 (postponing treatment by 1 week) [name]  toxicities: Neutropenia: We have to hold today's treatment and postpone till next week. Nausea and vomiting: Patient was prescribed Zofran it was very severe for 3 days after chemo. Diarrhea: Resolved in 1 to 2 days with the Lomotil.  Our plan is to administer Neulasta on day 9 of each cycle She will also need IV fluids on day 8  Return to clinic days 1 and 8 every 3 weeks for Trodelvy.    No orders of the defined types were placed in this encounter.  The patient has a good understanding of the overall plan. she agrees with it. she will call with any problems that may develop before the next visit here. Total time spent: 30 mins including face to face time and time spent for planning, charting and co-ordination of care   Harriette Ohara, MD 03/07/22    I Gardiner Coins am acting as a Education administrator for [employer]  I have reviewed the above documentation for accuracy and completeness, and I agree with the above.

## 2022-03-10 ENCOUNTER — Telehealth: Payer: Self-pay | Admitting: Hematology and Oncology

## 2022-03-10 ENCOUNTER — Encounter: Payer: Self-pay | Admitting: General Practice

## 2022-03-10 NOTE — Progress Notes (Signed)
[facility] Spiritual Care Note  Attempted follow-up call for emotional support; left voicemail encouraging return call.   [place], [place], [facility] Pager [phone] Voicemail [phone]

## 2022-03-10 NOTE — Telephone Encounter (Signed)
Scheduled appointments per WQ. Left voicemail.  

## 2022-03-13 MED FILL — Fosaprepitant Dimeglumine For IV Infusion 150 MG (Base Eq): INTRAVENOUS | Qty: 5 | Status: AC

## 2022-03-13 MED FILL — Dexamethasone Sodium Phosphate Inj 100 MG/10ML: INTRAMUSCULAR | Qty: 1 | Status: AC

## 2022-03-14 ENCOUNTER — Other Ambulatory Visit: Payer: Self-pay

## 2022-03-14 ENCOUNTER — Encounter: Payer: Self-pay | Admitting: Adult Health

## 2022-03-14 ENCOUNTER — Inpatient Hospital Stay: Payer: Medicaid Other

## 2022-03-14 ENCOUNTER — Inpatient Hospital Stay: Payer: Medicaid Other | Admitting: Nurse Practitioner

## 2022-03-14 ENCOUNTER — Inpatient Hospital Stay (HOSPITAL_BASED_OUTPATIENT_CLINIC_OR_DEPARTMENT_OTHER): Payer: Medicaid Other | Admitting: Adult Health

## 2022-03-14 VITALS — BP 98/54 | HR 85 | Temp 98.1°F | Resp 17

## 2022-03-14 DIAGNOSIS — C50919 Malignant neoplasm of unspecified site of unspecified female breast: Secondary | ICD-10-CM | POA: Diagnosis not present

## 2022-03-14 DIAGNOSIS — C50912 Malignant neoplasm of unspecified site of left female breast: Secondary | ICD-10-CM | POA: Diagnosis not present

## 2022-03-14 DIAGNOSIS — Z95828 Presence of other vascular implants and grafts: Secondary | ICD-10-CM

## 2022-03-14 DIAGNOSIS — C7951 Secondary malignant neoplasm of bone: Secondary | ICD-10-CM

## 2022-03-14 DIAGNOSIS — Z5111 Encounter for antineoplastic chemotherapy: Secondary | ICD-10-CM | POA: Diagnosis not present

## 2022-03-14 LAB — PHOSPHORUS: Phosphorus: 3.3 mg/dL (ref 2.5–4.6)

## 2022-03-14 LAB — CMP (CANCER CENTER ONLY)
ALT: 11 U/L (ref 0–44)
AST: 20 U/L (ref 15–41)
Albumin: 3.7 g/dL (ref 3.5–5.0)
Alkaline Phosphatase: 68 U/L (ref 38–126)
Anion gap: 5 (ref 5–15)
BUN: 10 mg/dL (ref 6–20)
CO2: 26 mmol/L (ref 22–32)
Calcium: 8.8 mg/dL — ABNORMAL LOW (ref 8.9–10.3)
Chloride: 108 mmol/L (ref 98–111)
Creatinine: 0.66 mg/dL (ref 0.44–1.00)
GFR, Estimated: >60 mL/min (ref >60)
Glucose, Bld: 101 mg/dL — ABNORMAL HIGH (ref 70–99)
Potassium: 3.9 mmol/L (ref 3.5–5.1)
Sodium: 139 mmol/L (ref 135–145)
Total Bilirubin: 0.3 mg/dL (ref 0.3–1.2)
Total Protein: 6.6 g/dL (ref 6.5–8.1)

## 2022-03-14 LAB — CBC WITH DIFFERENTIAL (CANCER CENTER ONLY)
Abs Immature Granulocytes: 0.01 10*3/uL (ref 0.00–0.07)
Basophils Absolute: 0 10*3/uL (ref 0.0–0.1)
Basophils Relative: 0 %
Eosinophils Absolute: 0.1 10*3/uL (ref 0.0–0.5)
Eosinophils Relative: 2 %
HCT: 34.5 % — ABNORMAL LOW (ref 36.0–46.0)
Hemoglobin: 11.6 g/dL — ABNORMAL LOW (ref 12.0–15.0)
Immature Granulocytes: 0 %
Lymphocytes Relative: 29 %
Lymphs Abs: 0.9 10*3/uL (ref 0.7–4.0)
MCH: 31.3 pg (ref 26.0–34.0)
MCHC: 33.6 g/dL (ref 30.0–36.0)
MCV: 93 fL (ref 80.0–100.0)
Monocytes Absolute: 0.4 10*3/uL (ref 0.1–1.0)
Monocytes Relative: 11 %
Neutro Abs: 1.8 10*3/uL (ref 1.7–7.7)
Neutrophils Relative %: 58 %
Platelet Count: 228 10*3/uL (ref 150–400)
RBC: 3.71 MIL/uL — ABNORMAL LOW (ref 3.87–5.11)
RDW: 14.6 % (ref 11.5–15.5)
WBC Count: 3.1 10*3/uL — ABNORMAL LOW (ref 4.0–10.5)
nRBC: 0 % (ref 0.0–0.2)

## 2022-03-14 LAB — MAGNESIUM: Magnesium: 2.1 mg/dL (ref 1.7–2.4)

## 2022-03-14 MED ORDER — DIPHENHYDRAMINE HCL 50 MG/ML IJ SOLN
50.0000 mg | Freq: Once | INTRAMUSCULAR | Status: AC
  Administered 2022-03-14: 50 mg via INTRAVENOUS
  Filled 2022-03-14: qty 1

## 2022-03-14 MED ORDER — HEPARIN SOD (PORK) LOCK FLUSH 100 UNIT/ML IV SOLN
500.0000 [IU] | Freq: Once | INTRAVENOUS | Status: AC | PRN
  Administered 2022-03-14: 500 [IU]

## 2022-03-14 MED ORDER — SODIUM CHLORIDE 0.9 % IV SOLN
150.0000 mg | Freq: Once | INTRAVENOUS | Status: AC
  Administered 2022-03-14: 150 mg via INTRAVENOUS
  Filled 2022-03-14: qty 150
  Filled 2022-03-14: qty 5

## 2022-03-14 MED ORDER — FAMOTIDINE IN NACL 20-0.9 MG/50ML-% IV SOLN
20.0000 mg | Freq: Once | INTRAVENOUS | Status: AC
  Administered 2022-03-14: 20 mg via INTRAVENOUS
  Filled 2022-03-14: qty 50

## 2022-03-14 MED ORDER — SODIUM CHLORIDE 0.9 % IV SOLN: INTRAVENOUS | Status: DC

## 2022-03-14 MED ORDER — SODIUM CHLORIDE 0.9 % IV SOLN
10.2000 mg/kg | Freq: Once | INTRAVENOUS | Status: AC
  Administered 2022-03-14: 720 mg via INTRAVENOUS
  Filled 2022-03-14: qty 72

## 2022-03-14 MED ORDER — SODIUM CHLORIDE 0.9% FLUSH
10.0000 mL | INTRAVENOUS | Status: DC | PRN
  Administered 2022-03-14: 10 mL

## 2022-03-14 MED ORDER — SODIUM CHLORIDE 0.9 % IV SOLN
10.0000 mg | Freq: Once | INTRAVENOUS | Status: AC
  Administered 2022-03-14: 10 mg via INTRAVENOUS
  Filled 2022-03-14: qty 10
  Filled 2022-03-14: qty 1

## 2022-03-14 MED ORDER — SODIUM CHLORIDE 0.9 % IV SOLN: INTRAVENOUS | Status: AC

## 2022-03-14 MED ORDER — ACETAMINOPHEN 325 MG PO TABS
650.0000 mg | ORAL_TABLET | Freq: Once | ORAL | Status: AC
  Administered 2022-03-14: 650 mg via ORAL
  Filled 2022-03-14: qty 2

## 2022-03-14 MED ORDER — SODIUM CHLORIDE 0.9% FLUSH
10.0000 mL | Freq: Once | INTRAVENOUS | Status: AC
  Administered 2022-03-14: 10 mL

## 2022-03-14 MED ORDER — SODIUM CHLORIDE 0.9 % IV SOLN: Freq: Once | INTRAVENOUS | Status: AC

## 2022-03-14 MED ORDER — PALONOSETRON HCL INJECTION 0.25 MG/5ML
0.2500 mg | Freq: Once | INTRAVENOUS | Status: AC
  Administered 2022-03-14: 0.25 mg via INTRAVENOUS
  Filled 2022-03-14: qty 5

## 2022-03-14 MED ORDER — ATROPINE SULFATE 1 MG/ML IV SOLN
0.5000 mg | Freq: Once | INTRAVENOUS | Status: DC | PRN
  Filled 2022-03-14: qty 1
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
## 2022-03-14 NOTE — Assessment & Plan Note (Signed)
Ellen [name] is a 27 year old woman with stage IV breast cancer here today for follow-up and evaluation prior to receiving her second cycle of [place].  Her most recent cycle was held due to neutropenia.  Her neutrophils have recovered today.  She will proceed with treatment.  She is slightly tachycardic and we will obtain an EKG and give her some IV fluids as she feels like she has not been drinking as much as she should have been lately.  She has her antinausea medicine and will start taking it on Sunday when she started feeling nauseated after her last treatment and she will also take Imodium alternating with Lomotil if she needs it for diarrhea.  Ally's breast remains largely unchanged at this point please see the pictures above.  She will return in 1 week for cycle 2-day 8 of therapy.

## 2022-03-14 NOTE — Progress Notes (Signed)
Ok to treat with elevated HR per Wilber Bihari, NP.

## 2022-03-18 ENCOUNTER — Telehealth: Payer: Self-pay

## 2022-03-18 ENCOUNTER — Encounter: Payer: Self-pay | Admitting: Adult Health

## 2022-03-18 NOTE — Telephone Encounter (Signed)
Called Pt regarding mychart message. Pt asked if there is anything she can do to treat fatigue. Pt states she "does not feel as sick as last time" but is having increased fatigue and feels guilty about "not doing anything". Discussed with Pt that fatigue is unfortunate side effect of chemo and to not feel guilty about needing increased rest. Discussed important of hydration during this time. Reviewed most recent labs with Pt that were stable. Pt verbalized understanding and had no further concerns at this time.

## 2022-03-19 ENCOUNTER — Encounter: Payer: Self-pay | Admitting: Hematology and Oncology

## 2022-03-20 MED FILL — Fosaprepitant Dimeglumine For IV Infusion 150 MG (Base Eq): INTRAVENOUS | Qty: 5 | Status: AC

## 2022-03-20 MED FILL — Dexamethasone Sodium Phosphate Inj 100 MG/10ML: INTRAMUSCULAR | Qty: 1 | Status: AC

## 2022-03-21 ENCOUNTER — Inpatient Hospital Stay: Payer: Medicaid Other | Attending: Adult Health

## 2022-03-21 ENCOUNTER — Encounter: Payer: Self-pay | Admitting: Adult Health

## 2022-03-21 ENCOUNTER — Other Ambulatory Visit: Payer: Self-pay | Admitting: Adult Health

## 2022-03-21 ENCOUNTER — Inpatient Hospital Stay (HOSPITAL_BASED_OUTPATIENT_CLINIC_OR_DEPARTMENT_OTHER): Payer: Medicaid Other | Admitting: Adult Health

## 2022-03-21 ENCOUNTER — Inpatient Hospital Stay: Payer: Medicaid Other

## 2022-03-21 ENCOUNTER — Other Ambulatory Visit: Payer: Self-pay

## 2022-03-21 ENCOUNTER — Ambulatory Visit (HOSPITAL_COMMUNITY)
Admission: RE | Admit: 2022-03-21 | Discharge: 2022-03-21 | Disposition: A | Discharge status: Home / Self Care | Payer: Medicaid Other | Source: Ambulatory Visit | Attending: Adult Health | Admitting: Adult Health

## 2022-03-21 ENCOUNTER — Encounter: Payer: Self-pay | Admitting: General Practice

## 2022-03-21 ENCOUNTER — Other Ambulatory Visit (HOSPITAL_COMMUNITY): Payer: Self-pay

## 2022-03-21 VITALS — BP 117/63 | HR 99 | Temp 99.0°F | Resp 16 | Ht 65.0 in | Wt 144.7 lb

## 2022-03-21 VITALS — BP 111/85 | HR 100 | Resp 17

## 2022-03-21 DIAGNOSIS — Z9221 Personal history of antineoplastic chemotherapy: Secondary | ICD-10-CM | POA: Diagnosis not present

## 2022-03-21 DIAGNOSIS — R112 Nausea with vomiting, unspecified: Secondary | ICD-10-CM | POA: Diagnosis not present

## 2022-03-21 DIAGNOSIS — R051 Acute cough: Secondary | ICD-10-CM | POA: Insufficient documentation

## 2022-03-21 DIAGNOSIS — C50919 Malignant neoplasm of unspecified site of unspecified female breast: Secondary | ICD-10-CM

## 2022-03-21 DIAGNOSIS — Z79899 Other long term (current) drug therapy: Secondary | ICD-10-CM | POA: Diagnosis not present

## 2022-03-21 DIAGNOSIS — C50312 Malignant neoplasm of lower-inner quadrant of left female breast: Secondary | ICD-10-CM | POA: Insufficient documentation

## 2022-03-21 DIAGNOSIS — R0789 Other chest pain: Secondary | ICD-10-CM | POA: Insufficient documentation

## 2022-03-21 DIAGNOSIS — R197 Diarrhea, unspecified: Secondary | ICD-10-CM | POA: Diagnosis not present

## 2022-03-21 DIAGNOSIS — R062 Wheezing: Secondary | ICD-10-CM | POA: Insufficient documentation

## 2022-03-21 DIAGNOSIS — C7951 Secondary malignant neoplasm of bone: Secondary | ICD-10-CM | POA: Insufficient documentation

## 2022-03-21 DIAGNOSIS — C50912 Malignant neoplasm of unspecified site of left female breast: Secondary | ICD-10-CM

## 2022-03-21 DIAGNOSIS — R5383 Other fatigue: Secondary | ICD-10-CM | POA: Diagnosis not present

## 2022-03-21 DIAGNOSIS — Z5111 Encounter for antineoplastic chemotherapy: Secondary | ICD-10-CM | POA: Diagnosis present

## 2022-03-21 DIAGNOSIS — Z923 Personal history of irradiation: Secondary | ICD-10-CM | POA: Diagnosis not present

## 2022-03-21 DIAGNOSIS — Z171 Estrogen receptor negative status [ER-]: Secondary | ICD-10-CM | POA: Insufficient documentation

## 2022-03-21 DIAGNOSIS — R0602 Shortness of breath: Secondary | ICD-10-CM | POA: Insufficient documentation

## 2022-03-21 DIAGNOSIS — Z7951 Long term (current) use of inhaled steroids: Secondary | ICD-10-CM | POA: Diagnosis not present

## 2022-03-21 DIAGNOSIS — D709 Neutropenia, unspecified: Secondary | ICD-10-CM | POA: Diagnosis not present

## 2022-03-21 DIAGNOSIS — Z170 Estrogen receptor positive status [ER+]: Secondary | ICD-10-CM | POA: Insufficient documentation

## 2022-03-21 DIAGNOSIS — C787 Secondary malignant neoplasm of liver and intrahepatic bile duct: Secondary | ICD-10-CM | POA: Insufficient documentation

## 2022-03-21 DIAGNOSIS — Z95828 Presence of other vascular implants and grafts: Secondary | ICD-10-CM

## 2022-03-21 LAB — CBC WITH DIFFERENTIAL (CANCER CENTER ONLY)
Abs Immature Granulocytes: 0.01 10*3/uL (ref 0.00–0.07)
Basophils Absolute: 0 10*3/uL (ref 0.0–0.1)
Basophils Relative: 1 %
Eosinophils Absolute: 0 10*3/uL (ref 0.0–0.5)
Eosinophils Relative: 2 %
HCT: 33 % — ABNORMAL LOW (ref 36.0–46.0)
Hemoglobin: 11.4 g/dL — ABNORMAL LOW (ref 12.0–15.0)
Immature Granulocytes: 1 %
Lymphocytes Relative: 41 %
Lymphs Abs: 0.9 10*3/uL (ref 0.7–4.0)
MCH: 31.2 pg (ref 26.0–34.0)
MCHC: 34.5 g/dL (ref 30.0–36.0)
MCV: 90.4 fL (ref 80.0–100.0)
Monocytes Absolute: 0.3 10*3/uL (ref 0.1–1.0)
Monocytes Relative: 13 %
Neutro Abs: 0.9 10*3/uL — ABNORMAL LOW (ref 1.7–7.7)
Neutrophils Relative %: 42 %
Platelet Count: 253 10*3/uL (ref 150–400)
RBC: 3.65 MIL/uL — ABNORMAL LOW (ref 3.87–5.11)
RDW: 13.9 % (ref 11.5–15.5)
WBC Count: 2.1 10*3/uL — ABNORMAL LOW (ref 4.0–10.5)
nRBC: 0 % (ref 0.0–0.2)

## 2022-03-21 LAB — CMP (CANCER CENTER ONLY)
ALT: 13 U/L (ref 0–44)
AST: 25 U/L (ref 15–41)
Albumin: 3.7 g/dL (ref 3.5–5.0)
Alkaline Phosphatase: 66 U/L (ref 38–126)
Anion gap: 6 (ref 5–15)
BUN: 8 mg/dL (ref 6–20)
CO2: 27 mmol/L (ref 22–32)
Calcium: 8.6 mg/dL — ABNORMAL LOW (ref 8.9–10.3)
Chloride: 106 mmol/L (ref 98–111)
Creatinine: 0.65 mg/dL (ref 0.44–1.00)
GFR, Estimated: >60 mL/min (ref >60)
Glucose, Bld: 112 mg/dL — ABNORMAL HIGH (ref 70–99)
Potassium: 3.8 mmol/L (ref 3.5–5.1)
Sodium: 139 mmol/L (ref 135–145)
Total Bilirubin: 0.2 mg/dL — ABNORMAL LOW (ref 0.3–1.2)
Total Protein: 6.1 g/dL — ABNORMAL LOW (ref 6.5–8.1)

## 2022-03-21 MED ORDER — ONDANSETRON 8 MG PO TBDP
8.0000 mg | ORAL_TABLET | Freq: Three times a day (TID) | ORAL | 0 refills | Status: DC | PRN
  Filled 2022-03-21: qty 20, 7d supply, fill #0

## 2022-03-21 MED ORDER — ALBUTEROL SULFATE HFA 108 (90 BASE) MCG/ACT IN AERS: INHALATION_SPRAY | RESPIRATORY_TRACT | 2 refills | Status: DC

## 2022-03-21 MED ORDER — ATROPINE SULFATE 1 MG/ML IV SOLN
0.5000 mg | Freq: Once | INTRAVENOUS | Status: AC | PRN
  Administered 2022-03-21: 0.5 mg via INTRAVENOUS
  Filled 2022-03-21: qty 1

## 2022-03-21 MED ORDER — SODIUM CHLORIDE 0.9% FLUSH
10.0000 mL | Freq: Once | INTRAVENOUS | Status: AC
  Administered 2022-03-21: 10 mL

## 2022-03-21 MED ORDER — FAMOTIDINE IN NACL 20-0.9 MG/50ML-% IV SOLN
20.0000 mg | Freq: Once | INTRAVENOUS | Status: AC
  Administered 2022-03-21: 20 mg via INTRAVENOUS
  Filled 2022-03-21: qty 50

## 2022-03-21 MED ORDER — ACETAMINOPHEN 325 MG PO TABS
650.0000 mg | ORAL_TABLET | Freq: Once | ORAL | Status: AC
  Administered 2022-03-21: 650 mg via ORAL
  Filled 2022-03-21: qty 2

## 2022-03-21 MED ORDER — DIPHENHYDRAMINE HCL 50 MG/ML IJ SOLN
50.0000 mg | Freq: Once | INTRAMUSCULAR | Status: AC
  Administered 2022-03-21: 50 mg via INTRAVENOUS
  Filled 2022-03-21: qty 1

## 2022-03-21 MED ORDER — SODIUM CHLORIDE 0.9 % IV SOLN: INTRAVENOUS | Status: DC

## 2022-03-21 MED ORDER — FLUTICASONE PROPIONATE HFA 44 MCG/ACT IN AERO: 2.0000 | INHALATION_SPRAY | Freq: Two times a day (BID) | RESPIRATORY_TRACT | 12 refills | Status: DC

## 2022-03-21 MED ORDER — SODIUM CHLORIDE 0.9 % IV SOLN
10.0000 mg | Freq: Once | INTRAVENOUS | Status: AC
  Administered 2022-03-21: 10 mg via INTRAVENOUS
  Filled 2022-03-21: qty 10

## 2022-03-21 MED ORDER — SODIUM CHLORIDE 0.9 % IV SOLN
150.0000 mg | Freq: Once | INTRAVENOUS | Status: AC
  Administered 2022-03-21: 150 mg via INTRAVENOUS
  Filled 2022-03-21: qty 150

## 2022-03-21 MED ORDER — HEPARIN SOD (PORK) LOCK FLUSH 100 UNIT/ML IV SOLN
500.0000 [IU] | Freq: Once | INTRAVENOUS | Status: AC | PRN
  Administered 2022-03-21: 500 [IU]

## 2022-03-21 MED ORDER — PALONOSETRON HCL INJECTION 0.25 MG/5ML
0.2500 mg | Freq: Once | INTRAVENOUS | Status: AC
  Administered 2022-03-21: 0.25 mg via INTRAVENOUS
  Filled 2022-03-21: qty 5

## 2022-03-21 MED ORDER — SODIUM CHLORIDE 0.9% FLUSH
10.0000 mL | INTRAVENOUS | Status: DC | PRN
  Administered 2022-03-21: 10 mL

## 2022-03-21 MED ORDER — METHYLPREDNISOLONE 4 MG PO TBPK: ORAL_TABLET | ORAL | 0 refills | Status: DC

## 2022-03-21 MED ORDER — SODIUM CHLORIDE 0.9 % IV SOLN
10.2000 mg/kg | Freq: Once | INTRAVENOUS | Status: AC
  Administered 2022-03-21: 720 mg via INTRAVENOUS
  Filled 2022-03-21: qty 72

## 2022-03-21 NOTE — Assessment & Plan Note (Signed)
Ellen [name] is a 27 year old woman with metastatic breast cancer currently on treatment with [place].  Today is cycle 2-day 8 of therapy.  Ellen [name] and I reviewed her chest tightness in detail.  I suggested that she get a chest x-ray to evaluate the lungs and rule out infection.  Her chest x-ray showed increased interstitial markings throughout both lungs that had possibly progressed since her CT scan that occurred on January 27, 2022.  There is concern for this representing lymphangitic spread of her tumor or possibly an inflammatory etiology.  I reviewed these results in detail with Dr. Lindi Adie.  After discussion we opted to refill her albuterol inhaler, start Flovent inhaler, and prescribed a Medrol Dosepak.  This was reviewed with Ellen [name] and she knows to call if she develops any increasing shortness of breath or worsening lung concerns despite our treatment as she will need a CT chest repeated at that point.  Ellen [name]'s labs demonstrate an ANC of 0.9.  I reviewed this lab with Dr. Lindi Adie.  Since she is cycle 2-day 8 of therapy and receives Udenyca injection tomorrow she was cleared to proceed with her treatment.  Ellen [name] will return in 2 weeks for labs, follow-up with Dr. Lindi Adie, and her next cycle of Trodelvy.  Should her symptoms stabilize with the chest tightness we will repeat CT chest abdomen pelvis after cycle 3 of Trodelvy.

## 2022-03-21 NOTE — Progress Notes (Signed)
[facility] Spiritual Care Note  Followed up with Ellen [name] in infusion. 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 She is working hard to maintain peace in the present moment and in her relationships with others, identifying this as a guiding moral principle of her life.  Provided reflective listening, spiritual support, and affirmation of strengths, particularly self-awareness and reflection.  In-person visits are ideal for [place], so we plan to continue following up in infusion.    [place], [place], [facility] Pager [phone] Voicemail [phone]

## 2022-03-21 NOTE — Patient Instructions (Signed)
[facility]  Discharge Instructions: Thank you for choosing [facility] to provide your oncology and hematology care.   If you have a lab appointment with the Cancer Center, please go directly to the Cancer Center and check in at the registration area.   Wear comfortable clothing and clothing appropriate for easy access to any Portacath or PICC line.   We strive to give you quality time with your provider. You may need to reschedule your appointment if you arrive late (15 or more minutes).  Arriving late affects you and other patients whose appointments are after yours.  Also, if you miss three or more appointments without notifying the office, you may be dismissed from the clinic at the provider's discretion.      For prescription refill requests, have your pharmacy contact our office and allow 72 hours for refills to be completed.    Today you received the following chemotherapy and/or immunotherapy agents trodelvy      To help prevent nausea and vomiting after your treatment, we encourage you to take your nausea medication as directed.  BELOW ARE SYMPTOMS THAT SHOULD BE REPORTED IMMEDIATELY: *FEVER GREATER THAN 100.4 F (38 C) OR HIGHER *CHILLS OR SWEATING *NAUSEA AND VOMITING THAT IS NOT CONTROLLED WITH YOUR NAUSEA MEDICATION *UNUSUAL SHORTNESS OF BREATH *UNUSUAL BRUISING OR BLEEDING *URINARY PROBLEMS (pain or burning when urinating, or frequent urination) *BOWEL PROBLEMS (unusual diarrhea, constipation, pain near the anus) TENDERNESS IN MOUTH AND THROAT WITH OR WITHOUT PRESENCE OF ULCERS (sore throat, sores in mouth, or a toothache) UNUSUAL RASH, SWELLING OR PAIN  UNUSUAL VAGINAL DISCHARGE OR ITCHING   Items with * indicate a potential emergency and should be followed up as soon as possible or go to the Emergency Department if any problems should occur.  Please show the CHEMOTHERAPY ALERT CARD or IMMUNOTHERAPY ALERT CARD at  check-in to the Emergency Department and triage nurse.  Should you have questions after your visit or need to cancel or reschedule your appointment, please contact [facility]  Dept: [phone]  and follow the prompts.  Office hours are 8:00 a.m. to 4:30 p.m. Monday - Friday. Please note that voicemails left after 4:00 p.m. may not be returned until the following business day.  We are closed weekends and major holidays. You have access to a nurse at all times for urgent questions. Please call the main number to the clinic Dept: [phone] and follow the prompts.   For any non-urgent questions, you may also contact your provider using MyChart. We now offer e-Visits for anyone 18 and older to request care online for non-urgent symptoms. For details visit [email or web address].   Also download the MyChart app! Go to the app store, search "MyChart", open the app, select [place], and log in with your MyChart username and password.   
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
## 2022-03-21 NOTE — Progress Notes (Signed)
Pt declined to stay for post 30 min obs, discharged with VSS, ambulatory to lobby 

## 2022-03-21 NOTE — Progress Notes (Signed)
Per Suzan Garibaldi., PA ok to treat with ANC 0.9 K/uL and without mag or phos collected today.

## 2022-03-22 ENCOUNTER — Inpatient Hospital Stay: Payer: Medicaid Other

## 2022-03-22 VITALS — BP 137/80 | HR 106 | Temp 98.2°F | Resp 18

## 2022-03-22 DIAGNOSIS — Z5111 Encounter for antineoplastic chemotherapy: Secondary | ICD-10-CM | POA: Diagnosis not present

## 2022-03-22 DIAGNOSIS — Z95828 Presence of other vascular implants and grafts: Secondary | ICD-10-CM

## 2022-03-22 DIAGNOSIS — C50919 Malignant neoplasm of unspecified site of unspecified female breast: Secondary | ICD-10-CM

## 2022-03-22 DIAGNOSIS — C50912 Malignant neoplasm of unspecified site of left female breast: Secondary | ICD-10-CM

## 2022-03-22 MED ORDER — SODIUM CHLORIDE 0.9 % IV SOLN: INTRAVENOUS | Status: DC

## 2022-03-22 MED ORDER — PEGFILGRASTIM-CBQV 6 MG/0.6ML ~~LOC~~ SOSY
6.0000 mg | PREFILLED_SYRINGE | Freq: Once | SUBCUTANEOUS | Status: AC
  Administered 2022-03-22: 6 mg via SUBCUTANEOUS
  Filled 2022-03-22: qty 0.6

## 2022-03-22 MED ORDER — SODIUM CHLORIDE 0.9% FLUSH
10.0000 mL | Freq: Once | INTRAVENOUS | Status: AC
  Administered 2022-03-22: 10 mL

## 2022-03-22 MED ORDER — HEPARIN SOD (PORK) LOCK FLUSH 100 UNIT/ML IV SOLN
500.0000 [IU] | Freq: Once | INTRAVENOUS | Status: AC
  Administered 2022-03-22: 500 [IU]

## 2022-03-22 NOTE — Progress Notes (Signed)
Per Wilber Bihari, ok for pt to get a tomorrow. Note sent vis mychart.

## 2022-04-03 ENCOUNTER — Encounter: Payer: Self-pay | Admitting: Hematology and Oncology

## 2022-04-03 MED FILL — Fosaprepitant Dimeglumine For IV Infusion 150 MG (Base Eq): INTRAVENOUS | Qty: 5 | Status: AC

## 2022-04-03 MED FILL — Dexamethasone Sodium Phosphate Inj 100 MG/10ML: INTRAMUSCULAR | Qty: 1 | Status: AC

## 2022-04-03 NOTE — Progress Notes (Signed)
Patient Care Team: Pcp, No as PCP - General [name], MD as Consulting Physician (Hematology and Oncology) [name], [name], NP as Nurse Practitioner (Hematology and Oncology) [name], MD as Consulting Physician (General Surgery) [name], MD as Consulting Physician (Radiation Oncology) [name], [name], NP as Nurse Practitioner (Nurse Practitioner) [name], [name], NP as Nurse Practitioner (Nurse Practitioner)  DIAGNOSIS:  Encounter Diagnoses  Name Primary?   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  Prominent left axillary lymph node cluster of adjacent lymph nodes, irregular nodule right middle lobe 1.1 cm, aggressive lesion right second rib cortical destruction, multiple lucent lesions throughout the thoracic spine including T1 vertebral body and T2, irregular multilobar lesion central left hepatic lobe 4.3 x 4.7 cm.  Several satellite lesions in the left lateral hepatic lobe.  Multiple sclerotic lesions in the bones iliac wings, acetabular, medial iliac bones, sacrum multiple lesions lumbar spine L3-L4 and L5   08/17/2020 Initial Diagnosis   Biopsy revealed IDC with DCIS grade 2, ER 30%, PR 20%, HER2 equivocal by IHC, FISH positive, Ki-67 25%, lymph node positive    Genetic Testing   Negative genetic testing:  No pathogenic variants detected on the [employer] CancerNext-Expanded + RNAinsight panel. The report date is 09/06/2020.   The CancerNext-Expanded + RNAinsight gene panel offered by [employer] and includes sequencing and rearrangement analysis for the following 77 genes: AIP, ALK, APC, ATM, AXIN2, BAP1, BARD1, BLM, BMPR1A, BRCA1, BRCA2, BRIP1, CDC73, CDH1, CDK4, CDKN1B,  CDKN2A, CHEK2, CTNNA1, DICER1, FANCC, FH, FLCN, GALNT12, KIF1B, LZTR1, MAX, MEN1, MET, MLH1, MSH2, MSH3, MSH6, MUTYH, NBN, NF1, NF2, NTHL1, PALB2, PHOX2B, PMS2, POT1, PRKAR1A, PTCH1, PTEN, RAD51C, RAD51D, RB1, RECQL, RET, SDHA, SDHAF2, SDHB, SDHC, SDHD, SMAD4, SMARCA4, SMARCB1, SMARCE1, STK11, SUFU, TMEM127, TP53, TSC1, TSC2, VHL and XRCC2 (sequencing and deletion/duplication); EGFR, EGLN1, HOXB13, KIT, MITF, PDGFRA, POLD1 and POLE (sequencing only); EPCAM and GREM1 (deletion/duplication only). RNA data is routinely analyzed for use in variant interpretation for all genes.   09/17/2021 - 10/07/2021 Chemotherapy   Patient is on Treatment Plan : BREAST METASTATIC fam-trastuzumab deruxtecan-nxki (Enhertu) q21d     09/17/2021 - 10/29/2021 Chemotherapy   Patient is on Treatment Plan : BREAST METASTATIC Fam-Trastuzumab Deruxtecan-nxki (Enhertu) (5.4) q21d     11/19/2021 - 01/15/2022 Chemotherapy   Patient is on Treatment Plan : BREAST Carboplatin + Gemcitabine D1,8 q21d     02/14/2022 -  Chemotherapy   Patient is on Treatment Plan : BREAST METASTATIC Sacituzumab govitecan-hziy [name]) D1,8 q21d     Carcinoma of breast metastatic to bone ([place])  08/17/2020 Initial Diagnosis   Carcinoma of breast metastatic to bone ([place]); Goserelin/Zoladex started and to be given every 4 weeks    Genetic Testing   Negative genetic testing:  No pathogenic variants detected on the [employer] CancerNext-Expanded + RNAinsight panel. The report date is 09/06/2020.   The CancerNext-Expanded + RNAinsight gene panel offered by [employer] and includes sequencing and rearrangement analysis for the following 77 genes: AIP, ALK, APC, ATM, AXIN2, BAP1, BARD1, BLM, BMPR1A, BRCA1, BRCA2, BRIP1, CDC73, CDH1, CDK4, CDKN1B, CDKN2A, CHEK2, CTNNA1, DICER1, FANCC, FH, FLCN, GALNT12, KIF1B, LZTR1, MAX, MEN1, MET, MLH1, MSH2, MSH3, MSH6, MUTYH, NBN, NF1, NF2, NTHL1, PALB2, PHOX2B, PMS2, POT1, PRKAR1A,  PTCH1, PTEN, RAD51C, RAD51D, RB1, RECQL, RET,  SDHA, SDHAF2, SDHB, SDHC, SDHD, SMAD4, SMARCA4, SMARCB1, SMARCE1, STK11, SUFU, TMEM127, TP53, TSC1, TSC2, VHL and XRCC2 (sequencing and deletion/duplication); EGFR, EGLN1, HOXB13, KIT, MITF, PDGFRA, POLD1 and POLE (sequencing only); EPCAM and GREM1 (deletion/duplication only). RNA data is routinely analyzed for use in variant interpretation for all genes.   08/18/2020 - 08/30/2020 Radiation Therapy   Palliative radiation to the lumbar and pelvic bone; 30 Gy in 10 fractions   08/31/2020 - 12/18/2020 Adjuvant Chemotherapy   Started with Herceptin/Perjeta on 08/31/2020 and Taxotere on 09/04/2020; she was hospitalized with dehydration and refractory diarrhea for 11 days, she resumed treatment with Taxotere (dose reduced by 54m/m2) and Herceptin only (perjeta discontinued) on 09/26/2020, reintroduced 10/17/2020   09/03/2020 Cancer Staging   Staging form: Breast, AJCC 8th Edition - Clinical stage from 09/03/2020: Stage IV (cT4b, cN1, cM1, G2, ER+, PR+, HER2+) - Signed by CGardenia Phlegm NP on 10/03/2020 Stage prefix: Initial diagnosis Method of lymph node assessment: Clinical Histologic grading system: 3 grade system   01/08/2021 - 08/27/2021 Adjuvant Chemotherapy   Herceptin/Perjeta every 3 weeks, Zometa every 12 weeks added 01/29/2021   07/07/2021 PET scan   1. Interval decrease in size of the dominant left breast mass which now exhibits mild low level FDG uptake with SUV max of 2.7. Small nodule within the left breast exhibits moderate tracer uptake within SUV max of 5.6 and is suspicious for residual/recurrent local tumor. 2. Mild tracer uptake associated with prominent left axillary lymph node with SUV max of 3.35. Residual tracer avid nodal metastasis not excluded. 3. The previously noted liver metastasis are no longer visible on the CT images from today's study. There is no abnormal increased uptake within the left hepatic lobe above background liver activity to suggest metabolically  active tumor metastasis. 4. Interval increase in sclerosis associated with previously noted lytic bone metastasis without significant increased uptake above background bone marrow activity compatible with healing multifocal bone metastasis. 5. New pathologic fracture involves the L3 vertebral body where there was extensive lytic changes noted on the previous CT. The collapsed vertebral body is now sclerotic with mild low level FDG uptake. 6. Similar appearance of soft tissue infiltration within the anterior mediastinal fat which exhibits mild FDG uptake on today's study. 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   07/16/2021 Surgery   S/p BSO, benign pathology    08/26/2021 Mammogram   Mammo diagnostic breast tomosynthesis left: There is an additional  irregular mass in the lower inner position at posterior depth. This was  further evaluated with ultrasound.   Mammo UKoreabreast limited left: There is a 0.7 cm x 0.5 cm x 0.7 cm  irregularly shaped, hypoechoic mass with indistinct margins seen in the  left breast at 8 o'clock, 8 cm from the nipple. This mass is 1.6 cm medial  to the palpable mass as detailed above.    08/26/2021 Pathology Results   New left breast mass 8:00 biopsy: Invasive carcinoma with mixed ductal and lobular and focal micropapillary features.  Grade 3, invasive carcinoma measures 9 mm with an associated lymphoid reaction.  ER negative PR negative HER2 1+.  Left breast mass 2:00 biopsy: Invasive carcinoma with mixed ductal and lobular features Nottingham grade 2 measuring 1.4 cm.  ER 9% (low positive), PR negative, HER2 1+.   09/16/2021 PET scan   1. Interval progression of hypermetabolic metastatic disease. 2. Hypermetabolic bilateral axillary lymphadenopathy, subcarinal and bilateral hilar lymphadenopathy, new/progressive. 3. New hypermetabolic  nodularity in the lower left breast is compatible with progressive malignancy. 4. New hypermetabolic mixed  lytic and sclerotic left pelvic bone metastases as detailed. 5. Persistent patchy hypermetabolism in the anterior mediastinum associated with mixed soft tissue and fat density, favor thymic hyperplasia.   09/17/2021 - 10/07/2021 Chemotherapy   Patient is on Treatment Plan : BREAST METASTATIC fam-trastuzumab deruxtecan-nxki (Enhertu) q21d     09/17/2021 - 10/29/2021 Chemotherapy   Patient is on Treatment Plan : BREAST METASTATIC Fam-Trastuzumab Deruxtecan-nxki (Enhertu) (5.4) q21d     11/11/2021 PET scan    1. Hypermetabolic left breast soft tissue and skin thickening is increased  from prior examination and suspicious for worsening primary malignancy.   2. There is evidence of interval worsening hypermetabolic adenopathy.  Primary differential consideration is worsening nodal metastasis, however  given the distribution the possibility of sarcoid-like reaction in the  setting of therapy is raised. There is worsening hypermetabolic left  axillary adenopathy with new areas of hypermetabolic adenopathy seen in the  left subpectoral, internal mammary, cardiophrenic, right axillary,  mediastinal, and bilateral hilar nodal stations. A few nonspecific FDG avid  nonenlarged left pelvic/inguinal nodes are also seen. Tissue sampling  should be considered for definitive assessment, otherwise close follow-up  is recommended.   3. Interval increased hypermetabolic activity associated with osseous  metastasis in the left hemipelvis. Other nonavid sclerotic bone lesions may  reflect sites of treated disease.   Electronically Reviewed by:  Mahala Menghini, MD, [facility]  Electronically Reviewed on:  11/11/2021 6:13 PM    11/19/2021 - 01/15/2022 Chemotherapy   Patient is on Treatment Plan : BREAST Carboplatin + Gemcitabine D1,8 q21d     02/14/2022 -  Chemotherapy   Patient is on Treatment Plan : BREAST METASTATIC Sacituzumab govitecan-hziy [name]) D1,8 q21d       CHIEF COMPLIANT: Cycle 3-day 1  [name]   INTERVAL HISTORY: Ellen [name] is a 27 y.o female history of metastatic breast cancer was currently on chemotherapy with [name]. She presents to the clinic for a follow-up. She reports the treatment is getting easier. She denies vomiting, but some nausea. She did have some mid diarrhea. She says the fluids helped with her hydration. She complains of the redness and thickness around the left breast. She says the fatigue wasn't as bad this time.   ALLERGIES:  is allergic to other.  MEDICATIONS:  Current Outpatient Medications  Medication Sig Dispense Refill   acyclovir (ZOVIRAX) 800 MG tablet Take 1 tablet (800 mg total) by mouth 2 (two) times daily as needed (flare up). 60 tablet 5   acyclovir ointment (ZOVIRAX) 5 % Apply 1 application  topically 4 (four) times daily as needed (outbreak).     albuterol (VENTOLIN HFA) 108 (90 Base) MCG/ACT inhaler 2 puffs inhaled every 4-6 hours PRN 8 g 2   Calcium Carb-Cholecalciferol 500-10 MG-MCG TABS Take by mouth.     diphenoxylate-atropine (LOMOTIL) 2.5-0.025 MG tablet Take 1 tablet by mouth 4 (four) times daily as needed for diarrhea or loose stools. 30 tablet 0   fluticasone (FLOVENT HFA) 44 MCG/ACT inhaler Inhale 2 puffs into the lungs in the morning and at bedtime. 1 each 12   gabapentin (NEURONTIN) 300 MG capsule Take 1 capsule (300 mg total) by mouth 3 (three) times daily. 90 capsule 3   lidocaine-prilocaine (EMLA) cream      LORazepam (ATIVAN) 0.5 MG tablet Take 1 tablet (0.5 mg total) by mouth every 8 (eight) hours as needed for anxiety. 45 tablet 0  methylPREDNISolone (MEDROL DOSEPAK) 4 MG TBPK tablet Taper 6,5,4,3,2,1 21 tablet 0   ondansetron (ZOFRAN-ODT) 8 MG disintegrating tablet Dissolve 1 tablet (8 mg total) in mouth every 8 (eight) hours as needed for nausea or vomiting. 20 tablet 0   pantoprazole (PROTONIX) 40 MG tablet Take 1 tablet (40 mg total) by mouth daily. 30 tablet 3   prochlorperazine (COMPAZINE) 10 MG tablet  Take 1 tablet (10 mg total) by mouth every 6 (six) hours as needed for nausea or vomiting. 30 tablet 0   venlafaxine XR (EFFEXOR-XR) 150 MG 24 hr capsule Take 1 capsule (150 mg total) by mouth daily with breakfast. 30 capsule 6   lisdexamfetamine (VYVANSE) 40 MG capsule Take 1 capsule by mouth daily. (Patient not taking: Reported on 02/20/2022)     No current facility-administered medications for this visit.    PHYSICAL EXAMINATION: ECOG PERFORMANCE STATUS: 1 - Symptomatic but completely ambulatory  Vitals:   04/04/22 0935  BP: 117/77  Pulse: 88  Resp: 14  Temp: (!) 97.5 F (36.4 C)  SpO2: 97%   Filed Weights   04/04/22 0935  Weight: 146 lb 11.2 oz (66.5 kg)    BREAST Left breast mass slightly worse clinically. (exam performed in the presence of a chaperone)   LABORATORY DATA:  I have reviewed the data as listed    Latest Ref Rng & Units 03/21/2022    9:49 AM 03/14/2022    9:52 AM 03/07/2022    9:47 AM  CMP  Glucose 70 - 99 mg/dL 112  101  113   BUN 6 - 20 mg/dL 8  10  11   $ Creatinine 0.44 - 1.00 mg/dL 0.65  0.66  0.64   Sodium 135 - 145 mmol/L 139  139  140   Potassium 3.5 - 5.1 mmol/L 3.8  3.9  3.8   Chloride 98 - 111 mmol/L 106  108  108   CO2 22 - 32 mmol/L 27  26  24   $ Calcium 8.9 - 10.3 mg/dL 8.6  8.8  9.1   Total Protein 6.5 - 8.1 g/dL 6.1  6.6  6.4   Total Bilirubin 0.3 - 1.2 mg/dL 0.2  0.3  0.4   Alkaline Phos 38 - 126 U/L 66  68  64   AST 15 - 41 U/L 25  20  19   $ ALT 0 - 44 U/L 13  11  19     $ Lab Results  Component Value Date   WBC 8.1 04/04/2022   HGB 11.6 (L) 04/04/2022   HCT 35.0 (L) 04/04/2022   MCV 93.1 04/04/2022   PLT 229 04/04/2022   NEUTROABS 6.3 04/04/2022    ASSESSMENT & PLAN:  Carcinoma of breast metastatic to bone ([place]) 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,  FISH positive ratio 2.8, Ki-67 25%, lymph node positive   Palliative radiation completed 09/03/2020 Taxotere/Herceptin/Perjeta 08/31/2020-12/18/2020 Herceptin Perjeta Maintenance  beginning 01/08/2021-08/27/2021 Enhertu: 09/17/2021-10/29/2021: Discontinued for progression Carboplatin and gemcitabine 11/19/2021-01/15/2022: Discontinued for progression   Biopsy of the breast at [facility] on 08/26/2021: IDC with lobular 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 Nausea and vomiting: Patient was prescribed Zofran  it was very severe for 3 days after chemo. Diarrhea: Resolved in 1 to 2 days with the Lomotil.   Our plan is to administer Neulasta on day 9 of each cycle She will also need IV fluids on day 8 Scans will be ordered to be done after the cycle  Return to clinic days 1 and 8 every 3 weeks for Trodelvy.    Orders Placed This Encounter  Procedures   CT CHEST ABDOMEN PELVIS W CONTRAST    Standing Status:   Future    Standing Expiration Date:   04/05/2023    Order Specific Question:   If indicated for the ordered procedure, I authorize the administration of contrast media per Radiology protocol    Answer:   Yes    Order Specific Question:   Does the patient have a contrast media/X-ray dye allergy?    Answer:   No    Order Specific Question:   Is patient pregnant?    Answer:   No    Order Specific Question:   Preferred imaging location?    Answer:   [facility]    Order Specific Question:   Is Oral Contrast requested for this exam?    Answer:   Yes, Per Radiology protocol   The patient has a good understanding of the overall plan. she agrees with it. she will call  with any problems that may develop before the next visit here. Total time spent: 30 mins including face to face time and time spent for planning, charting and co-ordination of care   Harriette Ohara, MD 04/04/22    I Gardiner Coins am acting as a Education administrator for [employer]  I have reviewed the above documentation for accuracy and completeness, and I agree with the above.

## 2022-04-04 ENCOUNTER — Inpatient Hospital Stay: Payer: Medicaid Other

## 2022-04-04 ENCOUNTER — Inpatient Hospital Stay: Payer: Medicaid Other | Admitting: Licensed Clinical Social Worker

## 2022-04-04 ENCOUNTER — Inpatient Hospital Stay (HOSPITAL_BASED_OUTPATIENT_CLINIC_OR_DEPARTMENT_OTHER): Payer: Medicaid Other | Admitting: Hematology and Oncology

## 2022-04-04 VITALS — BP 104/70 | HR 107 | Resp 18

## 2022-04-04 VITALS — BP 117/77 | HR 88 | Temp 97.5°F | Resp 14 | Ht 65.0 in | Wt 146.7 lb

## 2022-04-04 DIAGNOSIS — C50919 Malignant neoplasm of unspecified site of unspecified female breast: Secondary | ICD-10-CM

## 2022-04-04 DIAGNOSIS — C50912 Malignant neoplasm of unspecified site of left female breast: Secondary | ICD-10-CM | POA: Diagnosis not present

## 2022-04-04 DIAGNOSIS — C7951 Secondary malignant neoplasm of bone: Secondary | ICD-10-CM

## 2022-04-04 DIAGNOSIS — Z5111 Encounter for antineoplastic chemotherapy: Secondary | ICD-10-CM | POA: Diagnosis not present

## 2022-04-04 DIAGNOSIS — Z95828 Presence of other vascular implants and grafts: Secondary | ICD-10-CM

## 2022-04-04 LAB — CBC WITH DIFFERENTIAL (CANCER CENTER ONLY)
Abs Immature Granulocytes: 0.03 10*3/uL (ref 0.00–0.07)
Basophils Absolute: 0 10*3/uL (ref 0.0–0.1)
Basophils Relative: 0 %
Eosinophils Absolute: 0.1 10*3/uL (ref 0.0–0.5)
Eosinophils Relative: 1 %
HCT: 35 % — ABNORMAL LOW (ref 36.0–46.0)
Hemoglobin: 11.6 g/dL — ABNORMAL LOW (ref 12.0–15.0)
Immature Granulocytes: 0 %
Lymphocytes Relative: 13 %
Lymphs Abs: 1.1 10*3/uL (ref 0.7–4.0)
MCH: 30.9 pg (ref 26.0–34.0)
MCHC: 33.1 g/dL (ref 30.0–36.0)
MCV: 93.1 fL (ref 80.0–100.0)
Monocytes Absolute: 0.6 10*3/uL (ref 0.1–1.0)
Monocytes Relative: 8 %
Neutro Abs: 6.3 10*3/uL (ref 1.7–7.7)
Neutrophils Relative %: 78 %
Platelet Count: 229 10*3/uL (ref 150–400)
RBC: 3.76 MIL/uL — ABNORMAL LOW (ref 3.87–5.11)
RDW: 14.9 % (ref 11.5–15.5)
WBC Count: 8.1 10*3/uL (ref 4.0–10.5)
nRBC: 0 % (ref 0.0–0.2)

## 2022-04-04 LAB — CMP (CANCER CENTER ONLY)
ALT: 23 U/L (ref 0–44)
AST: 24 U/L (ref 15–41)
Albumin: 3.8 g/dL (ref 3.5–5.0)
Alkaline Phosphatase: 87 U/L (ref 38–126)
Anion gap: 5 (ref 5–15)
BUN: 6 mg/dL (ref 6–20)
CO2: 26 mmol/L (ref 22–32)
Calcium: 8.6 mg/dL — ABNORMAL LOW (ref 8.9–10.3)
Chloride: 108 mmol/L (ref 98–111)
Creatinine: 0.66 mg/dL (ref 0.44–1.00)
GFR, Estimated: >60 mL/min (ref >60)
Glucose, Bld: 93 mg/dL (ref 70–99)
Potassium: 4.3 mmol/L (ref 3.5–5.1)
Sodium: 139 mmol/L (ref 135–145)
Total Bilirubin: 0.2 mg/dL — ABNORMAL LOW (ref 0.3–1.2)
Total Protein: 6.3 g/dL — ABNORMAL LOW (ref 6.5–8.1)

## 2022-04-04 LAB — MAGNESIUM: Magnesium: 2.5 mg/dL — ABNORMAL HIGH (ref 1.7–2.4)

## 2022-04-04 LAB — PHOSPHORUS: Phosphorus: 3.4 mg/dL (ref 2.5–4.6)

## 2022-04-04 MED ORDER — SODIUM CHLORIDE 0.9 % IV SOLN
150.0000 mg | Freq: Once | INTRAVENOUS | Status: AC
  Administered 2022-04-04: 150 mg via INTRAVENOUS
  Filled 2022-04-04: qty 150

## 2022-04-04 MED ORDER — SODIUM CHLORIDE 0.9 % IV SOLN: Freq: Once | INTRAVENOUS | Status: AC

## 2022-04-04 MED ORDER — FAMOTIDINE IN NACL 20-0.9 MG/50ML-% IV SOLN
20.0000 mg | Freq: Once | INTRAVENOUS | Status: AC
  Administered 2022-04-04: 20 mg via INTRAVENOUS
  Filled 2022-04-04: qty 50

## 2022-04-04 MED ORDER — SODIUM CHLORIDE 0.9 % IV SOLN
10.0000 mg | Freq: Once | INTRAVENOUS | Status: AC
  Administered 2022-04-04: 10 mg via INTRAVENOUS
  Filled 2022-04-04: qty 10

## 2022-04-04 MED ORDER — SODIUM CHLORIDE 0.9% FLUSH
10.0000 mL | INTRAVENOUS | Status: DC | PRN
  Administered 2022-04-04: 10 mL

## 2022-04-04 MED ORDER — ACETAMINOPHEN 325 MG PO TABS
650.0000 mg | ORAL_TABLET | Freq: Once | ORAL | Status: AC
  Administered 2022-04-04: 650 mg via ORAL
  Filled 2022-04-04: qty 2

## 2022-04-04 MED ORDER — HEPARIN SOD (PORK) LOCK FLUSH 100 UNIT/ML IV SOLN
500.0000 [IU] | Freq: Once | INTRAVENOUS | Status: AC | PRN
  Administered 2022-04-04: 500 [IU]

## 2022-04-04 MED ORDER — DIPHENHYDRAMINE HCL 50 MG/ML IJ SOLN
50.0000 mg | Freq: Once | INTRAMUSCULAR | Status: AC
  Administered 2022-04-04: 50 mg via INTRAVENOUS
  Filled 2022-04-04: qty 1

## 2022-04-04 MED ORDER — SODIUM CHLORIDE 0.9% FLUSH
10.0000 mL | Freq: Once | INTRAVENOUS | Status: AC
  Administered 2022-04-04: 10 mL

## 2022-04-04 MED ORDER — PALONOSETRON HCL INJECTION 0.25 MG/5ML
0.2500 mg | Freq: Once | INTRAVENOUS | Status: AC
  Administered 2022-04-04: 0.25 mg via INTRAVENOUS
  Filled 2022-04-04: qty 5

## 2022-04-04 MED ORDER — SODIUM CHLORIDE 0.9 % IV SOLN
10.0000 mg/kg | Freq: Once | INTRAVENOUS | Status: AC
  Administered 2022-04-04: 720 mg via INTRAVENOUS
  Filled 2022-04-04: qty 72

## 2022-04-04 MED ORDER — ATROPINE SULFATE 1 MG/ML IV SOLN
0.5000 mg | Freq: Once | INTRAVENOUS | Status: AC | PRN
  Administered 2022-04-04: 0.5 mg via INTRAVENOUS
  Filled 2022-04-04: qty 1

## 2022-04-04 NOTE — Progress Notes (Signed)
[facility] CSW Progress Note  Holiday representative met with pt in infusion. Pt provided update on situation with her ex and the house. She is seeking advice with [facility] and is pleased with them so far.  Pt otherwise reports to be doing very well. She is dating someone who she has been friends with since college and is finding living with her sister to be mutually beneficially. She feels much more relaxed and is enjoying having alone time to recharge when needed.  CSW will be available as needed and pt will also have check-ins with spiritual care as planned.    Calven Gilkes E Faline Langer, LCSW    Patient is participating in a Managed Medicaid Plan:  Yes

## 2022-04-04 NOTE — Patient Instructions (Signed)
[facility]  Discharge Instructions: Thank you for choosing [facility] to provide your oncology and hematology care.   If you have a lab appointment with the [facility], please go directly to the [facility] and check in at the registration area.   Wear comfortable clothing and clothing appropriate for easy access to any Portacath or PICC line.   We strive to give you quality time with your provider. You may need to reschedule your appointment if you arrive late (15 or more minutes).  Arriving late affects you and other patients whose appointments are after yours.  Also, if you miss three or more appointments without notifying the office, you may be dismissed from the clinic at the provider's discretion.      For prescription refill requests, have your pharmacy contact our office and allow 72 hours for refills to be completed.    Today you received the following chemotherapy and/or immunotherapy agents: Trodelvy.      To help prevent nausea and vomiting after your treatment, we encourage you to take your nausea medication as directed.  BELOW ARE SYMPTOMS THAT SHOULD BE REPORTED IMMEDIATELY: *FEVER GREATER THAN 100.4 F (38 C) OR HIGHER *CHILLS OR SWEATING *NAUSEA AND VOMITING THAT IS NOT CONTROLLED WITH YOUR NAUSEA MEDICATION *UNUSUAL SHORTNESS OF BREATH *UNUSUAL BRUISING OR BLEEDING *URINARY PROBLEMS (pain or burning when urinating, or frequent urination) *BOWEL PROBLEMS (unusual diarrhea, constipation, pain near the anus) TENDERNESS IN MOUTH AND THROAT WITH OR WITHOUT PRESENCE OF ULCERS (sore throat, sores in mouth, or a toothache) UNUSUAL RASH, SWELLING OR PAIN  UNUSUAL VAGINAL DISCHARGE OR ITCHING   Items with * indicate a potential emergency and should be followed up as soon as possible or go to the Emergency Department if any problems should occur.  Please show the CHEMOTHERAPY ALERT CARD or IMMUNOTHERAPY ALERT CARD at  check-in to the Emergency Department and triage nurse.  Should you have questions after your visit or need to cancel or reschedule your appointment, please contact [facility]  Dept: [phone]  and follow the prompts.  Office hours are 8:00 a.m. to 4:30 p.m. Monday - Friday. Please note that voicemails left after 4:00 p.m. may not be returned until the following business day.  We are closed weekends and major holidays. You have access to a nurse at all times for urgent questions. Please call the main number to the clinic Dept: [phone] and follow the prompts.   For any non-urgent questions, you may also contact your provider using MyChart. We now offer e-Visits for anyone 10 and older to request care online for non-urgent symptoms. For details visit [email or web address].   Also download the MyChart app! Go to the app store, search "MyChart", open the app, select [facility], and log in with your MyChart username and password.

## 2022-04-04 NOTE — Assessment & Plan Note (Signed)
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, FISH positive ratio 2.8, Ki-67 25%, lymph node positive   Palliative radiation completed 09/03/2020 Taxotere/Herceptin/Perjeta 08/31/2020-12/18/2020 Herceptin Perjeta Maintenance  beginning 01/08/2021-08/27/2021 Enhertu: 09/17/2021-10/29/2021: Discontinued for progression Carboplatin and gemcitabine 11/19/2021-01/15/2022: Discontinued for progression   Biopsy of the breast at [facility] on 08/26/2021: IDC with lobular 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 Nausea and vomiting: Patient was prescribed Zofran it was very severe for 3 days after chemo. Diarrhea: Resolved in 1 to 2 days with the Lomotil.   Our plan is to administer Neulasta on day 9 of each cycle She will also need IV fluids on day 8 Scans will be ordered to be done after the cycle  Return to clinic days 1 and 8 every 3 weeks for Trodelvy.

## 2022-04-05 ENCOUNTER — Inpatient Hospital Stay: Payer: Medicaid Other

## 2022-04-05 VITALS — BP 100/65 | HR 92 | Temp 97.2°F | Resp 17

## 2022-04-05 DIAGNOSIS — C50912 Malignant neoplasm of unspecified site of left female breast: Secondary | ICD-10-CM

## 2022-04-05 DIAGNOSIS — C50919 Malignant neoplasm of unspecified site of unspecified female breast: Secondary | ICD-10-CM

## 2022-04-05 DIAGNOSIS — Z95828 Presence of other vascular implants and grafts: Secondary | ICD-10-CM

## 2022-04-05 DIAGNOSIS — Z5111 Encounter for antineoplastic chemotherapy: Secondary | ICD-10-CM | POA: Diagnosis not present

## 2022-04-05 MED ORDER — ALBUTEROL SULFATE HFA 108 (90 BASE) MCG/ACT IN AERS: 2.0000 | INHALATION_SPRAY | Freq: Once | RESPIRATORY_TRACT | Status: DC | PRN

## 2022-04-05 MED ORDER — EPINEPHRINE 0.3 MG/0.3ML IJ SOAJ: 0.3000 mg | Freq: Once | INTRAMUSCULAR | Status: DC | PRN

## 2022-04-05 MED ORDER — METHYLPREDNISOLONE SODIUM SUCC 125 MG IJ SOLR: 125.0000 mg | Freq: Once | INTRAMUSCULAR | Status: DC | PRN

## 2022-04-05 MED ORDER — SODIUM CHLORIDE 0.9% FLUSH
10.0000 mL | Freq: Once | INTRAVENOUS | Status: AC
  Administered 2022-04-05: 10 mL

## 2022-04-05 MED ORDER — DIPHENHYDRAMINE HCL 50 MG/ML IJ SOLN: 50.0000 mg | Freq: Once | INTRAMUSCULAR | Status: DC | PRN

## 2022-04-05 MED ORDER — FAMOTIDINE IN NACL 20-0.9 MG/50ML-% IV SOLN: 20.0000 mg | Freq: Once | INTRAVENOUS | Status: DC | PRN

## 2022-04-05 MED ORDER — PEGFILGRASTIM-CBQV 6 MG/0.6ML ~~LOC~~ SOSY: 6.0000 mg | PREFILLED_SYRINGE | Freq: Once | SUBCUTANEOUS | Status: DC

## 2022-04-05 MED ORDER — HEPARIN SOD (PORK) LOCK FLUSH 100 UNIT/ML IV SOLN
500.0000 [IU] | Freq: Once | INTRAVENOUS | Status: AC
  Administered 2022-04-05: 500 [IU]

## 2022-04-05 MED ORDER — SODIUM CHLORIDE 0.9 % IV SOLN: INTRAVENOUS | Status: DC

## 2022-04-05 MED ORDER — PEGFILGRASTIM-CBQV 6 MG/0.6ML ~~LOC~~ SOSY
6.0000 mg | PREFILLED_SYRINGE | Freq: Once | SUBCUTANEOUS | Status: AC
  Administered 2022-04-05: 6 mg via SUBCUTANEOUS

## 2022-04-05 MED ORDER — SODIUM CHLORIDE 0.9 % IV SOLN: Freq: Once | INTRAVENOUS | Status: DC | PRN

## 2022-04-05 NOTE — Patient Instructions (Signed)
Dehydration, Adult Dehydration is a condition in which there is not enough water or other fluids in the body. This happens when a person loses more fluids than he or she takes in. Important organs, such as the kidneys, brain, and heart, cannot function without a proper amount of fluids. Any loss of fluids from the body can lead to dehydration. Dehydration can be mild, moderate, or severe. It should be treated right away to prevent it from becoming severe. What are the causes? Dehydration may be caused by: Conditions that cause loss of water or other fluids, such as diarrhea, vomiting, or sweating or urinating a lot. Not drinking enough fluids, especially when you are ill or doing activities that require a lot of energy. Other illnesses and conditions, such as fever or infection. Certain medicines, such as medicines that remove excess fluid from the body (diuretics). Lack of safe drinking water. Not being able to get enough water and food. What increases the risk? The following factors may make you more likely to develop this condition: Having a long-term (chronic) illness that has not been treated properly, such as diabetes, heart disease, or kidney disease. Being 65 years of age or older. Having a disability. Living in a place that is high in altitude, where thinner, drier air causes more fluid loss. Doing exercises that put stress on your body for a long time (endurance sports). What are the signs or symptoms? Symptoms of dehydration depend on how severe it is. Mild or moderate dehydration Thirst. Dry lips or dry mouth. Dizziness or light-headedness, especially when standing up from a seated position. Muscle cramps. Dark urine. Urine may be the color of tea. Less urine or tears produced than usual. Headache. Severe dehydration Changes in skin. Your skin may be cold and clammy, blotchy, or pale. Your skin also may not return to normal after being lightly pinched and released. Little or  no tears, urine, or sweat. Changes in vital signs, such as rapid breathing and low blood pressure. Your pulse may be weak or may be faster than 100 beats a minute when you are sitting still. Other changes, such as: Feeling very thirsty. Sunken eyes. Cold hands and feet. Confusion. Being very tired (lethargic) or having trouble waking from sleep. Short-term weight loss. Loss of consciousness. How is this diagnosed? This condition is diagnosed based on your symptoms and a physical exam. You may have blood and urine tests to help confirm the diagnosis. How is this treated? Treatment for this condition depends on how severe it is. Treatment should be started right away. Do not wait until dehydration becomes severe. Severe dehydration is an emergency and needs to be treated in a hospital. Mild or moderate dehydration can be treated at home. You may be asked to: Drink more fluids. Drink an oral rehydration solution (ORS). This drink helps restore proper amounts of fluids and salts and minerals in the blood (electrolytes). Severe dehydration can be treated: With IV fluids. By correcting abnormal levels of electrolytes. This is often done by giving electrolytes through a tube that is passed through your nose and into your stomach (nasogastric tube, or NG tube). By treating the underlying cause of dehydration. Follow these instructions at home: Oral rehydration solution If told by your health care provider, drink an ORS: Make an ORS by following instructions on the package. Start by drinking small amounts, about  cup (120 mL) every 5-10 minutes. Slowly increase how much you drink until you have taken the amount recommended by your health   care provider. Eating and drinking        Drink enough clear fluid to keep your urine pale yellow. If you were told to drink an ORS, finish the ORS first and then start slowly drinking other clear fluids. Drink fluids such as: Water. Do not drink only  water. Doing that can lead to hyponatremia, which is having too little salt (sodium) in the body. Water from ice chips you suck on. Fruit juice that you have added water to (diluted fruit juice). Low-calorie sports drinks. Eat foods that contain a healthy balance of electrolytes, such as bananas, oranges, potatoes, tomatoes, and spinach. Do not drink alcohol. Avoid the following: Drinks that contain a lot of sugar. These include high-calorie sports drinks, fruit juice that is not diluted, and soda. Caffeine. Foods that are greasy or contain a lot of fat or sugar. General instructions Take over-the-counter and prescription medicines only as told by your health care provider. Do not take sodium tablets. Doing that can lead to having too much sodium in the body (hypernatremia). Return to your normal activities as told by your health care provider. Ask your health care provider what activities are safe for you. Keep all follow-up visits as told by your health care provider. This is important. Contact a health care provider if: You have muscle cramps, pain, or discomfort, such as: Pain in your abdomen and the pain gets worse or stays in one area (localizes). Stiff neck. You have a rash. You are more irritable than usual. You are sleepier or have a harder time waking than usual. You feel weak or dizzy. You feel very thirsty. Get help right away if you have: Any symptoms of severe dehydration. Symptoms of vomiting, such as: You cannot eat or drink without vomiting. Vomiting gets worse or does not go away. Vomit includes blood or green matter (bile). Symptoms that get worse with treatment. A fever. A severe headache. Problems with urination or bowel movements, such as: Diarrhea that gets worse or does not go away. Blood in your stool (feces). This may cause stool to look black and tarry. Not urinating, or urinating only a small amount of very dark urine, within 6-8 hours. Trouble  breathing. These symptoms may represent a serious problem that is an emergency. Do not wait to see if the symptoms will go away. Get medical help right away. Call your local emergency services (911 in the [place] Do not drive yourself to the hospital. Summary Dehydration is a condition in which there is not enough water or other fluids in the body. This happens when a person loses more fluids than he or she takes in. Treatment for this condition depends on how severe it is. Treatment should be started right away. Do not wait until dehydration becomes severe. Drink enough clear fluid to keep your urine pale yellow. If you were told to drink an oral rehydration solution (ORS), finish the ORS first and then start slowly drinking other clear fluids. Take over-the-counter and prescription medicines only as told by your health care provider. Get help right away if you have any symptoms of severe dehydration. This information is not intended to replace advice given to you by your health care provider. Make sure you discuss any questions you have with your health care provider. Document Revised: 06/12/2021 Document Reviewed: 09/16/2018 Elsevier Patient Education  [place].  Pegfilgrastim Injection What is this medication? PEGFILGRASTIM (PEG fil gra stim) lowers the risk of infection in people who are receiving chemotherapy. It  works by Building control surveyor make more white blood cells, which protects your body from infection. It may also be used to help people who have been exposed to high doses of radiation. This medicine may be used for other purposes; ask your health care provider or pharmacist if you have questions. COMMON BRAND NAME(S): [place] [employer], Neulasta, Nyvepria, Stimufend, UDENYCA, Ziextenzo What should I tell my care team before I take this medication? They need to know if you have any of these conditions: Kidney disease Latex allergy Ongoing radiation therapy Sickle cell  disease Skin reactions to acrylic adhesives (On-Body Injector only) An unusual or allergic reaction to pegfilgrastim, filgrastim, other medications, foods, dyes, or preservatives Pregnant or trying to get pregnant Breast-feeding How should I use this medication? This medication is for injection under the skin. If you get this medication at home, you will be taught how to prepare and give the pre-filled syringe or how to use the On-body Injector. Refer to the patient Instructions for Use for detailed instructions. Use exactly as directed. Tell your care team immediately if you suspect that the On-body Injector may not have performed as intended or if you suspect the use of the On-body Injector resulted in a missed or partial dose. It is important that you put your used needles and syringes in a special sharps container. Do not put them in a trash can. If you do not have a sharps container, call your pharmacist or care team to get one. Talk to your care team about the use of this medication in children. While this medication may be prescribed for selected conditions, precautions do apply. Overdosage: If you think you have taken too much of this medicine contact a poison control center or emergency room at once. NOTE: This medicine is only for you. Do not share this medicine with others. What if I miss a dose? It is important not to miss your dose. Call your care team if you miss your dose. If you miss a dose due to an On-body Injector failure or leakage, a new dose should be administered as soon as possible using a single prefilled syringe for manual use. What may interact with this medication? Interactions have not been studied. This list may not describe all possible interactions. Give your health care provider a list of all the medicines, herbs, non-prescription drugs, or dietary supplements you use. Also tell them if you smoke, drink alcohol, or use illegal drugs. Some items may interact with your  medicine. What should I watch for while using this medication? Your condition will be monitored carefully while you are receiving this medication. You may need blood work done while you are taking this medication. Talk to your care team about your risk of cancer. You may be more at risk for certain types of cancer if you take this medication. If you are going to need a MRI, CT scan, or other procedure, tell your care team that you are using this medication (On-Body Injector only). What side effects may I notice from receiving this medication? Side effects that you should report to your care team as soon as possible: Allergic reactions--skin rash, itching, hives, swelling of the face, lips, tongue, or throat Capillary leak syndrome--stomach or muscle pain, unusual weakness or fatigue, feeling faint or lightheaded, decrease in the amount of urine, swelling of the ankles, hands, or feet, trouble breathing High white blood cell level--fever, fatigue, trouble breathing, night sweats, change in vision, weight loss Inflammation of the aorta--fever, fatigue, back,  chest, or stomach pain, severe headache Kidney injury (glomerulonephritis)--decrease in the amount of urine, red or dark brown urine, foamy or bubbly urine, swelling of the ankles, hands, or feet Shortness of breath or trouble breathing Spleen injury--pain in upper left stomach or shoulder Unusual bruising or bleeding Side effects that usually do not require medical attention (report to your care team if they continue or are bothersome): Bone pain Pain in the hands or feet This list may not describe all possible side effects. Call your doctor for medical advice about side effects. You may report side effects to FDA at 1-800-FDA-1088. Where should I keep my medication? Keep out of the reach of children. If you are using this medication at home, you will be instructed on how to store it. Throw away any unused medication after the expiration  date on the label. NOTE: This sheet is a summary. It may not cover all possible information. If you have questions about this medicine, talk to your doctor, pharmacist, or health care provider.  2023 Elsevier/Gold Standard (2020-08-23 00:00:00)

## 2022-04-08 ENCOUNTER — Other Ambulatory Visit: Payer: Self-pay | Admitting: Adult Health

## 2022-04-08 MED ORDER — AZITHROMYCIN 250 MG PO TABS: ORAL_TABLET | ORAL | 0 refills | Status: DC

## 2022-04-09 ENCOUNTER — Inpatient Hospital Stay (HOSPITAL_BASED_OUTPATIENT_CLINIC_OR_DEPARTMENT_OTHER): Payer: Medicaid Other | Admitting: Hematology and Oncology

## 2022-04-09 ENCOUNTER — Other Ambulatory Visit: Payer: Self-pay | Admitting: Adult Health

## 2022-04-09 ENCOUNTER — Ambulatory Visit: Payer: Medicaid Other

## 2022-04-09 ENCOUNTER — Other Ambulatory Visit: Payer: Self-pay

## 2022-04-09 ENCOUNTER — Inpatient Hospital Stay: Payer: Medicaid Other

## 2022-04-09 VITALS — BP 117/75 | HR 72 | Resp 18 | Ht 65.0 in | Wt 144.6 lb

## 2022-04-09 DIAGNOSIS — C7951 Secondary malignant neoplasm of bone: Secondary | ICD-10-CM | POA: Diagnosis not present

## 2022-04-09 DIAGNOSIS — Z5111 Encounter for antineoplastic chemotherapy: Secondary | ICD-10-CM | POA: Diagnosis not present

## 2022-04-09 DIAGNOSIS — C50912 Malignant neoplasm of unspecified site of left female breast: Secondary | ICD-10-CM | POA: Diagnosis not present

## 2022-04-09 DIAGNOSIS — J029 Acute pharyngitis, unspecified: Secondary | ICD-10-CM

## 2022-04-09 LAB — RESPIRATORY PANEL BY PCR

## 2022-04-09 LAB — CMP (CANCER CENTER ONLY)
ALT: 25 U/L (ref 0–44)
AST: 27 U/L (ref 15–41)
Albumin: 4.1 g/dL (ref 3.5–5.0)
Alkaline Phosphatase: 118 U/L (ref 38–126)
Anion gap: 5 (ref 5–15)
BUN: 11 mg/dL (ref 6–20)
CO2: 28 mmol/L (ref 22–32)
Calcium: 9.2 mg/dL (ref 8.9–10.3)
Chloride: 106 mmol/L (ref 98–111)
Creatinine: 0.64 mg/dL (ref 0.44–1.00)
GFR, Estimated: >60 mL/min (ref >60)
Glucose, Bld: 93 mg/dL (ref 70–99)
Potassium: 4.2 mmol/L (ref 3.5–5.1)
Sodium: 139 mmol/L (ref 135–145)
Total Bilirubin: 0.4 mg/dL (ref 0.3–1.2)
Total Protein: 7 g/dL (ref 6.5–8.1)

## 2022-04-09 LAB — CBC WITH DIFFERENTIAL (CANCER CENTER ONLY)
Abs Immature Granulocytes: 0.03 10*3/uL (ref 0.00–0.07)
Basophils Absolute: 0.1 10*3/uL (ref 0.0–0.1)
Basophils Relative: 1 %
Eosinophils Absolute: 0.1 10*3/uL (ref 0.0–0.5)
Eosinophils Relative: 1 %
HCT: 39.4 % (ref 36.0–46.0)
Hemoglobin: 12.9 g/dL (ref 12.0–15.0)
Immature Granulocytes: 0 %
Lymphocytes Relative: 16 %
Lymphs Abs: 1.2 10*3/uL (ref 0.7–4.0)
MCH: 30.7 pg (ref 26.0–34.0)
MCHC: 32.7 g/dL (ref 30.0–36.0)
MCV: 93.8 fL (ref 80.0–100.0)
Monocytes Absolute: 0.7 10*3/uL (ref 0.1–1.0)
Monocytes Relative: 9 %
Neutro Abs: 5.2 10*3/uL (ref 1.7–7.7)
Neutrophils Relative %: 73 %
Platelet Count: 204 10*3/uL (ref 150–400)
RBC: 4.2 MIL/uL (ref 3.87–5.11)
RDW: 14.9 % (ref 11.5–15.5)
WBC Count: 7.2 10*3/uL (ref 4.0–10.5)
nRBC: 0 % (ref 0.0–0.2)

## 2022-04-09 LAB — GROUP A STREP BY PCR: Group A Strep by PCR: NOT DETECTED

## 2022-04-09 NOTE — Assessment & Plan Note (Signed)
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, FISH positive ratio 2.8, Ki-67 25%, lymph node positive   Palliative radiation completed 09/03/2020 Taxotere/Herceptin/Perjeta 08/31/2020-12/18/2020 Herceptin Perjeta Maintenance  beginning 01/08/2021-08/27/2021 Enhertu: 09/17/2021-10/29/2021: Discontinued for progression Carboplatin and gemcitabine 11/19/2021-01/15/2022: Discontinued for progression   Biopsy of the breast at [facility] on 08/26/2021: IDC with lobular 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 Nausea and vomiting: Patient was prescribed Zofran it was very severe for 3 days after chemo. Diarrhea: Resolved in 1 to 2 days with the Lomotil.   Throat/upper respiratory infection: I do not see any redness or eye discharge.  She does not have any temperature.  She was prescribed azithromycin by Mendel Ryder which she will be taking on her 2-week vacation.  I instructed her to start taking it if her symptoms worsen or if she develops fevers.

## 2022-04-09 NOTE — Progress Notes (Signed)
Orders placed for upper respiratory panel for patients Post nasal drip/sore throat x 4 days. She has been covid negative x 2. She received Trodelvy 5 days ago.  Tylenol is helping her sore throat.  She will undergo lab testing with CBC, CMET, blood culture, respiratory viral panel, rapid strep swab, and upper respiratory culture (from throat). This will be followed by visit with Dr. Lindi Adie for further evaluation.    I sent in a Zpak for her earlier this week she has not yet started to [place] mothers [place].    Wilber Bihari, NP 04/09/22 9:04 AM Medical Oncology and Hematology [facility] [place], [place] [address] Tel. [phone]    Fax. [phone]

## 2022-04-09 NOTE — Progress Notes (Signed)
Patient Care Team: Pcp, No as PCP - General [name], MD as Consulting Physician (Hematology and Oncology) [name], [name], NP as Nurse Practitioner (Hematology and Oncology) [name], MD as Consulting Physician (General Surgery) [name], MD as Consulting Physician (Radiation Oncology) [name], [name], NP as Nurse Practitioner (Nurse Practitioner) [name], [name], NP as Nurse Practitioner (Nurse Practitioner)  DIAGNOSIS:  Encounter Diagnosis  Name Primary?   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  Prominent left axillary lymph node cluster of adjacent lymph nodes, irregular nodule right middle lobe 1.1 cm, aggressive lesion right second rib cortical destruction, multiple lucent lesions throughout the thoracic spine including T1 vertebral body and T2, irregular multilobar lesion central left hepatic lobe 4.3 x 4.7 cm.  Several satellite lesions in the left lateral hepatic lobe.  Multiple sclerotic lesions in the bones iliac wings, acetabular, medial iliac bones, sacrum multiple lesions lumbar spine L3-L4 and L5   08/17/2020 Initial Diagnosis   Biopsy revealed IDC with DCIS grade 2, ER 30%, PR 20%, HER2 equivocal by IHC, FISH positive, Ki-67 25%, lymph node positive    Genetic Testing   Negative genetic testing:  No pathogenic variants detected on the [employer] CancerNext-Expanded + RNAinsight panel. The report date is 09/06/2020.   The CancerNext-Expanded + RNAinsight gene panel offered by [employer] and includes sequencing and rearrangement analysis for the following 77 genes: AIP, ALK, APC, ATM, AXIN2, BAP1, BARD1, BLM, BMPR1A, BRCA1, BRCA2, BRIP1, CDC73, CDH1, CDK4, CDKN1B, CDKN2A, CHEK2, CTNNA1, DICER1, FANCC, FH, FLCN, GALNT12, KIF1B,  LZTR1, MAX, MEN1, MET, MLH1, MSH2, MSH3, MSH6, MUTYH, NBN, NF1, NF2, NTHL1, PALB2, PHOX2B, PMS2, POT1, PRKAR1A, PTCH1, PTEN, RAD51C, RAD51D, RB1, RECQL, RET, SDHA, SDHAF2, SDHB, SDHC, SDHD, SMAD4, SMARCA4, SMARCB1, SMARCE1, STK11, SUFU, TMEM127, TP53, TSC1, TSC2, VHL and XRCC2 (sequencing and deletion/duplication); EGFR, EGLN1, HOXB13, KIT, MITF, PDGFRA, POLD1 and POLE (sequencing only); EPCAM and GREM1 (deletion/duplication only). RNA data is routinely analyzed for use in variant interpretation for all genes.   09/17/2021 - 10/07/2021 Chemotherapy   Patient is on Treatment Plan : BREAST METASTATIC fam-trastuzumab deruxtecan-nxki (Enhertu) q21d     09/17/2021 - 10/29/2021 Chemotherapy   Patient is on Treatment Plan : BREAST METASTATIC Fam-Trastuzumab Deruxtecan-nxki (Enhertu) (5.4) q21d     11/19/2021 - 01/15/2022 Chemotherapy   Patient is on Treatment Plan : BREAST Carboplatin + Gemcitabine D1,8 q21d     02/14/2022 -  Chemotherapy   Patient is on Treatment Plan : BREAST METASTATIC Sacituzumab govitecan-hziy Ellen [name]) D1,8 q21d     Carcinoma of breast metastatic to bone ([place])  08/17/2020 Initial Diagnosis   Carcinoma of breast metastatic to bone ([place]); Goserelin/Zoladex started and to be given every 4 weeks    Genetic Testing   Negative genetic testing:  No pathogenic variants detected on the [employer] CancerNext-Expanded + RNAinsight panel. The report date is 09/06/2020.   The CancerNext-Expanded + RNAinsight gene panel offered by [employer] and includes sequencing and rearrangement analysis for the following 77 genes: AIP, ALK, APC, ATM, AXIN2, BAP1, BARD1, BLM, BMPR1A, BRCA1, BRCA2, BRIP1, CDC73, CDH1, CDK4, CDKN1B, CDKN2A, CHEK2, CTNNA1, DICER1, FANCC, FH, FLCN, GALNT12, KIF1B, LZTR1, MAX, MEN1, MET, MLH1, MSH2, MSH3, MSH6, MUTYH, NBN, NF1, NF2, NTHL1, PALB2, PHOX2B, PMS2, POT1, PRKAR1A, PTCH1, PTEN, RAD51C, RAD51D, RB1, RECQL, RET, SDHA, SDHAF2, SDHB, SDHC,  SDHD, SMAD4, SMARCA4, SMARCB1, SMARCE1,  STK11, SUFU, TMEM127, TP53, TSC1, TSC2, VHL and XRCC2 (sequencing and deletion/duplication); EGFR, EGLN1, HOXB13, KIT, MITF, PDGFRA, POLD1 and POLE (sequencing only); EPCAM and GREM1 (deletion/duplication only). RNA data is routinely analyzed for use in variant interpretation for all genes.   08/18/2020 - 08/30/2020 Radiation Therapy   Palliative radiation to the lumbar and pelvic bone; 30 Gy in 10 fractions   08/31/2020 - 12/18/2020 Adjuvant Chemotherapy   Started with Herceptin/Perjeta on 08/31/2020 and Taxotere on 09/04/2020; she was hospitalized with dehydration and refractory diarrhea for 11 days, she resumed treatment with Taxotere (dose reduced by 34m/m2) and Herceptin only (perjeta discontinued) on 09/26/2020, reintroduced 10/17/2020   09/03/2020 Cancer Staging   Staging form: Breast, AJCC 8th Edition - Clinical stage from 09/03/2020: Stage IV (cT4b, cN1, cM1, G2, ER+, PR+, HER2+) - Signed by CGardenia Phlegm NP on 10/03/2020 Stage prefix: Initial diagnosis Method of lymph node assessment: Clinical Histologic grading system: 3 grade system   01/08/2021 - 08/27/2021 Adjuvant Chemotherapy   Herceptin/Perjeta every 3 weeks, Zometa every 12 weeks added 01/29/2021   07/07/2021 PET scan   1. Interval decrease in size of the dominant left breast mass which now exhibits mild low level FDG uptake with SUV max of 2.7. Small nodule within the left breast exhibits moderate tracer uptake within SUV max of 5.6 and is suspicious for residual/recurrent local tumor. 2. Mild tracer uptake associated with prominent left axillary lymph node with SUV max of 3.35. Residual tracer avid nodal metastasis not excluded. 3. The previously noted liver metastasis are no longer visible on the CT images from today's study. There is no abnormal increased uptake within the left hepatic lobe above background liver activity to suggest metabolically active tumor metastasis. 4. Interval increase in sclerosis associated  with previously noted lytic bone metastasis without significant increased uptake above background bone marrow activity compatible with healing multifocal bone metastasis. 5. New pathologic fracture involves the L3 vertebral body where there was extensive lytic changes noted on the previous CT. The collapsed vertebral body is now sclerotic with mild low level FDG uptake. 6. Similar appearance of soft tissue infiltration within the anterior mediastinal fat which exhibits mild FDG uptake on today's study. 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   07/16/2021 Surgery   S/p BSO, benign pathology    08/26/2021 Mammogram   Mammo diagnostic breast tomosynthesis left: There is an additional  irregular mass in the lower inner position at posterior depth. This was  further evaluated with ultrasound.   Mammo UKoreabreast limited left: There is a 0.7 cm x 0.5 cm x 0.7 cm  irregularly shaped, hypoechoic mass with indistinct margins seen in the  left breast at 8 o'clock, 8 cm from the nipple. This mass is 1.6 cm medial  to the palpable mass as detailed above.    08/26/2021 Pathology Results   New left breast mass 8:00 biopsy: Invasive carcinoma with mixed ductal and lobular and focal micropapillary features.  Grade 3, invasive carcinoma measures 9 mm with an associated lymphoid reaction.  ER negative PR negative HER2 1+.  Left breast mass 2:00 biopsy: Invasive carcinoma with mixed ductal and lobular features Nottingham grade 2 measuring 1.4 cm.  ER 9% (low positive), PR negative, HER2 1+.   09/16/2021 PET scan   1. Interval progression of hypermetabolic metastatic disease. 2. Hypermetabolic bilateral axillary lymphadenopathy, subcarinal and bilateral hilar lymphadenopathy, new/progressive. 3. New hypermetabolic nodularity in the lower left breast is compatible with progressive malignancy.  4. New hypermetabolic mixed lytic and sclerotic left pelvic bone metastases as detailed. 5.  Persistent patchy hypermetabolism in the anterior mediastinum associated with mixed soft tissue and fat density, favor thymic hyperplasia.   09/17/2021 - 10/07/2021 Chemotherapy   Patient is on Treatment Plan : BREAST METASTATIC fam-trastuzumab deruxtecan-nxki (Enhertu) q21d     09/17/2021 - 10/29/2021 Chemotherapy   Patient is on Treatment Plan : BREAST METASTATIC Fam-Trastuzumab Deruxtecan-nxki (Enhertu) (5.4) q21d     11/11/2021 PET scan    1. Hypermetabolic left breast soft tissue and skin thickening is increased  from prior examination and suspicious for worsening primary malignancy.   2. There is evidence of interval worsening hypermetabolic adenopathy.  Primary differential consideration is worsening nodal metastasis, however  given the distribution the possibility of sarcoid-like reaction in the  setting of therapy is raised. There is worsening hypermetabolic left  axillary adenopathy with new areas of hypermetabolic adenopathy seen in the  left subpectoral, internal mammary, cardiophrenic, right axillary,  mediastinal, and bilateral hilar nodal stations. A few nonspecific FDG avid  nonenlarged left pelvic/inguinal nodes are also seen. Tissue sampling  should be considered for definitive assessment, otherwise close follow-up  is recommended.   3. Interval increased hypermetabolic activity associated with osseous  metastasis in the left hemipelvis. Other nonavid sclerotic bone lesions may  reflect sites of treated disease.   Electronically Reviewed by:  Mahala Menghini, MD, [facility]  Electronically Reviewed on:  11/11/2021 6:13 PM    11/19/2021 - 01/15/2022 Chemotherapy   Patient is on Treatment Plan : BREAST Carboplatin + Gemcitabine D1,8 q21d     02/14/2022 -  Chemotherapy   Patient is on Treatment Plan : BREAST METASTATIC Sacituzumab govitecan-hziy Ellen [name]) D1,8 q21d       CHIEF COMPLIANT: Upper respiratory infection  INTERVAL HISTORY: Ellen [name] is a  28 year old with above-mentioned history metastatic breast cancer who came in today for urgent visit because she has sore throat and beginnings of an upper respiratory infection.  She is going away for 2 weeks to her [place] for a vacation and we needed to make sure that she is otherwise doing quite well.  She does not have any fevers or chills.  Does not have any arthralgias or myalgias.  She checked for COVID twice and both were negative.   ALLERGIES:  is allergic to other.  MEDICATIONS:  Current Outpatient Medications  Medication Sig Dispense Refill   acyclovir (ZOVIRAX) 800 MG tablet Take 1 tablet (800 mg total) by mouth 2 (two) times daily as needed (flare up). 60 tablet 5   acyclovir ointment (ZOVIRAX) 5 % Apply 1 application  topically 4 (four) times daily as needed (outbreak).     albuterol (VENTOLIN HFA) 108 (90 Base) MCG/ACT inhaler 2 puffs inhaled every 4-6 hours PRN 8 g 2   azithromycin (ZITHROMAX Z-PAK) 250 MG tablet Take 2 tab on day 1 then 1 tab daily until complete 6 each 0   Calcium Carb-Cholecalciferol 500-10 MG-MCG TABS Take by mouth.     diphenoxylate-atropine (LOMOTIL) 2.5-0.025 MG tablet Take 1 tablet by mouth 4 (four) times daily as needed for diarrhea or loose stools. 30 tablet 0   fluticasone (FLOVENT HFA) 44 MCG/ACT inhaler Inhale 2 puffs into the lungs in the morning and at bedtime. 1 each 12   gabapentin (NEURONTIN) 300 MG capsule Take 1 capsule (300 mg total) by mouth 3 (three) times daily. 90 capsule 3   lidocaine-prilocaine (EMLA) cream      lisdexamfetamine (VYVANSE)  40 MG capsule Take 1 capsule by mouth daily. (Patient not taking: Reported on 02/20/2022)     LORazepam (ATIVAN) 0.5 MG tablet Take 1 tablet (0.5 mg total) by mouth every 8 (eight) hours as needed for anxiety. 45 tablet 0   methylPREDNISolone (MEDROL DOSEPAK) 4 MG TBPK tablet Taper 6,5,4,3,2,1 21 tablet 0   ondansetron (ZOFRAN-ODT) 8 MG disintegrating tablet Dissolve 1 tablet (8 mg total) in mouth  every 8 (eight) hours as needed for nausea or vomiting. 20 tablet 0   pantoprazole (PROTONIX) 40 MG tablet Take 1 tablet (40 mg total) by mouth daily. 30 tablet 3   prochlorperazine (COMPAZINE) 10 MG tablet Take 1 tablet (10 mg total) by mouth every 6 (six) hours as needed for nausea or vomiting. 30 tablet 0   venlafaxine XR (EFFEXOR-XR) 150 MG 24 hr capsule Take 1 capsule (150 mg total) by mouth daily with breakfast. 30 capsule 6   No current facility-administered medications for this visit.    PHYSICAL EXAMINATION: ECOG PERFORMANCE STATUS: 1 - Symptomatic but completely ambulatory  Vitals:   04/09/22 1056  BP: 117/75  Pulse: 72  Resp: 18  SpO2: 96%   Filed Weights   04/09/22 1056  Weight: 144 lb 9.6 oz (65.6 kg)      LABORATORY DATA:  I have reviewed the data as listed    Latest Ref Rng & Units 04/09/2022   10:38 AM 04/04/2022    9:16 AM 03/21/2022    9:49 AM  CMP  Glucose 70 - 99 mg/dL 93  93  112   BUN 6 - 20 mg/dL 11  6  8   $ Creatinine 0.44 - 1.00 mg/dL 0.64  0.66  0.65   Sodium 135 - 145 mmol/L 139  139  139   Potassium 3.5 - 5.1 mmol/L 4.2  4.3  3.8   Chloride 98 - 111 mmol/L 106  108  106   CO2 22 - 32 mmol/L 28  26  27   $ Calcium 8.9 - 10.3 mg/dL 9.2  8.6  8.6   Total Protein 6.5 - 8.1 g/dL 7.0  6.3  6.1   Total Bilirubin 0.3 - 1.2 mg/dL 0.4  0.2  0.2   Alkaline Phos 38 - 126 U/L 118  87  66   AST 15 - 41 U/L 27  24  25   $ ALT 0 - 44 U/L 25  23  13     $ Lab Results  Component Value Date   WBC 7.2 04/09/2022   HGB 12.9 04/09/2022   HCT 39.4 04/09/2022   MCV 93.8 04/09/2022   PLT 204 04/09/2022   NEUTROABS 5.2 04/09/2022    ASSESSMENT & PLAN:  Carcinoma of breast metastatic to bone ([place]) 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, FISH positive ratio 2.8, Ki-67 25%, lymph node  positive   Palliative radiation completed 09/03/2020 Taxotere/Herceptin/Perjeta 08/31/2020-12/18/2020 Herceptin Perjeta Maintenance  beginning 01/08/2021-08/27/2021 Enhertu: 09/17/2021-10/29/2021: Discontinued for progression Carboplatin and gemcitabine 11/19/2021-01/15/2022: Discontinued for progression   Biopsy of the breast at [facility] on 08/26/2021: IDC with lobular features ER 90%, PR 0%, HER2 1+ (low) ------------------------------------------------------------------------------------------------------------------------------------ Current treatment: Trodelvy started 02/14/2022, today cycle 3-day 1 (postponing treatment by 1 week) Ellen [name] toxicities: Neutropenia: We have to hold today's treatment and postpone till next week. Nausea and vomiting: Patient was prescribed Zofran it  was very severe for 3 days after chemo. Diarrhea: Resolved in 1 to 2 days with the Lomotil.   Throat/upper respiratory infection: I do not see any redness or eye discharge.  She does not have any temperature.  She was prescribed azithromycin by Mendel Ryder which she will be taking on her 2-week vacation.  I instructed her to start taking it if her symptoms worsen or if she develops fevers.     No orders of the defined types were placed in this encounter.  The patient has a good understanding of the overall plan. she agrees with it. she will call with any problems that may develop before the next visit here. Total time spent: 30 mins including face to face time and time spent for planning, charting and co-ordination of care   Harriette Ohara, MD 04/09/22

## 2022-04-10 ENCOUNTER — Encounter: Payer: Self-pay | Admitting: General Practice

## 2022-04-10 NOTE — Progress Notes (Signed)
[facility] Note  Kushi Norton by phone to let her know that I will miss her next two treatments but plan to follow up in mid-March since she prefers in-person visits.   [place], [place], [facility] Pager [phone] Voicemail [phone]

## 2022-04-11 ENCOUNTER — Inpatient Hospital Stay: Payer: Medicaid Other

## 2022-04-11 ENCOUNTER — Inpatient Hospital Stay: Payer: Medicaid Other | Admitting: Adult Health

## 2022-04-11 LAB — CULTURE, GROUP A STREP (THRC)

## 2022-04-12 ENCOUNTER — Inpatient Hospital Stay: Payer: Medicaid Other

## 2022-04-14 LAB — CULTURE, BLOOD (SINGLE): Culture: NO GROWTH

## 2022-04-17 NOTE — Progress Notes (Signed)
[facility]  [phone]) [phone] [phone]) [phone]   Name: Ellen [name] Date: 04/17/2022 MRN: [id number]  DOB: 29-Dec-1995  Patient Care Team: Pcp, No as PCP - General [name], MD as Consulting Physician (Hematology and Oncology) Delice Bison, [name], NP as Nurse Practitioner (Hematology and Oncology) [name], MD as Consulting Physician (General Surgery) [name], MD as Consulting Physician (Radiation Oncology) [name], [name], NP as Nurse Practitioner (Nurse Practitioner) [name], [name], NP as Nurse Practitioner (Nurse Practitioner)    INTERVAL HISTORY: Ellen [name] is a 27 y.o. 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  Palliative ask to see for symptom management and goals of care.   SOCIAL HISTORY:     reports that she has never smoked. She has never used smokeless tobacco. She reports that she does not currently use alcohol. She reports current drug use. Drug: Marijuana.  ADVANCE DIRECTIVES:    CODE STATUS: Full code  PAST MEDICAL HISTORY: Past Medical History:  Diagnosis Date   ADHD (attention deficit hyperactivity disorder)    Anemia    Anxiety    Asthma    Exercise Induced   Cancer ([place])    Depression    Family history of pancreatic cancer    Family history of thyroid cancer    GERD (gastroesophageal reflux disease)    Herpes simplex    Personal history of chemotherapy 08/2020    ALLERGIES:  is allergic to other.  MEDICATIONS:  Current Outpatient Medications  Medication Sig Dispense Refill   acyclovir (ZOVIRAX) 800 MG tablet Take 1 tablet (800 mg total) by mouth 2 (two) times daily as needed (flare up). 60 tablet 5   acyclovir ointment (ZOVIRAX) 5 % Apply  1 application  topically 4 (four) times daily as needed (outbreak).     albuterol (VENTOLIN HFA) 108 (90 Base) MCG/ACT inhaler 2 puffs inhaled every 4-6 hours PRN 8 g 2   azithromycin (ZITHROMAX Z-PAK) 250 MG tablet Take 2 tab on day 1 then 1 tab daily until complete 6 each 0   Calcium Carb-Cholecalciferol 500-10 MG-MCG TABS Take by mouth.     diphenoxylate-atropine (LOMOTIL) 2.5-0.025 MG tablet Take 1 tablet by mouth 4 (four) times daily as needed for diarrhea or loose stools. 30 tablet 0   fluticasone (FLOVENT HFA) 44 MCG/ACT inhaler Inhale 2 puffs into the lungs in the morning and at bedtime. 1 each 12   gabapentin (NEURONTIN) 300 MG capsule Take 1 capsule (300 mg total) by mouth 3 (three) times daily. 90 capsule 3   lidocaine-prilocaine (EMLA) cream      lisdexamfetamine (VYVANSE) 40 MG capsule Take 1 capsule by mouth daily. (Patient not taking: Reported on 02/20/2022)     LORazepam (ATIVAN) 0.5 MG tablet Take 1 tablet (0.5 mg total) by mouth every 8 (eight) hours as needed for anxiety. 45 tablet 0   methylPREDNISolone (MEDROL DOSEPAK) 4 MG TBPK tablet Taper 6,5,4,3,2,1 21 tablet 0   ondansetron (ZOFRAN-ODT) 8 MG disintegrating tablet Dissolve 1 tablet (8 mg total) in mouth every 8 (eight) hours as needed for nausea or vomiting. 20 tablet 0   pantoprazole (PROTONIX) 40 MG tablet Take 1 tablet (40 mg total) by mouth daily. 30 tablet 3   prochlorperazine (COMPAZINE) 10 MG tablet Take 1 tablet (10 mg total) by mouth every  6 (six) hours as needed for nausea or vomiting. 30 tablet 0   venlafaxine XR (EFFEXOR-XR) 150 MG 24 hr capsule Take 1 capsule (150 mg total) by mouth daily with breakfast. 30 capsule 6   No current facility-administered medications for this visit.    VITAL SIGNS: There were no vitals taken for this visit. There were no vitals filed for this visit.  Estimated body mass index is 24.06 kg/m as calculated from the following:   Height as of 04/09/22: '5\' 5"'$  (1.651 m).   Weight as  of 04/09/22: 144 lb 9.6 oz (65.6 kg).   PERFORMANCE STATUS (ECOG) : 1 - Symptomatic but completely ambulatory   Physical Exam General: NAD Cardiovascular: regular rate and rhythm Pulmonary: normal breathing pattern  Extremities: no edema, no joint deformities Skin: no rashes, chemo induced alopecia, left breast changes Neurological: AAO x3, mood appropriate   IMPRESSION: Ellen [name] presents to clinic today for follow-up. No acute distress. Reports overall taking things one day at a time. Scheduled for treatment today.   Denies nausea, vomiting, constipation, or diarrhea. Pain is well managed with daily use of gabapentin. Some days are better than others. Effexor being adjusted to assist with feelings of depression.   We discussed overall emotional state. Ellen [name] is tearful expressing her ability to cope with her diagnosis and the fear of the unknown future. She shares feelings of wanting to be around her family and friends for support but also wanting to be alone in a quiet setting. She feels better around her support system but does not always want to talk about "how she is doing" or what is going on in their normal lives. Emotional support provided. I shared expressing her feelings with her family and friends encouraging to be around but with no intentions of talking but for quiet presence.   She shares thoughts at times of continuing treatment knowing that she will not be better and things have progressed despite all efforts. She feels at time lose of sense of who she is and why she is encountering this journey. I created space and opportunity allowing her to openly share her thoughts and feelings.  We discussed her life story and being present and alive to be able to not only live but share her story with others providing motivation to those that may not have the strength that she carries.  I encouraged her to consider joining support groups here at the cancer center. I am sure she would be  a light to someone as well gain support from those going through a similar journey as she is. Ellen [name] verbalized understanding and expressed interest. She has monthly calendar or classes and other patient centered activities.   We discussed continuing to take life one day at a time, focusing on what matters to her most, and reassuring that she is a beautiful person with a purpose driven life.   I discussed the importance of continued conversation with family and their medical providers regarding overall plan of care and treatment options, ensuring decisions are within the context of the patients values and GOCs.  PLAN:  Ongoing goals of care and support as needed Gabapentin '300mg'$  three times daily. Tolerating well. Pain improved.  I will plan to see patient back in 2-3 weeks in collaboration to other oncology appointments.    Patient expressed understanding and was in agreement with this plan. She also understands that She can call the clinic at any time with any questions, concerns, or complaints.  Time Total: 35 min   Visit consisted of counseling and education dealing with the complex and emotionally intense issues of symptom management and palliative care in the setting of serious and potentially life-threatening illness.Greater than 50%  of this time was spent counseling and coordinating care related to the above assessment and plan.  Alda Lea, AGPCNP-BC  Palliative Medicine Team/[place]

## 2022-04-18 ENCOUNTER — Encounter: Payer: Self-pay | Admitting: Adult Health

## 2022-04-18 ENCOUNTER — Inpatient Hospital Stay: Payer: Medicaid Other | Attending: Adult Health

## 2022-04-18 ENCOUNTER — Inpatient Hospital Stay (HOSPITAL_BASED_OUTPATIENT_CLINIC_OR_DEPARTMENT_OTHER): Payer: Medicaid Other | Admitting: Nurse Practitioner

## 2022-04-18 ENCOUNTER — Encounter: Payer: Self-pay | Admitting: Hematology and Oncology

## 2022-04-18 ENCOUNTER — Other Ambulatory Visit: Payer: Self-pay | Admitting: *Deleted

## 2022-04-18 ENCOUNTER — Inpatient Hospital Stay: Payer: Medicaid Other

## 2022-04-18 ENCOUNTER — Encounter: Payer: Self-pay | Admitting: Nurse Practitioner

## 2022-04-18 ENCOUNTER — Inpatient Hospital Stay (HOSPITAL_BASED_OUTPATIENT_CLINIC_OR_DEPARTMENT_OTHER): Payer: Medicaid Other | Admitting: Adult Health

## 2022-04-18 VITALS — BP 99/80 | HR 97 | Temp 98.7°F | Resp 16

## 2022-04-18 DIAGNOSIS — R052 Subacute cough: Secondary | ICD-10-CM | POA: Insufficient documentation

## 2022-04-18 DIAGNOSIS — Z95828 Presence of other vascular implants and grafts: Secondary | ICD-10-CM

## 2022-04-18 DIAGNOSIS — Z923 Personal history of irradiation: Secondary | ICD-10-CM | POA: Insufficient documentation

## 2022-04-18 DIAGNOSIS — C7951 Secondary malignant neoplasm of bone: Secondary | ICD-10-CM | POA: Insufficient documentation

## 2022-04-18 DIAGNOSIS — J4520 Mild intermittent asthma, uncomplicated: Secondary | ICD-10-CM | POA: Diagnosis not present

## 2022-04-18 DIAGNOSIS — Z515 Encounter for palliative care: Secondary | ICD-10-CM | POA: Diagnosis not present

## 2022-04-18 DIAGNOSIS — C50912 Malignant neoplasm of unspecified site of left female breast: Secondary | ICD-10-CM

## 2022-04-18 DIAGNOSIS — Z7189 Other specified counseling: Secondary | ICD-10-CM

## 2022-04-18 DIAGNOSIS — C787 Secondary malignant neoplasm of liver and intrahepatic bile duct: Secondary | ICD-10-CM | POA: Insufficient documentation

## 2022-04-18 DIAGNOSIS — C50919 Malignant neoplasm of unspecified site of unspecified female breast: Secondary | ICD-10-CM

## 2022-04-18 DIAGNOSIS — F419 Anxiety disorder, unspecified: Secondary | ICD-10-CM

## 2022-04-18 DIAGNOSIS — M792 Neuralgia and neuritis, unspecified: Secondary | ICD-10-CM | POA: Diagnosis not present

## 2022-04-18 DIAGNOSIS — K219 Gastro-esophageal reflux disease without esophagitis: Secondary | ICD-10-CM | POA: Diagnosis not present

## 2022-04-18 DIAGNOSIS — Z5111 Encounter for antineoplastic chemotherapy: Secondary | ICD-10-CM | POA: Insufficient documentation

## 2022-04-18 DIAGNOSIS — F17210 Nicotine dependence, cigarettes, uncomplicated: Secondary | ICD-10-CM | POA: Insufficient documentation

## 2022-04-18 DIAGNOSIS — Z800 Family history of malignant neoplasm of digestive organs: Secondary | ICD-10-CM | POA: Diagnosis not present

## 2022-04-18 LAB — CBC WITH DIFFERENTIAL (CANCER CENTER ONLY)
Abs Immature Granulocytes: 0.02 10*3/uL (ref 0.00–0.07)
Basophils Absolute: 0 10*3/uL (ref 0.0–0.1)
Basophils Relative: 0 %
Eosinophils Absolute: 0.1 10*3/uL (ref 0.0–0.5)
Eosinophils Relative: 2 %
HCT: 36.6 % (ref 36.0–46.0)
Hemoglobin: 12 g/dL (ref 12.0–15.0)
Immature Granulocytes: 0 %
Lymphocytes Relative: 12 %
Lymphs Abs: 0.9 10*3/uL (ref 0.7–4.0)
MCH: 30.5 pg (ref 26.0–34.0)
MCHC: 32.8 g/dL (ref 30.0–36.0)
MCV: 92.9 fL (ref 80.0–100.0)
Monocytes Absolute: 0.7 10*3/uL (ref 0.1–1.0)
Monocytes Relative: 9 %
Neutro Abs: 5.9 10*3/uL (ref 1.7–7.7)
Neutrophils Relative %: 77 %
Platelet Count: 232 10*3/uL (ref 150–400)
RBC: 3.94 MIL/uL (ref 3.87–5.11)
RDW: 14.7 % (ref 11.5–15.5)
WBC Count: 7.7 10*3/uL (ref 4.0–10.5)
nRBC: 0 % (ref 0.0–0.2)

## 2022-04-18 LAB — CMP (CANCER CENTER ONLY)
ALT: 31 U/L (ref 0–44)
AST: 39 U/L (ref 15–41)
Albumin: 3.8 g/dL (ref 3.5–5.0)
Alkaline Phosphatase: 95 U/L (ref 38–126)
Anion gap: 6 (ref 5–15)
BUN: 8 mg/dL (ref 6–20)
CO2: 24 mmol/L (ref 22–32)
Calcium: 8.3 mg/dL — ABNORMAL LOW (ref 8.9–10.3)
Chloride: 107 mmol/L (ref 98–111)
Creatinine: 0.68 mg/dL (ref 0.44–1.00)
GFR, Estimated: >60 mL/min (ref >60)
Glucose, Bld: 105 mg/dL — ABNORMAL HIGH (ref 70–99)
Potassium: 4 mmol/L (ref 3.5–5.1)
Sodium: 137 mmol/L (ref 135–145)
Total Bilirubin: 0.3 mg/dL (ref 0.3–1.2)
Total Protein: 7.3 g/dL (ref 6.5–8.1)

## 2022-04-18 LAB — MAGNESIUM: Magnesium: 2.4 mg/dL (ref 1.7–2.4)

## 2022-04-18 LAB — PHOSPHORUS: Phosphorus: 2.8 mg/dL (ref 2.5–4.6)

## 2022-04-18 MED ORDER — ONDANSETRON 8 MG PO TBDP: 8.0000 mg | ORAL_TABLET | Freq: Three times a day (TID) | ORAL | 0 refills | Status: DC | PRN

## 2022-04-18 MED ORDER — SODIUM CHLORIDE 0.9% FLUSH
10.0000 mL | Freq: Once | INTRAVENOUS | Status: AC
  Administered 2022-04-18: 10 mL

## 2022-04-18 MED ORDER — SODIUM CHLORIDE 0.9 % IV SOLN
150.0000 mg | Freq: Once | INTRAVENOUS | Status: AC
  Administered 2022-04-18: 150 mg via INTRAVENOUS
  Filled 2022-04-18: qty 150

## 2022-04-18 MED ORDER — DIPHENHYDRAMINE HCL 50 MG/ML IJ SOLN
50.0000 mg | Freq: Once | INTRAMUSCULAR | Status: AC
  Administered 2022-04-18: 50 mg via INTRAVENOUS
  Filled 2022-04-18: qty 1

## 2022-04-18 MED ORDER — SODIUM CHLORIDE 0.9% FLUSH
10.0000 mL | INTRAVENOUS | Status: DC | PRN
  Administered 2022-04-18: 10 mL

## 2022-04-18 MED ORDER — LORAZEPAM 0.5 MG PO TABS: 0.5000 mg | ORAL_TABLET | Freq: Three times a day (TID) | ORAL | 0 refills | Status: DC | PRN

## 2022-04-18 MED ORDER — LIDOCAINE-PRILOCAINE 2.5-2.5 % EX CREA: TOPICAL_CREAM | Freq: Once | CUTANEOUS | 1 refills | Status: AC

## 2022-04-18 MED ORDER — FAMOTIDINE IN NACL 20-0.9 MG/50ML-% IV SOLN
20.0000 mg | Freq: Once | INTRAVENOUS | Status: AC
  Administered 2022-04-18: 20 mg via INTRAVENOUS
  Filled 2022-04-18: qty 50

## 2022-04-18 MED ORDER — HEPARIN SOD (PORK) LOCK FLUSH 100 UNIT/ML IV SOLN
500.0000 [IU] | Freq: Once | INTRAVENOUS | Status: AC | PRN
  Administered 2022-04-18: 500 [IU]

## 2022-04-18 MED ORDER — SODIUM CHLORIDE 0.9 % IV SOLN
10.0000 mg | Freq: Once | INTRAVENOUS | Status: AC
  Administered 2022-04-18: 10 mg via INTRAVENOUS
  Filled 2022-04-18: qty 10

## 2022-04-18 MED ORDER — GABAPENTIN 300 MG PO CAPS: 300.0000 mg | ORAL_CAPSULE | Freq: Three times a day (TID) | ORAL | 3 refills | Status: DC

## 2022-04-18 MED ORDER — SODIUM CHLORIDE 0.9 % IV SOLN: INTRAVENOUS | Status: DC

## 2022-04-18 MED ORDER — ATROPINE SULFATE 1 MG/ML IV SOLN
0.5000 mg | Freq: Once | INTRAVENOUS | Status: AC | PRN
  Administered 2022-04-18: 0.5 mg via INTRAVENOUS
  Filled 2022-04-18: qty 1

## 2022-04-18 MED ORDER — ACETAMINOPHEN 325 MG PO TABS
650.0000 mg | ORAL_TABLET | Freq: Once | ORAL | Status: AC
  Administered 2022-04-18: 650 mg via ORAL
  Filled 2022-04-18: qty 2

## 2022-04-18 MED ORDER — SODIUM CHLORIDE 0.9 % IV SOLN
10.0000 mg/kg | Freq: Once | INTRAVENOUS | Status: AC
  Administered 2022-04-18: 720 mg via INTRAVENOUS
  Filled 2022-04-18: qty 72

## 2022-04-18 MED ORDER — PALONOSETRON HCL INJECTION 0.25 MG/5ML
0.2500 mg | Freq: Once | INTRAVENOUS | Status: AC
  Administered 2022-04-18: 0.25 mg via INTRAVENOUS
  Filled 2022-04-18: qty 5

## 2022-04-18 NOTE — Assessment & Plan Note (Signed)
Ellen [name] is a 27 year old woman with stage IV initially triple positive breast cancer, with repeat biopsy indicating triple negative breast cancer here today for labs and follow-up prior to receiving cycle 3-day 8 of Trodelvy.  Her CBC is stable her c-Met is pending.  She will proceed with the Ellen [name] so long as her c-Met is within parameters.  Since her white blood cells were so good and she has really been struggling with taking shots lately she would like to forego the Udenyca on day 3 which I have canceled.  He is going to meet with Ellen [name] and Ellen [name] after this which will be good for her so she can talk about the way she is feeling with identity loss and repeat visits here to the cancer center.  Ellen [name] is undergoing restaging CT scans on March 13 and we will see her back on March 15 for labs, follow-up, and potentially treatment.

## 2022-04-18 NOTE — Progress Notes (Signed)
Patient declined to stay for 30 minutes following administration of Trodelvy infusion. Vital signs retaken and remained stable. Patient showed no signs of distress upon discharge. Reviewed infection prevention precautions with patient. Patient verbalized an understanding of the information.

## 2022-04-18 NOTE — Patient Instructions (Signed)
[facility]   Discharge Instructions: Thank you for choosing [facility] to provide your oncology and hematology care.   If you have a lab appointment with the [facility], please go directly to the [facility] and check in at the registration area.   Wear comfortable clothing and clothing appropriate for easy access to any Portacath or PICC line.   We strive to give you quality time with your provider. You may need to reschedule your appointment if you arrive late (15 or more minutes).  Arriving late affects you and other patients whose appointments are after yours.  Also, if you miss three or more appointments without notifying the office, you may be dismissed from the clinic at the provider's discretion.      For prescription refill requests, have your pharmacy contact our office and allow 72 hours for refills to be completed.    Today you received the following chemotherapy and/or immunotherapy agents: Sacituzumab govitecan [name])      To help prevent nausea and vomiting after your treatment, we encourage you to take your nausea medication as directed.  BELOW ARE SYMPTOMS THAT SHOULD BE REPORTED IMMEDIATELY: *FEVER GREATER THAN 100.4 F (38 C) OR HIGHER *CHILLS OR SWEATING *NAUSEA AND VOMITING THAT IS NOT CONTROLLED WITH YOUR NAUSEA MEDICATION *UNUSUAL SHORTNESS OF BREATH *UNUSUAL BRUISING OR BLEEDING *URINARY PROBLEMS (pain or burning when urinating, or frequent urination) *BOWEL PROBLEMS (unusual diarrhea, constipation, pain near the anus) TENDERNESS IN MOUTH AND THROAT WITH OR WITHOUT PRESENCE OF ULCERS (sore throat, sores in mouth, or a toothache) UNUSUAL RASH, SWELLING OR PAIN  UNUSUAL VAGINAL DISCHARGE OR ITCHING   Items with * indicate a potential emergency and should be followed up as soon as possible or go to the Emergency Department if any problems should occur.  Please show the CHEMOTHERAPY ALERT CARD or  IMMUNOTHERAPY ALERT CARD at check-in to the Emergency Department and triage nurse.  Should you have questions after your visit or need to cancel or reschedule your appointment, please contact [facility]  Dept: [phone]  and follow the prompts.  Office hours are 8:00 a.m. to 4:30 p.m. Monday - Friday. Please note that voicemails left after 4:00 p.m. may not be returned until the following business day.  We are closed weekends and major holidays. You have access to a nurse at all times for urgent questions. Please call the main number to the clinic Dept: [phone] and follow the prompts.   For any non-urgent questions, you may also contact your provider using MyChart. We now offer e-Visits for anyone 74 and older to request care online for non-urgent symptoms. For details visit [email or web address].   Also download the MyChart app! Go to the app store, search "MyChart", open the app, select [facility], and log in with your MyChart username and password.
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
## 2022-04-18 NOTE — Progress Notes (Signed)
Per Dr. Lorenso Courier, okay to proceed with premeds prior to [facility] results.  RN will update provider with abnormal values.

## 2022-04-25 ENCOUNTER — Inpatient Hospital Stay: Payer: Medicaid Other | Admitting: Hematology and Oncology

## 2022-04-25 ENCOUNTER — Inpatient Hospital Stay: Payer: Medicaid Other

## 2022-04-28 ENCOUNTER — Inpatient Hospital Stay (HOSPITAL_BASED_OUTPATIENT_CLINIC_OR_DEPARTMENT_OTHER): Payer: Medicaid Other | Admitting: Adult Health

## 2022-04-28 ENCOUNTER — Other Ambulatory Visit: Payer: Medicaid Other

## 2022-04-28 ENCOUNTER — Other Ambulatory Visit: Payer: Self-pay

## 2022-04-28 ENCOUNTER — Inpatient Hospital Stay: Payer: Medicaid Other

## 2022-04-28 ENCOUNTER — Telehealth: Payer: Self-pay

## 2022-04-28 ENCOUNTER — Encounter: Payer: Self-pay | Admitting: Adult Health

## 2022-04-28 ENCOUNTER — Ambulatory Visit (HOSPITAL_COMMUNITY)
Admission: RE | Admit: 2022-04-28 | Discharge: 2022-04-28 | Disposition: A | Discharge status: Home / Self Care | Payer: Medicaid Other | Source: Ambulatory Visit | Attending: Adult Health | Admitting: Adult Health

## 2022-04-28 VITALS — BP 134/89 | HR 123 | Temp 97.9°F | Resp 16 | Ht 65.0 in | Wt 145.1 lb

## 2022-04-28 DIAGNOSIS — R052 Subacute cough: Secondary | ICD-10-CM

## 2022-04-28 DIAGNOSIS — Z95828 Presence of other vascular implants and grafts: Secondary | ICD-10-CM | POA: Diagnosis not present

## 2022-04-28 DIAGNOSIS — C7951 Secondary malignant neoplasm of bone: Secondary | ICD-10-CM

## 2022-04-28 DIAGNOSIS — C50919 Malignant neoplasm of unspecified site of unspecified female breast: Secondary | ICD-10-CM

## 2022-04-28 DIAGNOSIS — R0602 Shortness of breath: Secondary | ICD-10-CM

## 2022-04-28 DIAGNOSIS — Z5111 Encounter for antineoplastic chemotherapy: Secondary | ICD-10-CM | POA: Diagnosis not present

## 2022-04-28 DIAGNOSIS — C50912 Malignant neoplasm of unspecified site of left female breast: Secondary | ICD-10-CM

## 2022-04-28 LAB — CBC WITH DIFFERENTIAL (CANCER CENTER ONLY)
Abs Immature Granulocytes: 0.01 10*3/uL (ref 0.00–0.07)
Basophils Absolute: 0 10*3/uL (ref 0.0–0.1)
Basophils Relative: 1 %
Eosinophils Absolute: 0.1 10*3/uL (ref 0.0–0.5)
Eosinophils Relative: 2 %
HCT: 39.4 % (ref 36.0–46.0)
Hemoglobin: 13.4 g/dL (ref 12.0–15.0)
Immature Granulocytes: 0 %
Lymphocytes Relative: 33 %
Lymphs Abs: 1.2 10*3/uL (ref 0.7–4.0)
MCH: 30.4 pg (ref 26.0–34.0)
MCHC: 34 g/dL (ref 30.0–36.0)
MCV: 89.3 fL (ref 80.0–100.0)
Monocytes Absolute: 0.4 10*3/uL (ref 0.1–1.0)
Monocytes Relative: 12 %
Neutro Abs: 1.9 10*3/uL (ref 1.7–7.7)
Neutrophils Relative %: 52 %
Platelet Count: 323 10*3/uL (ref 150–400)
RBC: 4.41 MIL/uL (ref 3.87–5.11)
RDW: 14.4 % (ref 11.5–15.5)
WBC Count: 3.6 10*3/uL — ABNORMAL LOW (ref 4.0–10.5)
nRBC: 0 % (ref 0.0–0.2)

## 2022-04-28 LAB — CMP (CANCER CENTER ONLY)
ALT: 49 U/L — ABNORMAL HIGH (ref 0–44)
AST: 68 U/L — ABNORMAL HIGH (ref 15–41)
Albumin: 4.1 g/dL (ref 3.5–5.0)
Alkaline Phosphatase: 99 U/L (ref 38–126)
Anion gap: 7 (ref 5–15)
BUN: 9 mg/dL (ref 6–20)
CO2: 27 mmol/L (ref 22–32)
Calcium: 9.4 mg/dL (ref 8.9–10.3)
Chloride: 105 mmol/L (ref 98–111)
Creatinine: 0.74 mg/dL (ref 0.44–1.00)
GFR, Estimated: >60 mL/min (ref >60)
Glucose, Bld: 119 mg/dL — ABNORMAL HIGH (ref 70–99)
Potassium: 4.1 mmol/L (ref 3.5–5.1)
Sodium: 139 mmol/L (ref 135–145)
Total Bilirubin: 0.3 mg/dL (ref 0.3–1.2)
Total Protein: 7.2 g/dL (ref 6.5–8.1)

## 2022-04-28 LAB — MAGNESIUM: Magnesium: 2 mg/dL (ref 1.7–2.4)

## 2022-04-28 LAB — PHOSPHORUS: Phosphorus: 4 mg/dL (ref 2.5–4.6)

## 2022-04-28 LAB — BRAIN NATRIURETIC PEPTIDE: B Natriuretic Peptide: 21.3 pg/mL (ref 0.0–100.0)

## 2022-04-28 MED ORDER — ALTEPLASE 2 MG IJ SOLR
2.0000 mg | Freq: Once | INTRAMUSCULAR | Status: AC
  Administered 2022-04-28: 2 mg
  Filled 2022-04-28: qty 2

## 2022-04-28 MED ORDER — HEPARIN SOD (PORK) LOCK FLUSH 100 UNIT/ML IV SOLN: 500.0000 [IU] | Freq: Once | INTRAVENOUS | Status: DC

## 2022-04-28 MED ORDER — FLUTICASONE PROPIONATE HFA 110 MCG/ACT IN AERO: 2.0000 | INHALATION_SPRAY | Freq: Two times a day (BID) | RESPIRATORY_TRACT | 12 refills | Status: DC

## 2022-04-28 MED ORDER — METHYLPREDNISOLONE 4 MG PO TBPK: ORAL_TABLET | ORAL | 0 refills | Status: DC

## 2022-04-28 MED ORDER — ALBUTEROL SULFATE (2.5 MG/3ML) 0.083% IN NEBU: 2.5000 mg | INHALATION_SOLUTION | Freq: Four times a day (QID) | RESPIRATORY_TRACT | 12 refills | Status: DC | PRN

## 2022-04-28 MED ORDER — SODIUM CHLORIDE 0.9% FLUSH: 10.0000 mL | Freq: Once | INTRAVENOUS | Status: DC

## 2022-04-28 MED ORDER — HEPARIN SOD (PORK) LOCK FLUSH 100 UNIT/ML IV SOLN
500.0000 [IU] | Freq: Once | INTRAVENOUS | Status: AC
  Administered 2022-04-28: 500 [IU]

## 2022-04-28 NOTE — Assessment & Plan Note (Signed)
Ellen [name] is a 27 year old woman with stage IV breast cancer here today for follow-up and evaluation of a subacute cough.  This has been worsening over the past week.  She tells me that she has been wheezing more and has not missed out of her inhaler which she had filled on February 2.  I do not hear an acute infection however she does have wheezing throughout her lungs.  I have placed orders for albuterol nebulizer solution, I have increased the dosing of her Flovent inhaler 210 mcg, and I also recommended she take the Z-Pak prescribed along with the steroid taper that I sent in.  She will undergo a stat chest x-ray today to rule out pleural effusion, pneumonia.  We did discuss the possibility of this being lymphangitic tumor spread and we will know more with CT scans that we will obtain tomorrow.  She will return on Friday for labs, follow-up and possibly her next treatment.

## 2022-04-28 NOTE — Telephone Encounter (Signed)
Spoke with patient regarding her shortness of breath and asked if she would like to come in today for evaluation with Wilber Bihari NP. Patient verbalized yes she would like to come in. Appointment made for patient.
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
## 2022-04-29 ENCOUNTER — Ambulatory Visit (HOSPITAL_COMMUNITY)
Admission: RE | Admit: 2022-04-29 | Discharge: 2022-04-29 | Disposition: A | Discharge status: Home / Self Care | Payer: Medicaid Other | Source: Ambulatory Visit | Attending: Hematology and Oncology | Admitting: Hematology and Oncology

## 2022-04-29 DIAGNOSIS — Z5111 Encounter for antineoplastic chemotherapy: Secondary | ICD-10-CM | POA: Diagnosis not present

## 2022-04-29 DIAGNOSIS — C50912 Malignant neoplasm of unspecified site of left female breast: Secondary | ICD-10-CM

## 2022-04-29 DIAGNOSIS — C50919 Malignant neoplasm of unspecified site of unspecified female breast: Secondary | ICD-10-CM

## 2022-04-29 MED ORDER — IOHEXOL 300 MG/ML  SOLN
100.0000 mL | Freq: Once | INTRAMUSCULAR | Status: AC | PRN
  Administered 2022-04-29: 100 mL via INTRAVENOUS

## 2022-04-29 MED ORDER — HEPARIN SOD (PORK) LOCK FLUSH 100 UNIT/ML IV SOLN
500.0000 [IU] | Freq: Once | INTRAVENOUS | Status: AC
  Administered 2022-04-29: 500 [IU] via INTRAVENOUS

## 2022-04-30 ENCOUNTER — Ambulatory Visit (HOSPITAL_COMMUNITY): Payer: Medicaid Other

## 2022-05-01 MED FILL — Fosaprepitant Dimeglumine For IV Infusion 150 MG (Base Eq): INTRAVENOUS | Qty: 5 | Status: AC

## 2022-05-01 MED FILL — Dexamethasone Sodium Phosphate Inj 100 MG/10ML: INTRAMUSCULAR | Qty: 1 | Status: AC

## 2022-05-02 ENCOUNTER — Inpatient Hospital Stay (HOSPITAL_BASED_OUTPATIENT_CLINIC_OR_DEPARTMENT_OTHER): Payer: Medicaid Other | Admitting: Adult Health

## 2022-05-02 ENCOUNTER — Inpatient Hospital Stay: Payer: Medicaid Other

## 2022-05-02 ENCOUNTER — Encounter: Payer: Self-pay | Admitting: Adult Health

## 2022-05-02 DIAGNOSIS — C7951 Secondary malignant neoplasm of bone: Secondary | ICD-10-CM

## 2022-05-02 DIAGNOSIS — C50912 Malignant neoplasm of unspecified site of left female breast: Secondary | ICD-10-CM

## 2022-05-02 DIAGNOSIS — C50919 Malignant neoplasm of unspecified site of unspecified female breast: Secondary | ICD-10-CM | POA: Diagnosis not present

## 2022-05-02 DIAGNOSIS — Z5111 Encounter for antineoplastic chemotherapy: Secondary | ICD-10-CM | POA: Diagnosis not present

## 2022-05-02 DIAGNOSIS — Z95828 Presence of other vascular implants and grafts: Secondary | ICD-10-CM

## 2022-05-02 LAB — CBC WITH DIFFERENTIAL (CANCER CENTER ONLY)
Abs Immature Granulocytes: 0.02 10*3/uL (ref 0.00–0.07)
Basophils Absolute: 0 10*3/uL (ref 0.0–0.1)
Basophils Relative: 0 %
Eosinophils Absolute: 0 10*3/uL (ref 0.0–0.5)
Eosinophils Relative: 0 %
HCT: 36.7 % (ref 36.0–46.0)
Hemoglobin: 11.9 g/dL — ABNORMAL LOW (ref 12.0–15.0)
Immature Granulocytes: 0 %
Lymphocytes Relative: 19 %
Lymphs Abs: 1.7 10*3/uL (ref 0.7–4.0)
MCH: 29 pg (ref 26.0–34.0)
MCHC: 32.4 g/dL (ref 30.0–36.0)
MCV: 89.5 fL (ref 80.0–100.0)
Monocytes Absolute: 1.2 10*3/uL — ABNORMAL HIGH (ref 0.1–1.0)
Monocytes Relative: 13 %
Neutro Abs: 5.9 10*3/uL (ref 1.7–7.7)
Neutrophils Relative %: 68 %
Platelet Count: 340 10*3/uL (ref 150–400)
RBC: 4.1 MIL/uL (ref 3.87–5.11)
RDW: 14.8 % (ref 11.5–15.5)
WBC Count: 8.8 10*3/uL (ref 4.0–10.5)
nRBC: 0 % (ref 0.0–0.2)

## 2022-05-02 LAB — CMP (CANCER CENTER ONLY)
ALT: 56 U/L — ABNORMAL HIGH (ref 0–44)
AST: 86 U/L — ABNORMAL HIGH (ref 15–41)
Albumin: 4 g/dL (ref 3.5–5.0)
Alkaline Phosphatase: 93 U/L (ref 38–126)
Anion gap: 7 (ref 5–15)
BUN: 13 mg/dL (ref 6–20)
CO2: 27 mmol/L (ref 22–32)
Calcium: 9.4 mg/dL (ref 8.9–10.3)
Chloride: 107 mmol/L (ref 98–111)
Creatinine: 0.6 mg/dL (ref 0.44–1.00)
GFR, Estimated: >60 mL/min (ref >60)
Glucose, Bld: 125 mg/dL — ABNORMAL HIGH (ref 70–99)
Potassium: 3.6 mmol/L (ref 3.5–5.1)
Sodium: 141 mmol/L (ref 135–145)
Total Bilirubin: 0.3 mg/dL (ref 0.3–1.2)
Total Protein: 6.9 g/dL (ref 6.5–8.1)

## 2022-05-02 LAB — PHOSPHORUS: Phosphorus: 3.4 mg/dL (ref 2.5–4.6)

## 2022-05-02 LAB — MAGNESIUM: Magnesium: 1.9 mg/dL (ref 1.7–2.4)

## 2022-05-02 MED ORDER — SODIUM CHLORIDE 0.9% FLUSH
10.0000 mL | Freq: Once | INTRAVENOUS | Status: AC
  Administered 2022-05-02: 10 mL

## 2022-05-02 MED ORDER — HEPARIN SOD (PORK) LOCK FLUSH 100 UNIT/ML IV SOLN
500.0000 [IU] | Freq: Once | INTRAVENOUS | Status: AC
  Administered 2022-05-02: 500 [IU]

## 2022-05-02 MED ORDER — LORAZEPAM 1 MG PO TABS: 1.0000 mg | ORAL_TABLET | Freq: Every evening | ORAL | 1 refills | Status: DC | PRN
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
## 2022-05-02 NOTE — Assessment & Plan Note (Signed)
Ellen [name] is a 27 year old woman with stage IV metastatic breast cancer that has progressed on several previous treatments.  I met with her today and she will not proceed with Trodelvy.  We discussed and reviewed the images in epic.  Her breast mass is enlarging and we discussed potentially referring her to radiation oncology to discuss radiation treatment to this area.  She is going to think about this.  I pulled her Caris testing and she is going to follow-up with Dr. Wetzel Bjornstad on Monday at 9:30 AM to discuss treatment options.  She will call me as soon as she is out of that appointment so we can discuss next steps.  I prescribed lorazepam 1 mg for her to take as needed at bedtime.  She will continue the nebulizer as needed and the steroid inhaler twice a day.

## 2022-05-03 ENCOUNTER — Inpatient Hospital Stay: Payer: Medicaid Other

## 2022-05-05 ENCOUNTER — Other Ambulatory Visit: Payer: Self-pay | Admitting: Adult Health

## 2022-05-05 ENCOUNTER — Encounter: Payer: Self-pay | Admitting: General Practice

## 2022-05-05 DIAGNOSIS — C50919 Malignant neoplasm of unspecified site of unspecified female breast: Secondary | ICD-10-CM

## 2022-05-05 NOTE — Progress Notes (Signed)
Patient underwent consultation with Dr. Wetzel Bjornstad who recommended liver biopsy and thoracentesis with pathology/cytology of both specimens.  Order placed for this to occur in interventional radiology.    Patient verbalizes understanding and knows they will call her with an appointment.   Wilber Bihari, NP 05/05/22 1:33 PM Medical Oncology and Hematology [facility] [place], [place] [address] Tel. [phone]    Fax. [phone]

## 2022-05-05 NOTE — Progress Notes (Signed)
Arne Cleveland, MD  Allen Kell, NT PROCEDURE / BIOPSY REVIEW Date: 05/05/22  Requested Biopsy site: Liver Reason for request: met Imaging review: Best seen on CT 04/29/22 Im 47 Se 4  Decision: Approved Imaging modality to perform: Ultrasound Schedule with: Moderate Sedation Schedule for: Any VIR  Additional comments:   Please contact me with questions, concerns, or if issue pertaining to this request arise.  Dayne Arne Cleveland, MD [facility]       Previous Messages    ----- Message ----- From: Allen Kell, NT Sent: 05/05/2022   2:04 PM EDT To: Ir Procedure Requests Subject: US Biopsy Liver                                Procedure: US Biopsy Liver  Reason: new liver nodules, metastatic breast cancer, evaluate Dx: Primary malignant neoplasm of breast with metastasis  History: Ct in chart  Provider: Gardenia Phlegm, NP  Contact: [phone]

## 2022-05-06 ENCOUNTER — Encounter: Payer: Self-pay | Admitting: Adult Health

## 2022-05-07 ENCOUNTER — Other Ambulatory Visit: Payer: Self-pay

## 2022-05-07 ENCOUNTER — Emergency Department (HOSPITAL_BASED_OUTPATIENT_CLINIC_OR_DEPARTMENT_OTHER): Payer: Medicaid Other

## 2022-05-07 ENCOUNTER — Encounter (HOSPITAL_BASED_OUTPATIENT_CLINIC_OR_DEPARTMENT_OTHER): Payer: Self-pay

## 2022-05-07 ENCOUNTER — Inpatient Hospital Stay (HOSPITAL_BASED_OUTPATIENT_CLINIC_OR_DEPARTMENT_OTHER)
Admission: EM | Admit: 2022-05-07 | Discharge: 2022-05-09 | DRG: 597 | Disposition: A | Discharge status: Home / Self Care | Payer: Medicaid Other | Attending: Internal Medicine | Admitting: Internal Medicine

## 2022-05-07 DIAGNOSIS — C787 Secondary malignant neoplasm of liver and intrahepatic bile duct: Secondary | ICD-10-CM | POA: Diagnosis present

## 2022-05-07 DIAGNOSIS — J910 Malignant pleural effusion: Secondary | ICD-10-CM | POA: Diagnosis present

## 2022-05-07 DIAGNOSIS — R000 Tachycardia, unspecified: Secondary | ICD-10-CM | POA: Diagnosis present

## 2022-05-07 DIAGNOSIS — Z1152 Encounter for screening for COVID-19: Secondary | ICD-10-CM

## 2022-05-07 DIAGNOSIS — Z9221 Personal history of antineoplastic chemotherapy: Secondary | ICD-10-CM | POA: Diagnosis not present

## 2022-05-07 DIAGNOSIS — G893 Neoplasm related pain (acute) (chronic): Secondary | ICD-10-CM | POA: Diagnosis present

## 2022-05-07 DIAGNOSIS — R112 Nausea with vomiting, unspecified: Secondary | ICD-10-CM | POA: Diagnosis present

## 2022-05-07 DIAGNOSIS — Z853 Personal history of malignant neoplasm of breast: Secondary | ICD-10-CM | POA: Diagnosis not present

## 2022-05-07 DIAGNOSIS — R1013 Epigastric pain: Secondary | ICD-10-CM

## 2022-05-07 DIAGNOSIS — Z800 Family history of malignant neoplasm of digestive organs: Secondary | ICD-10-CM

## 2022-05-07 DIAGNOSIS — Z79899 Other long term (current) drug therapy: Secondary | ICD-10-CM

## 2022-05-07 DIAGNOSIS — J9601 Acute respiratory failure with hypoxia: Secondary | ICD-10-CM | POA: Diagnosis present

## 2022-05-07 DIAGNOSIS — R140 Abdominal distension (gaseous): Secondary | ICD-10-CM | POA: Diagnosis present

## 2022-05-07 DIAGNOSIS — J90 Pleural effusion, not elsewhere classified: Secondary | ICD-10-CM

## 2022-05-07 DIAGNOSIS — Z7951 Long term (current) use of inhaled steroids: Secondary | ICD-10-CM | POA: Diagnosis not present

## 2022-05-07 DIAGNOSIS — C779 Secondary and unspecified malignant neoplasm of lymph node, unspecified: Secondary | ICD-10-CM | POA: Diagnosis present

## 2022-05-07 DIAGNOSIS — Z83438 Family history of other disorder of lipoprotein metabolism and other lipidemia: Secondary | ICD-10-CM

## 2022-05-07 DIAGNOSIS — R748 Abnormal levels of other serum enzymes: Secondary | ICD-10-CM

## 2022-05-07 DIAGNOSIS — F32A Depression, unspecified: Secondary | ICD-10-CM | POA: Diagnosis present

## 2022-05-07 DIAGNOSIS — K219 Gastro-esophageal reflux disease without esophagitis: Secondary | ICD-10-CM | POA: Diagnosis present

## 2022-05-07 DIAGNOSIS — F909 Attention-deficit hyperactivity disorder, unspecified type: Secondary | ICD-10-CM | POA: Diagnosis present

## 2022-05-07 DIAGNOSIS — Z7189 Other specified counseling: Secondary | ICD-10-CM | POA: Diagnosis not present

## 2022-05-07 DIAGNOSIS — Z808 Family history of malignant neoplasm of other organs or systems: Secondary | ICD-10-CM

## 2022-05-07 DIAGNOSIS — Z818 Family history of other mental and behavioral disorders: Secondary | ICD-10-CM | POA: Diagnosis not present

## 2022-05-07 DIAGNOSIS — C50919 Malignant neoplasm of unspecified site of unspecified female breast: Secondary | ICD-10-CM | POA: Diagnosis not present

## 2022-05-07 DIAGNOSIS — J45909 Unspecified asthma, uncomplicated: Secondary | ICD-10-CM | POA: Diagnosis present

## 2022-05-07 DIAGNOSIS — Z8249 Family history of ischemic heart disease and other diseases of the circulatory system: Secondary | ICD-10-CM | POA: Diagnosis not present

## 2022-05-07 DIAGNOSIS — F419 Anxiety disorder, unspecified: Secondary | ICD-10-CM | POA: Diagnosis present

## 2022-05-07 DIAGNOSIS — R7989 Other specified abnormal findings of blood chemistry: Secondary | ICD-10-CM | POA: Diagnosis present

## 2022-05-07 DIAGNOSIS — C50912 Malignant neoplasm of unspecified site of left female breast: Principal | ICD-10-CM | POA: Diagnosis present

## 2022-05-07 DIAGNOSIS — F17290 Nicotine dependence, other tobacco product, uncomplicated: Secondary | ICD-10-CM | POA: Diagnosis present

## 2022-05-07 DIAGNOSIS — Z515 Encounter for palliative care: Secondary | ICD-10-CM | POA: Diagnosis not present

## 2022-05-07 DIAGNOSIS — C7951 Secondary malignant neoplasm of bone: Secondary | ICD-10-CM | POA: Diagnosis present

## 2022-05-07 DIAGNOSIS — Z881 Allergy status to other antibiotic agents status: Secondary | ICD-10-CM

## 2022-05-07 DIAGNOSIS — Z5986 Financial insecurity: Secondary | ICD-10-CM

## 2022-05-07 DIAGNOSIS — R109 Unspecified abdominal pain: Secondary | ICD-10-CM

## 2022-05-07 DIAGNOSIS — R0602 Shortness of breath: Secondary | ICD-10-CM

## 2022-05-07 DIAGNOSIS — Z833 Family history of diabetes mellitus: Secondary | ICD-10-CM

## 2022-05-07 LAB — COMPREHENSIVE METABOLIC PANEL
ALT: 98 U/L — ABNORMAL HIGH (ref 0–44)
AST: 133 U/L — ABNORMAL HIGH (ref 15–41)
Albumin: 3.7 g/dL (ref 3.5–5.0)
Alkaline Phosphatase: 162 U/L — ABNORMAL HIGH (ref 38–126)
Anion gap: 10 (ref 5–15)
BUN: 6 mg/dL (ref 6–20)
CO2: 23 mmol/L (ref 22–32)
Calcium: 9.2 mg/dL (ref 8.9–10.3)
Chloride: 105 mmol/L (ref 98–111)
Creatinine, Ser: 0.65 mg/dL (ref 0.44–1.00)
GFR, Estimated: >60 mL/min (ref >60)
Glucose, Bld: 86 mg/dL (ref 70–99)
Potassium: 4.2 mmol/L (ref 3.5–5.1)
Sodium: 138 mmol/L (ref 135–145)
Total Bilirubin: 0.4 mg/dL (ref 0.3–1.2)
Total Protein: 6.6 g/dL (ref 6.5–8.1)

## 2022-05-07 LAB — CBC
HCT: 41.6 % (ref 36.0–46.0)
Hemoglobin: 13.4 g/dL (ref 12.0–15.0)
MCH: 28.8 pg (ref 26.0–34.0)
MCHC: 32.2 g/dL (ref 30.0–36.0)
MCV: 89.3 fL (ref 80.0–100.0)
Platelets: 320 10*3/uL (ref 150–400)
RBC: 4.66 MIL/uL (ref 3.87–5.11)
RDW: 15.3 % (ref 11.5–15.5)
WBC: 11.4 10*3/uL — ABNORMAL HIGH (ref 4.0–10.5)
nRBC: 0 % (ref 0.0–0.2)

## 2022-05-07 LAB — LIPASE, BLOOD: Lipase: <10 U/L — ABNORMAL LOW (ref 11–51)

## 2022-05-07 LAB — RESP PANEL BY RT-PCR (RSV, FLU A&B, COVID)  RVPGX2
Influenza A by PCR: NEGATIVE
Influenza B by PCR: NEGATIVE
Resp Syncytial Virus by PCR: NEGATIVE
SARS Coronavirus 2 by RT PCR: NEGATIVE

## 2022-05-07 MED ORDER — LORAZEPAM 1 MG PO TABS: 1.0000 mg | ORAL_TABLET | Freq: Every evening | ORAL | Status: DC | PRN

## 2022-05-07 MED ORDER — HYDROMORPHONE HCL 1 MG/ML IJ SOLN
1.0000 mg | INTRAMUSCULAR | Status: DC | PRN
  Administered 2022-05-08 (×4): 1 mg via INTRAVENOUS
  Filled 2022-05-07 (×4): qty 1

## 2022-05-07 MED ORDER — LORAZEPAM 2 MG/ML IJ SOLN
1.0000 mg | Freq: Once | INTRAMUSCULAR | Status: AC | PRN
  Administered 2022-05-08: 1 mg via INTRAVENOUS
  Filled 2022-05-07: qty 1

## 2022-05-07 MED ORDER — SODIUM CHLORIDE 0.9 % IV SOLN
12.5000 mg | Freq: Four times a day (QID) | INTRAVENOUS | Status: DC | PRN
  Administered 2022-05-07: 12.5 mg via INTRAVENOUS
  Filled 2022-05-07: qty 12.5

## 2022-05-07 MED ORDER — PANTOPRAZOLE SODIUM 40 MG IV SOLR
40.0000 mg | Freq: Once | INTRAVENOUS | Status: AC
  Administered 2022-05-07: 40 mg via INTRAVENOUS
  Filled 2022-05-07: qty 10

## 2022-05-07 MED ORDER — LIDOCAINE-PRILOCAINE 2.5-2.5 % EX CREA
TOPICAL_CREAM | Freq: Once | CUTANEOUS | Status: DC
  Filled 2022-05-07: qty 30

## 2022-05-07 MED ORDER — HYDROMORPHONE HCL 1 MG/ML IJ SOLN
0.5000 mg | Freq: Once | INTRAMUSCULAR | Status: AC
  Administered 2022-05-07: 0.5 mg via INTRAVENOUS
  Filled 2022-05-07: qty 1

## 2022-05-07 MED ORDER — ALBUTEROL SULFATE (2.5 MG/3ML) 0.083% IN NEBU: 2.5000 mg | INHALATION_SOLUTION | Freq: Four times a day (QID) | RESPIRATORY_TRACT | Status: DC | PRN

## 2022-05-07 MED ORDER — VENLAFAXINE HCL ER 75 MG PO CP24
225.0000 mg | ORAL_CAPSULE | Freq: Every day | ORAL | Status: DC
  Administered 2022-05-09: 225 mg via ORAL
  Filled 2022-05-07 (×2): qty 1

## 2022-05-07 MED ORDER — ALBUTEROL SULFATE (2.5 MG/3ML) 0.083% IN NEBU
2.5000 mg | INHALATION_SOLUTION | Freq: Three times a day (TID) | RESPIRATORY_TRACT | Status: DC
  Administered 2022-05-08 – 2022-05-09 (×5): 2.5 mg via RESPIRATORY_TRACT
  Filled 2022-05-07 (×6): qty 3

## 2022-05-07 MED ORDER — GABAPENTIN 300 MG PO CAPS
300.0000 mg | ORAL_CAPSULE | Freq: Three times a day (TID) | ORAL | Status: DC
  Administered 2022-05-07 – 2022-05-09 (×5): 300 mg via ORAL
  Filled 2022-05-07 (×5): qty 1

## 2022-05-07 MED ORDER — SODIUM CHLORIDE 0.9 % IV SOLN: INTRAVENOUS | Status: AC

## 2022-05-07 MED ORDER — MORPHINE SULFATE (PF) 2 MG/ML IV SOLN: 2.0000 mg | INTRAVENOUS | Status: DC | PRN

## 2022-05-07 MED ORDER — BUDESONIDE 0.25 MG/2ML IN SUSP
0.2500 mg | Freq: Two times a day (BID) | RESPIRATORY_TRACT | Status: DC
  Administered 2022-05-07 – 2022-05-09 (×4): 0.25 mg via RESPIRATORY_TRACT
  Filled 2022-05-07 (×4): qty 2

## 2022-05-07 MED ORDER — PANTOPRAZOLE SODIUM 40 MG PO TBEC
40.0000 mg | DELAYED_RELEASE_TABLET | Freq: Every day | ORAL | Status: DC
  Administered 2022-05-08 – 2022-05-09 (×2): 40 mg via ORAL
  Filled 2022-05-07 (×2): qty 1

## 2022-05-07 MED ORDER — ALBUTEROL SULFATE (2.5 MG/3ML) 0.083% IN NEBU
5.0000 mg | INHALATION_SOLUTION | Freq: Once | RESPIRATORY_TRACT | Status: AC
  Administered 2022-05-07: 5 mg via RESPIRATORY_TRACT
  Filled 2022-05-07: qty 6

## 2022-05-07 MED ORDER — ONDANSETRON HCL 4 MG/2ML IJ SOLN
4.0000 mg | Freq: Four times a day (QID) | INTRAMUSCULAR | Status: DC | PRN
  Administered 2022-05-08 (×2): 4 mg via INTRAVENOUS
  Filled 2022-05-07 (×2): qty 2

## 2022-05-07 MED ORDER — ONDANSETRON HCL 4 MG/2ML IJ SOLN
4.0000 mg | Freq: Once | INTRAMUSCULAR | Status: AC
  Administered 2022-05-07: 4 mg via INTRAVENOUS
  Filled 2022-05-07: qty 2

## 2022-05-07 MED ORDER — LACTATED RINGERS IV BOLUS
500.0000 mL | Freq: Once | INTRAVENOUS | Status: AC
  Administered 2022-05-07: 500 mL via INTRAVENOUS

## 2022-05-07 NOTE — ED Notes (Signed)
Report given to the floor. 

## 2022-05-07 NOTE — Assessment & Plan Note (Signed)
-  continue Effexor and PRN ativan

## 2022-05-07 NOTE — ED Notes (Signed)
Patient takes nebulizer treatments at home every 4 hours as needed. She hasn't had a treatment since 1030 this morning. Patient called for a treatment , feeling SOB. Saturation on 2 L [place] is 92%

## 2022-05-07 NOTE — ED Notes (Signed)
Called [facility] -- informed that the patient Bed Assignment is ready 

## 2022-05-07 NOTE — Assessment & Plan Note (Addendum)
New liver lesions Moderate left pleural effusion -Recent CT C/A/P shows progression of her metastatic disease with likely lymphangitic spread. There is new hypodense metastatic liver lesions. Has outpatient plan on 4/1 to obtain liver biopsy and thoracentesis with pathology/cytology of both specimens. Will need to consult oncology in the morning to assess whether they want this performed while she is inpatient -Have ordered thoracentesis for left pleural effusion at this time  -pt previously on chemotherapy about 3 weeks ago but has plans to trial radiation. She has met with palliative care outpatient once and has started her advanced directives with her mother being the ultimate decision maker. Palliative consult placed for them to continue discussion with her tomorrow. Pt remains in good spirit despite her poor prognosis.  -follows with Dr. Lindi Adie oncology

## 2022-05-07 NOTE — Assessment & Plan Note (Signed)
-  secondary to worsening malignancy and growing pleural effusion -lipase within normal limits and abnormal LFTs likely from metastatic disease -PRN antiemetics -PRN IV opioid for pain

## 2022-05-07 NOTE — ED Notes (Signed)
Report given to [facility]. 

## 2022-05-07 NOTE — ED Notes (Signed)
Patient placed on 2L [place] due to a saturation decrease to 87% RA. She appears to be tolerating well at this time. Humidity was added due to nasal dryness.

## 2022-05-07 NOTE — ED Provider Notes (Signed)
[facility] Provider Note   CSN: [id number] Arrival date & time: 05/07/22  1414     History  Chief Complaint  Patient presents with   Abdominal Pain    Ellen [name] is a 27 y.o. female.  Pt with hx stage IV breast cancer, c/o sob, and epigastric pain. Symptoms acute onset/worsening in past 3-4 days. Was recently told breast cancer has continued to spread and progress in chest, liver/abd and bones, and told has left pleural effusion - no prior drainage of same. No exertional chest pain. No pleuritic chest pain. Occasional non prod cough. No fever or chills. No recent chemo tx. No new leg pain or swelling. No abd distension. No vomiting. Having normal bms.   The history is provided by the patient, a relative, a friend and medical records.  Abdominal Pain Associated symptoms: shortness of breath   Associated symptoms: no chest pain, no chills, no dysuria, no fever, no sore throat and no vomiting        Home Medications Prior to Admission medications   Medication Sig Start Date End Date Taking? Authorizing Provider  acyclovir (ZOVIRAX) 800 MG tablet Take 1 tablet (800 mg total) by mouth 2 (two) times daily as needed (flare up). 10/03/20   Gardenia Phlegm, NP  acyclovir ointment (ZOVIRAX) 5 % Apply 1 application  topically 4 (four) times daily as needed (outbreak). 08/02/20   [provider]  albuterol (PROVENTIL) (2.5 MG/3ML) 0.083% nebulizer solution Take 3 mLs (2.5 mg total) by nebulization every 6 (six) hours as needed for wheezing or shortness of breath. 04/28/22   Gardenia Phlegm, NP  albuterol (VENTOLIN HFA) 108 (90 Base) MCG/ACT inhaler 2 puffs inhaled every 4-6 hours PRN 03/21/22   Gardenia Phlegm, NP  azithromycin (ZITHROMAX Z-PAK) 250 MG tablet Take 2 tab on day 1 then 1 tab daily until complete 04/08/22   Gardenia Phlegm, NP  Calcium Carb-Cholecalciferol 500-10 MG-MCG TABS Take by mouth.     [provider]  diphenoxylate-atropine (LOMOTIL) 2.5-0.025 MG tablet Take 1 tablet by mouth 4 (four) times daily as needed for diarrhea or loose stools. 02/26/22   Gardenia Phlegm, NP  fluticasone (FLOVENT HFA) 110 MCG/ACT inhaler Inhale 2 puffs into the lungs in the morning and at bedtime. 04/28/22   Gardenia Phlegm, NP  gabapentin (NEURONTIN) 300 MG capsule Take 1 capsule (300 mg total) by mouth 3 (three) times daily. 04/18/22   [name], [name], NP  LORazepam (ATIVAN) 1 MG tablet Take 1 tablet (1 mg total) by mouth at bedtime as needed for anxiety. 05/02/22   Gardenia Phlegm, NP  methylPREDNISolone (MEDROL DOSEPAK) 4 MG TBPK tablet Taper 6,5,4,3,2,1 04/28/22   [name], [name], NP  ondansetron (ZOFRAN-ODT) 8 MG disintegrating tablet Dissolve 1 tablet (8 mg total) in mouth every 8 (eight) hours as needed for nausea or vomiting. 04/18/22   Gardenia Phlegm, NP  pantoprazole (PROTONIX) 40 MG tablet Take 1 tablet (40 mg total) by mouth daily. 10/30/21   Gardenia Phlegm, NP  prochlorperazine (COMPAZINE) 10 MG tablet Take 1 tablet (10 mg total) by mouth every 6 (six) hours as needed for nausea or vomiting. 09/20/21   Gardenia Phlegm, NP  venlafaxine XR (EFFEXOR-XR) 150 MG 24 hr capsule Take 1 capsule (150 mg total) by mouth daily with breakfast. Patient taking differently: Take 225 mg by mouth daily with breakfast. 01/07/21   [name], MD  Allergies    Other    Review of Systems   Review of Systems  Constitutional:  Negative for chills and fever.  HENT:  Negative for sore throat.   Eyes:  Negative for redness.  Respiratory:  Positive for shortness of breath.   Cardiovascular:  Negative for chest pain and leg swelling.  Gastrointestinal:  Positive for abdominal pain. Negative for vomiting.  Genitourinary:  Negative for dysuria and flank pain.  Musculoskeletal:  Positive for back pain. Negative for neck pain.  Skin:   Negative for rash.  Neurological:  Negative for weakness, numbness and headaches.  Hematological:  Does not bruise/bleed easily.  Psychiatric/Behavioral:  Negative for confusion.     Physical Exam Updated Vital Signs BP 139/84   Pulse (!) 111   Temp 97.9 F (36.6 C) (Oral)   Resp (!) 22   Ht 1.651 m (5\' 5" )   Wt 64.3 kg   SpO2 96%   BMI 23.59 kg/m  Physical Exam Vitals and nursing note reviewed.  Constitutional:      Appearance: Normal appearance. She is well-developed.  HENT:     Head: Atraumatic.     Nose: Nose normal.     Mouth/Throat:     Mouth: Mucous membranes are moist.  Eyes:     General: No scleral icterus.    Conjunctiva/sclera: Conjunctivae normal.     Pupils: Pupils are equal, round, and reactive to light.  Neck:     Trachea: No tracheal deviation.     Comments: No stiffness or rigidity.  Cardiovascular:     Rate and Rhythm: Normal rate and regular rhythm.     Pulses: Normal pulses.     Heart sounds: Normal heart sounds. No murmur heard.    No friction rub. No gallop.  Pulmonary:     Effort: Pulmonary effort is normal. No respiratory distress.     Breath sounds: Normal breath sounds.     Comments: Port right chest without sign of infection Chest:     Chest wall: No tenderness.  Abdominal:     General: Bowel sounds are normal. There is no distension.     Palpations: Abdomen is soft.     Tenderness: There is no abdominal tenderness. There is no guarding.  Genitourinary:    Comments: No cva tenderness.  Musculoskeletal:        General: No swelling or tenderness.     Cervical back: Normal range of motion and neck supple. No rigidity. No muscular tenderness.  Skin:    General: Skin is warm and dry.     Findings: No rash.  Neurological:     Mental Status: She is alert.     Comments: Alert, speech normal.   Psychiatric:        Mood and Affect: Mood normal.     ED Results / Procedures / Treatments   Labs (all labs ordered are listed, but only  abnormal results are displayed) Results for orders placed or performed during the hospital encounter of 05/07/22  Resp panel by RT-PCR (RSV, Flu A&B, Covid) Anterior Nasal Swab   Specimen: Anterior Nasal Swab  Result Value Ref Range   SARS Coronavirus 2 by RT PCR NEGATIVE NEGATIVE   Influenza A by PCR NEGATIVE NEGATIVE   Influenza B by PCR NEGATIVE NEGATIVE   Resp Syncytial Virus by PCR NEGATIVE NEGATIVE  Comprehensive metabolic panel  Result Value Ref Range   Sodium 138 135 - 145 mmol/L   Potassium 4.2 3.5 - 5.1 mmol/L  Chloride 105 98 - 111 mmol/L   CO2 23 22 - 32 mmol/L   Glucose, Bld 86 70 - 99 mg/dL   BUN 6 6 - 20 mg/dL   Creatinine, Ser 0.65 0.44 - 1.00 mg/dL   Calcium 9.2 8.9 - 10.3 mg/dL   Total Protein 6.6 6.5 - 8.1 g/dL   Albumin 3.7 3.5 - 5.0 g/dL   AST 133 (H) 15 - 41 U/L   ALT 98 (H) 0 - 44 U/L   Alkaline Phosphatase 162 (H) 38 - 126 U/L   Total Bilirubin 0.4 0.3 - 1.2 mg/dL   GFR, Estimated >60 >60 mL/min   Anion gap 10 5 - 15  CBC  Result Value Ref Range   WBC 11.4 (H) 4.0 - 10.5 K/uL   RBC 4.66 3.87 - 5.11 MIL/uL   Hemoglobin 13.4 12.0 - 15.0 g/dL   HCT 41.6 36.0 - 46.0 %   MCV 89.3 80.0 - 100.0 fL   MCH 28.8 26.0 - 34.0 pg   MCHC 32.2 30.0 - 36.0 g/dL   RDW 15.3 11.5 - 15.5 %   Platelets 320 150 - 400 K/uL   nRBC 0.0 0.0 - 0.2 %  Lipase, blood  Result Value Ref Range   Lipase <10 (L) 11 - 51 U/L   DG Chest Port 1 View  Result Date: 05/07/2022 CLINICAL DATA:  Shortness of breath EXAM: PORTABLE CHEST 1 VIEW COMPARISON:  04/28/2022, CT 04/29/2022 FINDINGS: Right-sided central venous port tip at the right atrium. Small moderate left effusion increased compared to prior radiograph from 03/11. Diffuse interstitial somewhat nodular process. Consolidation left lung base. Stable cardiomediastinal silhouette. No pneumothorax IMPRESSION: Small to moderate left pleural effusion, increased compared to prior radiograph. Diffuse interstitial somewhat nodular process  suspected to be secondary to diffuse lymphangitic spread of tumor on recent CT, though there may be a superimposed component of edema or infection. There is interval consolidation of left base. Electronically Signed   By: Donavan Foil M.D.   On: 05/07/2022 16:07   CT CHEST ABDOMEN PELVIS W CONTRAST  Result Date: 05/01/2022 CLINICAL DATA:  Metastatic breast cancer restaging * Tracking Code: BO * EXAM: CT CHEST, ABDOMEN, AND PELVIS WITH CONTRAST TECHNIQUE: Multidetector CT imaging of the chest, abdomen and pelvis was performed following the standard protocol during bolus administration of intravenous contrast. RADIATION DOSE REDUCTION: This exam was performed according to the departmental dose-optimization program which includes automated exposure control, adjustment of the mA and/or kV according to patient size and/or use of iterative reconstruction technique. CONTRAST:  147mL OMNIPAQUE IOHEXOL 300 MG/ML  SOLN COMPARISON:  01/27/2022 FINDINGS: CT CHEST FINDINGS Cardiovascular: Right chest port catheter. Normal heart size. No pericardial effusion. Mediastinum/Nodes: Interval enlargement bilateral axillary and sub pectoral lymph nodes and soft tissue nodules, largest left axillary node measuring 1.8 x 1.1 cm, previously 1.1 x 0.9 cm (series 2, image 16). Numerous prominent and enlarged mediastinal and bilateral hilar lymph nodes, some of which are also enlarged, for example a paraesophageal node measuring 1.3 x 0.9 cm, previously subcentimeter (series 2, image 38). Thyroid gland, trachea, and esophagus demonstrate no significant findings. Lungs/Pleura: New, moderate left pleural effusion with extensive pleural thickening and nodularity, pleural nodule of the medial left lung base measuring 1.4 x 0.8 cm (series 2, image 54). Very significant increase in diffuse bilateral interlobular septal thickening and lymphangitic nodularity with diffusely scattered ground-glass airspace opacity throughout the lungs. Diffuse  bilateral bronchial wall thickening. Musculoskeletal: Redemonstrated large mass of the left breast with  overlying skin thickening (series 2, image 30). No acute osseous findings. CT ABDOMEN PELVIS FINDINGS Hepatobiliary: Numerous new, generally subtle and ill-defined hypodense liver lesions, for example in the anterior right lobe of the liver measuring 1.6 x 1.5 cm (series 2, image 53). No gallstones, gallbladder wall thickening, or biliary dilatation. Pancreas: Unremarkable. No pancreatic ductal dilatation or surrounding inflammatory changes. Spleen: Normal in size without significant abnormality. Adrenals/Urinary Tract: Adrenal glands are unremarkable. Kidneys are normal, without renal calculi, solid lesion, or hydronephrosis. Bladder is unremarkable. Stomach/Bowel: Stomach is within normal limits. Appendix appears normal. No evidence of bowel wall thickening, distention, or inflammatory changes. Vascular/Lymphatic: No significant vascular findings are present. No enlarged abdominal or pelvic lymph nodes. Reproductive: No mass or other abnormality. Other: No abdominal wall hernia or abnormality. No ascites. Musculoskeletal: No acute osseous findings. No significant change in widespread sclerotic osseous metastatic disease throughout all included portions of the axial skeleton, including a pathologic wedge deformity of L3 and a pathologic inferior endplate deformity of L5 (series 5, image 86). IMPRESSION: 1. Interval enlargement of bilateral axillary and sub pectoral lymph nodes and soft tissue nodules. 2. New, moderate left pleural effusion with extensive pleural thickening and nodularity. 3. Very significant increase in diffuse bilateral interlobular septal thickening and lymphangitic nodularity throughout the lungs with diffusely scattered ground-glass airspace opacity. 4. Numerous new, hypodense metastatic liver lesions. 5. No significant change in widespread sclerotic osseous metastatic disease throughout all  included portions of the axial skeleton, including a pathologic wedge deformity of L3 and a pathologic inferior endplate deformity of L5. 6. Constellation of findings is consistent with significant interval worsening of widespread metastatic disease. Electronically Signed   By: Delanna Ahmadi M.D.   On: 05/01/2022 09:26   DG Chest 2 View  Result Date: 04/28/2022 CLINICAL DATA:  Cough and wheezing, metastatic breast cancer EXAM: CHEST - 2 VIEW COMPARISON:  03/21/2022 FINDINGS: Right IJ power port catheter tip SVC RA junction as before. Normal heart size and central vascularity. Similar pattern of diffuse reticulonodular interstitial opacities throughout both lungs. This remains nonspecific for diffuse pulmonary lymphangitic spread of tumor versus infectious/inflammatory process, or pulmonary edema. No large effusion or pneumothorax. Trachea midline. No acute osseous finding. IMPRESSION: Similar pattern of diffuse reticulonodular interstitial opacities throughout both lungs as above. No interval change. Electronically Signed   By: Jerilynn Mages.  Shick M.D.   On: 04/28/2022 12:14    EKG None  Radiology DG Chest Port 1 View  Result Date: 05/07/2022 CLINICAL DATA:  Shortness of breath EXAM: PORTABLE CHEST 1 VIEW COMPARISON:  04/28/2022, CT 04/29/2022 FINDINGS: Right-sided central venous port tip at the right atrium. Small moderate left effusion increased compared to prior radiograph from 03/11. Diffuse interstitial somewhat nodular process. Consolidation left lung base. Stable cardiomediastinal silhouette. No pneumothorax IMPRESSION: Small to moderate left pleural effusion, increased compared to prior radiograph. Diffuse interstitial somewhat nodular process suspected to be secondary to diffuse lymphangitic spread of tumor on recent CT, though there may be a superimposed component of edema or infection. There is interval consolidation of left base. Electronically Signed   By: Donavan Foil M.D.   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, unspecified laterality [facility]): chronic illness or injury with exacerbation, progression, or side effects of treatment that poses a threat to life or bodily functions Pain from bone metastases [facility]): chronic illness or injury with exacerbation, progression, or side effects of treatment that poses a threat to life or bodily functions Pleural effusion on left: acute illness or injury with systemic symptoms that poses a threat to life or bodily functions Shortness of breath: acute illness or injury with systemic symptoms Stage IV breast cancer in female [facility]): chronic illness or injury with exacerbation, progression, or side effects of treatment that poses a threat to life or bodily functions  Amount and/or Complexity of Data Reviewed Independent Historian:     Details: Fam/friend, hx External Data Reviewed: notes. Labs: ordered. Decision-making details documented in ED Course. Radiology: ordered and independent interpretation performed. Decision-making details documented in ED Course. ECG/medicine tests: ordered and independent interpretation performed. Decision-making details documented in ED Course.  Risk Prescription drug management. Decision regarding hospitalization.   Iv ns. Continuous pulse ox and cardiac monitoring. Labs ordered/sent. Imaging ordered.   Differential diagnosis  includes worsening effusion, worsening metastatic disease, referred/bone pain, acute abd or chest process, etc  . Dispo decision including potential need for admission considered - will get labs and imaging and reassess.   Reviewed nursing notes and prior charts for additional history. External reports reviewed. Additional history from: family/friend.   Cardiac monitor: sinus rhythm, rate 120. Pt notes baseline heart rate has been in 110-120 range - noted to be ~ 120 on two prior clinic visits.  Room air sats 87%. On 2 lites, sats 96%.   Labs reviewed/interpreted by me -  wbc 11. Hgb normal.   Xrays reviewed/interpreted by me - increased left effusion.   Dilaudid iv, zofran iv. Lr bolus.   Pain improved.  Given mild hypoxia on room air, increased effusion, sob, etc - will consult hospitalists for admission. May benefit from palliative care consult during admission. Possible radiology pleurocentesis for symptom improvement.            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

## 2022-05-07 NOTE — Assessment & Plan Note (Addendum)
-  suspect secondary to new liver metastasis -has occasional alcohol use but plans on discontinuing

## 2022-05-07 NOTE — Assessment & Plan Note (Addendum)
-  secondary to moderate left pleural effusion likely malignant source -requiring 2L on presentation -thoracentesis with fluid study ordered  -keep on continuous telemetry

## 2022-05-07 NOTE — ED Triage Notes (Signed)
Patient here POV from Home.  Endorses Tightness to upper ABD that began yesterday. No fever. Some nausea. 1 Episode of Emesis. No Diarrhea.   History of Cancer. Had CT imaging last week which showed diffuse progression of Pleural effusions and new liver lesions.   NAD Noted during Triage. A&Ox4. GCS 15. BIB Wheelchair.

## 2022-05-07 NOTE — H&P (Signed)
History and Physical    Patient: Ellen [name] [id number] DOB: 1995/08/16 DOA: 05/07/2022 DOS: the patient was seen and examined on 05/07/2022 PCP: Pcp, No  Patient coming from: [place] [facility]  Chief Complaint:  Chief Complaint  Patient presents with   Abdominal Pain   HPI: Ellen [name] is a 27 y.o. 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   Started to have upper mostly epigastric abdominal pain yesterday with associated nausea. Vomiting started today. No diarrhea and has regular bowel movements. Pain feels like a tight pain across the top. Has low appetite.   Recently presented to oncology with shortness of breath and wheezing last week which has now worsened and she has dyspnea on exertion with short distances. She was recently prescribed Z-pack, steroid and albuterol. States albuterol inhaler has helped this most. Had repeat CT Chest abdominal pelvis w contrast on 04/29/22 revealing new moderate left pleura effusion with extensive pleural thickening and nodularity. Enlargement of bilateral axillary and sub pectoral lumpy nodes and soft tissues nodules. Numerous new, hypodense metastatic liver lesions. She was evaluated by [facility] oncologist Dr. Wetzel Bjornstad on 05/05/22 who has recommended obtaining liver biopsy and thoracentesis with pathology/cytology of both specimens.   In the ED, she was afebrile but tachycardic, tachypnea requiring 2L via [place].  Has leukocytosis of 11.4. Hgb of 13.4.  Na, K, Cr within normal limits Lipase is normal AST of 133, ALT of 98, Alk phos of 102 all of which has trended up compare to prior.      Review of Systems: As mentioned in the history of present illness. All other systems reviewed and are negative. Past Medical History:  Diagnosis Date   ADHD (attention deficit hyperactivity disorder)    Anemia     Anxiety    Asthma    Exercise Induced   Cancer ([place])    Depression    Family history of pancreatic cancer    Family history of thyroid cancer    GERD (gastroesophageal reflux disease)    Herpes simplex    Personal history of chemotherapy 08/2020   Past Surgical History:  Procedure Laterality Date   BREAST BIOPSY Left 08/2020   PORTACATH PLACEMENT Right 08/23/2020   Procedure: INSERTION PORT-A-CATH;  Surgeon: [name], MD;  Location: [facility];  Service: General;  Laterality: Right;   ROBOTIC ASSISTED BILATERAL SALPINGO OOPHERECTOMY Bilateral 07/16/2021   Procedure: XI ROBOTIC ASSISTED BILATERAL SALPINGO OOPHORECTOMY;  Surgeon: Lafonda Mosses, MD;  Location: [facility];  Service: Gynecology;  Laterality: Bilateral;   Social History:  reports that she has never smoked. She has never used smokeless tobacco. She reports that she does not currently use alcohol. She reports current drug use. Drug: Marijuana.  Allergies  Allergen Reactions   Other Other (See Comments)    Pt states that she is allergic to the fabric adhesives; It tears her skin.   Azithromycin Hives    Family History  Problem Relation Age of Onset   Depression Mother    Hyperlipidemia Mother    Thyroid cancer Mother 78       papillary thyroid cancer   Hypertension Father    Atrial fibrillation Father    Irritable bowel syndrome Father        also brother   ADD / ADHD Brother    Bipolar disorder Brother    Diabetes Maternal Grandfather  Heart attack Paternal Great-grandfather 61   Pancreatic cancer Maternal Great-grandfather 88       (MGM's father)   Colon cancer Neg Hx    Breast cancer Neg Hx    Ovarian cancer Neg Hx    Endometrial cancer Neg Hx    Prostate cancer Neg Hx     Prior to Admission medications   Medication Sig Start Date End Date Taking? Authorizing Provider  acyclovir (ZOVIRAX) 800 MG tablet Take 1 tablet (800 mg total) by mouth 2 (two) times daily as needed (flare  up). 10/03/20   Gardenia Phlegm, NP  acyclovir ointment (ZOVIRAX) 5 % Apply 1 application  topically 4 (four) times daily as needed (outbreak). 08/02/20   [provider]  albuterol (PROVENTIL) (2.5 MG/3ML) 0.083% nebulizer solution Take 3 mLs (2.5 mg total) by nebulization every 6 (six) hours as needed for wheezing or shortness of breath. 04/28/22   Gardenia Phlegm, NP  albuterol (VENTOLIN HFA) 108 (90 Base) MCG/ACT inhaler 2 puffs inhaled every 4-6 hours PRN 03/21/22   Gardenia Phlegm, NP  azithromycin (ZITHROMAX Z-PAK) 250 MG tablet Take 2 tab on day 1 then 1 tab daily until complete 04/08/22   Gardenia Phlegm, NP  Calcium Carb-Cholecalciferol 500-10 MG-MCG TABS Take by mouth.    [provider]  diphenoxylate-atropine (LOMOTIL) 2.5-0.025 MG tablet Take 1 tablet by mouth 4 (four) times daily as needed for diarrhea or loose stools. 02/26/22   Gardenia Phlegm, NP  fluticasone (FLOVENT HFA) 110 MCG/ACT inhaler Inhale 2 puffs into the lungs in the morning and at bedtime. 04/28/22   Gardenia Phlegm, NP  gabapentin (NEURONTIN) 300 MG capsule Take 1 capsule (300 mg total) by mouth 3 (three) times daily. 04/18/22   [name], [name], NP  LORazepam (ATIVAN) 1 MG tablet Take 1 tablet (1 mg total) by mouth at bedtime as needed for anxiety. 05/02/22   Gardenia Phlegm, NP  methylPREDNISolone (MEDROL DOSEPAK) 4 MG TBPK tablet Taper 6,5,4,3,2,1 04/28/22   [name], [name], NP  ondansetron (ZOFRAN-ODT) 8 MG disintegrating tablet Dissolve 1 tablet (8 mg total) in mouth every 8 (eight) hours as needed for nausea or vomiting. 04/18/22   Gardenia Phlegm, NP  pantoprazole (PROTONIX) 40 MG tablet Take 1 tablet (40 mg total) by mouth daily. 10/30/21   Gardenia Phlegm, NP  prochlorperazine (COMPAZINE) 10 MG tablet Take 1 tablet (10 mg total) by mouth every 6 (six) hours as needed for nausea or vomiting. 09/20/21   Gardenia Phlegm, NP  venlafaxine XR (EFFEXOR-XR) 150 MG 24 hr capsule Take 1 capsule (150 mg total) by mouth daily with breakfast. Patient taking differently: Take 225 mg by mouth daily with breakfast. 01/07/21   [name], MD    Physical Exam: Vitals:   05/07/22 1819 05/07/22 1830 05/07/22 1900 05/07/22 1949  BP:  (!) 131/93 (!) 141/100 (!) 117/102  Pulse: (!) 121 (!) 110 (!) 116 (!) 118  Resp: (!) 22 (!) 22 (!) 22 20  Temp:    97.9 F (36.6 C)  TempSrc:    Oral  SpO2: 92% 98% 93% 96%  Weight:      Height:       Constitutional: NAD, calm, comfortable, chronically ill appearing but non-toxic young female laying in bed Eyes: lids and conjunctivae normal ENMT: Mucous membranes are moist. Neck: normal, supple Respiratory: clear to auscultation bilaterally, no wheezing, no crackles. Normal respiratory effort. No accessory muscle use.  Cardiovascular: Regular rate and  rhythm, no murmurs / rubs / gallops. No extremity edema.  Abdomen: soft, non-distended, mild epigastric tenderness, Bowel sounds positive.  Musculoskeletal: no clubbing / cyanosis. No joint deformity upper and lower extremities. Good ROM, no contractures. Normal muscle tone.  Skin: no rashes, lesions, ulcers.  Neurologic: CN 2-12 grossly intact. Strength 5/5 in all 4.  Psychiatric: Normal judgment and insight. Alert and oriented x 3. Normal mood. Data Reviewed:  See HPI   Assessment and Plan: * Acute respiratory failure with hypoxia ([place]) -secondary to moderate left pleural effusion likely malignant source -requiring 2L on presentation -thoracentesis with fluid study ordered  -keep on continuous telemetry  Abdominal pain -secondary to worsening malignancy and growing pleural effusion -lipase within normal limits and abnormal LFTs likely from metastatic disease -PRN antiemetics -PRN IV opioid for pain  Abnormal liver enzymes -suspect secondary to new liver metastasis -has occasional alcohol use but plans  on discontinuing  Primary malignant neoplasm of breast with metastasis (HCC) New liver lesions Moderate left pleural effusion -Recent CT C/A/P shows progression of her metastatic disease with likely lymphangitic spread. There is new hypodense metastatic liver lesions. Has outpatient plan on 4/1 to obtain liver biopsy and thoracentesis with pathology/cytology of both specimens. Will need to consult oncology in the morning to assess whether they want this performed while she is inpatient -Have ordered thoracentesis for left pleural effusion at this time  -pt previously on chemotherapy about 3 weeks ago but has plans to trial radiation. She has met with palliative care outpatient once and has started her advanced directives with her mother being the ultimate decision maker. Palliative consult placed for them to continue discussion with her tomorrow. Pt remains in good spirit despite her poor prognosis.  -follows with Dr. Lindi Adie oncology  Depression -continue Effexor and PRN ativan      Advance Care Planning:   Code Status: Full Code   Consults: palliative consult placed in Epic Needs to consult oncology formally in the morning  Family Communication: mother at bedside  Severity of Illness: The appropriate patient status for this patient is INPATIENT. Inpatient status is judged to be reasonable and necessary in order to provide the required intensity of service to ensure the patient's safety. The patient's presenting symptoms, physical exam findings, and initial radiographic and laboratory data in the context of their chronic comorbidities is felt to place them at high risk for further clinical deterioration. Furthermore, it is not anticipated that the patient will be medically stable for discharge from the hospital within 2 midnights of admission.   * I certify that at the point of admission it is my clinical judgment that the patient will require inpatient hospital care spanning beyond 2  midnights from the point of admission due to high intensity of service, high risk for further deterioration and high frequency of surveillance required.*  Author: Orene Desanctis, DO 05/07/2022 10:27 PM  For on call review [email or web address].

## 2022-05-08 ENCOUNTER — Other Ambulatory Visit: Payer: Self-pay | Admitting: Radiology

## 2022-05-08 ENCOUNTER — Ambulatory Visit
Admission: RE | Admit: 2022-05-08 | Discharge: 2022-05-08 | Disposition: A | Discharge status: Home / Self Care | Payer: Medicaid Other | Source: Ambulatory Visit | Attending: Radiation Oncology | Admitting: Radiation Oncology

## 2022-05-08 ENCOUNTER — Ambulatory Visit
Admission: RE | Admit: 2022-05-08 | Discharge: 2022-05-08 | Disposition: A | Discharge status: Home / Self Care | Payer: Medicaid Other | Source: Ambulatory Visit | Attending: Urology | Admitting: Urology

## 2022-05-08 ENCOUNTER — Inpatient Hospital Stay (HOSPITAL_COMMUNITY): Payer: Medicaid Other

## 2022-05-08 VITALS — BP 97/75 | HR 104 | Temp 97.6°F | Resp 20 | Ht 65.0 in | Wt 141.0 lb

## 2022-05-08 DIAGNOSIS — Z515 Encounter for palliative care: Secondary | ICD-10-CM

## 2022-05-08 DIAGNOSIS — Z7189 Other specified counseling: Secondary | ICD-10-CM

## 2022-05-08 DIAGNOSIS — C50919 Malignant neoplasm of unspecified site of unspecified female breast: Secondary | ICD-10-CM

## 2022-05-08 DIAGNOSIS — J9601 Acute respiratory failure with hypoxia: Secondary | ICD-10-CM | POA: Diagnosis not present

## 2022-05-08 DIAGNOSIS — C50912 Malignant neoplasm of unspecified site of left female breast: Secondary | ICD-10-CM

## 2022-05-08 LAB — BODY FLUID CELL COUNT WITH DIFFERENTIAL
Lymphs, Fluid: 43 %
Monocyte-Macrophage-Serous Fluid: 41 % — ABNORMAL LOW (ref 50–90)
Neutrophil Count, Fluid: 16 % (ref 0–25)
Total Nucleated Cell Count, Fluid: 811 cu mm (ref 0–1000)

## 2022-05-08 LAB — BASIC METABOLIC PANEL
Anion gap: 7 (ref 5–15)
BUN: 8 mg/dL (ref 6–20)
CO2: 23 mmol/L (ref 22–32)
Calcium: 8 mg/dL — ABNORMAL LOW (ref 8.9–10.3)
Chloride: 105 mmol/L (ref 98–111)
Creatinine, Ser: 0.65 mg/dL (ref 0.44–1.00)
GFR, Estimated: >60 mL/min (ref >60)
Glucose, Bld: 81 mg/dL (ref 70–99)
Potassium: 4.4 mmol/L (ref 3.5–5.1)
Sodium: 135 mmol/L (ref 135–145)

## 2022-05-08 LAB — PROTEIN, PLEURAL OR PERITONEAL FLUID: Total protein, fluid: <3 g/dL

## 2022-05-08 LAB — LACTATE DEHYDROGENASE, PLEURAL OR PERITONEAL FLUID: LD, Fluid: 206 U/L — ABNORMAL HIGH (ref 3–23)

## 2022-05-08 LAB — CBC
HCT: 38.8 % (ref 36.0–46.0)
Hemoglobin: 12.2 g/dL (ref 12.0–15.0)
MCH: 29.1 pg (ref 26.0–34.0)
MCHC: 31.4 g/dL (ref 30.0–36.0)
MCV: 92.6 fL (ref 80.0–100.0)
Platelets: 280 10*3/uL (ref 150–400)
RBC: 4.19 MIL/uL (ref 3.87–5.11)
RDW: 15.5 % (ref 11.5–15.5)
WBC: 10.1 10*3/uL (ref 4.0–10.5)
nRBC: 0 % (ref 0.0–0.2)

## 2022-05-08 LAB — GLUCOSE, PLEURAL OR PERITONEAL FLUID: Glucose, Fluid: 76 mg/dL

## 2022-05-08 LAB — HIV ANTIBODY (ROUTINE TESTING W REFLEX): HIV Screen 4th Generation wRfx: NONREACTIVE

## 2022-05-08 MED ORDER — MORPHINE SULFATE (PF) 2 MG/ML IV SOLN: 2.0000 mg | INTRAVENOUS | Status: DC | PRN

## 2022-05-08 MED ORDER — LORAZEPAM 1 MG PO TABS
1.0000 mg | ORAL_TABLET | Freq: Every evening | ORAL | Status: DC | PRN
  Administered 2022-05-08: 1 mg via ORAL
  Filled 2022-05-08: qty 1

## 2022-05-08 MED ORDER — LIDOCAINE HCL 1 % IJ SOLN
INTRAMUSCULAR | Status: AC
  Filled 2022-05-08: qty 20

## 2022-05-08 MED ORDER — LORAZEPAM 1 MG PO TABS: 1.0000 mg | ORAL_TABLET | Freq: Every evening | ORAL | Status: DC | PRN

## 2022-05-08 MED ORDER — LORAZEPAM 0.5 MG PO TABS
0.5000 mg | ORAL_TABLET | Freq: Two times a day (BID) | ORAL | Status: DC | PRN
  Administered 2022-05-09: 0.5 mg via ORAL
  Filled 2022-05-08: qty 1

## 2022-05-08 MED ORDER — HYDROCODONE-ACETAMINOPHEN 5-325 MG PO TABS: 1.0000 | ORAL_TABLET | Freq: Four times a day (QID) | ORAL | Status: DC | PRN

## 2022-05-08 MED ORDER — LORAZEPAM 0.5 MG PO TABS: 0.5000 mg | ORAL_TABLET | Freq: Two times a day (BID) | ORAL | Status: DC | PRN

## 2022-05-08 MED ORDER — OXYCODONE HCL 5 MG PO TABS
2.5000 mg | ORAL_TABLET | Freq: Four times a day (QID) | ORAL | Status: DC | PRN
  Administered 2022-05-08 – 2022-05-09 (×3): 2.5 mg via ORAL
  Filled 2022-05-08 (×3): qty 1

## 2022-05-08 MED ORDER — SODIUM CHLORIDE 0.9% FLUSH
10.0000 mL | INTRAVENOUS | Status: DC | PRN
  Administered 2022-05-09: 10 mL

## 2022-05-08 MED ORDER — CHLORHEXIDINE GLUCONATE CLOTH 2 % EX PADS
6.0000 | MEDICATED_PAD | Freq: Every day | CUTANEOUS | Status: DC
  Administered 2022-05-08: 6 via TOPICAL

## 2022-05-08 NOTE — Progress Notes (Signed)
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  She reports tightness around the top of her abdomen as well with some shortness of breath and wheezing previously in the week. Repeat CT Chest abdominal pelvis w contrast on 04/29/22 revealing new moderate left pleura effusion with extensive pleural thickening and nodularity. Enlargement of bilateral axillary and sub pectoral lumpy nodes and soft tissues nodules. Numerous new, hypodense metastatic liver lesions. She was evaluated by [facility] oncologist Dr. Wetzel Bjornstad on 05/05/22 who has recommended obtaining liver biopsy and thoracentesis with pathology/cytology of both specimens.  Assessment & Plan:   Principal Problem:   Acute respiratory failure with hypoxia (HCC) Active Problems:   Depression   Primary malignant neoplasm of breast with metastasis (HCC)   Pleural effusion   Abnormal liver enzymes   Abdominal pain   Acute respiratory failure with hypoxia (HCC) -secondary to moderate left pleural effusion likely malignant source -requiring 2L nasal cannula on presentation -thoracentesis performed 1.7 L withdrawn, labs pending -keep on continuous telemetry   Abdominal pain -Questionably secondary to above versus metastatic process -lipase within normal limits and abnormal LFTs likely from metastatic disease -PRN antiemetics -PRN IV opioid for pain   Abnormal liver enzymes -suspect secondary to new liver metastasis -has occasional alcohol use but plans on discontinuing -Labs continue to trend  upwards, outpatient oncology recommended liver biopsy, currently scheduled 05/19/22 -unclear if this can be done inpatient in the interim.   Primary malignant neoplasm of breast with metastasis ([place]) New liver lesions Moderate left pleural effusion -Recent CT C/A/P shows progression of her metastatic disease with likely lymphangitic spread. There is new hypodense metastatic liver lesions.  -Thoracentesis completed as above, labs pending -Unclear if liver biopsy can be performed while inpatient, has 1 scheduled on 05/19/22 -pt previously on chemotherapy about 3 weeks ago but has plans to trial radiation. She has met with palliative care outpatient once and has started her advanced directives with her mother being the ultimate decision maker.  -Palliative care following and consult -Pt remains in good spirit despite her poor prognosis.  -Dr. Lindi Adie is local oncology, also follows [facility]zel [facility] at [facility]   Depression -continue Effexor and PRN ativan  DVT prophylaxis: Holding in the setting of above possible need for liver biopsy Code Status: Full Family Communication: Mother at bedside  Status is: Inpatient  Dispo: The patient is from: Home              Anticipated d/c is to: Home              Anticipated d/c date is: 24 to 48 hours pending clinical course              Patient currently not medically stable for discharge, awaiting further workup, possible intervention and procedure  Consultants:  Oncology, interventional radiology  Procedures:  Thoracentesis 05/08/2022 Possible liver biopsy pending  Antimicrobials:  None indicated  Subjective: No acute issues or events overnight, abdominal pain appears to be improving on current regimen.  Denies nausea vomiting headache fever chills diarrhea or constipation.  Objective: Vitals:   05/07/22  1900 05/07/22 1949 05/08/22 0030 05/08/22 0438  BP: (!) 141/100 (!) 117/102 120/80 121/75  Pulse: (!) 116 (!) 118 93 95  Resp: (!) 22 20 20 20    Temp:  97.9 F (36.6 C) 97.6 F (36.4 C) 98.3 F (36.8 C)  TempSrc:  Oral Oral Oral  SpO2: 93% 96% 95% 96%  Weight:      Height:        Intake/Output Summary (Last 24 hours) at 05/08/2022 0723 Last data filed at 05/08/2022 [id number] Gross per 24 hour  Intake 1104.9 ml  Output 400 ml  Net 704.9 ml   Filed Weights   05/07/22 1426  Weight: 64.3 kg    Examination:  General:  Pleasantly resting in bed, No acute distress. HEENT:  Normocephalic atraumatic.  Sclerae nonicteric, noninjected.  Extraocular movements intact bilaterally. Neck:  Without mass or deformity.  Trachea is midline. Lungs:  Clear to auscultate bilaterally without rhonchi, wheeze, or rales. Heart:  Regular rate and rhythm.  Without murmurs, rubs, or gallops. Abdomen:  Soft, nontender, nondistended.  Without guarding or rebound. Extremities: Without cyanosis, clubbing, edema, or obvious deformity.  Data Reviewed: I have personally reviewed following labs and imaging studies  CBC: Recent Labs  Lab 05/02/22 1001 05/07/22 1550 05/08/22 0523  WBC 8.8 11.4* 10.1  NEUTROABS 5.9  --   --   HGB 11.9* 13.4 12.2  HCT 36.7 41.6 38.8  MCV 89.5 89.3 92.6  PLT 340 320 [id number]   Basic Metabolic Panel: Recent Labs  Lab 05/02/22 1001 05/07/22 1550 05/08/22 0523  NA 141 138 135  K 3.6 4.2 4.4  CL 107 105 105  CO2 27 23 23   GLUCOSE 125* 86 81  BUN 13 6 8   CREATININE 0.60 0.65 0.65  CALCIUM 9.4 9.2 8.0*  MG 1.9  --   --   PHOS 3.4  --   --    GFR: Estimated Creatinine Clearance: 95 mL/min (by C-G formula based on SCr of 0.65 mg/dL). Liver Function Tests: Recent Labs  Lab 05/02/22 1001 05/07/22 1550  AST 86* 133*  ALT 56* 98*  ALKPHOS 93 162*  BILITOT 0.3 0.4  PROT 6.9 6.6  ALBUMIN 4.0 3.7   Recent Labs  Lab 05/07/22 1550  LIPASE <10*   No results for input(s): "AMMONIA" in the last 168 hours. Coagulation Profile: No results for input(s): "INR", "PROTIME" in the last 168 hours. Cardiac Enzymes: No  results for input(s): "CKTOTAL", "CKMB", "CKMBINDEX", "TROPONINI" in the last 168 hours. BNP (last 3 results) No results for input(s): "PROBNP" in the last 8760 hours. HbA1C: No results for input(s): "HGBA1C" in the last 72 hours. CBG: No results for input(s): "GLUCAP" in the last 168 hours. Lipid Profile: No results for input(s): "CHOL", "HDL", "LDLCALC", "TRIG", "CHOLHDL", "LDLDIRECT" in the last 72 hours. Thyroid Function Tests: No results for input(s): "TSH", "T4TOTAL", "FREET4", "T3FREE", "THYROIDAB" in the last 72 hours. Anemia Panel: No results for input(s): "VITAMINB12", "FOLATE", "FERRITIN", "TIBC", "IRON", "RETICCTPCT" in the last 72 hours. Sepsis Labs: No results for input(s): "PROCALCITON", "LATICACIDVEN" in the last 168 hours.  Recent Results (from the past 240 hour(s))  Resp panel by RT-PCR (RSV, Flu A&B, Covid) Anterior Nasal Swab     Status: None   Collection Time: 05/07/22  3:39 PM   Specimen: Anterior Nasal Swab  Result Value Ref Range Status   SARS Coronavirus 2 by RT PCR NEGATIVE NEGATIVE Final    Comment: (NOTE) SARS-CoV-2 target nucleic acids are NOT DETECTED.  The SARS-CoV-2  RNA is generally detectable in upper respiratory specimens during the acute phase of infection. The lowest concentration of SARS-CoV-2 viral copies this assay can detect is 138 copies/mL. A negative result does not preclude SARS-Cov-2 infection and should not be used as the sole basis for treatment or other patient management decisions. A negative result may occur with  improper specimen collection/handling, submission of specimen other than nasopharyngeal swab, presence of viral mutation(s) within the areas targeted by this assay, and inadequate number of viral copies(<138 copies/mL). A negative result must be combined with clinical observations, patient history, and epidemiological information. The expected result is Negative.  Fact Sheet for Patients:   EntrepreneurPulse.com.au  Fact Sheet for Healthcare Providers:  IncredibleEmployment.be  This test is no t yet approved or cleared by the [place] FDA and  has been authorized for detection and/or diagnosis of SARS-CoV-2 by FDA under an Emergency Use Authorization (EUA). This EUA will remain  in effect (meaning this test can be used) for the duration of the COVID-19 declaration under Section 564(b)(1) of the Act, 21 U.S.C.section 360bbb-3(b)(1), unless the authorization is terminated  or revoked sooner.       Influenza A by PCR NEGATIVE NEGATIVE Final   Influenza B by PCR NEGATIVE NEGATIVE Final    Comment: (NOTE) The Xpert Xpress SARS-CoV-2/FLU/RSV plus assay is intended as an aid in the diagnosis of influenza from Nasopharyngeal swab specimens and should not be used as a sole basis for treatment. Nasal washings and aspirates are unacceptable for Xpert Xpress SARS-CoV-2/FLU/RSV testing.  Fact Sheet for Patients: EntrepreneurPulse.com.au  Fact Sheet for Healthcare Providers: IncredibleEmployment.be  This test is not yet approved or cleared by the [place] FDA and has been authorized for detection and/or diagnosis of SARS-CoV-2 by FDA under an Emergency Use Authorization (EUA). This EUA will remain in effect (meaning this test can be used) for the duration of the COVID-19 declaration under Section 564(b)(1) of the Act, 21 U.S.C. section 360bbb-3(b)(1), unless the authorization is terminated or revoked.     Resp Syncytial Virus by PCR NEGATIVE NEGATIVE Final    Comment: (NOTE) Fact Sheet for Patients: EntrepreneurPulse.com.au  Fact Sheet for Healthcare Providers: IncredibleEmployment.be  This test is not yet approved or cleared by the [place] FDA and has been authorized for detection and/or diagnosis of SARS-CoV-2 by FDA under an Emergency Use  Authorization (EUA). This EUA will remain in effect (meaning this test can be used) for the duration of the COVID-19 declaration under Section 564(b)(1) of the Act, 21 U.S.C. section 360bbb-3(b)(1), unless the authorization is terminated or revoked.  Performed at [facility], [address] [place], [place] [address]     Radiology Studies: [facility] Chest [facility] 1 View  Result Date: 05/07/2022 CLINICAL DATA:  Shortness of breath EXAM: PORTABLE CHEST 1 VIEW COMPARISON:  04/28/2022, CT 04/29/2022 FINDINGS: Right-sided central venous port tip at the right atrium. Small moderate left effusion increased compared to prior radiograph from 03/11. Diffuse interstitial somewhat nodular process. Consolidation left lung base. Stable cardiomediastinal silhouette. No pneumothorax IMPRESSION: Small to moderate left pleural effusion, increased compared to prior radiograph. Diffuse interstitial somewhat nodular process suspected to be secondary to diffuse lymphangitic spread of tumor on recent CT, though there may be a superimposed component of edema or infection. There is interval consolidation of left base. Electronically Signed   By: Donavan Foil M.D.   On: 05/07/2022 16:07    Scheduled Meds:  albuterol  2.5 mg Nebulization TID   budesonide  0.25  mg Nebulization BID   Chlorhexidine Gluconate Cloth  6 each Topical Daily   gabapentin  300 mg Oral TID   lidocaine-prilocaine   Topical Once   pantoprazole  40 mg Oral Daily   venlafaxine XR  225 mg Oral Q breakfast   Continuous Infusions:  sodium chloride 75 mL/hr at 05/08/22 [id number]   promethazine (PHENERGAN) injection (IM or IVPB) Stopped (05/07/22 2306)     LOS: 1 day   Time spent: 56min  Wylee Ogden C Dayelin Balducci, DO [facility]  If 7PM-7AM, please contact night-coverage [email or web address]  05/08/2022, 7:23 AM

## 2022-05-08 NOTE — Consult Note (Signed)
Radiation Oncology         ([phone]) [phone] ________________________________  Inpatient Consultation  Name: Ellen [name] MRN: [id number]  Date of Service: 05/07/2022 DOB: 12/20/1995  [id number], Loleta Dicker, MD  REFERRING PHYSICIAN: [name], MD  DIAGNOSIS: 27 y/o woman with a painful left beast mass, invading into the left chest wall secondary to progressive Stage IV breast cancer    ICD-10-CM   1. Stage IV breast cancer in female [facility])  C50.919 Lactate dehydrogenase (pleural or peritoneal fluid)    Lactate dehydrogenase (pleural or peritoneal fluid)    Body fluid cell count with differential    Body fluid cell count with differential    Protein, pleural or peritoneal fluid    Protein, pleural or peritoneal fluid    Glucose, pleural or peritoneal fluid    Glucose, pleural or peritoneal fluid    Body fluid culture w Gram Stain    Body fluid culture w Gram Stain    2. Pleural effusion on left  J90 Lactate dehydrogenase (pleural or peritoneal fluid)    Lactate dehydrogenase (pleural or peritoneal fluid)    Body fluid cell count with differential    Body fluid cell count with differential    Protein, pleural or peritoneal fluid    Protein, pleural or peritoneal fluid    Glucose, pleural or peritoneal fluid    Glucose, pleural or peritoneal fluid    Body fluid culture w Gram Stain    Body fluid culture w Gram Stain    3. Shortness of breath  R06.02 Lactate dehydrogenase (pleural or peritoneal fluid)    Lactate dehydrogenase (pleural or peritoneal fluid)    Body fluid cell count with differential    Body fluid cell count with differential    Protein, pleural or peritoneal fluid    Protein, pleural or peritoneal fluid    Glucose, pleural or peritoneal fluid    Glucose, pleural or peritoneal fluid    Body fluid culture w Gram Stain    Body fluid culture w Gram Stain    4. Pain from bone metastases (HCC)  G89.3 Lactate dehydrogenase (pleural or peritoneal fluid)   C79.51  Lactate dehydrogenase (pleural or peritoneal fluid)    Body fluid cell count with differential    Body fluid cell count with differential    Protein, pleural or peritoneal fluid    Protein, pleural or peritoneal fluid    Glucose, pleural or peritoneal fluid    Glucose, pleural or peritoneal fluid    Body fluid culture w Gram Stain    Body fluid culture w Gram Stain    5. 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 female seen at the request of Dr. Lindi Adie. She is well known to our service, having received palliative radiation previously in 2022. She had an excellent response to treatment with resolution of the pain in her low back and right pelvis. Her oncologic  history is as per below:     Oncology History  Primary malignant neoplasm of breast with metastasis ([place])  08/15/2020 Imaging    Large mass in the medial left breast measuring 3.5 cm.  Prominent left axillary lymph node cluster of adjacent lymph nodes, irregular nodule right middle lobe 1.1 cm, aggressive lesion right second rib cortical destruction, multiple lucent lesions throughout the thoracic spine including T1 vertebral body and T2, irregular multilobar lesion central left hepatic lobe 4.3 x 4.7 cm.  Several satellite lesions in the left lateral hepatic lobe.  Multiple sclerotic lesions in the bones iliac wings, acetabular, medial iliac bones, sacrum multiple lesions lumbar spine L3-L4 and L5    08/17/2020  Initial Diagnosis    Biopsy revealed IDC with DCIS grade 2, ER 30%, PR 20%, HER2 equivocal by IHC, FISH positive, Ki-67 25%, lymph node positive      Genetic Testing    Negative genetic testing:  No pathogenic variants detected on the [employer] CancerNext-Expanded + RNAinsight panel. The report date is 09/06/2020.    The CancerNext-Expanded + RNAinsight gene panel offered by [employer] and includes sequencing and rearrangement analysis for the following 77 genes: AIP, ALK, APC, ATM, AXIN2, BAP1, BARD1, BLM, BMPR1A, BRCA1, BRCA2, BRIP1, CDC73, CDH1, CDK4, CDKN1B, CDKN2A, CHEK2, CTNNA1, DICER1, FANCC, FH, FLCN, GALNT12, KIF1B, LZTR1, MAX, MEN1, MET, MLH1, MSH2, MSH3, MSH6, MUTYH, NBN, NF1, NF2, NTHL1, PALB2, PHOX2B, PMS2, POT1, PRKAR1A, PTCH1, PTEN, RAD51C, RAD51D, RB1, RECQL, RET, SDHA, SDHAF2, SDHB, SDHC, SDHD, SMAD4, SMARCA4, SMARCB1, SMARCE1, STK11, SUFU, TMEM127, TP53, TSC1, TSC2, VHL and XRCC2 (sequencing and deletion/duplication); EGFR, EGLN1, HOXB13, KIT, MITF, PDGFRA, POLD1 and POLE (sequencing only); EPCAM and GREM1 (deletion/duplication only). RNA data is routinely analyzed for use in variant interpretation for all genes.    09/17/2021 - 10/07/2021 Chemotherapy    Patient is on Treatment Plan : BREAST METASTATIC fam-trastuzumab deruxtecan-nxki (Enhertu) q21d     09/17/2021 - 10/29/2021 Chemotherapy    Patient is on Treatment Plan : BREAST METASTATIC Fam-Trastuzumab Deruxtecan-nxki (Enhertu) (5.4) q21d     11/19/2021 - 01/15/2022 Chemotherapy    Patient is on Treatment Plan : BREAST Carboplatin + Gemcitabine D1,8 q21d     02/14/2022 - 04/18/2022 Chemotherapy    Patient is on Treatment Plan : BREAST METASTATIC Sacituzumab govitecan-hziy [name]) D1,8 q21d     Carcinoma of breast metastatic to bone ([place])  08/17/2020 Initial Diagnosis    Carcinoma of breast metastatic to bone ([place]); Goserelin/Zoladex started and to be given every 4 weeks      Genetic Testing    Negative genetic testing:  No  pathogenic variants detected on the [employer] CancerNext-Expanded + RNAinsight panel. The report date is 09/06/2020.    The CancerNext-Expanded + RNAinsight gene panel offered by [employer] and includes sequencing and rearrangement analysis for the following 77 genes: AIP, ALK, APC, ATM, AXIN2, BAP1, BARD1, BLM, BMPR1A, BRCA1, BRCA2, BRIP1, CDC73, CDH1, CDK4, CDKN1B, CDKN2A, CHEK2, CTNNA1, DICER1, FANCC, FH, FLCN, GALNT12, KIF1B, LZTR1, MAX, MEN1, MET, MLH1, MSH2, MSH3, MSH6, MUTYH, NBN, NF1, NF2, NTHL1, PALB2, PHOX2B, PMS2, POT1, PRKAR1A, PTCH1, PTEN, RAD51C, RAD51D, RB1, RECQL, RET, SDHA, SDHAF2, SDHB, SDHC, SDHD, SMAD4, SMARCA4, SMARCB1, SMARCE1, STK11, SUFU, TMEM127, TP53, TSC1, TSC2, VHL and XRCC2 (sequencing and deletion/duplication); EGFR, EGLN1, HOXB13, KIT, MITF, PDGFRA, POLD1 and POLE (sequencing only); EPCAM and GREM1 (deletion/duplication only). RNA data is routinely analyzed for use in variant interpretation for all genes.    08/18/2020 - 08/30/2020 Radiation Therapy    Palliative radiation to the lumbar  and pelvic bone; 30 Gy in 10 fractions    08/31/2020 - 12/18/2020 Adjuvant Chemotherapy    Started with Herceptin/Perjeta on 08/31/2020 and Taxotere on 09/04/2020; she was hospitalized with dehydration and refractory diarrhea for 11 days, she resumed treatment with Taxotere (dose reduced by 10mg /m2) and Herceptin only (perjeta discontinued) on 09/26/2020, reintroduced 10/17/2020    09/03/2020 Cancer Staging    Staging form: Breast, AJCC 8th Edition - Clinical stage from 09/03/2020: Stage IV (cT4b, cN1, cM1, G2, ER+, PR+, HER2+) - Signed by Gardenia Phlegm, NP on 10/03/2020 Stage prefix: Initial diagnosis Method of lymph node assessment: Clinical Histologic grading system: 3 grade system    01/08/2021 - 08/27/2021 Adjuvant Chemotherapy    Herceptin/Perjeta every 3 weeks, Zometa every 12 weeks added 01/29/2021    07/07/2021 PET scan    1. Interval decrease in size of the dominant left  breast mass which now exhibits mild low level FDG uptake with SUV max of 2.7. Small nodule within the left breast exhibits moderate tracer uptake within SUV max of 5.6 and is suspicious for residual/recurrent local tumor. 2. Mild tracer uptake associated with prominent left axillary lymph node with SUV max of 3.35. Residual tracer avid nodal metastasis not excluded. 3. The previously noted liver metastasis are no longer visible on the CT images from today's study. There is no abnormal increased uptake within the left hepatic lobe above background liver activity to suggest metabolically active tumor metastasis. 4. Interval increase in sclerosis associated with previously noted lytic bone metastasis without significant increased uptake above background bone marrow activity compatible with healing multifocal bone metastasis. 5. New pathologic fracture involves the L3 vertebral body where there was extensive lytic changes noted on the previous CT. The collapsed vertebral body is now sclerotic with mild low level FDG uptake. 6. Similar appearance of soft tissue infiltration within the anterior mediastinal fat which exhibits mild FDG uptake on today's study. 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    07/16/2021 Surgery    S/p BSO, benign pathology      08/26/2021 Mammogram    Mammo diagnostic breast tomosynthesis left: There is an additional  irregular mass in the lower inner position at posterior depth. This was  further evaluated with ultrasound.   Mammo US breast limited left: There is a 0.7 cm x 0.5 cm x 0.7 cm  irregularly shaped, hypoechoic mass with indistinct margins seen in the  left breast at 8 o'clock, 8 cm from the nipple. This mass is 1.6 cm medial  to the palpable mass as detailed above.     08/26/2021 Pathology Results    New left breast mass 8:00 biopsy: Invasive carcinoma with mixed ductal and lobular and focal micropapillary features.  Grade 3, invasive  carcinoma measures 9 mm with an associated lymphoid reaction.  ER negative PR negative HER2 1+.   Left breast mass 2:00 biopsy: Invasive carcinoma with mixed ductal and lobular features Nottingham grade 2 measuring 1.4 cm.  ER 9% (low positive), PR negative, HER2 1+.    09/16/2021 PET scan    1. Interval progression of hypermetabolic metastatic disease. 2. Hypermetabolic bilateral axillary lymphadenopathy, subcarinal and bilateral hilar lymphadenopathy, new/progressive. 3. New hypermetabolic nodularity in the lower left breast is compatible with progressive malignancy. 4. New hypermetabolic mixed lytic and sclerotic left pelvic bone metastases as detailed. 5. Persistent patchy hypermetabolism in the anterior mediastinum associated with mixed soft tissue and fat density, favor thymic hyperplasia.    09/17/2021 - 10/07/2021 Chemotherapy  Patient is on Treatment Plan : BREAST METASTATIC fam-trastuzumab deruxtecan-nxki (Enhertu) q21d     09/17/2021 - 10/29/2021 Chemotherapy    Patient is on Treatment Plan : BREAST METASTATIC Fam-Trastuzumab Deruxtecan-nxki (Enhertu) (5.4) q21d     11/11/2021 PET scan     1. Hypermetabolic left breast soft tissue and skin thickening is increased  from prior examination and suspicious for worsening primary malignancy.   2. There is evidence of interval worsening hypermetabolic adenopathy.  Primary differential consideration is worsening nodal metastasis, however  given the distribution the possibility of sarcoid-like reaction in the  setting of therapy is raised. There is worsening hypermetabolic left  axillary adenopathy with new areas of hypermetabolic adenopathy seen in the  left subpectoral, internal mammary, cardiophrenic, right axillary,  mediastinal, and bilateral hilar nodal stations. A few nonspecific FDG avid  nonenlarged left pelvic/inguinal nodes are also seen. Tissue sampling  should be considered for definitive assessment, otherwise close  follow-up  is recommended.   3. Interval increased hypermetabolic activity associated with osseous  metastasis in the left hemipelvis. Other nonavid sclerotic bone lesions may  reflect sites of treated disease.   Electronically Reviewed by:  Mahala Menghini, MD, [facility]  Electronically Reviewed on:  11/11/2021 6:13 PM     11/19/2021 - 01/15/2022 Chemotherapy    Patient is on Treatment Plan : BREAST Carboplatin + Gemcitabine D1,8 q21d     02/14/2022 - 04/18/2022 Chemotherapy    Patient is on Treatment Plan : BREAST METASTATIC Sacituzumab govitecan-hziy [name]) D1,8 q21d         CURRENT THERAPY: treatment change discussion  INTERVAL HISTORY: She has met with Dr. Wetzel Bjornstad at [facility] and has been on multiple systemic drug regimens and unfortunately, shown disease progression despite treatment.  The left breast mass became painful back in December so we had seen her and were preparing to start palliative radiation at that time but prior to starting treatment, she was excepted into a clinical trial with Dr. Wetzel Bjornstad at [facility] so she opted to forego the palliative radiation and was ultimately, started on Trodelvy on 02/14/2022.  Unfortunately, her disease restaging CT C/A/P from 04/29/2022 shows significant disease progression with lymphangitic spread throughout the lungs, numerous liver lesions, increased metastatic lymphadenopathy in the chest with subcutaneous nodules and stable, widespread osseous metastatic disease.  She was also noted to have a new, moderate left pleural effusion with extensive pleural thickening and nodularity so after meeting back with Dr. Ouida Sills on 05/05/2022, the recommendation was to proceed with thoracentesis for both diagnostic and therapeutic purposes, as well as biopsy of a liver lesion for updated pathology to help further direct systemic treatment options.  These procedures were scheduled for 05/19/2022 but in the interim, she developed increased shortness of breath, abdominal  pain and intractable nausea/vomiting prompting evaluation in the emergency department on 05/07/2022 and subsequent hospital admission.  Thoracentesis procedure was performed earlier today and they were able to remove 1.8 L of fluid from the left lung.  She reports a significant improvement in her breathing since the time of that procedure.  She has had increasing left breast/chest wall discomfort over the past month and has noted some scabbing around her left nipple but no erosion of the tumor through the skin at this point.  The left breast is diffusely hard and immobile causing almost constant pulling sensation in the left chest wall.  We have been consulted to discuss proceeding with palliative radiation to the left breast mass/chest wall while awaiting updated pathology to determine  next steps regarding systemic therapy.  PREVIOUS RADIATION THERAPY: Yes  08/21/20 - 09/04/20:   The sites of painful metastases in the lumbar spine and right pelvis were treated to 30 Gy in 10 fractions of 3 Gy each.  PAST MEDICAL HISTORY:  Past Medical History:  Diagnosis Date   ADHD (attention deficit hyperactivity disorder)    Anemia    Anxiety    Asthma    Exercise Induced   Cancer ([place])    Depression    Family history of pancreatic cancer    Family history of thyroid cancer    GERD (gastroesophageal reflux disease)    Herpes simplex    Personal history of chemotherapy 08/2020      PAST SURGICAL HISTORY: Past Surgical History:  Procedure Laterality Date   BREAST BIOPSY Left 08/2020   PORTACATH PLACEMENT Right 08/23/2020   Procedure: INSERTION PORT-A-CATH;  Surgeon: [name], MD;  Location: [facility];  Service: General;  Laterality: Right;   ROBOTIC ASSISTED BILATERAL SALPINGO OOPHERECTOMY Bilateral 07/16/2021   Procedure: XI ROBOTIC ASSISTED BILATERAL SALPINGO OOPHORECTOMY;  Surgeon: Lafonda Mosses, MD;  Location: [facility];  Service: Gynecology;  Laterality: Bilateral;     FAMILY HISTORY:  Family History  Problem Relation Age of Onset   Depression Mother    Hyperlipidemia Mother    Thyroid cancer Mother 44       papillary thyroid cancer   Hypertension Father    Atrial fibrillation Father    Irritable bowel syndrome Father        also brother   ADD / ADHD Brother    Bipolar disorder Brother    Diabetes Maternal Grandfather    Heart attack Paternal Great-grandfather 21   Pancreatic cancer Maternal Great-grandfather 88       (MGM's father)   Colon cancer Neg Hx    Breast cancer Neg Hx    Ovarian cancer Neg Hx    Endometrial cancer Neg Hx    Prostate cancer Neg Hx     SOCIAL HISTORY:  Social History   Socioeconomic History   Marital status: Single    Spouse name: Not on file   Number of children: Not on file   Years of education: Not on file   Highest education level: Not on file  Occupational History   Not on file  Tobacco Use   Smoking status: Never   Smokeless tobacco: Never  Vaping Use   Vaping Use: Every day   Substances: THC  Substance and Sexual Activity   Alcohol use: Not Currently    Comment: 3 to 4 a week   Drug use: Yes    Types: Marijuana    Comment: Daily   Sexual activity: Yes  Other Topics Concern   Not on file  Social History Narrative   Not on file   Social Determinants of Health   Financial Resource Strain: High Risk (05/08/2021)   Overall Financial Resource Strain (CARDIA)    Difficulty of Paying Living Expenses: Very hard  Food Insecurity: No Food Insecurity (05/08/2022)   Hunger Vital Sign    Worried About Running Out of Food in the Last Year: Never true    Ran Out of Food in the Last Year: Never true  Transportation Needs: No Transportation Needs (05/08/2022)   PRAPARE - Hydrologist (Medical): No    Lack of Transportation (Non-Medical): No  Physical Activity: Not on file  Stress: Not on file  Social Connections: Not on  file  Intimate Partner Violence: Not At Risk  (05/08/2022)   Humiliation, Afraid, Rape, and Kick questionnaire    Fear of Current or Ex-Partner: No    Emotionally Abused: No    Physically Abused: No    Sexually Abused: No    ALLERGIES: Other and Azithromycin  MEDICATIONS:  Current Facility-Administered Medications  Medication Dose Route Frequency Provider Last Rate Last Admin   0.9 %  sodium chloride infusion   Intravenous Continuous Tu, Ching T, DO 75 mL/hr at 05/08/22 [id number] Infusion Verify at 05/08/22 [id number]   albuterol (PROVENTIL) (2.5 MG/3ML) 0.083% nebulizer solution 2.5 mg  2.5 mg Nebulization Q6H PRN Tu, Ching T, DO       albuterol (PROVENTIL) (2.5 MG/3ML) 0.083% nebulizer solution 2.5 mg  2.5 mg Nebulization TID Hosie Poisson, MD   2.5 mg at 05/08/22 0834   budesonide (PULMICORT) nebulizer solution 0.25 mg  0.25 mg Nebulization BID Tu, Ching T, DO   0.25 mg at 05/08/22 [id number]   Chlorhexidine Gluconate Cloth 2 % PADS 6 each  6 each Topical Daily Hosie Poisson, MD       gabapentin (NEURONTIN) capsule 300 mg  300 mg Oral TID Tu, Ching T, DO   300 mg at 05/08/22 0849   HYDROmorphone (DILAUDID) injection 1 mg  1 mg Intravenous Q3H PRN Tu, Ching T, DO   1 mg at 05/08/22 1008   lidocaine (XYLOCAINE) 1 % (with pres) injection            lidocaine-prilocaine (EMLA) cream   Topical Once Lajean Saver, MD       LORazepam (ATIVAN) tablet 1 mg  1 mg Oral QHS PRN Tu, Ching T, DO       morphine (PF) 2 MG/ML injection 2 mg  2 mg Intravenous Q3H PRN Tu, Ching T, DO       ondansetron (ZOFRAN) injection 4 mg  4 mg Intravenous Q6H PRN Tu, Ching T, DO   4 mg at 05/08/22 1008   pantoprazole (PROTONIX) EC tablet 40 mg  40 mg Oral Daily Tu, Ching T, DO   40 mg at 05/08/22 0848   promethazine (PHENERGAN) 12.5 mg in sodium chloride 0.9 % 50 mL IVPB  12.5 mg Intravenous Q6H PRN Tu, Ching T, DO   Stopped at 05/07/22 2306   sodium chloride flush (NS) 0.9 % injection 10-40 mL  10-40 mL Intracatheter PRN Hosie Poisson, MD       venlafaxine XR (EFFEXOR-XR) 24 hr  capsule 225 mg  225 mg Oral Q breakfast Tu, Ching T, DO        REVIEW OF SYSTEMS:  On review of systems, the patient reports that she is doing well overall.  She reports that the shortness of breath has improved significantly since the time of the thoracentesis procedure.  She has chronic left breast/left chest wall discomfort that is unchanged recently and denies any recent cough, fevers, chills, night sweats or unintended weight changes.  She denies any bowel or bladder disturbances, but was having significant epigastric abdominal pain on admission.  The abdominal pain has since resolved and she reports that her nausea/vomiting are well-controlled currently.  She denies any new musculoskeletal or joint aches or pains. A complete review of systems is obtained and is otherwise negative.    PHYSICAL EXAM:  Wt Readings from Last 3 Encounters:  05/07/22 141 lb 12.1 oz (64.3 kg)  05/02/22 141 lb 12.8 oz (64.3 kg)  04/28/22 145 lb 1.6 oz (65.8 kg)   Temp  Readings from Last 3 Encounters:  05/08/22 98.2 F (36.8 C) (Oral)  05/02/22 (!) 97.5 F (36.4 C)  04/28/22 97.9 F (36.6 C) (Temporal)   BP Readings from Last 3 Encounters:  05/08/22 103/78  05/02/22 (!) 140/92  04/28/22 134/89   Pulse Readings from Last 3 Encounters:  05/08/22 (!) 110  05/02/22 (!) 106  04/28/22 (!) 123   Pain Assessment Pain Score: 8  (for thoracentesis)/10  In general this is a well appearing Caucasian woman she in no acute distress.  She's alert and oriented x4 and appropriate throughout the examination. Cardiopulmonary assessment is negative for acute distress and she exhibits normal effort.   KPS = 90  100 - Normal; no complaints; no evidence of disease. 90   - Able to carry on normal activity; minor signs or symptoms of disease. 80   - Normal activity with effort; some signs or symptoms of disease. 34   - Cares for self; unable to carry on normal activity or to do active work. 60   - Requires occasional  assistance, but is able to care for most of his personal needs. 50   - Requires considerable assistance and frequent medical care. 6   - Disabled; requires special care and assistance. 7   - Severely disabled; hospital admission is indicated although death not imminent. 9   - Very sick; hospital admission necessary; active supportive treatment necessary. 10   - Moribund; fatal processes progressing rapidly. 0     - Dead  Karnofsky DA, Abelmann [place], Craver LS and Burchenal [facility] [phone]) The use of the nitrogen mustards in the palliative treatment of carcinoma: with particular reference to bronchogenic carcinoma Cancer 1 634-56  LABORATORY DATA:  Lab Results  Component Value Date   WBC 10.1 05/08/2022   HGB 12.2 05/08/2022   HCT 38.8 05/08/2022   MCV 92.6 05/08/2022   PLT 280 05/08/2022   Lab Results  Component Value Date   NA 135 05/08/2022   K 4.4 05/08/2022   CL 105 05/08/2022   CO2 23 05/08/2022   Lab Results  Component Value Date   ALT 98 (H) 05/07/2022   AST 133 (H) 05/07/2022   ALKPHOS 162 (H) 05/07/2022   BILITOT 0.4 05/07/2022     RADIOGRAPHY: DG Chest 1 View  Result Date: 05/08/2022 CLINICAL DATA:  27 year old female with metastatic breast cancer status post ultrasound-guided left side thoracentesis this morning. EXAM: CHEST  1 VIEW COMPARISON:  Portable chest 05/07/2022 and earlier. FINDINGS: Portable AP upright view at 1116 hours. Improved left lung base ventilation and no pneumothorax identified. Left pleural effusion seems largely resolved. Right chest power port again noted. Diffuse reticulonodular lung opacity persists. Mediastinal contours remain within normal limits. No areas of worsening ventilation. Stable visualized osseous structures.  Negative visible bowel gas. IMPRESSION: 1. Largely resolved left pleural effusion, improved left lung base ventilation, and no pneumothorax following thoracentesis. 2. 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 Electronically  Signed   By: Genevie Ann M.D.   On: 05/08/2022 11:26   DG Chest Port 1 View  Result Date: 05/07/2022 CLINICAL DATA:  Shortness of breath EXAM: PORTABLE CHEST 1 VIEW COMPARISON:  04/28/2022, CT 04/29/2022 FINDINGS: Right-sided central venous port tip at the right atrium. Small moderate left effusion increased compared to prior radiograph from 03/11. Diffuse interstitial somewhat nodular process. Consolidation left lung base. Stable cardiomediastinal silhouette. No pneumothorax IMPRESSION: Small to moderate left pleural effusion, increased compared to prior radiograph. Diffuse interstitial somewhat nodular process suspected to be secondary  to diffuse lymphangitic spread of tumor on recent CT, though there may be a superimposed component of edema or infection. There is interval consolidation of left base. Electronically Signed   By: Donavan Foil M.D.   On: 05/07/2022 16:07   CT CHEST ABDOMEN PELVIS W CONTRAST  Result Date: 05/01/2022 CLINICAL DATA:  Metastatic breast cancer restaging * Tracking Code: BO * EXAM: CT CHEST, ABDOMEN, AND PELVIS WITH CONTRAST TECHNIQUE: Multidetector CT imaging of the chest, abdomen and pelvis was performed following the standard protocol during bolus administration of intravenous contrast. RADIATION DOSE REDUCTION: This exam was performed according to the departmental dose-optimization program which includes automated exposure control, adjustment of the mA and/or kV according to patient size and/or use of iterative reconstruction technique. CONTRAST:  129mL OMNIPAQUE IOHEXOL 300 MG/ML  SOLN COMPARISON:  01/27/2022 FINDINGS: CT CHEST FINDINGS Cardiovascular: Right chest port catheter. Normal heart size. No pericardial effusion. Mediastinum/Nodes: Interval enlargement bilateral axillary and sub pectoral lymph nodes and soft tissue nodules, largest left axillary node measuring 1.8 x 1.1 cm, previously 1.1 x 0.9 cm (series 2, image 16). Numerous prominent and enlarged mediastinal and  bilateral hilar lymph nodes, some of which are also enlarged, for example a paraesophageal node measuring 1.3 x 0.9 cm, previously subcentimeter (series 2, image 38). Thyroid gland, trachea, and esophagus demonstrate no significant findings. Lungs/Pleura: New, moderate left pleural effusion with extensive pleural thickening and nodularity, pleural nodule of the medial left lung base measuring 1.4 x 0.8 cm (series 2, image 54). Very significant increase in diffuse bilateral interlobular septal thickening and lymphangitic nodularity with diffusely scattered ground-glass airspace opacity throughout the lungs. Diffuse bilateral bronchial wall thickening. Musculoskeletal: Redemonstrated large mass of the left breast with overlying skin thickening (series 2, image 30). No acute osseous findings. CT ABDOMEN PELVIS FINDINGS Hepatobiliary: Numerous new, generally subtle and ill-defined hypodense liver lesions, for example in the anterior right lobe of the liver measuring 1.6 x 1.5 cm (series 2, image 53). No gallstones, gallbladder wall thickening, or biliary dilatation. Pancreas: Unremarkable. No pancreatic ductal dilatation or surrounding inflammatory changes. Spleen: Normal in size without significant abnormality. Adrenals/Urinary Tract: Adrenal glands are unremarkable. Kidneys are normal, without renal calculi, solid lesion, or hydronephrosis. Bladder is unremarkable. Stomach/Bowel: Stomach is within normal limits. Appendix appears normal. No evidence of bowel wall thickening, distention, or inflammatory changes. Vascular/Lymphatic: No significant vascular findings are present. No enlarged abdominal or pelvic lymph nodes. Reproductive: No mass or other abnormality. Other: No abdominal wall hernia or abnormality. No ascites. Musculoskeletal: No acute osseous findings. No significant change in widespread sclerotic osseous metastatic disease throughout all included portions of the axial skeleton, including a pathologic  wedge deformity of L3 and a pathologic inferior endplate deformity of L5 (series 5, image 86). IMPRESSION: 1. Interval enlargement of bilateral axillary and sub pectoral lymph nodes and soft tissue nodules. 2. New, moderate left pleural effusion with extensive pleural thickening and nodularity. 3. Very significant increase in diffuse bilateral interlobular septal thickening and lymphangitic nodularity throughout the lungs with diffusely scattered ground-glass airspace opacity. 4. Numerous new, hypodense metastatic liver lesions. 5. No significant change in widespread sclerotic osseous metastatic disease throughout all included portions of the axial skeleton, including a pathologic wedge deformity of L3 and a pathologic inferior endplate deformity of L5. 6. Constellation of findings is consistent with significant interval worsening of widespread metastatic disease. Electronically Signed   By: Delanna Ahmadi M.D.   On: 05/01/2022 09:26   DG Chest 2 View  Result Date: 04/28/2022  CLINICAL DATA:  Cough and wheezing, metastatic breast cancer EXAM: CHEST - 2 VIEW COMPARISON:  03/21/2022 FINDINGS: Right IJ power port catheter tip SVC RA junction as before. Normal heart size and central vascularity. Similar pattern of diffuse reticulonodular interstitial opacities throughout both lungs. This remains nonspecific for diffuse pulmonary lymphangitic spread of tumor versus infectious/inflammatory process, or pulmonary edema. No large effusion or pneumothorax. Trachea midline. No acute osseous finding. IMPRESSION: Similar pattern of diffuse reticulonodular interstitial opacities throughout both lungs as above. No interval change. Electronically Signed   By: Jerilynn Mages.  Shick M.D.   On: 04/28/2022 12:14      IMPRESSION/PLAN: 1. 27 y.o. woman with a painful left beast mass, invading into the left chest wall secondary to progressive Stage IV breast cancer. Today, we talked to the patient and family about the findings and workup thus  far. We discussed the natural history of metastatic breast cancer and general treatment, highlighting the role of radiotherapy in the management. We discussed the available radiation techniques, and focused on the details and logistics of delivery.  The recommendation is for a 2-week course of daily, palliative radiation to the left breast and left chest wall.  We reviewed the anticipated acute and late sequelae associated with radiation in this setting and she was encouraged to ask questions that were answered to her stated satisfaction.   At the conclusion of our conversation, she is in agreement to proceed with the recommended 2-week course of daily, palliative radiation to the left breast and left chest wall.  She has freely signed written consent to proceed and a copy of this document is in her medical record.  We have tentatively scheduled her for CT simulation/treatment planning this afternoon at 2:30 PM, in anticipation of beginning her daily treatments in the very near future.  She understands that if she is stable for discharge prior to completing treatment, we can certainly continue her daily radiation treatments on an outpatient basis.  We enjoyed meeting with her and her mom today and look forward to continuing to participate in her care.  We personally spent 70 minutes in this encounter including chart review, reviewing radiological studies, meeting face-to-face with the patient, entering orders and completing documentation.    Nicholos Johns, PA-C    [name], MD  [facility] Direct Dial: [phone]  Fax: [phone] .com  Skype  LinkedIn

## 2022-05-08 NOTE — Progress Notes (Signed)
Nursing interview for Primary malignant neoplasm of breast with metastasis ([place]).  Patient identity verified. Patient reports LT breast pain 4/10, and occasional SOB w/ exertion. No other issues conveyed at this time.  Meaningful use complete. LMP- 08/2020 (stopped due to chemotherapy). NO chances of pregnancy.  Vitals- BP 97/75 (BP Location: Left Arm, Patient Position: Sitting, Cuff Size: Normal)   Pulse (!) 104   Temp 97.6 F (36.4 C) (Oral)   Resp 20   Ht 5\' 5"  (1.651 m)   Wt 141 lb (64 kg)   SpO2 97%   PF (!) 4 L/min   BMI 23.46 kg/m   This concludes the interview.   Leandra Kern, LPN

## 2022-05-08 NOTE — Progress Notes (Addendum)
Oncology Patient was seen and examined status post paracentesis removing 1.8 L of fluid from the chest.  She appears to be breathing so much better.  She also tells me that the right upper quadrant abdominal distention and discomfort is also improved.  She is not in any significant pain.  Treatment plan: Await the result of the pleural fluid cytology and breast prognostic panel to determine the next treatment plan Palliative radiation therapy to the breast  Patient has plans to go to [place] middle of next month.  I discussed with her that she needs to be set clinically stable to be able to survive that trip  Additional chemo or other treatments will depend on the cytology and pathology report.  She has exhausted a lot of treatment options and no matter what treatment we choose her likelihood of response will remain fairly low.  If she looks stable tomorrow morning she could be discharged home.

## 2022-05-08 NOTE — Consult Note (Signed)
Consultation Note Date: 05/08/2022   Patient Name: Ellen [name]  DOB: [birth year]  MRN: [id number]  Age / Sex: 27 y.o., female  PCP: Pcp, No Referring Physician: Little Ishikawa, MD  Reason for Consultation: Establishing goals of care  HPI/Patient Profile: 27 y.o. 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 Now with abdominal pain with evidence of progressing malignancy.   Clinical Assessment and Goals of Care: Consult received and chart reviewed. I met today at Ellen [name]'s bedside with mother, Ellen [name], while Ellen [name] is in radiation. Ellen [name] shares that they have filled out Advance Directive and hoping to have it notarized for completion. I will request spiritual care to assist with notarizing before discharge. Ellen [name] shares that they have had very open conversations regarding Arcelia's diagnosis, expectations, and prognosis. They are all very realistic and accepting of situation. Goal is to live life to the fullest as long as possible. Ellen [name] shares that Ellen [name]'s goal is to go to [place] mid April for 2 weeks on a trip she has had planned for some time now. Concern has been voiced for pleural effusion accumulation and thoracentesis need. I question if pigtail could be placed to drain during the trip if needed - this should be reasonable assuming no other obstacles arise. I have passed on this information and goal to [facility] palliative NP provider [facility].   I returned to speak with Ellen [name]. She is visiting with her sister and in good spirits. They are laughing and playing with pictures on phone. Ellen [name] and I review Advance Directive and adjust to better reflect  her wishes. Hopeful to be notarized tomorrow prior to likely discharge. Ellen [name] and I discuss medication for pain and her Lorazepam. I will order small dose of OxyIR (will avoid hydrocodone due to Tylenol additive). Lorazepam adjusted to reflect smaller dose as needed during the day for anxiety or nausea and larger dose at night for anxiety/sleep. Breta has no further concerns at this time. She will follow up with outpatient palliative as needed.   All questions/concerns addressed. Emotional support provided.   Primary Decision Maker PATIENT    SUMMARY OF RECOMMENDATIONS   - Already connected with palliative at [facility]: Full code   Symptom Management:  Lorazepam 0.5 mg BID PRN anxiety/nausea and 1 mg QHS PRN anxiety/sleep.  Pain: OxyIR 2.5 mg q6h PRN.  Advised to avoid taking pain medication with Ativan as can cause oversedation and lethargy.   Prognosis:  Overall long term prognosis poor with advancing cancer. She remains in good spirits and good functional and nutritional state.   Discharge Planning: Home with Palliative Services      Primary Diagnoses: Present on Admission:  Acute respiratory failure with hypoxia ([place])  Depression  Primary malignant neoplasm of breast with metastasis ([place])   I have reviewed the medical record,  interviewed the patient and family, and examined the patient. The following aspects are pertinent.  Past Medical History:  Diagnosis Date   ADHD (attention deficit hyperactivity disorder)    Anemia    Anxiety    Asthma    Exercise Induced   Cancer (HCC)    Depression    Family history of pancreatic cancer    Family history of thyroid cancer    GERD (gastroesophageal reflux disease)    Herpes simplex    Personal history of chemotherapy 08/2020   Social History   Socioeconomic History   Marital status: Single    Spouse name: Not on file   Number of children: Not on file   Years of education: Not on  file   Highest education level: Not on file  Occupational History   Not on file  Tobacco Use   Smoking status: Never   Smokeless tobacco: Never  Vaping Use   Vaping Use: Every day   Substances: THC  Substance and Sexual Activity   Alcohol use: Not Currently    Comment: 3 to 4 a week   Drug use: Yes    Types: Marijuana    Comment: Daily   Sexual activity: Yes  Other Topics Concern   Not on file  Social History Narrative   Not on file   Social Determinants of Health   Financial Resource Strain: High Risk (05/08/2021)   Overall Financial Resource Strain (CARDIA)    Difficulty of Paying Living Expenses: Very hard  Food Insecurity: No Food Insecurity (05/08/2022)   Hunger Vital Sign    Worried About Running Out of Food in the Last Year: Never true    Ran Out of Food in the Last Year: Never true  Transportation Needs: No Transportation Needs (05/08/2022)   PRAPARE - Hydrologist (Medical): No    Lack of Transportation (Non-Medical): No  Physical Activity: Not on file  Stress: Not on file  Social Connections: Not on file   Family History  Problem Relation Age of Onset   Depression Mother    Hyperlipidemia Mother    Thyroid cancer Mother 55       papillary thyroid cancer   Hypertension Father    Atrial fibrillation Father    Irritable bowel syndrome Father        also brother   ADD / ADHD Brother    Bipolar disorder Brother    Diabetes Maternal Grandfather    Heart attack Paternal Great-grandfather 41   Pancreatic cancer Maternal Great-grandfather 88       (MGM's father)   Colon cancer Neg Hx    Breast cancer Neg Hx    Ovarian cancer Neg Hx    Endometrial cancer Neg Hx    Prostate cancer Neg Hx    Scheduled Meds:  albuterol  2.5 mg Nebulization TID   budesonide  0.25 mg Nebulization BID   Chlorhexidine Gluconate Cloth  6 each Topical Daily   gabapentin  300 mg Oral TID   lidocaine-prilocaine   Topical Once   pantoprazole  40 mg Oral  Daily   venlafaxine XR  225 mg Oral Q breakfast   Continuous Infusions:  sodium chloride 75 mL/hr at 05/08/22 [id number]   promethazine (PHENERGAN) injection (IM or IVPB) Stopped (05/07/22 2306)   PRN Meds:.albuterol, HYDROmorphone (DILAUDID) injection, LORazepam, LORazepam, morphine injection, ondansetron (ZOFRAN) IV, promethazine (PHENERGAN) injection (IM or IVPB), sodium chloride flush Allergies  Allergen Reactions   Other Other (See Comments)  Pt states that she is allergic to the fabric adhesives; It tears her skin.   Azithromycin Hives   Review of Systems  Constitutional:  Negative for activity change and appetite change.       Breast pain  Neurological:  Negative for weakness.  Psychiatric/Behavioral:  Positive for sleep disturbance. The patient is nervous/anxious.     Physical Exam Vitals and nursing note reviewed.  Constitutional:      General: She is not in acute distress.    Appearance: Normal appearance. She is well-developed and well-groomed.  Cardiovascular:     Rate and Rhythm: Tachycardia present.  Pulmonary:     Effort: No tachypnea, accessory muscle usage or respiratory distress.  Abdominal:     General: Abdomen is flat.  Neurological:     Mental Status: She is alert and oriented to person, place, and time.  Psychiatric:        Mood and Affect: Mood normal.     Vital Signs: BP 132/88 (BP Location: Right Arm)   Pulse (!) 108   Temp 98.3 F (36.8 C) (Oral)   Resp 20   Ht 5\' 5"  (1.651 m)   Wt 64.3 kg   SpO2 94%   BMI 23.59 kg/m  Pain Scale: 0-10   Pain Score: 4    SpO2: SpO2: 94 % O2 Device:SpO2: 94 % O2 Flow Rate: .O2 Flow Rate (L/min): 4 L/min  IO: Intake/output summary:  Intake/Output Summary (Last 24 hours) at 05/08/2022 0842 Last data filed at 05/08/2022 0700 Gross per 24 hour  Intake 1104.9 ml  Output 400 ml  Net 704.9 ml    LBM:   Baseline Weight: Weight: 64.3 kg Most recent weight: Weight: 64.3 kg     Palliative  Assessment/Data:     Time In: 1410  Time Total: 75 min  Greater than 50%  of this time was spent counseling and coordinating care related to the above assessment and plan.  Signed by: Vinie Sill, NP Palliative Medicine Team Pager # [phone] (M-F 8a-5p) Team Phone # [phone] (Nights/Weekends)

## 2022-05-08 NOTE — Progress Notes (Signed)
[facility] Simulation and Treatment Planning Note   Name: Ellen [name] MRN: [id number]  Date:  05/08/2022            DOB: 06/22/1995  Status: inpatient    DIAGNOSIS: The encounter diagnosis was Primary malignant neoplasm of breast with metastasis ([place]).    SIDE: left   CONSENT VERIFIED: Yes.     SET UP: Patient is setup supine   IMMOBILIZATION: Following immobilization is used:Custom Moldable Pillow   NARRATIVE: The patient was brought to the [facility].  Identity was confirmed.  All relevant records and images related to the planned course of therapy were reviewed.  Then, the patient was positioned in a stable reproducible clinical set-up for radiation therapy.  CT images were obtained.  Skin markings were placed.  The CT images were loaded into the planning software where the target and avoidance structures were contoured.  The radiation prescription was entered and confirmed.   TREATMENT PLANNING NOTE:  Treatment planning then occurred. I have requested : 3D Simulation  I have requested a DVH of the following structures: heart, left lung and others noted in plan  ------------------------------------------------   [name], MD [place]: [phone]  Fax: [phone] .com  Skype  LinkedIn

## 2022-05-08 NOTE — Procedures (Signed)
Ultrasound-guided diagnostic and therapeutic left thoracentesis performed yielding 1.6 liters of yellow fluid. No immediate complications. Follow-up chest x-ray pending. The fluid was sent to the lab for preordered studies. EBL none.

## 2022-05-09 ENCOUNTER — Ambulatory Visit
Admission: RE | Admit: 2022-05-09 | Discharge: 2022-05-09 | Disposition: A | Discharge status: Home / Self Care | Payer: Medicaid Other | Source: Ambulatory Visit | Attending: Radiation Oncology | Admitting: Radiation Oncology

## 2022-05-09 ENCOUNTER — Inpatient Hospital Stay: Payer: Medicaid Other

## 2022-05-09 ENCOUNTER — Inpatient Hospital Stay: Payer: Medicaid Other | Admitting: Hematology and Oncology

## 2022-05-09 ENCOUNTER — Other Ambulatory Visit: Payer: Self-pay

## 2022-05-09 DIAGNOSIS — J9601 Acute respiratory failure with hypoxia: Secondary | ICD-10-CM | POA: Diagnosis not present

## 2022-05-09 DIAGNOSIS — C50919 Malignant neoplasm of unspecified site of unspecified female breast: Secondary | ICD-10-CM

## 2022-05-09 LAB — RAD ONC ARIA SESSION SUMMARY
Course Elapsed Days: 0
Plan Fractions Treated to Date: 1
Plan Prescribed Dose Per Fraction: 3 Gy
Plan Total Fractions Prescribed: 10
Plan Total Prescribed Dose: 30 Gy
Reference Point Dosage Given to Date: 3 Gy
Reference Point Session Dosage Given: 3 Gy
Session Number: 1

## 2022-05-09 MED ORDER — OXYCODONE-ACETAMINOPHEN 5-325 MG PO TABS: 1.0000 | ORAL_TABLET | ORAL | 0 refills | Status: DC | PRN

## 2022-05-09 MED ORDER — OXYCODONE HCL 5 MG PO TABS: 2.5000 mg | ORAL_TABLET | Freq: Four times a day (QID) | ORAL | 0 refills | Status: DC | PRN

## 2022-05-09 MED ORDER — LORAZEPAM 1 MG PO TABS: 1.0000 mg | ORAL_TABLET | Freq: Two times a day (BID) | ORAL | 1 refills | Status: DC | PRN

## 2022-05-09 MED ORDER — HEPARIN SOD (PORK) LOCK FLUSH 100 UNIT/ML IV SOLN
500.0000 [IU] | INTRAVENOUS | Status: AC | PRN
  Administered 2022-05-09: 500 [IU]

## 2022-05-09 NOTE — Progress Notes (Signed)
SATURATION QUALIFICATIONS: (This note is used to comply with regulatory documentation for home oxygen)  Patient Saturations on Room Air at Rest = 91%  Patient Saturations on Room Air while Ambulating = 84%  Patient Saturations on 3 Liters of oxygen while Ambulating = 92%

## 2022-05-09 NOTE — Progress Notes (Signed)
   05/09/22 1400  Spiritual Encounters  Type of Visit Initial  Care provided to: Pt and family  Conversation partners present during encounter Nurse  Referral source Patient request;Chaplain team  Reason for visit Advance directives  OnCall Visit No  Spiritual Framework  Presenting Themes Goals in life/care;Values and beliefs  Patient Stress Factors Major life changes  Family Stress Factors Major life changes  Goals  Self/Personal Goals  (Advanced Directives)   Met with patient and her mother at bedside this afternoon.  Mother of patient wanted to have AD completed by Notary today prior to 4 pm procedure.   Met with family and provided spiritual care in the form of counsel, comfort, listening, encouragement.

## 2022-05-09 NOTE — TOC Transition Note (Signed)
Transition of Care [facility]) - CM/SW Discharge Note   Patient Details  Name: Ellen [name] MRN: [id number] Date of Birth: [birth year]  Transition of Care [facility]) CM/SW Contact:  Dessa Phi, RN Phone Number: 05/09/2022, 1:57 PM   Clinical Narrative:  d/c home w/home 02-ordered, & sats noted.No preference-[employer] rep Darnelle Maffucci to deliver travel tank to rm prior d/c, & set to home. No further CM needs.    Final next level of care: Home/Self Care Barriers to Discharge: No Barriers Identified   Patient Goals and CMS Choice      Discharge Placement                         Discharge Plan and Services Additional resources added to the After Visit Summary for                  DME Arranged: Oxygen DME Agency: [facility] [employer] Date DME Agency Contacted: 05/09/22 Time DME Agency Contacted: [id number] Representative spoke with at DME Agency: [place] ([place]) Interventions SDOH Screenings   Food Insecurity: No Food Insecurity (05/08/2022)  Housing: Low Risk  (05/08/2022)  Transportation Needs: No Transportation Needs (05/08/2022)  Utilities: Not At Risk (05/08/2022)  Financial Resource Strain: High Risk (05/08/2021)  Tobacco Use: Low Risk  (05/08/2022)     Readmission Risk Interventions     No data to display

## 2022-05-09 NOTE — Discharge Summary (Signed)
Physician Discharge Summary  Ellen [name] [id number] DOB: 1995/09/08 DOA: 05/07/2022  PCP: Pcp, No  Admit date: 05/07/2022 Discharge date: 05/09/2022  Admitted From: Home Disposition:  Home  Recommendations for Outpatient Follow-up:  Follow up with PCP in 1-2 weeks Follow up with oncology/radiation as scheduled Repeat liver panel in the next 1 to 2 weeks to follow elevated liver enzymes  Home Health:None  Equipment/Devices:Oxygen 3L [place] with exertion  Discharge Condition:Stable  CODE STATUS:Full  Diet recommendation: As tolerated regular diet    Brief/Interim Summary: Ellen [name] is an unfortunate 27F with medical history significant of 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  She reports tightness around the top of her abdomen as well with some shortness of breath and wheezing previously in the week. Repeat CT Chest abdominal pelvis w contrast on 04/29/22 revealing new moderate left pleura effusion with extensive pleural thickening and nodularity. Enlargement of bilateral axillary and sub pectoral lumpy nodes and soft tissues nodules. Numerous new, hypodense metastatic liver lesions. She was evaluated by [facility] oncologist Dr. Wetzel Bjornstad on 05/05/22 who has recommended obtaining liver biopsy and thoracentesis with pathology/cytology of both specimens.   Patient admitted as above with acute respiratory failure and hypoxia in the setting of likely malignant pleural effusion.  Thoracentesis was performed on the 21st with marked improvement in symptoms -cytology pending, will review results with outpatient oncology.  Patient's pain is currently well-controlled on current regimen of oxycodone.  Patient is currently being  evaluated by radiation oncology for further treatment.  Patient's liver lesions will be biopsied on 05/19/2022.  We recommend at that time to also have her left pleural space evaluated by ultrasound if possible to ensure effusion has not reaccumulated.  Patient otherwise is stable and agreeable for discharge home at this time.  Discharge Diagnoses:  Principal Problem:   Acute respiratory failure with hypoxia (HCC) Active Problems:   Depression   Primary malignant neoplasm of breast with metastasis (HCC)   Pleural effusion   Abnormal liver enzymes   Abdominal pain  Acute respiratory failure with hypoxia (HCC) -secondary to moderate left pleural effusion likely malignant source, resolved with thoracentesis -Patient remains hypoxic, unclear if this is her new baseline given concern over malignancy.  Will continue oxygen at 3 L, would recommend close monitoring in the outpatient setting as this may be able to titrated down or even off if her respiratory status continues to improve.   Abdominal pain -Likely secondary to above, resolved with thoracentesis  -PRN antiemetics/analgesics   Abnormal liver enzymes -suspect secondary to new liver metastasis -has occasional alcohol use but plans on discontinuing -Liver biopsy pending April 1 -would repeat liver panel in the next 1 to 2 weeks as well.   Primary malignant neoplasm of breast with metastasis ([place]) New liver lesions New onset moderate left pleural effusion -Recent CT C/A/P shows progression of her metastatic disease with likely lymphangitic spread. There is new hypodense ? metastatic liver lesions.  -Thoracentesis completed as above, labs pending -Palliative care following and consult -Pt remains in good spirit despite her poor prognosis.  Unclear if patient has any further avenues or treatment options with chemotherapy, radiation initiated as above. -Dr. Lindi Adie is local oncology, also follows Dr. Wetzel Bjornstad at [facility]   Depression -continue  Effexor and  increased Ativan dosing to twice daily as needed  Discharge Instructions  Discharge Instructions     Discharge patient   Complete by: As directed    Discharge disposition: 01-Home or Self Care   Discharge patient date: 05/09/2022      Allergies as of 05/09/2022       Reactions   Other Other (See Comments)   Pt states that she is allergic to the fabric adhesives; It tears her skin.   Azithromycin Hives        Medication List     STOP taking these medications    azithromycin 250 MG tablet Commonly known as: Zithromax Z-Pak   methylPREDNISolone 4 MG Tbpk tablet Commonly known as: MEDROL DOSEPAK       TAKE these medications    acetaminophen 325 MG tablet Commonly known as: TYLENOL Take 650 mg by mouth every 6 (six) hours as needed for mild pain.   acyclovir 800 MG tablet Commonly known as: ZOVIRAX Take 1 tablet (800 mg total) by mouth 2 (two) times daily as needed (flare up).   acyclovir ointment 5 % Commonly known as: ZOVIRAX Apply 1 application  topically 4 (four) times daily as needed (outbreak).   albuterol 108 (90 Base) MCG/ACT inhaler Commonly known as: VENTOLIN HFA 2 puffs inhaled every 4-6 hours PRN   albuterol (2.5 MG/3ML) 0.083% nebulizer solution Commonly known as: PROVENTIL Take 3 mLs (2.5 mg total) by nebulization every 6 (six) hours as needed for wheezing or shortness of breath.   Calcium Carb-Cholecalciferol 500-10 MG-MCG Tabs Take by mouth.   diphenoxylate-atropine 2.5-0.025 MG tablet Commonly known as: LOMOTIL Take 1 tablet by mouth 4 (four) times daily as needed for diarrhea or loose stools.   fluticasone 110 MCG/ACT inhaler Commonly known as: Flovent HFA Inhale 2 puffs into the lungs in the morning and at bedtime.   gabapentin 300 MG capsule Commonly known as: NEURONTIN Take 1 capsule (300 mg total) by mouth 3 (three) times daily.   LORazepam 1 MG tablet Commonly known as: ATIVAN Take 1 tablet (1 mg total) by mouth 2  (two) times daily as needed for anxiety. What changed: when to take this   ondansetron 8 MG disintegrating tablet Commonly known as: ZOFRAN-ODT Dissolve 1 tablet (8 mg total) in mouth every 8 (eight) hours as needed for nausea or vomiting.   oxyCODONE 5 MG immediate release tablet Commonly known as: Oxy IR/ROXICODONE Take 0.5 tablets (2.5 mg total) by mouth every 6 (six) hours as needed for up to 7 days for moderate pain.   pantoprazole 40 MG tablet Commonly known as: Protonix Take 1 tablet (40 mg total) by mouth daily.   prochlorperazine 10 MG tablet Commonly known as: COMPAZINE Take 1 tablet (10 mg total) by mouth every 6 (six) hours as needed for nausea or vomiting.   venlafaxine XR 150 MG 24 hr capsule Commonly known as: EFFEXOR-XR Take 1 capsule (150 mg total) by mouth daily with breakfast. What changed: how much to take               Durable Medical Equipment  (From admission, onward)           Start     Ordered   05/09/22 1126  For home use only DME oxygen  Once       Question Answer Comment  Length of Need Lifetime   Mode or (Route) Nasal cannula   Liters per Minute 3   Frequency Continuous (stationary and portable oxygen unit needed)  Oxygen conserving device No   Oxygen delivery system Gas      05/09/22 1125            Allergies  Allergen Reactions   Other Other (See Comments)    Pt states that she is allergic to the fabric adhesives; It tears her skin.   Azithromycin Hives    Consultations: Oncology, interventional radiology  Procedures/Studies: US THORACENTESIS ASP PLEURAL SPACE W/IMG GUIDE  Result Date: 05/08/2022 INDICATION: Patient with history of metastatic breast cancer, dyspnea, left pleural effusion. Request received for diagnostic and therapeutic left thoracentesis. EXAM: ULTRASOUND GUIDED DIAGNOSTIC AND THERAPEUTIC LEFT THORACENTESIS MEDICATIONS: 8 mL 1% lidocaine COMPLICATIONS: None immediate. PROCEDURE: An ultrasound guided  thoracentesis was thoroughly discussed with the patient and questions answered. The benefits, risks, alternatives and complications were also discussed. The patient understands and wishes to proceed with the procedure. Written consent was obtained. Ultrasound was performed to localize and mark an adequate pocket of fluid in the left chest. The area was then prepped and draped in the normal sterile fashion. 1% Lidocaine was used for local anesthesia. Under ultrasound guidance a 6 Fr Safe-T-Centesis catheter was introduced. Thoracentesis was performed. The catheter was removed and a dressing applied. FINDINGS: A total of approximately 1.6 liters of yellow fluid was removed. Samples were sent to the laboratory as requested by the clinical team. IMPRESSION: Successful ultrasound guided diagnostic and therapeutic left thoracentesis yielding 1.6 liters of pleural fluid. Read by: Rowe Robert, PA-C Electronically Signed   By: Markus Daft M.D.   On: 05/08/2022 13:52   DG Chest 1 View  Result Date: 05/08/2022 CLINICAL DATA:  27 year old female with metastatic breast cancer status post ultrasound-guided left side thoracentesis this morning. EXAM: CHEST  1 VIEW COMPARISON:  Portable chest 05/07/2022 and earlier. FINDINGS: Portable AP upright view at 1116 hours. Improved left lung base ventilation and no pneumothorax identified. Left pleural effusion seems largely resolved. Right chest power port again noted. Diffuse reticulonodular lung opacity persists. Mediastinal contours remain within normal limits. No areas of worsening ventilation. Stable visualized osseous structures.  Negative visible bowel gas. IMPRESSION: 1. Largely resolved left pleural effusion, improved left lung base ventilation, and no pneumothorax following thoracentesis. 2. 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 Electronically Signed   By: Genevie Ann M.D.   On: 05/08/2022 11:26   DG Chest Port 1 View  Result Date: 05/07/2022 CLINICAL DATA:   Shortness of breath EXAM: PORTABLE CHEST 1 VIEW COMPARISON:  04/28/2022, CT 04/29/2022 FINDINGS: Right-sided central venous port tip at the right atrium. Small moderate left effusion increased compared to prior radiograph from 03/11. Diffuse interstitial somewhat nodular process. Consolidation left lung base. Stable cardiomediastinal silhouette. No pneumothorax IMPRESSION: Small to moderate left pleural effusion, increased compared to prior radiograph. Diffuse interstitial somewhat nodular process suspected to be secondary to diffuse lymphangitic spread of tumor on recent CT, though there may be a superimposed component of edema or infection. There is interval consolidation of left base. Electronically Signed   By: Donavan Foil M.D.   On: 05/07/2022 16:07   CT CHEST ABDOMEN PELVIS W CONTRAST  Result Date: 05/01/2022 CLINICAL DATA:  Metastatic breast cancer restaging * Tracking Code: BO * EXAM: CT CHEST, ABDOMEN, AND PELVIS WITH CONTRAST TECHNIQUE: Multidetector CT imaging of the chest, abdomen and pelvis was performed following the standard protocol during bolus administration of intravenous contrast. RADIATION DOSE REDUCTION: This exam was performed according to the departmental dose-optimization program which includes automated exposure control, adjustment of the mA and/or kV according  to patient size and/or use of iterative reconstruction technique. CONTRAST:  129mL OMNIPAQUE IOHEXOL 300 MG/ML  SOLN COMPARISON:  01/27/2022 FINDINGS: CT CHEST FINDINGS Cardiovascular: Right chest port catheter. Normal heart size. No pericardial effusion. Mediastinum/Nodes: Interval enlargement bilateral axillary and sub pectoral lymph nodes and soft tissue nodules, largest left axillary node measuring 1.8 x 1.1 cm, previously 1.1 x 0.9 cm (series 2, image 16). Numerous prominent and enlarged mediastinal and bilateral hilar lymph nodes, some of which are also enlarged, for example a paraesophageal node measuring 1.3 x 0.9 cm,  previously subcentimeter (series 2, image 38). Thyroid gland, trachea, and esophagus demonstrate no significant findings. Lungs/Pleura: New, moderate left pleural effusion with extensive pleural thickening and nodularity, pleural nodule of the medial left lung base measuring 1.4 x 0.8 cm (series 2, image 54). Very significant increase in diffuse bilateral interlobular septal thickening and lymphangitic nodularity with diffusely scattered ground-glass airspace opacity throughout the lungs. Diffuse bilateral bronchial wall thickening. Musculoskeletal: Redemonstrated large mass of the left breast with overlying skin thickening (series 2, image 30). No acute osseous findings. CT ABDOMEN PELVIS FINDINGS Hepatobiliary: Numerous new, generally subtle and ill-defined hypodense liver lesions, for example in the anterior right lobe of the liver measuring 1.6 x 1.5 cm (series 2, image 53). No gallstones, gallbladder wall thickening, or biliary dilatation. Pancreas: Unremarkable. No pancreatic ductal dilatation or surrounding inflammatory changes. Spleen: Normal in size without significant abnormality. Adrenals/Urinary Tract: Adrenal glands are unremarkable. Kidneys are normal, without renal calculi, solid lesion, or hydronephrosis. Bladder is unremarkable. Stomach/Bowel: Stomach is within normal limits. Appendix appears normal. No evidence of bowel wall thickening, distention, or inflammatory changes. Vascular/Lymphatic: No significant vascular findings are present. No enlarged abdominal or pelvic lymph nodes. Reproductive: No mass or other abnormality. Other: No abdominal wall hernia or abnormality. No ascites. Musculoskeletal: No acute osseous findings. No significant change in widespread sclerotic osseous metastatic disease throughout all included portions of the axial skeleton, including a pathologic wedge deformity of L3 and a pathologic inferior endplate deformity of L5 (series 5, image 86). IMPRESSION: 1. Interval  enlargement of bilateral axillary and sub pectoral lymph nodes and soft tissue nodules. 2. New, moderate left pleural effusion with extensive pleural thickening and nodularity. 3. Very significant increase in diffuse bilateral interlobular septal thickening and lymphangitic nodularity throughout the lungs with diffusely scattered ground-glass airspace opacity. 4. Numerous new, hypodense metastatic liver lesions. 5. No significant change in widespread sclerotic osseous metastatic disease throughout all included portions of the axial skeleton, including a pathologic wedge deformity of L3 and a pathologic inferior endplate deformity of L5. 6. Constellation of findings is consistent with significant interval worsening of widespread metastatic disease. Electronically Signed   By: Delanna Ahmadi M.D.   On: 05/01/2022 09:26   DG Chest 2 View  Result Date: 04/28/2022 CLINICAL DATA:  Cough and wheezing, metastatic breast cancer EXAM: CHEST - 2 VIEW COMPARISON:  03/21/2022 FINDINGS: Right IJ power port catheter tip SVC RA junction as before. Normal heart size and central vascularity. Similar pattern of diffuse reticulonodular interstitial opacities throughout both lungs. This remains nonspecific for diffuse pulmonary lymphangitic spread of tumor versus infectious/inflammatory process, or pulmonary edema. No large effusion or pneumothorax. Trachea midline. No acute osseous finding. IMPRESSION: Similar pattern of diffuse reticulonodular interstitial opacities throughout both lungs as above. No interval change. Electronically Signed   By: Jerilynn Mages.  Shick M.D.   On: 04/28/2022 12:14     Subjective: No acute issues or events overnight, respiratory status markedly improving, denies nausea vomiting  diarrhea constipation headache fevers chills or chest pain   Discharge Exam: Vitals:   05/09/22 0804 05/09/22 1216  BP:  119/80  Pulse:  (!) 123  Resp:  20  Temp:  98.4 F (36.9 C)  SpO2: 91% 96%   Vitals:   05/08/22 2346  05/09/22 0547 05/09/22 0804 05/09/22 1216  BP: 117/77 137/80  119/80  Pulse: (!) 108 (!) 110  (!) 123  Resp: 16 18  20   Temp: 98.3 F (36.8 C) 98.3 F (36.8 C)  98.4 F (36.9 C)  TempSrc: Oral Oral  Oral  SpO2: 95% 98% 91% 96%  Weight:      Height:        General: Pt is alert, awake, not in acute distress Cardiovascular: RRR, S1/S2 +, no rubs, no gallops Respiratory: CTA bilaterally, no wheezing, no rhonchi Abdominal: Soft, NT, ND, bowel sounds + Extremities: no edema, no cyanosis    The results of significant diagnostics from this hospitalization (including imaging, microbiology, ancillary and laboratory) are listed below for reference.     Microbiology: Recent Results (from the past 240 hour(s))  Resp panel by RT-PCR (RSV, Flu A&B, Covid) Anterior Nasal Swab     Status: None   Collection Time: 05/07/22  3:39 PM   Specimen: Anterior Nasal Swab  Result Value Ref Range Status   SARS Coronavirus 2 by RT PCR NEGATIVE NEGATIVE Final    Comment: (NOTE) SARS-CoV-2 target nucleic acids are NOT DETECTED.  The SARS-CoV-2 RNA is generally detectable in upper respiratory specimens during the acute phase of infection. The lowest concentration of SARS-CoV-2 viral copies this assay can detect is 138 copies/mL. A negative result does not preclude SARS-Cov-2 infection and should not be used as the sole basis for treatment or other patient management decisions. A negative result may occur with  improper specimen collection/handling, submission of specimen other than nasopharyngeal swab, presence of viral mutation(s) within the areas targeted by this assay, and inadequate number of viral copies(<138 copies/mL). A negative result must be combined with clinical observations, patient history, and epidemiological information. The expected result is Negative.  Fact Sheet for Patients:  EntrepreneurPulse.com.au  Fact Sheet for Healthcare Providers:   IncredibleEmployment.be  This test is no t yet approved or cleared by the [place] FDA and  has been authorized for detection and/or diagnosis of SARS-CoV-2 by FDA under an Emergency Use Authorization (EUA). This EUA will remain  in effect (meaning this test can be used) for the duration of the COVID-19 declaration under Section 564(b)(1) of the Act, 21 U.S.C.section 360bbb-3(b)(1), unless the authorization is terminated  or revoked sooner.       Influenza A by PCR NEGATIVE NEGATIVE Final   Influenza B by PCR NEGATIVE NEGATIVE Final    Comment: (NOTE) The Xpert Xpress SARS-CoV-2/FLU/RSV plus assay is intended as an aid in the diagnosis of influenza from Nasopharyngeal swab specimens and should not be used as a sole basis for treatment. Nasal washings and aspirates are unacceptable for Xpert Xpress SARS-CoV-2/FLU/RSV testing.  Fact Sheet for Patients: EntrepreneurPulse.com.au  Fact Sheet for Healthcare Providers: IncredibleEmployment.be  This test is not yet approved or cleared by the [place] FDA and has been authorized for detection and/or diagnosis of SARS-CoV-2 by FDA under an Emergency Use Authorization (EUA). This EUA will remain in effect (meaning this test can be used) for the duration of the COVID-19 declaration under Section 564(b)(1) of the Act, 21 U.S.C. section 360bbb-3(b)(1), unless the authorization is terminated or revoked.  Resp Syncytial Virus by PCR NEGATIVE NEGATIVE Final    Comment: (NOTE) Fact Sheet for Patients: EntrepreneurPulse.com.au  Fact Sheet for Healthcare Providers: IncredibleEmployment.be  This test is not yet approved or cleared by the [place] FDA and has been authorized for detection and/or diagnosis of SARS-CoV-2 by FDA under an Emergency Use Authorization (EUA). This EUA will remain in effect (meaning this test can be used) for  the duration of the COVID-19 declaration under Section 564(b)(1) of the Act, 21 U.S.C. section 360bbb-3(b)(1), unless the authorization is terminated or revoked.  Performed at [facility], [address] [place], [place] [address]   Body fluid culture w Gram Stain     Status: None (Preliminary result)   Collection Time: 05/08/22 11:13 AM   Specimen: PATH Cytology Pleural fluid  Result Value Ref Range Status   Specimen Description   Final    PLEURAL Performed at [facility] [address], [place], [place] [address]    Special Requests   Final    NONE Performed at [facility], [place] [address] [place], [place] [address]    Gram Stain   Final    RARE WBC PRESENT, PREDOMINANTLY MONONUCLEAR NO ORGANISMS SEEN    Culture   Final    NO GROWTH < 24 HOURS Performed at [facility], [place] [address], [place], [place] [address]    Report Status PENDING  Incomplete     Labs: BNP (last 3 results) Recent Labs    04/28/22 1024  BNP [id number]   Basic Metabolic Panel: Recent Labs  Lab 05/07/22 1550 05/08/22 0523  NA 138 135  K 4.2 4.4  CL 105 105  CO2 23 23  GLUCOSE 86 81  BUN 6 8  CREATININE 0.65 0.65  CALCIUM 9.2 8.0*   Liver Function Tests: Recent Labs  Lab 05/07/22 1550  AST 133*  ALT 98*  ALKPHOS 162*  BILITOT 0.4  PROT 6.6  ALBUMIN 3.7   Recent Labs  Lab 05/07/22 1550  LIPASE <10*   CBC: Recent Labs  Lab 05/07/22 1550 05/08/22 0523  WBC 11.4* 10.1  HGB 13.4 12.2  HCT 41.6 38.8  MCV 89.3 92.6  PLT 320 280   Urinalysis    Component Value Date/Time   COLORURINE AMBER (A) 09/09/2020 2051   APPEARANCEUR HAZY (A) 09/09/2020 2051   LABSPEC 1.016 09/09/2020 2051   PHURINE 6.0 09/09/2020 2051   GLUCOSEU NEGATIVE 09/09/2020 2051   HGBUR MODERATE (A) 09/09/2020 2051   BILIRUBINUR NEGATIVE 09/09/2020 2051   BILIRUBINUR neg 10/31/2011 0945   KETONESUR 20 (A) 09/09/2020 2051   PROTEINUR 30  (A) 09/09/2020 2051   UROBILINOGEN 0.2 01/08/2014 1757   NITRITE NEGATIVE 09/09/2020 2051   LEUKOCYTESUR NEGATIVE 09/09/2020 2051   Sepsis Labs Recent Labs  Lab 05/07/22 1550 05/08/22 0523  WBC 11.4* 10.1   Microbiology Recent Results (from the past 240 hour(s))  Resp panel by RT-PCR (RSV, Flu A&B, Covid) Anterior Nasal Swab     Status: None   Collection Time: 05/07/22  3:39 PM   Specimen: Anterior Nasal Swab  Result Value Ref Range Status   SARS Coronavirus 2 by RT PCR NEGATIVE NEGATIVE Final    Comment: (NOTE) SARS-CoV-2 target nucleic acids are NOT DETECTED.  The SARS-CoV-2 RNA is generally detectable in upper respiratory specimens during the acute phase of infection. The lowest concentration of SARS-CoV-2 viral copies this assay can detect is 138 copies/mL. A negative result does not preclude SARS-Cov-2  infection and should not be used as the sole basis for treatment or other patient management decisions. A negative result may occur with  improper specimen collection/handling, submission of specimen other than nasopharyngeal swab, presence of viral mutation(s) within the areas targeted by this assay, and inadequate number of viral copies(<138 copies/mL). A negative result must be combined with clinical observations, patient history, and epidemiological information. The expected result is Negative.  Fact Sheet for Patients:  EntrepreneurPulse.com.au  Fact Sheet for Healthcare Providers:  IncredibleEmployment.be  This test is no t yet approved or cleared by the [place] FDA and  has been authorized for detection and/or diagnosis of SARS-CoV-2 by FDA under an Emergency Use Authorization (EUA). This EUA will remain  in effect (meaning this test can be used) for the duration of the COVID-19 declaration under Section 564(b)(1) of the Act, 21 U.S.C.section 360bbb-3(b)(1), unless the authorization is terminated  or revoked sooner.        Influenza A by PCR NEGATIVE NEGATIVE Final   Influenza B by PCR NEGATIVE NEGATIVE Final    Comment: (NOTE) The Xpert Xpress SARS-CoV-2/FLU/RSV plus assay is intended as an aid in the diagnosis of influenza from Nasopharyngeal swab specimens and should not be used as a sole basis for treatment. Nasal washings and aspirates are unacceptable for Xpert Xpress SARS-CoV-2/FLU/RSV testing.  Fact Sheet for Patients: EntrepreneurPulse.com.au  Fact Sheet for Healthcare Providers: IncredibleEmployment.be  This test is not yet approved or cleared by the [place] FDA and has been authorized for detection and/or diagnosis of SARS-CoV-2 by FDA under an Emergency Use Authorization (EUA). This EUA will remain in effect (meaning this test can be used) for the duration of the COVID-19 declaration under Section 564(b)(1) of the Act, 21 U.S.C. section 360bbb-3(b)(1), unless the authorization is terminated or revoked.     Resp Syncytial Virus by PCR NEGATIVE NEGATIVE Final    Comment: (NOTE) Fact Sheet for Patients: EntrepreneurPulse.com.au  Fact Sheet for Healthcare Providers: IncredibleEmployment.be  This test is not yet approved or cleared by the [place] FDA and has been authorized for detection and/or diagnosis of SARS-CoV-2 by FDA under an Emergency Use Authorization (EUA). This EUA will remain in effect (meaning this test can be used) for the duration of the COVID-19 declaration under Section 564(b)(1) of the Act, 21 U.S.C. section 360bbb-3(b)(1), unless the authorization is terminated or revoked.  Performed at [facility], [address], [place], [place] [address]   Body fluid culture w Gram Stain     Status: None (Preliminary result)   Collection Time: 05/08/22 11:13 AM   Specimen: PATH Cytology Pleural fluid  Result Value Ref Range Status   Specimen Description   Final     PLEURAL Performed at [facility] [address] [place], [place] [address]    Special Requests   Final    NONE Performed at [facility], [place] [address], [place], [place] [address]    Gram Stain   Final    RARE WBC PRESENT, PREDOMINANTLY MONONUCLEAR NO ORGANISMS SEEN    Culture   Final    NO GROWTH < 24 HOURS Performed at [facility], [place] [address], [place], [place] [address]    Report Status PENDING  Incomplete     Time coordinating discharge: Over 30 minutes  SIGNED:   Little Ishikawa, DO [facility] 05/09/2022, 1:22 PM Pager   If 7PM-7AM, please contact night-coverage [email or web address]

## 2022-05-12 ENCOUNTER — Other Ambulatory Visit: Payer: Self-pay

## 2022-05-12 ENCOUNTER — Emergency Department (HOSPITAL_BASED_OUTPATIENT_CLINIC_OR_DEPARTMENT_OTHER): Payer: Medicaid Other | Admitting: Radiology

## 2022-05-12 ENCOUNTER — Ambulatory Visit: Payer: Medicaid Other

## 2022-05-12 ENCOUNTER — Emergency Department (HOSPITAL_BASED_OUTPATIENT_CLINIC_OR_DEPARTMENT_OTHER): Payer: Medicaid Other

## 2022-05-12 ENCOUNTER — Other Ambulatory Visit (HOSPITAL_BASED_OUTPATIENT_CLINIC_OR_DEPARTMENT_OTHER): Payer: Self-pay

## 2022-05-12 ENCOUNTER — Encounter (HOSPITAL_BASED_OUTPATIENT_CLINIC_OR_DEPARTMENT_OTHER): Payer: Self-pay | Admitting: Emergency Medicine

## 2022-05-12 ENCOUNTER — Inpatient Hospital Stay (HOSPITAL_BASED_OUTPATIENT_CLINIC_OR_DEPARTMENT_OTHER)
Admission: EM | Admit: 2022-05-12 | Discharge: 2022-05-19 | DRG: 175 | Disposition: E | Payer: Medicaid Other | Attending: Internal Medicine | Admitting: Internal Medicine

## 2022-05-12 ENCOUNTER — Other Ambulatory Visit: Payer: Self-pay | Admitting: Adult Health

## 2022-05-12 DIAGNOSIS — Z833 Family history of diabetes mellitus: Secondary | ICD-10-CM

## 2022-05-12 DIAGNOSIS — R1013 Epigastric pain: Secondary | ICD-10-CM | POA: Diagnosis present

## 2022-05-12 DIAGNOSIS — Z66 Do not resuscitate: Secondary | ICD-10-CM | POA: Diagnosis not present

## 2022-05-12 DIAGNOSIS — J9601 Acute respiratory failure with hypoxia: Secondary | ICD-10-CM

## 2022-05-12 DIAGNOSIS — Z1501 Genetic susceptibility to malignant neoplasm of breast: Secondary | ICD-10-CM | POA: Diagnosis not present

## 2022-05-12 DIAGNOSIS — F411 Generalized anxiety disorder: Secondary | ICD-10-CM | POA: Diagnosis present

## 2022-05-12 DIAGNOSIS — F32A Depression, unspecified: Secondary | ICD-10-CM | POA: Diagnosis present

## 2022-05-12 DIAGNOSIS — Z171 Estrogen receptor negative status [ER-]: Secondary | ICD-10-CM | POA: Diagnosis not present

## 2022-05-12 DIAGNOSIS — B370 Candidal stomatitis: Secondary | ICD-10-CM

## 2022-05-12 DIAGNOSIS — Z8249 Family history of ischemic heart disease and other diseases of the circulatory system: Secondary | ICD-10-CM

## 2022-05-12 DIAGNOSIS — Z7189 Other specified counseling: Secondary | ICD-10-CM

## 2022-05-12 DIAGNOSIS — Z9221 Personal history of antineoplastic chemotherapy: Secondary | ICD-10-CM

## 2022-05-12 DIAGNOSIS — Z79899 Other long term (current) drug therapy: Secondary | ICD-10-CM | POA: Diagnosis not present

## 2022-05-12 DIAGNOSIS — B009 Herpesviral infection, unspecified: Secondary | ICD-10-CM | POA: Diagnosis present

## 2022-05-12 DIAGNOSIS — Z923 Personal history of irradiation: Secondary | ICD-10-CM

## 2022-05-12 DIAGNOSIS — I2699 Other pulmonary embolism without acute cor pulmonale: Secondary | ICD-10-CM | POA: Diagnosis present

## 2022-05-12 DIAGNOSIS — Z515 Encounter for palliative care: Secondary | ICD-10-CM

## 2022-05-12 DIAGNOSIS — C7951 Secondary malignant neoplasm of bone: Secondary | ICD-10-CM | POA: Diagnosis present

## 2022-05-12 DIAGNOSIS — K219 Gastro-esophageal reflux disease without esophagitis: Secondary | ICD-10-CM | POA: Diagnosis present

## 2022-05-12 DIAGNOSIS — I509 Heart failure, unspecified: Secondary | ICD-10-CM | POA: Diagnosis not present

## 2022-05-12 DIAGNOSIS — J45909 Unspecified asthma, uncomplicated: Secondary | ICD-10-CM | POA: Diagnosis present

## 2022-05-12 DIAGNOSIS — Z881 Allergy status to other antibiotic agents status: Secondary | ICD-10-CM

## 2022-05-12 DIAGNOSIS — C782 Secondary malignant neoplasm of pleura: Secondary | ICD-10-CM | POA: Diagnosis present

## 2022-05-12 DIAGNOSIS — C787 Secondary malignant neoplasm of liver and intrahepatic bile duct: Secondary | ICD-10-CM | POA: Diagnosis present

## 2022-05-12 DIAGNOSIS — F909 Attention-deficit hyperactivity disorder, unspecified type: Secondary | ICD-10-CM | POA: Diagnosis present

## 2022-05-12 DIAGNOSIS — C50919 Malignant neoplasm of unspecified site of unspecified female breast: Secondary | ICD-10-CM

## 2022-05-12 DIAGNOSIS — J910 Malignant pleural effusion: Secondary | ICD-10-CM | POA: Diagnosis present

## 2022-05-12 DIAGNOSIS — Z83438 Family history of other disorder of lipoprotein metabolism and other lipidemia: Secondary | ICD-10-CM

## 2022-05-12 DIAGNOSIS — Z800 Family history of malignant neoplasm of digestive organs: Secondary | ICD-10-CM

## 2022-05-12 DIAGNOSIS — G893 Neoplasm related pain (acute) (chronic): Secondary | ICD-10-CM | POA: Diagnosis not present

## 2022-05-12 DIAGNOSIS — D649 Anemia, unspecified: Secondary | ICD-10-CM | POA: Diagnosis present

## 2022-05-12 DIAGNOSIS — C50912 Malignant neoplasm of unspecified site of left female breast: Secondary | ICD-10-CM | POA: Diagnosis present

## 2022-05-12 DIAGNOSIS — J90 Pleural effusion, not elsewhere classified: Secondary | ICD-10-CM | POA: Diagnosis not present

## 2022-05-12 DIAGNOSIS — Z7951 Long term (current) use of inhaled steroids: Secondary | ICD-10-CM

## 2022-05-12 DIAGNOSIS — Z818 Family history of other mental and behavioral disorders: Secondary | ICD-10-CM

## 2022-05-12 DIAGNOSIS — Z510 Encounter for antineoplastic radiation therapy: Secondary | ICD-10-CM

## 2022-05-12 DIAGNOSIS — R000 Tachycardia, unspecified: Secondary | ICD-10-CM | POA: Diagnosis present

## 2022-05-12 DIAGNOSIS — Z808 Family history of malignant neoplasm of other organs or systems: Secondary | ICD-10-CM

## 2022-05-12 LAB — CBC WITH DIFFERENTIAL/PLATELET
Abs Immature Granulocytes: 0.05 10*3/uL (ref 0.00–0.07)
Basophils Absolute: 0 10*3/uL (ref 0.0–0.1)
Basophils Relative: 0 %
Eosinophils Absolute: 0 10*3/uL (ref 0.0–0.5)
Eosinophils Relative: 0 %
HCT: 38.6 % (ref 36.0–46.0)
Hemoglobin: 12.6 g/dL (ref 12.0–15.0)
Immature Granulocytes: 0 %
Lymphocytes Relative: 7 %
Lymphs Abs: 0.9 10*3/uL (ref 0.7–4.0)
MCH: 28.6 pg (ref 26.0–34.0)
MCHC: 32.6 g/dL (ref 30.0–36.0)
MCV: 87.7 fL (ref 80.0–100.0)
Monocytes Absolute: 1.3 10*3/uL — ABNORMAL HIGH (ref 0.1–1.0)
Monocytes Relative: 10 %
Neutro Abs: 10.6 10*3/uL — ABNORMAL HIGH (ref 1.7–7.7)
Neutrophils Relative %: 83 %
Platelets: 315 10*3/uL (ref 150–400)
RBC: 4.4 MIL/uL (ref 3.87–5.11)
RDW: 14.9 % (ref 11.5–15.5)
WBC: 13 10*3/uL — ABNORMAL HIGH (ref 4.0–10.5)
nRBC: 0 % (ref 0.0–0.2)

## 2022-05-12 LAB — BODY FLUID CULTURE W GRAM STAIN: Culture: NO GROWTH

## 2022-05-12 LAB — BASIC METABOLIC PANEL
Anion gap: 6 (ref 5–15)
BUN: 7 mg/dL (ref 6–20)
CO2: 29 mmol/L (ref 22–32)
Calcium: 8.5 mg/dL — ABNORMAL LOW (ref 8.9–10.3)
Chloride: 98 mmol/L (ref 98–111)
Creatinine, Ser: 0.48 mg/dL (ref 0.44–1.00)
GFR, Estimated: >60 mL/min (ref >60)
Glucose, Bld: 106 mg/dL — ABNORMAL HIGH (ref 70–99)
Potassium: 3.8 mmol/L (ref 3.5–5.1)
Sodium: 133 mmol/L — ABNORMAL LOW (ref 135–145)

## 2022-05-12 LAB — HEPARIN LEVEL (UNFRACTIONATED): Heparin Unfractionated: 0.63 IU/mL (ref 0.30–0.70)

## 2022-05-12 LAB — MRSA NEXT GEN BY PCR, NASAL: MRSA by PCR Next Gen: NOT DETECTED

## 2022-05-12 MED ORDER — ACETAMINOPHEN 325 MG PO TABS: 650.0000 mg | ORAL_TABLET | Freq: Four times a day (QID) | ORAL | Status: DC | PRN

## 2022-05-12 MED ORDER — HEPARIN BOLUS VIA INFUSION
4000.0000 [IU] | Freq: Once | INTRAVENOUS | Status: AC
  Administered 2022-05-12: 4000 [IU] via INTRAVENOUS

## 2022-05-12 MED ORDER — ONDANSETRON HCL 4 MG/2ML IJ SOLN
4.0000 mg | Freq: Once | INTRAMUSCULAR | Status: AC
  Administered 2022-05-12: 4 mg via INTRAVENOUS
  Filled 2022-05-12: qty 2

## 2022-05-12 MED ORDER — ONDANSETRON HCL 4 MG/2ML IJ SOLN
4.0000 mg | Freq: Once | INTRAMUSCULAR | Status: AC
  Administered 2022-05-12: 4 mg via INTRAVENOUS

## 2022-05-12 MED ORDER — PROCHLORPERAZINE MALEATE 10 MG PO TABS: 10.0000 mg | ORAL_TABLET | Freq: Four times a day (QID) | ORAL | Status: DC | PRN

## 2022-05-12 MED ORDER — GABAPENTIN 300 MG PO CAPS
300.0000 mg | ORAL_CAPSULE | Freq: Three times a day (TID) | ORAL | Status: DC
  Administered 2022-05-12 – 2022-05-15 (×11): 300 mg via ORAL
  Filled 2022-05-12 (×11): qty 1

## 2022-05-12 MED ORDER — IOHEXOL 350 MG/ML SOLN
100.0000 mL | Freq: Once | INTRAVENOUS | Status: AC | PRN
  Administered 2022-05-12: 60 mL via INTRAVENOUS

## 2022-05-12 MED ORDER — PROCHLORPERAZINE EDISYLATE 10 MG/2ML IJ SOLN
5.0000 mg | Freq: Four times a day (QID) | INTRAMUSCULAR | Status: AC | PRN
  Administered 2022-05-12 – 2022-05-13 (×2): 5 mg via INTRAVENOUS
  Filled 2022-05-12 (×2): qty 2

## 2022-05-12 MED ORDER — OXYCODONE-ACETAMINOPHEN 5-325 MG PO TABS: 1.0000 | ORAL_TABLET | ORAL | Status: DC | PRN

## 2022-05-12 MED ORDER — SIMETHICONE 80 MG PO CHEW
80.0000 mg | CHEWABLE_TABLET | Freq: Once | ORAL | Status: AC | PRN
  Administered 2022-05-12: 80 mg via ORAL
  Filled 2022-05-12: qty 1

## 2022-05-12 MED ORDER — HEPARIN (PORCINE) 25000 UT/250ML-% IV SOLN
1100.0000 [IU]/h | INTRAVENOUS | Status: DC
  Administered 2022-05-12 – 2022-05-14 (×3): 1100 [IU]/h via INTRAVENOUS
  Filled 2022-05-12 (×3): qty 250

## 2022-05-12 MED ORDER — ALBUTEROL SULFATE (2.5 MG/3ML) 0.083% IN NEBU
2.5000 mg | INHALATION_SOLUTION | Freq: Four times a day (QID) | RESPIRATORY_TRACT | Status: DC | PRN
  Administered 2022-05-12 – 2022-05-14 (×3): 2.5 mg via RESPIRATORY_TRACT
  Filled 2022-05-12 (×3): qty 3

## 2022-05-12 MED ORDER — SODIUM CHLORIDE 0.9 % IV SOLN
25.0000 mg | Freq: Three times a day (TID) | INTRAVENOUS | Status: DC | PRN
  Administered 2022-05-12 – 2022-05-15 (×7): 25 mg via INTRAVENOUS
  Filled 2022-05-12 (×7): qty 25

## 2022-05-12 MED ORDER — ONDANSETRON HCL 4 MG/2ML IJ SOLN
4.0000 mg | Freq: Four times a day (QID) | INTRAMUSCULAR | Status: DC | PRN
  Administered 2022-05-12 – 2022-05-13 (×4): 4 mg via INTRAVENOUS
  Filled 2022-05-12 (×4): qty 2

## 2022-05-12 MED ORDER — ACETAMINOPHEN 650 MG RE SUPP: 650.0000 mg | Freq: Four times a day (QID) | RECTAL | Status: DC | PRN

## 2022-05-12 MED ORDER — PANTOPRAZOLE SODIUM 40 MG PO TBEC
40.0000 mg | DELAYED_RELEASE_TABLET | Freq: Every day | ORAL | Status: DC
  Administered 2022-05-12 – 2022-05-13 (×2): 40 mg via ORAL
  Filled 2022-05-12 (×2): qty 1

## 2022-05-12 MED ORDER — LIDOCAINE-PRILOCAINE 2.5-2.5 % EX CREA: 1.0000 | TOPICAL_CREAM | CUTANEOUS | 0 refills | Status: DC | PRN

## 2022-05-12 MED ORDER — FUROSEMIDE 10 MG/ML IJ SOLN
40.0000 mg | Freq: Once | INTRAMUSCULAR | Status: AC
  Administered 2022-05-12: 40 mg via INTRAVENOUS
  Filled 2022-05-12: qty 4

## 2022-05-12 MED ORDER — LORAZEPAM 1 MG PO TABS
1.0000 mg | ORAL_TABLET | Freq: Two times a day (BID) | ORAL | Status: DC | PRN
  Administered 2022-05-14: 1 mg via ORAL
  Filled 2022-05-12: qty 1

## 2022-05-12 MED ORDER — DIPHENOXYLATE-ATROPINE 2.5-0.025 MG PO TABS: 1.0000 | ORAL_TABLET | Freq: Four times a day (QID) | ORAL | Status: DC | PRN

## 2022-05-12 MED ORDER — ENSURE ENLIVE PO LIQD
237.0000 mL | Freq: Two times a day (BID) | ORAL | Status: DC
  Administered 2022-05-14 – 2022-05-15 (×4): 237 mL via ORAL

## 2022-05-12 MED ORDER — HYDROMORPHONE HCL 1 MG/ML IJ SOLN
1.0000 mg | Freq: Once | INTRAMUSCULAR | Status: AC
  Administered 2022-05-12: 1 mg via INTRAVENOUS
  Filled 2022-05-12: qty 1

## 2022-05-12 MED ORDER — HYDROMORPHONE HCL 1 MG/ML IJ SOLN
1.0000 mg | INTRAMUSCULAR | Status: DC | PRN
  Administered 2022-05-12 – 2022-05-13 (×2): 1 mg via INTRAVENOUS
  Filled 2022-05-12 (×2): qty 1

## 2022-05-12 MED ORDER — ONDANSETRON HCL 4 MG/2ML IJ SOLN
INTRAMUSCULAR | Status: AC
  Filled 2022-05-12: qty 2

## 2022-05-12 MED ORDER — ONDANSETRON HCL 4 MG PO TABS: 4.0000 mg | ORAL_TABLET | Freq: Four times a day (QID) | ORAL | Status: DC | PRN

## 2022-05-12 MED ORDER — VENLAFAXINE HCL ER 75 MG PO CP24
225.0000 mg | ORAL_CAPSULE | Freq: Every day | ORAL | Status: DC
  Administered 2022-05-13 – 2022-05-15 (×3): 225 mg via ORAL
  Filled 2022-05-12 (×3): qty 1

## 2022-05-12 NOTE — Progress Notes (Signed)
Plan of Care Note for accepted transfer   Patient: Ellen [name] MRN: [id number]   DOA: 05/12/2022  Facility requesting transfer: [facility]  Requesting Provider: Lennice Sites, DO. Reason for transfer: Pulmonary embolism. Facility course:  Per Dr. Ronnald Nian: "Chief Complaint  Patient presents with   Shortness of Breath    [name] is a 27 y.o. female.   Patient here with shortness of breath.  She is been on 3 L of oxygen since discharge to the hospital couple days ago.  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  Undergoing radiation treatments.  She recently had fluid drained from her lung.  This is about 4 days ago but she feels severely short of breath again.  She is not able to ambulate very long distances without being short of breath fairly significantly.  She is having to increase her oxygen when she ambulates.  She continues to have pain and nausea and poor p.o. intake and overall does not feel like she is doing well at home.  She did have a bowel movement today which has helped her abdominal pain.   The history is provided by the patient."  Plan of care: The patient has a new onset PE.  There is still bilateral moderate pleural effusions.  She recently received radiation therapy so pneumonitis is a concern.  She is accepted for admission to [facility] unit, at [facility].  Author: Reubin Milan, MD 05/12/2022  Check [email or web address] for on-call coverage.  Nursing staff, Please call [place] number on Amion as soon as patient's arrival, so appropriate admitting provider can evaluate the pt.

## 2022-05-12 NOTE — H&P (Signed)
History and Physical    Patient: Ellen [name] [id number] DOB: 15-Jun-1995 DOA: 05/12/2022 DOS: the patient was seen and examined on 05/12/2022 PCP: Patient, No Pcp Per  Patient coming from: Home  Chief Complaint:  Chief Complaint  Patient presents with   Shortness of Breath   HPI: Ellen [name] is a 27 y.o. female with medical history significant of ADHD, anemia, anxiety, exercise-induced asthma, depression, GERD, herpes simplex, stage IV breast cancer with osseous metastasis who was recently admitted and discharged due to acute respiratory failure with hypoxia in the setting of left pleural malignant effusion undergoing thoracentesis and discharged home on oxygen return to the emergency department due to dyspnea.  Imaging is now showing pulmonary embolism.  Her appetite is decreased and she has been having frequent nausea.  No fever, chills or night sweats. No sore throat, rhinorrhea, wheezing or hemoptysis.  No chest pain, palpitations, diaphoresis, PND, orthopnea or pitting edema of the lower extremities.  No abdominal pain, diarrhea, constipation, melena or hematochezia.  No flank pain, dysuria, frequency or hematuria.  No polyuria, polydipsia, polyphagia or blurred vision.  Lab work: CBC showed a white count 13.0, hemoglobin 12.6 g/dL platelets 315.  BMP with sodium 133, glucose 106 and calcium 8.5.  The rest of the BMP measurements were normal.  Imaging: CTA chest showing linear pulmonary embolus extending from the distal portion of the right pulmonary artery into the lower lobe branches.  There were also moderate bilateral pleural effusions that are increased.  Heterogeneous appearance of hepatic parenchyma consistent with metastatic disease.  Increased interstitial densities throughout both lungs concerning for pulmonary edema, as well as lymphangitic nodularity concerning for possible metastatic disease.  ED course: Initial vital signs were temperature 97.7 F, pulse 124,  respirations 22, BP 134 over 95 mm of oxygen O2 sat 96% on nasal cannula at 3 LPM.  The patient was started on heparin in the emergency department.  She also received hydromorphone 1 mg IVP and ondansetron 4 mg IVP x 2.   Review of Systems: As mentioned in the history of present illness. All other systems reviewed and are negative. Past Medical History:  Diagnosis Date   ADHD (attention deficit hyperactivity disorder)    Anemia    Anxiety    Asthma    Exercise Induced   Cancer ([place])    Depression    Family history of pancreatic cancer    Family history of thyroid cancer    GERD (gastroesophageal reflux disease)    Herpes simplex    Personal history of chemotherapy 08/2020   Past Surgical History:  Procedure Laterality Date   BREAST BIOPSY Left 08/2020   PORTACATH PLACEMENT Right 08/23/2020   Procedure: INSERTION PORT-A-CATH;  Surgeon: [name], MD;  Location: [facility];  Service: General;  Laterality: Right;   ROBOTIC ASSISTED BILATERAL SALPINGO OOPHERECTOMY Bilateral 07/16/2021   Procedure: XI ROBOTIC ASSISTED BILATERAL SALPINGO OOPHORECTOMY;  Surgeon: Lafonda Mosses, MD;  Location: [facility];  Service: Gynecology;  Laterality: Bilateral;   Social History:  reports that she has never smoked. She has never used smokeless tobacco. She reports that she does not currently use alcohol. She reports current drug use. Drug: Marijuana.  Allergies  Allergen Reactions   Other Other (See Comments)    Pt states that she is allergic to the fabric adhesives; It tears her skin.   Azithromycin Hives    Family History  Problem Relation Age of Onset   Depression Mother    Hyperlipidemia  Mother    Thyroid cancer Mother 1       papillary thyroid cancer   Hypertension Father    Atrial fibrillation Father    Irritable bowel syndrome Father        also brother   ADD / ADHD Brother    Bipolar disorder Brother    Diabetes Maternal Grandfather    Heart attack Paternal  Great-grandfather 20   Pancreatic cancer Maternal Great-grandfather 88       (MGM's father)   Colon cancer Neg Hx    Breast cancer Neg Hx    Ovarian cancer Neg Hx    Endometrial cancer Neg Hx    Prostate cancer Neg Hx     Prior to Admission medications   Medication Sig Start Date End Date Taking? Authorizing Provider  lidocaine-prilocaine (EMLA) cream Apply 1 Application topically as needed. 05/12/22   Gardenia Phlegm, NP  acetaminophen (TYLENOL) 325 MG tablet Take 650 mg by mouth every 6 (six) hours as needed for mild pain.    [provider]  acyclovir (ZOVIRAX) 800 MG tablet Take 1 tablet (800 mg total) by mouth 2 (two) times daily as needed (flare up). 10/03/20   Gardenia Phlegm, NP  acyclovir ointment (ZOVIRAX) 5 % Apply 1 application  topically 4 (four) times daily as needed (outbreak). 08/02/20   [provider]  albuterol (PROVENTIL) (2.5 MG/3ML) 0.083% nebulizer solution Take 3 mLs (2.5 mg total) by nebulization every 6 (six) hours as needed for wheezing or shortness of breath. 04/28/22   Gardenia Phlegm, NP  albuterol (VENTOLIN HFA) 108 (90 Base) MCG/ACT inhaler 2 puffs inhaled every 4-6 hours PRN 03/21/22   Gardenia Phlegm, NP  Calcium Carb-Cholecalciferol 500-10 MG-MCG TABS Take by mouth.    [provider]  diphenoxylate-atropine (LOMOTIL) 2.5-0.025 MG tablet Take 1 tablet by mouth 4 (four) times daily as needed for diarrhea or loose stools. 02/26/22   Gardenia Phlegm, NP  fluticasone (FLOVENT HFA) 110 MCG/ACT inhaler Inhale 2 puffs into the lungs in the morning and at bedtime. 04/28/22   Gardenia Phlegm, NP  gabapentin (NEURONTIN) 300 MG capsule Take 1 capsule (300 mg total) by mouth 3 (three) times daily. 04/18/22   [name], [name], NP  LORazepam (ATIVAN) 1 MG tablet Take 1 tablet (1 mg total) by mouth 2 (two) times daily as needed for anxiety. 05/09/22   Little Ishikawa, MD  ondansetron  (ZOFRAN-ODT) 8 MG disintegrating tablet Dissolve 1 tablet (8 mg total) in mouth every 8 (eight) hours as needed for nausea or vomiting. 04/18/22   [name], [name], NP  oxyCODONE-acetaminophen (PERCOCET) 5-325 MG tablet Take 1 tablet by mouth every 4 (four) hours as needed for severe pain. 05/09/22 05/09/23  Little Ishikawa, MD  pantoprazole (PROTONIX) 40 MG tablet Take 1 tablet (40 mg total) by mouth daily. 10/30/21   Gardenia Phlegm, NP  prochlorperazine (COMPAZINE) 10 MG tablet Take 1 tablet (10 mg total) by mouth every 6 (six) hours as needed for nausea or vomiting. 09/20/21   Gardenia Phlegm, NP  venlafaxine XR (EFFEXOR-XR) 150 MG 24 hr capsule Take 1 capsule (150 mg total) by mouth daily with breakfast. Patient taking differently: Take 225 mg by mouth daily with breakfast. 01/07/21   [name], MD    Physical Exam: Vitals:   05/12/22 1353 05/12/22 1400 05/12/22 1500 05/12/22 1609  BP:  (!) 122/91 (!) 132/104   Pulse: (!) 121 (!) 121 (!) 132  Resp: 20 (!) 26 (!) 23   Temp:    98 F (36.7 C)  TempSrc:    Oral  SpO2: 96% 97% 95%   Weight:      Height:       Physical Exam Vitals and nursing note reviewed.  Constitutional:      General: She is awake. She is not in acute distress.    Appearance: She is well-developed and normal weight. She is ill-appearing.     Interventions: Nasal cannula in place.  HENT:     Head: Normocephalic.     Nose: No rhinorrhea.     Mouth/Throat:     Mouth: Mucous membranes are dry.  Eyes:     Pupils: Pupils are equal, round, and reactive to light.  Neck:     Vascular: No JVD.  Cardiovascular:     Rate and Rhythm: Normal rate and regular rhythm.     Heart sounds: S1 normal and S2 normal.  Pulmonary:     Effort: Pulmonary effort is normal. Tachypnea present. No accessory muscle usage.     Breath sounds: Examination of the right-lower field reveals decreased breath sounds. Examination of the left-lower field reveals  decreased breath sounds. Decreased breath sounds present. No wheezing, rhonchi or rales.  Abdominal:     General: Bowel sounds are normal. There is no distension.     Palpations: Abdomen is soft.  Musculoskeletal:     Cervical back: Neck supple.     Right lower leg: No edema.     Left lower leg: No edema.  Skin:    General: Skin is warm and dry.  Neurological:     General: No focal deficit present.     Mental Status: She is alert and oriented to person, place, and time.  Psychiatric:        Mood and Affect: Mood normal.        Behavior: Behavior normal. Behavior is cooperative.   Data Reviewed:  Results are pending, will review when available.  Assessment and Plan: Principal Problem:   Acute respiratory failure with hypoxia ([place]) In the setting of newly diagnosed:   Pulmonary embolus ([place]) Superimposed on:   Bilateral pleural effusion Observation/SDU. Continue supplemental oxygen as needed. Continue heparin infusion per pharmacy. Check lower extremity Doppler. Obtain echocardiogram. Follow-up CBC and PTT. IR has recommended Pleurx cath. Briefly discussed with oncology. Oncology agrees with Pleurx cath insertion. Will obtain pleural fluid cytology.  Active Problems:   Primary malignant neoplasm of breast with metastasis [facility]) The patient would like to be DNR. Will continue medical treatment directed at comfort.    Generalized anxiety disorder/depression Continue lorazepam 1 mg p.o. twice daily. Effexor XR 225 mg p.o. daily.     Advance Care Planning:   Code Status: Full Code   Consults: IR (Dr. Pascal Lux).  Family Communication:   Severity of Illness: The appropriate patient status for this patient is INPATIENT. Inpatient status is judged to be reasonable and necessary in order to provide the required intensity of service to ensure the patient's safety. The patient's presenting symptoms, physical exam findings, and initial radiographic and laboratory data in the  context of their chronic comorbidities is felt to place them at high risk for further clinical deterioration. Furthermore, it is not anticipated that the patient will be medically stable for discharge from the hospital within 2 midnights of admission.   * I certify that at the point of admission it is my clinical judgment that the patient will require inpatient  hospital care spanning beyond 2 midnights from the point of admission due to high intensity of service, high risk for further deterioration and high frequency of surveillance required.*  Author: Reubin Milan, MD 05/12/2022 4:15 PM  For on call review [email or web address].   This document was prepared using Dragon voice recognition software and may contain some unintended transcription errors.

## 2022-05-12 NOTE — Progress Notes (Addendum)
ANTICOAGULATION CONSULT NOTE - Follow up  Pharmacy Consult for heparin Indication: pulmonary embolus  Allergies  Allergen Reactions   Other Other (See Comments)    Pt states that she is allergic to the fabric adhesives; It tears her skin.   Azithromycin Hives    Patient Measurements: Height: 5\' 5"  (165.1 cm) Weight: 67.6 kg (149 lb) IBW/kg (Calculated) : 57 Heparin Dosing Weight: 67.6kg  Vital Signs: Temp: 98 F (36.7 C) (03/25 1609) Temp Source: Oral (03/25 1609) BP: 141/100 (03/25 1900) Pulse Rate: 121 (03/25 1900)  Labs: Recent Labs    05/12/22 1131  HGB 12.6  HCT 38.6  PLT 315  CREATININE 0.48     Estimated Creatinine Clearance: 95 mL/min (by C-G formula based on SCr of 0.48 mg/dL).   Medical History: Past Medical History:  Diagnosis Date   ADHD (attention deficit hyperactivity disorder)    Anemia    Anxiety    Asthma    Exercise Induced   Cancer ([facility])    Depression    Family history of pancreatic cancer    Family history of thyroid cancer    GERD (gastroesophageal reflux disease)    Herpes simplex    Personal history of chemotherapy 08/2020    Medications:  Infusions:   heparin 1,100 Units/hr (05/12/22 1932)   promethazine (PHENERGAN) injection (IM or IVPB) Stopped (05/12/22 1846)    Assessment: 23 yof presented to the ED with CP and SOB. Found to have a PE. Baseline CBC is WNL and she is not on anticoagulation PTA.   Goal of Therapy:  Heparin level 0.3-0.7 units/ml Monitor platelets by anticoagulation protocol: Yes   Today, 05/12/22: - Heparin Level 0.63 units/ml, therapeutic, on IV heparin at 1100 units/hr - Nurse reports no signs/symptoms of bleeding.  Plan:  - Continue Heparin gtt at 1100 units/hr  - Check a 6 hr confirmatory heparin level - Continue to monitor for signs/symptoms of bleeding. - Daily CBC  Jamarien Rodkey Pietro Cassis 05/12/2022,7:58 PM

## 2022-05-12 NOTE — ED Triage Notes (Signed)
Pt via pov from home with sob and chest pain since leaving the hospital a few days ago. Pt was sent home on 3L o2; states she has to crank it up when she moves around. Pt had drainage of pleural effusions last week, believes that it feels tight like she is filling up again. Pt alert & oriented, nad noted.

## 2022-05-12 NOTE — Progress Notes (Signed)
Humidifier bottle placed on patient's oxygen  due to dryness.

## 2022-05-12 NOTE — Progress Notes (Signed)
Patient saturation decreased to 86%  on room air just changing into gown for assessing. Patient saturation on 3L is currently 95%

## 2022-05-12 NOTE — ED Provider Notes (Signed)
[facility] Provider Note   CSN: [id number] Arrival date & time: 05/12/22  1127     History  Chief Complaint  Patient presents with   Shortness of Breath    Ellen [name] is a 27 y.o. female.  Patient here with shortness of breath.  She is been on 3 L of oxygen since discharge to the hospital couple days ago.  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  Undergoing radiation treatments.  She recently had fluid drained from her lung.  This is about 4 days ago but she feels severely short of breath again.  She is not able to ambulate very long distances without being short of breath fairly significantly.  She is having to increase her oxygen when she ambulates.  She continues to have pain and nausea and poor p.o. intake and overall does not feel like she is doing well at home.  She did have a bowel movement today which has helped her abdominal pain.  The history is provided by the patient.       Home Medications Prior to Admission medications   Medication Sig Start Date End Date Taking? Authorizing Provider  lidocaine-prilocaine (EMLA) cream Apply 1 Application topically as needed. 05/12/22   Gardenia Phlegm, NP  acetaminophen (TYLENOL) 325 MG tablet Take 650 mg by mouth every 6 (six) hours as needed for mild pain.    [provider]  acyclovir (ZOVIRAX) 800 MG tablet Take 1 tablet (800 mg total) by mouth 2 (two) times daily as needed (flare up). 10/03/20   Gardenia Phlegm, NP  acyclovir ointment (ZOVIRAX) 5 % Apply 1 application  topically 4 (four) times daily as needed (outbreak). 08/02/20   [provider]  albuterol (PROVENTIL) (2.5 MG/3ML) 0.083% nebulizer solution Take 3 mLs (2.5 mg total) by nebulization every 6 (six) hours as needed for wheezing or shortness of breath. 04/28/22   Gardenia Phlegm, NP  albuterol (VENTOLIN HFA) 108 (90 Base) MCG/ACT inhaler 2 puffs inhaled every 4-6 hours PRN  03/21/22   Gardenia Phlegm, NP  Calcium Carb-Cholecalciferol 500-10 MG-MCG TABS Take by mouth.    [provider]  diphenoxylate-atropine (LOMOTIL) 2.5-0.025 MG tablet Take 1 tablet by mouth 4 (four) times daily as needed for diarrhea or loose stools. 02/26/22   Gardenia Phlegm, NP  fluticasone (FLOVENT HFA) 110 MCG/ACT inhaler Inhale 2 puffs into the lungs in the morning and at bedtime. 04/28/22   Gardenia Phlegm, NP  gabapentin (NEURONTIN) 300 MG capsule Take 1 capsule (300 mg total) by mouth 3 (three) times daily. 04/18/22   [name], [name], NP  LORazepam (ATIVAN) 1 MG tablet Take 1 tablet (1 mg total) by mouth 2 (two) times daily as needed for anxiety. 05/09/22   Little Ishikawa, MD  ondansetron (ZOFRAN-ODT) 8 MG disintegrating tablet Dissolve 1 tablet (8 mg total) in mouth every 8 (eight) hours as needed for nausea or vomiting. 04/18/22   [name], [name], NP  oxyCODONE-acetaminophen (PERCOCET) 5-325 MG tablet Take 1 tablet by mouth every 4 (four) hours as needed for severe pain. 05/09/22 05/09/23  Little Ishikawa, MD  pantoprazole (PROTONIX) 40 MG tablet Take 1 tablet (40 mg total) by mouth daily. 10/30/21   Gardenia Phlegm, NP  prochlorperazine (COMPAZINE) 10 MG tablet Take 1 tablet (10 mg total) by mouth every 6 (six) hours as needed for nausea or vomiting. 09/20/21   Gardenia Phlegm, NP  venlafaxine XR (EFFEXOR-XR) 150  MG 24 hr capsule Take 1 capsule (150 mg total) by mouth daily with breakfast. Patient taking differently: Take 225 mg by mouth daily with breakfast. 01/07/21   [name], MD      Allergies    Other and Azithromycin    Review of Systems   Review of Systems  Physical Exam Updated Vital Signs BP (!) 140/101   Pulse (!) 119   Temp 97.7 F (36.5 C) (Oral)   Resp (!) 23   Ht 5\' 5"  (1.651 m)   Wt 67.6 kg   SpO2 95%   BMI 24.79 kg/m  Physical Exam Vitals and nursing note reviewed.  Constitutional:       General: She is not in acute distress.    Appearance: She is well-developed. She is not ill-appearing.  HENT:     Head: Normocephalic and atraumatic.     Mouth/Throat:     Mouth: Mucous membranes are moist.  Eyes:     Conjunctiva/sclera: Conjunctivae normal.     Pupils: Pupils are equal, round, and reactive to light.  Cardiovascular:     Rate and Rhythm: Normal rate and regular rhythm.     Pulses: Normal pulses.     Heart sounds: Normal heart sounds. No murmur heard. Pulmonary:     Effort: Pulmonary effort is normal. No respiratory distress.     Breath sounds: Normal breath sounds. No decreased breath sounds, wheezing, rhonchi or rales.  Abdominal:     Palpations: Abdomen is soft.     Tenderness: There is no abdominal tenderness.  Musculoskeletal:        General: No swelling. Normal range of motion.     Cervical back: Normal range of motion and neck supple.     Right lower leg: No edema.     Left lower leg: No edema.  Skin:    General: Skin is warm and dry.     Capillary Refill: Capillary refill takes less than 2 seconds.     Comments: Chronic scarring around left breast tissue   Neurological:     Mental Status: She is alert.  Psychiatric:        Mood and Affect: Mood normal.     ED Results / Procedures / Treatments   Labs (all labs ordered are listed, but only abnormal results are displayed) Labs Reviewed  CBC WITH DIFFERENTIAL/PLATELET - Abnormal; Notable for the following components:      Result Value   WBC 13.0 (*)    Neutro Abs 10.6 (*)    Monocytes Absolute 1.3 (*)    All other components within normal limits  BASIC METABOLIC PANEL - Abnormal; Notable for the following components:   Sodium 133 (*)    Glucose, Bld 106 (*)    Calcium 8.5 (*)    All other components within normal limits    EKG EKG Interpretation  Date/Time:  Monday May 12 2022 11:35:40 EDT Ventricular Rate:  126 PR Interval:  106 QRS Duration: 90 QT Interval:  298 QTC  Calculation: 432 R Axis:   82 Text Interpretation: Sinus tachycardia Ventricular premature complex Low voltage, precordial leads Borderline repolarization abnormality Confirmed by Lennice Sites ([id number]) on 05/12/2022 11:55:38 AM  Radiology CT Angio Chest PE W and/or Wo Contrast  Result Date: 05/12/2022 CLINICAL DATA:  Chest pain, shortness of breath. History of metastatic breast cancer. EXAM: CT ANGIOGRAPHY CHEST WITH CONTRAST TECHNIQUE: Multidetector CT imaging of the chest was performed using the standard protocol during bolus administration of intravenous contrast. Multiplanar CT  image reconstructions and MIPs were obtained to evaluate the vascular anatomy. RADIATION DOSE REDUCTION: This exam was performed according to the departmental dose-optimization program which includes automated exposure control, adjustment of the mA and/or kV according to patient size and/or use of iterative reconstruction technique. CONTRAST:  68mL OMNIPAQUE IOHEXOL 350 MG/ML SOLN COMPARISON:  April 29, 2022. FINDINGS: Cardiovascular: Filling defect is noted extending from distal right pulmonary artery into lower lobe branch consistent with pulmonary embolus. Normal cardiac size. No pericardial effusion. Mediastinum/Nodes: Thyroid gland is unremarkable. Esophagus is unremarkable. Continued presence of bilateral axillary and mediastinal adenopathy is noted consistent with metastatic disease as noted on prior exam. Largest lymph node measures 2.1 cm in right paratracheal region which appears to be enlarged compared to prior exam. Lungs/Pleura: No pneumothorax is noted. Moderate bilateral pleural effusions are noted. Increased interstitial densities are noted throughout both lungs concerning for pulmonary edema. Lymphangitic nodularity is also noted concerning for possible metastatic disease. Upper Abdomen: Heterogeneous appearance of hepatic parenchyma is noted suggesting metastatic disease. Musculoskeletal: Sclerotic metastases are  again noted. Review of the MIP images confirms the above findings. IMPRESSION: Linear pulmonary embolus is noted extending from distal portion of right pulmonary artery into lower lobe branches. Critical Value/emergent results were called by telephone at the time of interpretation on 05/12/2022 at 1:44 pm to provider Kathaleen Dudziak , who verbally acknowledged these results. Continued presence of bilateral axillary mediastinal adenopathy consistent with metastatic disease. Moderate bilateral pleural effusions are noted. Increased interstitial densities are noted throughout both lungs concerning for pulmonary edema, as well as lymphangitic nodularity concerning for possible metastatic disease. Heterogeneous appearance of hepatic parenchyma is noted consistent with metastatic disease. Sclerotic osseous metastases are again noted. Electronically Signed   By: Marijo Conception M.D.   On: 05/12/2022 13:44   DG Chest 2 View  Result Date: 05/12/2022 CLINICAL DATA:  Shortness of breath, chest pain EXAM: CHEST - 2 VIEW COMPARISON:  Previous studies including the examination of 05/08/2022 FINDINGS: Cardiac size is within normal limits. Central pulmonary vessels are more prominent. Patchy interstitial and alveolar densities are noted in both lungs with interval worsening. Lateral CP angles are indistinct. There is no pneumothorax. Tip of right IJ central venous catheter is seen in right atrium. IMPRESSION: Central pulmonary vessels are more prominent. There is interval increase in patchy interstitial and alveolar densities in both lungs suggesting worsening of pulmonary edema or worsening of multifocal pneumonia. Small bilateral pleural effusions. There is no pneumothorax. Electronically Signed   By: Elmer Picker M.D.   On: 05/12/2022 12:19    Procedures .Critical Care  Performed by: Lennice Sites, DO Authorized by: Lennice Sites, DO   Critical care provider statement:    Critical care time (minutes):  35    Critical care was necessary to treat or prevent imminent or life-threatening deterioration of the following conditions:  Respiratory failure (acute pulmonary embolism)   Critical care was time spent personally by me on the following activities:  Blood draw for specimens, development of treatment plan with patient or surrogate, discussions with primary provider, evaluation of patient's response to treatment, examination of patient, obtaining history from patient or surrogate, ordering and performing treatments and interventions, ordering and review of laboratory studies, ordering and review of radiographic studies, pulse oximetry, re-evaluation of patient's condition and review of old charts   I assumed direction of critical care for this patient from another provider in my specialty: no       Medications Ordered in ED Medications  albuterol (PROVENTIL) (2.5 MG/3ML) 0.083% nebulizer solution 2.5 mg (2.5 mg Nebulization Given 05/12/22 1353)  HYDROmorphone (DILAUDID) injection 1 mg (1 mg Intravenous Given 05/12/22 1237)  ondansetron (ZOFRAN) injection 4 mg (4 mg Intravenous Given 05/12/22 1239)  iohexol (OMNIPAQUE) 350 MG/ML injection 100 mL (60 mLs Intravenous Contrast Given 05/12/22 1316)    ED Course/ Medical Decision Making/ A&P                             Medical Decision Making Amount and/or Complexity of Data Reviewed Labs: ordered. Radiology: ordered.  Risk Prescription drug management. Decision regarding hospitalization.   Ellen [name] is here with shortness of breath.  She been on 3 L of oxygen since recent pleural effusion.  She has a history of breast cancer undergoing radiation treatments.  She was just discharged after having new pleural effusion.  She was sent home on oxygen.  She states that she feels very short of breath similar to when she was admitted a couple days ago.  She has diminished breath sounds on the left compared to the right.  No leg swelling.  She had  extensive workup a few days ago when she was admitted.  Did not get a PET scan which I think might be reasonable although I suspect that this is just a recurrent pleural effusion likely in setting of her cancerous process.  It appears that she has likely metastasis to her liver and spine.  I cannot see any cytology from pleural fluid in the past.  Ultimately I suspect we will need to admit for another thoracentesis and likely more long-term solution given how quickly the fluid likely has built up.  Will likely get a CT PE scan to further evaluate.  EKG shows sinus tachycardia per my review and interpretation.  There is no ischemic changes.  Cardiac workup recently was unremarkable.  Per my review and interpretation of labs no significant anemia or electrolyte abnormality or kidney review.  Radiology called me on the phone and patient does have linear pulmonary embolism extending from the distal portion of the right pulmonary artery into the lower lobe branches.  No obvious right heart strain.  There continues to be bilateral axillary mediastinal adenopathy consistent with metastatic disease.  There is also moderate bilateral pleural effusions.  Increased interstitial densities are noted throughout the lungs concerning for pulmonary edema as well as lymphangitic nodularity concerning for metastatic disease.  Will be dose of IV Lasix and start IV heparin bolus and infusion per PE protocol.  Will admit to medicine for further care.  This chart was dictated using voice recognition software.  Despite best efforts to proofread,  errors can occur which can change the documentation meaning.         Final Clinical Impression(s) / ED Diagnoses Final diagnoses:  Acute respiratory failure with hypoxia ([place])  Other acute pulmonary embolism without acute cor pulmonale [facility])    Rx / DC Orders ED Discharge Orders     None         Lennice Sites, DO 05/12/22 1353

## 2022-05-12 NOTE — Progress Notes (Signed)
ANTICOAGULATION CONSULT NOTE - Initial Consult  Pharmacy Consult for heparin Indication: pulmonary embolus  Allergies  Allergen Reactions   Other Other (See Comments)    Pt states that she is allergic to the fabric adhesives; It tears her skin.   Azithromycin Hives    Patient Measurements: Height: 5\' 5"  (165.1 cm) Weight: 67.6 kg (149 lb) IBW/kg (Calculated) : 57 Heparin Dosing Weight: 67.6kg  Vital Signs: Temp: 97.7 F (36.5 C) (03/25 1134) Temp Source: Oral (03/25 1134) BP: 140/101 (03/25 1330) Pulse Rate: 121 (03/25 1353)  Labs: Recent Labs    05/12/22 1131  HGB 12.6  HCT 38.6  PLT 315  CREATININE 0.48    Estimated Creatinine Clearance: 95 mL/min (by C-G formula based on SCr of 0.48 mg/dL).   Medical History: Past Medical History:  Diagnosis Date   ADHD (attention deficit hyperactivity disorder)    Anemia    Anxiety    Asthma    Exercise Induced   Cancer (HCC)    Depression    Family history of pancreatic cancer    Family history of thyroid cancer    GERD (gastroesophageal reflux disease)    Herpes simplex    Personal history of chemotherapy 08/2020    Medications:  Infusions:   heparin      Assessment: 22 yof presented to the ED with CP and SOB. Found to have a PE. Baseline CBC is WNL and she is not on anticoagulation PTA.   Goal of Therapy:  Heparin level 0.3-0.7 units/ml Monitor platelets by anticoagulation protocol: Yes   Plan:  Heparin bolus 4000 units IV x 1 Heparin gtt 1100 units/hr Check a 6 hr heparin level Daily heparin level and CBC  Ellen [name], Rande Lawman 05/12/2022,2:00 PM

## 2022-05-12 NOTE — ED Notes (Signed)
Thomas with cl called for transport  

## 2022-05-13 ENCOUNTER — Ambulatory Visit: Payer: Medicaid Other

## 2022-05-13 ENCOUNTER — Encounter: Payer: Self-pay | Admitting: General Practice

## 2022-05-13 ENCOUNTER — Inpatient Hospital Stay (HOSPITAL_COMMUNITY): Payer: Medicaid Other

## 2022-05-13 DIAGNOSIS — Z66 Do not resuscitate: Secondary | ICD-10-CM

## 2022-05-13 DIAGNOSIS — F32A Depression, unspecified: Secondary | ICD-10-CM

## 2022-05-13 DIAGNOSIS — I2699 Other pulmonary embolism without acute cor pulmonale: Secondary | ICD-10-CM

## 2022-05-13 DIAGNOSIS — J9601 Acute respiratory failure with hypoxia: Secondary | ICD-10-CM

## 2022-05-13 DIAGNOSIS — C50919 Malignant neoplasm of unspecified site of unspecified female breast: Secondary | ICD-10-CM | POA: Diagnosis not present

## 2022-05-13 DIAGNOSIS — Z7189 Other specified counseling: Secondary | ICD-10-CM

## 2022-05-13 DIAGNOSIS — G893 Neoplasm related pain (acute) (chronic): Secondary | ICD-10-CM

## 2022-05-13 DIAGNOSIS — I509 Heart failure, unspecified: Secondary | ICD-10-CM | POA: Diagnosis not present

## 2022-05-13 DIAGNOSIS — J90 Pleural effusion, not elsewhere classified: Secondary | ICD-10-CM

## 2022-05-13 DIAGNOSIS — Z515 Encounter for palliative care: Secondary | ICD-10-CM

## 2022-05-13 LAB — BRAIN NATRIURETIC PEPTIDE: B Natriuretic Peptide: 20.5 pg/mL (ref 0.0–100.0)

## 2022-05-13 LAB — COMPREHENSIVE METABOLIC PANEL
ALT: 93 U/L — ABNORMAL HIGH (ref 0–44)
AST: 210 U/L — ABNORMAL HIGH (ref 15–41)
Albumin: 2.5 g/dL — ABNORMAL LOW (ref 3.5–5.0)
Alkaline Phosphatase: 306 U/L — ABNORMAL HIGH (ref 38–126)
Anion gap: 9 (ref 5–15)
BUN: 9 mg/dL (ref 6–20)
CO2: 27 mmol/L (ref 22–32)
Calcium: 7.5 mg/dL — ABNORMAL LOW (ref 8.9–10.3)
Chloride: 94 mmol/L — ABNORMAL LOW (ref 98–111)
Creatinine, Ser: 0.66 mg/dL (ref 0.44–1.00)
GFR, Estimated: >60 mL/min (ref >60)
Glucose, Bld: 109 mg/dL — ABNORMAL HIGH (ref 70–99)
Potassium: 3.8 mmol/L (ref 3.5–5.1)
Sodium: 130 mmol/L — ABNORMAL LOW (ref 135–145)
Total Bilirubin: 1.2 mg/dL (ref 0.3–1.2)
Total Protein: 6.1 g/dL — ABNORMAL LOW (ref 6.5–8.1)

## 2022-05-13 LAB — CBC
HCT: 39 % (ref 36.0–46.0)
Hemoglobin: 12.4 g/dL (ref 12.0–15.0)
MCH: 28.7 pg (ref 26.0–34.0)
MCHC: 31.8 g/dL (ref 30.0–36.0)
MCV: 90.3 fL (ref 80.0–100.0)
Platelets: 320 10*3/uL (ref 150–400)
RBC: 4.32 MIL/uL (ref 3.87–5.11)
RDW: 15.1 % (ref 11.5–15.5)
WBC: 14.9 10*3/uL — ABNORMAL HIGH (ref 4.0–10.5)
nRBC: 0 % (ref 0.0–0.2)

## 2022-05-13 LAB — PROTIME-INR
INR: 1.3 — ABNORMAL HIGH (ref 0.8–1.2)
Prothrombin Time: 15.6 seconds — ABNORMAL HIGH (ref 11.4–15.2)

## 2022-05-13 LAB — APTT: aPTT: 133 seconds — ABNORMAL HIGH (ref 24–36)

## 2022-05-13 LAB — PROCALCITONIN: Procalcitonin: 1.36 ng/mL

## 2022-05-13 LAB — ECHOCARDIOGRAM COMPLETE
Height: 65 in
Weight: 2384 oz

## 2022-05-13 LAB — HEPARIN LEVEL (UNFRACTIONATED): Heparin Unfractionated: 0.55 IU/mL (ref 0.30–0.70)

## 2022-05-13 MED ORDER — PERFLUTREN LIPID MICROSPHERE
1.0000 mL | INTRAVENOUS | Status: AC | PRN
  Administered 2022-05-13: 2 mL via INTRAVENOUS

## 2022-05-13 MED ORDER — LEVALBUTEROL HCL 0.63 MG/3ML IN NEBU
0.6300 mg | INHALATION_SOLUTION | Freq: Four times a day (QID) | RESPIRATORY_TRACT | Status: DC
  Administered 2022-05-13 (×3): 0.63 mg via RESPIRATORY_TRACT
  Filled 2022-05-13 (×3): qty 3

## 2022-05-13 MED ORDER — CHLORHEXIDINE GLUCONATE CLOTH 2 % EX PADS
6.0000 | MEDICATED_PAD | Freq: Every day | CUTANEOUS | Status: DC
  Administered 2022-05-13 – 2022-05-15 (×3): 6 via TOPICAL

## 2022-05-13 MED ORDER — PIPERACILLIN-TAZOBACTAM 3.375 G IVPB
3.3750 g | Freq: Three times a day (TID) | INTRAVENOUS | Status: DC
  Administered 2022-05-13 – 2022-05-15 (×8): 3.375 g via INTRAVENOUS
  Filled 2022-05-13 (×8): qty 50

## 2022-05-13 MED ORDER — METHYLPREDNISOLONE SODIUM SUCC 40 MG IJ SOLR
40.0000 mg | Freq: Two times a day (BID) | INTRAMUSCULAR | Status: DC
  Administered 2022-05-13 – 2022-05-15 (×6): 40 mg via INTRAVENOUS
  Filled 2022-05-13 (×6): qty 1

## 2022-05-13 MED ORDER — PROCHLORPERAZINE EDISYLATE 10 MG/2ML IJ SOLN
5.0000 mg | Freq: Four times a day (QID) | INTRAMUSCULAR | Status: AC | PRN
  Administered 2022-05-13 – 2022-05-14 (×3): 5 mg via INTRAVENOUS
  Filled 2022-05-13 (×3): qty 2

## 2022-05-13 MED ORDER — POLYETHYLENE GLYCOL 3350 17 G PO PACK
17.0000 g | PACK | Freq: Every day | ORAL | Status: DC
  Administered 2022-05-13 – 2022-05-14 (×2): 17 g via ORAL
  Filled 2022-05-13 (×2): qty 1

## 2022-05-13 MED ORDER — HYDROCOD POLI-CHLORPHE POLI ER 10-8 MG/5ML PO SUER
5.0000 mL | Freq: Two times a day (BID) | ORAL | Status: DC | PRN
  Administered 2022-05-13: 5 mL via ORAL
  Filled 2022-05-13: qty 5

## 2022-05-13 MED ORDER — LACTATED RINGERS IV SOLN: INTRAVENOUS | Status: AC

## 2022-05-13 MED ORDER — BUDESONIDE 0.5 MG/2ML IN SUSP
0.5000 mg | Freq: Two times a day (BID) | RESPIRATORY_TRACT | Status: DC
  Administered 2022-05-13 – 2022-05-15 (×5): 0.5 mg via RESPIRATORY_TRACT
  Filled 2022-05-13 (×5): qty 2

## 2022-05-13 MED ORDER — HYDROMORPHONE HCL 1 MG/ML IJ SOLN
1.0000 mg | INTRAMUSCULAR | Status: DC | PRN
  Administered 2022-05-13 – 2022-05-14 (×10): 1 mg via INTRAVENOUS
  Filled 2022-05-13 (×10): qty 1

## 2022-05-13 MED ORDER — BUDESONIDE 0.25 MG/2ML IN SUSP
0.2500 mg | Freq: Two times a day (BID) | RESPIRATORY_TRACT | Status: DC
  Administered 2022-05-13: 0.25 mg via RESPIRATORY_TRACT
  Filled 2022-05-13: qty 2

## 2022-05-13 MED ORDER — PANTOPRAZOLE SODIUM 40 MG PO TBEC
40.0000 mg | DELAYED_RELEASE_TABLET | Freq: Two times a day (BID) | ORAL | Status: DC
  Administered 2022-05-13 – 2022-05-15 (×5): 40 mg via ORAL
  Filled 2022-05-13 (×5): qty 1

## 2022-05-13 MED ORDER — DOCUSATE SODIUM 100 MG PO CAPS
100.0000 mg | ORAL_CAPSULE | Freq: Every day | ORAL | Status: DC
  Administered 2022-05-13 – 2022-05-15 (×3): 100 mg via ORAL
  Filled 2022-05-13 (×3): qty 1

## 2022-05-13 MED ORDER — LEVALBUTEROL HCL 0.63 MG/3ML IN NEBU
0.6300 mg | INHALATION_SOLUTION | Freq: Four times a day (QID) | RESPIRATORY_TRACT | Status: DC
  Administered 2022-05-14 – 2022-05-15 (×7): 0.63 mg via RESPIRATORY_TRACT
  Filled 2022-05-13 (×7): qty 3

## 2022-05-13 NOTE — Progress Notes (Signed)
[place] Spiritual Care Note  Attempted to visit Kruti in [facility] ICU, but she was having a meeting with providers. Left handmade blessing blanket and my brochure for her RN to share later. Following for support.   [place], [place], [facility] Pager [phone] Voicemail [phone]

## 2022-05-13 NOTE — Progress Notes (Signed)
BLE venous duplex has been completed.   Results can be found under chart review under CV PROC. 05/13/2022 9:53 AM Demetress Tift RVT, RDMS

## 2022-05-13 NOTE — Procedures (Signed)
Left Pleural Space [place] Assessment        Assessed R Pleural space as well, significantly less fluid. Estimate 1cm or less pleural fluid on [place].    [name], MSN, APRN, NP-C, AGACNP-BC [facility] 05/13/2022, 3:26 PM   Please see [email or web address] for pager details.   From 7A-7P if no response, please call [phone] After hours, please call ELink [phone]

## 2022-05-13 NOTE — Progress Notes (Signed)
Pharmacy Antibiotic Note  Ellen [name] is a 27 y.o. female admitted on 05/12/2022 with shortness of breath. Patient recently discharged from the hospital; at that time, she was found to have pleural effusion and underwent thoracentesis. She returned with shortness of breath and now found to have PE, started on heparin. Pharmacy has been consulted for Zosyn dosing for CAP.  Plan: -Zosyn 3.375 g IV q8h extended infusion  Height: 5\' 5"  (165.1 cm) Weight: 67.6 kg (149 lb) IBW/kg (Calculated) : 57  Temp (24hrs), Avg:98.5 F (36.9 C), Min:97.7 F (36.5 C), Max:98.9 F (37.2 C)  Recent Labs  Lab 05/07/22 1550 05/08/22 0523 05/12/22 1131 05/13/22 0329  WBC 11.4* 10.1 13.0* 14.9*  CREATININE 0.65 0.65 0.48 0.66    Estimated Creatinine Clearance: 95 mL/min (by C-G formula based on SCr of 0.66 mg/dL).    Allergies  Allergen Reactions   Other Other (See Comments)    Pt states that she is allergic to the fabric adhesives; It tears her skin.   Azithromycin Hives    Antimicrobials this admission: Zosyn 3/26 >>  Dose adjustments this admission: NA  Microbiology results: 3/25 MRSA PCR: negative  Thank you for allowing pharmacy to be a part of this patient's care.  Tawnya Crook, PharmD, BCPS Clinical Pharmacist 05/13/2022 9:21 AM

## 2022-05-13 NOTE — Progress Notes (Signed)
ANTICOAGULATION CONSULT NOTE - Follow up  Pharmacy Consult for heparin Indication: pulmonary embolus  Allergies  Allergen Reactions   Other Other (See Comments)    Pt states that she is allergic to the fabric adhesives; It tears her skin.   Azithromycin Hives    Patient Measurements: Height: 5\' 5"  (165.1 cm) Weight: 67.6 kg (149 lb) IBW/kg (Calculated) : 57 Heparin Dosing Weight: 67.6kg  Vital Signs: Temp: 98.9 F (37.2 C) (03/26 0400) Temp Source: Oral (03/26 0400) BP: 122/91 (03/26 0400) Pulse Rate: 125 (03/26 0400)  Labs: Recent Labs    05/12/22 1131 05/12/22 2054 05/13/22 0329  HGB 12.6  --  12.4  HCT 38.6  --  39.0  PLT 315  --  320  APTT  --   --  133*  LABPROT  --   --  15.6*  INR  --   --  1.3*  HEPARINUNFRC  --  0.63 0.55  CREATININE 0.48  --   --      Estimated Creatinine Clearance: 95 mL/min (by C-G formula based on SCr of 0.48 mg/dL).   Medical History: Past Medical History:  Diagnosis Date   ADHD (attention deficit hyperactivity disorder)    Anemia    Anxiety    Asthma    Exercise Induced   Cancer (HCC)    Depression    Family history of pancreatic cancer    Family history of thyroid cancer    GERD (gastroesophageal reflux disease)    Herpes simplex    Personal history of chemotherapy 08/2020    Medications:  Infusions:   heparin 1,100 Units/hr (05/13/22 0137)   promethazine (PHENERGAN) injection (IM or IVPB) Stopped (05/13/22 0135)    Assessment: 59 yof presented to the ED with CP and SOB. Found to have a PE. Baseline CBC is WNL and she is not on anticoagulation PTA.   Today, 05/12/22: - Heparin Level 0.55 units/ml, therapeutic, on IV heparin at 1100 units/hr - Hgb 12.4, Pltc [id number] - No complications of therapy noted  Goal of Therapy:  Heparin level 0.3-0.7 units/ml Monitor platelets by anticoagulation protocol: Yes    Plan:  - Continue Heparin gtt at 1100 units/hr  - Continue to monitor for signs/symptoms of bleeding. -  Daily CBC & heparin level  Ellen [name], Ellen [name], PharmD 05/13/2022,4:27 AM

## 2022-05-13 NOTE — Progress Notes (Signed)
RT instructed patient and family on the use of a flutter valve. Patient is able to demonstrate back good technique. 

## 2022-05-13 NOTE — Consult Note (Signed)
NAME:  Ellen [name], MRN:  [id number], DOB:  03-06-1995, LOS: 1 ADMISSION DATE:  05/12/2022, CONSULTATION DATE:  3/26 REFERRING MD:  Dr. Tyrell Antonio, CHIEF COMPLAINT:  SOB   History of Present Illness:  27 year old female who presented to [facility] ER on 3/25 with reports of shortness of breath.  She unfortunately carries a history of stage IV breast cancer.  She was initially diagnosed in June 2022.  At that time she had lymph node, rib, thoracic spine, liver metastasis.  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  She underwent palliative radiation to the lumbar spine and pelvic lesions.  Her initial chemotherapy regimen was Herceptin/Perjeta and Taxotere.  She had complications of refractory diarrhea requiring hospitalization requiring therapy adjustment.  Her genetic testing was negative. She is followed by Dr. Lindi Adie in [place].   The patient was recently evaluated (05/05/2022) at [facility].  Review of the visit from [place] shows the following: The patient most recently has been on Sacituzumab but it is felt by her care team that this is no longer working.  In addition she has been on carboplatin and gemcitabine.  She has never had immunotherapy as her PD-L1 was negative.  Despite therapy the patient had noted progression of her breast tumor and thickening of the skin.  She had ongoing discomfort despite taking gabapentin.  She has also received radiation to her lower spine but not her chest wall.  In December 2023 the patient began having difficulties with breathing.  Her scans at that time showed possible mild lung involvement with patchy areas throughout.  She had a viral illness at the end of December which may have triggered an asthma exacerbation.  She was treated with her albuterol, steroid inhaler, Medrol Dosepak and Z-Pak.  The patient reported  approximately 85% improvement after completion.  Recommendations from the [facility] breast cancer clinic included performing a liver biopsy with repeat biomarker testing, cough control, and transition to a regimen that will treat both triple negative and HER2 positive clone such as HER2 CLIMB regimen or eribulin with margetuximab.    The patient is hopeful to take a trip to [place] in [place] from 4/16 through 4/30.   In December 2023, CT imaging of chest showed no effusion & parenchyma was largely unremarkable.  On April 29, 2022 CT showed significant increase in intralobular septal thickening & new moderate left pleural effusion.  After her [facility] visit she underwent a thoracentesis 3/21 with 1.8L obtained with intent to send for cell block for Her2 testing to determine next steps. She was planned for liver biopsy and repeat thoracentesis on 05/19/22.  However, the patient presented to the [facility] ER on 3/25 with reports of tightness in her upper abdomen, nausea, 1 episode of vomiting & shortness of breath.  She was evaluated with a CTA which showed a right sided pulmonary embolism and moderate bilateral pleural effusions.   She was admitted and started on heparin infusion for pulmonary embolism.   PCCM consulted for pulmonary evaluation.   Pertinent  Medical History  ADHD Anemia Depression / Anxiety  Exercise Induced Asthma  GERD Family hx of pancreatic, thyroid cancer  Significant Hospital Events: Including procedures, antibiotic start and stop dates in addition to other pertinent events   3/25 Admit 3/26 PCCM consulted   Interim History / Subjective:  Pt reports shortness of breath,  generalized pain   Objective   Blood pressure (!) 136/93, pulse (!) 128, temperature 98.7 F (37.1 C), temperature source Oral, resp. rate (!) 24, height 5\' 5"  (1.651 m), weight 67.6 kg, SpO2 94 %.    FiO2 (%):  [32 %] 32 %   Intake/Output Summary (Last 24 hours) at 05/13/2022 1142 Last data  filed at 05/13/2022 0756 Gross per 24 hour  Intake 331.57 ml  Output --  Net 331.57 ml   Filed Weights   05/12/22 1136  Weight: 67.6 kg    Examination: General: chronically ill appearing adult female lying in bed in NAD HENT: MM pink/moist, alopecia, good dentition, pupils =/reactive  Lungs: mild dyspnea at rest with speaking, able to speak full paragraphs, lungs bilaterally with wheezing Cardiovascular: s1s2 RRR, no m/r/g Abdomen: soft/non-distended, bsx4 active, tolerating PO's  Extremities: warm/dry, no edema  Neuro: AAOx4, speech clear, MAE   Resolved Hospital Problem list     Assessment & Plan:   Acute Hypoxic Respiratory Failure in setting of PE and suspected Lymphangitic Spread  Dyspnea  New O2 need on admit, right side PE from distal pulmonary artery extending into lower lobe branch.  Suspect CT findings represent lymphangitic spread of breast cancer. Less likely radiation pneumonitis given timing and only one treatment thus far to chest.  Less likely infectious process. Note elevated PCT in setting of malignancy.  -O2 to support sats >90% -will need O2 at discharge  -pulmonary hygiene  -await ECHO to evaluate for RV strain, note normal cardiac size on CT  -continue heparin infusion  -DNR/DNI  -treat symptom burden with anxiolytics, PRN pain control  -empiric abx per TRH for CAP, though less likely   Bilateral Pleural Effusions Concerning for malignant progression of disease.  S/p thora on 3/21 with 1.8L removed.  -await pleural cytology before making decision on placement of PleurX -if above negative, consider repeat thora for cytology vs empiric placement  -TOC consult to assess for coverage of PleurX catheter drainage system coverage & home care before insertion of catheter  -assess with [place] to review pleural fluid 3/26    Best Practice (right click and "Reselect all SmartList Selections" daily)  Per Primary   Labs   CBC: Recent Labs  Lab 05/07/22 1550  05/08/22 0523 05/12/22 1131 05/13/22 0329  WBC 11.4* 10.1 13.0* 14.9*  NEUTROABS  --   --  10.6*  --   HGB 13.4 12.2 12.6 12.4  HCT 41.6 38.8 38.6 39.0  MCV 89.3 92.6 87.7 90.3  PLT 320 280 315 [id number]    Basic Metabolic Panel: Recent Labs  Lab 05/07/22 1550 05/08/22 0523 05/12/22 1131 05/13/22 0329  NA 138 135 133* 130*  K 4.2 4.4 3.8 3.8  CL 105 105 98 94*  CO2 23 23 29 27   GLUCOSE 86 81 106* 109*  BUN 6 8 7 9   CREATININE 0.65 0.65 0.48 0.66  CALCIUM 9.2 8.0* 8.5* 7.5*   GFR: Estimated Creatinine Clearance: 95 mL/min (by C-G formula based on SCr of 0.66 mg/dL). Recent Labs  Lab 05/07/22 1550 05/08/22 0523 05/12/22 1131 05/13/22 0329 05/13/22 1021  PROCALCITON  --   --   --   --  1.36  WBC 11.4* 10.1 13.0* 14.9*  --     Liver Function Tests: Recent Labs  Lab 05/07/22 1550 05/13/22 0329  AST 133* 210*  ALT 98* 93*  ALKPHOS 162* 306*  BILITOT 0.4 1.2  PROT 6.6 6.1*  ALBUMIN 3.7 2.5*   Recent Labs  Lab 05/07/22 1550  LIPASE <10*   No results for input(s): "AMMONIA" in the last 168 hours.  ABG No results found for: "PHART", "PCO2ART", "PO2ART", "HCO3", "TCO2", "ACIDBASEDEF", "O2SAT"   Coagulation Profile: Recent Labs  Lab 05/13/22 0329  INR 1.3*    Cardiac Enzymes: No results for input(s): "CKTOTAL", "CKMB", "CKMBINDEX", "TROPONINI" in the last 168 hours.  HbA1C: No results found for: "HGBA1C"  CBG: No results for input(s): "GLUCAP" in the last 168 hours.  Review of Systems:   Gen: Denies fever, chills, weight change, fatigue, night sweats HEENT: Denies blurred vision, double vision, hearing loss, tinnitus, sinus congestion, rhinorrhea, sore throat, neck stiffness, dysphagia PULM: Denies shortness of breath, cough, sputum production, hemoptysis, wheezing CV: Denies chest pain, edema, orthopnea, paroxysmal nocturnal dyspnea, palpitations GI: Denies abdominal pain, nausea, vomiting, diarrhea, hematochezia, melena, constipation, change in  bowel habits GU: Denies dysuria, hematuria, polyuria, oliguria, urethral discharge Endocrine: Denies hot or cold intolerance, polyuria, polyphagia or appetite change Derm: Denies rash, dry skin, scaling or peeling skin change Heme: Denies easy bruising, bleeding, bleeding gums Neuro: Denies headache, numbness, weakness, slurred speech, loss of memory or consciousness  Past Medical History:  She,  has a past medical history of ADHD (attention deficit hyperactivity disorder), Anemia, Anxiety, Asthma, Cancer ([place]), Depression, Family history of pancreatic cancer, Family history of thyroid cancer, GERD (gastroesophageal reflux disease), Herpes simplex, and Personal history of chemotherapy (08/2020).   Surgical History:   Past Surgical History:  Procedure Laterality Date   BREAST BIOPSY Left 08/2020   PORTACATH PLACEMENT Right 08/23/2020   Procedure: INSERTION PORT-A-CATH;  Surgeon: [name], MD;  Location: [facility];  Service: General;  Laterality: Right;   ROBOTIC ASSISTED BILATERAL SALPINGO OOPHERECTOMY Bilateral 07/16/2021   Procedure: XI ROBOTIC ASSISTED BILATERAL SALPINGO OOPHORECTOMY;  Surgeon: Lafonda Mosses, MD;  Location: [facility];  Service: Gynecology;  Laterality: Bilateral;     Social History:   reports that she has never smoked. She has never used smokeless tobacco. She reports that she does not currently use alcohol. She reports current drug use. Drug: Marijuana.   Family History:  Her family history includes ADD / ADHD in her brother; Atrial fibrillation in her father; Bipolar disorder in her brother; Depression in her mother; Diabetes in her maternal grandfather; Heart attack (age of onset: 44) in her paternal great-grandfather; Hyperlipidemia in her mother; Hypertension in her father; Irritable bowel syndrome in her father; Pancreatic cancer (age of onset: 58) in her maternal great-grandfather; Thyroid cancer (age of onset: 50) in her mother. There is no  history of Colon cancer, Breast cancer, Ovarian cancer, Endometrial cancer, or Prostate cancer.   Allergies Allergies  Allergen Reactions   Other Other (See Comments)    Pt states that she is allergic to the fabric adhesives; It tears her skin.   Azithromycin Hives     Home Medications  Prior to Admission medications   Medication Sig Start Date End Date Taking? Authorizing Provider  acyclovir (ZOVIRAX) 800 MG tablet Take 1 tablet (800 mg total) by mouth 2 (two) times daily as needed (flare up). 10/03/20  Yes [name], [name], NP  acyclovir ointment (ZOVIRAX) 5 % Apply 1 application  topically 4 (four) times daily as needed (outbreak). 08/02/20  Yes [provider]  albuterol (PROVENTIL) (2.5 MG/3ML) 0.083% nebulizer solution Take 3 mLs (2.5 mg total) by nebulization every 6 (six) hours as needed for wheezing or shortness of breath. 04/28/22  Yes [name], [name], NP  albuterol (VENTOLIN  HFA) 108 (90 Base) MCG/ACT inhaler 2 puffs inhaled every 4-6 hours PRN Patient taking differently: Inhale 2 puffs into the lungs every 4 (four) hours as needed for wheezing or shortness of breath. 03/21/22  Yes [name], [name], NP  diphenoxylate-atropine (LOMOTIL) 2.5-0.025 MG tablet Take 1 tablet by mouth 4 (four) times daily as needed for diarrhea or loose stools. 02/26/22  Yes [name], [name], NP  fluticasone (FLOVENT HFA) 110 MCG/ACT inhaler Inhale 2 puffs into the lungs in the morning and at bedtime. 04/28/22  Yes [name], [name], NP  gabapentin (NEURONTIN) 300 MG capsule Take 1 capsule (300 mg total) by mouth 3 (three) times daily. 04/18/22  Yes [name], [name], NP  lidocaine-prilocaine (EMLA) cream Apply 1 Application topically as needed. Patient taking differently: Apply 1 Application topically as directed. 05/12/22  Yes [name], [name], NP  LORazepam (ATIVAN) 1 MG tablet Take 1 tablet (1 mg total) by mouth 2 (two) times daily as needed for  anxiety. 05/09/22  Yes Little Ishikawa, MD  ondansetron (ZOFRAN-ODT) 8 MG disintegrating tablet Dissolve 1 tablet (8 mg total) in mouth every 8 (eight) hours as needed for nausea or vomiting. 04/18/22  Yes [name], [name], NP  oxyCODONE-acetaminophen (PERCOCET) 5-325 MG tablet Take 1 tablet by mouth every 4 (four) hours as needed for severe pain. 05/09/22 05/09/23 Yes Little Ishikawa, MD  pantoprazole (PROTONIX) 40 MG tablet Take 1 tablet (40 mg total) by mouth daily. 10/30/21  Yes [name], [name], NP  prochlorperazine (COMPAZINE) 10 MG tablet Take 1 tablet (10 mg total) by mouth every 6 (six) hours as needed for nausea or vomiting. 09/20/21  Yes [name], [name], NP  venlafaxine (EFFEXOR) 75 MG tablet Take 225 mg by mouth daily.   Yes [provider]  venlafaxine XR (EFFEXOR-XR) 150 MG 24 hr capsule Take 1 capsule (150 mg total) by mouth daily with breakfast. Patient not taking: Reported on 05/13/2022 01/07/21   [name], MD         [name], MSN, APRN, NP-C, AGACNP-BC [facility] 05/13/2022, 11:42 AM   Please see [email or web address] for pager details.   From 7A-7P if no response, please call [phone] After hours, please call ELink [phone]

## 2022-05-13 NOTE — Consult Note (Addendum)
Ellen [name]   DOB:1995/07/25   [id number]   [id number]  Subjective: Ellen [name] is a 27 year old woman with history of metastatic breast cancer initially triple positive now triple negative with metastases to the bone, liver, lung (lymphangitic).  [place] was admitted for acute pulmonary embolus, malignant pleural effusions, and shortness of breath.  She was hypoxic at home on 3L of oxygen as low as 74% briefly.  She was found to have acute PE on admission and she continues on heparin gtt for this.  She feels her shortness of breath is similar to where it was yesterday when she was in the ER.  She is on 3 liters of oxygen, and tells me that she continues to desaturate when she gets up and moves around to go to the restroom for instance.  She is also been tachycardic since her admission.  Pulmonary and critical care was consulted and they have waited on her pleural effusion.  They recommend waiting on cytology results to confirm malignancy prior to Pleurx placement.  She was previously admitted from May 07, 2022 to May 09, 2022 for pleural effusion where thoracentesis occurred removing 1.8 L and improving her symptoms.  Cytology confirmed receipt of the specimen for analysis however we are unsure of ETA of the specimen.  Dr. Lindi Adie is awaiting these results for treatment decisions moving forward.    She began palliative radiation to her breast mass on 3/22 and thus far has received once dose.  She has burning pain at her left breast from the malignancy managed with Gabapentin.  She also has pain from her pleural effusions for which she receives Dilaudid IV.  She tells me that she tolerates the Dilaudid well, but thinks she may benefit from a bowel regimen to prevent constipation.    Objective:  Vitals:   05/13/22 1100 05/13/22 1200  BP: (!) 136/93 137/73  Pulse: (!) 128 (!) 130  Resp: (!) 24 (!) 22  Temp:  98.8 F (37.1 C)  SpO2: 94% 94%    Body mass index is 24.79  kg/m.  Intake/Output Summary (Last 24 hours) at 05/13/2022 1309 Last data filed at 05/13/2022 0756 Gross per 24 hour  Intake 331.57 ml  Output --  Net 331.57 ml     Sclerae unicteric  Oropharynx shows early thrush no other lesions  No cervical or supraclavicular adenopathy  Lungs diminished at bases  Heart regular rate and rhythm  Abdomen soft, +BS  Neuro nonfocal      Labs:  Lab Results  Component Value Date   WBC 14.9 (H) 05/13/2022   HGB 12.4 05/13/2022   HCT 39.0 05/13/2022   MCV 90.3 05/13/2022   PLT 320 05/13/2022   NEUTROABS 10.6 (H) [id number]    Basic Metabolic Panel: Recent Labs  Lab 05/07/22 1550 05/08/22 0523 05/12/22 1131 05/13/22 0329  NA 138 135 133* 130*  K 4.2 4.4 3.8 3.8  CL 105 105 98 94*  CO2 23 23 29 27   GLUCOSE 86 81 106* 109*  BUN 6 8 7 9   CREATININE 0.65 0.65 0.48 0.66  CALCIUM 9.2 8.0* 8.5* 7.5*   GFR Estimated Creatinine Clearance: 95 mL/min (by C-G formula based on SCr of 0.66 mg/dL). Liver Function Tests: Recent Labs  Lab 05/07/22 1550 05/13/22 0329  AST 133* 210*  ALT 98* 93*  ALKPHOS 162* 306*  BILITOT 0.4 1.2  PROT 6.6 6.1*  ALBUMIN 3.7 2.5*       Studies:  VAS [place] LOWER EXTREMITY VENOUS (DVT)  Result  Date: 05/13/2022  Lower Venous DVT Study Patient Name:  Ellen [name]  Date of Exam:   05/13/2022 Medical Rec #: [id number]           Accession #:    [id number] Date of Birth: 1996-01-26          Patient Gender: F Patient Age:   52 years Exam Location:  [facility] Procedure:      VAS [place] LOWER EXTREMITY VENOUS (DVT) Referring Phys: DAVID ORTIZ --------------------------------------------------------------------------------  Indications: Pulmonary embolism.  Risk Factors: Cancer. Comparison Study: No previous exams Performing Technologist: Jody Hill RVT, RDMS  Examination Guidelines: A complete evaluation includes B-mode imaging, spectral Doppler, color Doppler, and power Doppler as needed of all accessible  portions of each vessel. Bilateral testing is considered an integral part of a complete examination. Limited examinations for reoccurring indications may be performed as noted. The reflux portion of the exam is performed with the patient in reverse Trendelenburg.  +---------+---------------+---------+-----------+----------+-------------------+ RIGHT    CompressibilityPhasicitySpontaneityPropertiesThrombus Aging      +---------+---------------+---------+-----------+----------+-------------------+ CFV      Full           Yes      Yes                                      +---------+---------------+---------+-----------+----------+-------------------+ SFJ      Full                                                             +---------+---------------+---------+-----------+----------+-------------------+ FV Prox  Full           Yes      Yes                                      +---------+---------------+---------+-----------+----------+-------------------+ FV Mid   Full           Yes      Yes                                      +---------+---------------+---------+-----------+----------+-------------------+ FV DistalFull           Yes      Yes                                      +---------+---------------+---------+-----------+----------+-------------------+ PFV      Full                                                             +---------+---------------+---------+-----------+----------+-------------------+ POP      Full           Yes      Yes                                      +---------+---------------+---------+-----------+----------+-------------------+  PTV      Full                                                             +---------+---------------+---------+-----------+----------+-------------------+ PERO     Full                                         Not well visualized  +---------+---------------+---------+-----------+----------+-------------------+   +---------+---------------+---------+-----------+----------+--------------+ LEFT     CompressibilityPhasicitySpontaneityPropertiesThrombus Aging +---------+---------------+---------+-----------+----------+--------------+ CFV      Full           Yes      Yes                                 +---------+---------------+---------+-----------+----------+--------------+ SFJ      Full                                                        +---------+---------------+---------+-----------+----------+--------------+ FV Prox  Full           Yes      Yes                                 +---------+---------------+---------+-----------+----------+--------------+ FV Mid   Full           Yes      Yes                                 +---------+---------------+---------+-----------+----------+--------------+ FV DistalFull           Yes      Yes                                 +---------+---------------+---------+-----------+----------+--------------+ PFV      Full                                                        +---------+---------------+---------+-----------+----------+--------------+ POP      Full           Yes      Yes                                 +---------+---------------+---------+-----------+----------+--------------+ PTV      Full                                                        +---------+---------------+---------+-----------+----------+--------------+ PERO     Full                                                        +---------+---------------+---------+-----------+----------+--------------+  Summary: BILATERAL: - No evidence of deep vein thrombosis seen in the lower extremities, bilaterally. -No evidence of popliteal cyst, bilaterally.   *See table(s) above for measurements and observations.    Preliminary    CT Angio Chest PE W and/or Wo  Contrast  Result Date: 05/12/2022 CLINICAL DATA:  Chest pain, shortness of breath. History of metastatic breast cancer. EXAM: CT ANGIOGRAPHY CHEST WITH CONTRAST TECHNIQUE: Multidetector CT imaging of the chest was performed using the standard protocol during bolus administration of intravenous contrast. Multiplanar CT image reconstructions and MIPs were obtained to evaluate the vascular anatomy. RADIATION DOSE REDUCTION: This exam was performed according to the departmental dose-optimization program which includes automated exposure control, adjustment of the mA and/or kV according to patient size and/or use of iterative reconstruction technique. CONTRAST:  48mL OMNIPAQUE IOHEXOL 350 MG/ML SOLN COMPARISON:  April 29, 2022. FINDINGS: Cardiovascular: Filling defect is noted extending from distal right pulmonary artery into lower lobe branch consistent with pulmonary embolus. Normal cardiac size. No pericardial effusion. Mediastinum/Nodes: Thyroid gland is unremarkable. Esophagus is unremarkable. Continued presence of bilateral axillary and mediastinal adenopathy is noted consistent with metastatic disease as noted on prior exam. Largest lymph node measures 2.1 cm in right paratracheal region which appears to be enlarged compared to prior exam. Lungs/Pleura: No pneumothorax is noted. Moderate bilateral pleural effusions are noted. Increased interstitial densities are noted throughout both lungs concerning for pulmonary edema. Lymphangitic nodularity is also noted concerning for possible metastatic disease. Upper Abdomen: Heterogeneous appearance of hepatic parenchyma is noted suggesting metastatic disease. Musculoskeletal: Sclerotic metastases are again noted. Review of the MIP images confirms the above findings. IMPRESSION: Linear pulmonary embolus is noted extending from distal portion of right pulmonary artery into lower lobe branches. Critical Value/emergent results were called by telephone at the time of  interpretation on 05/12/2022 at 1:44 pm to provider ADAM CURATOLO , who verbally acknowledged these results. Continued presence of bilateral axillary mediastinal adenopathy consistent with metastatic disease. Moderate bilateral pleural effusions are noted. Increased interstitial densities are noted throughout both lungs concerning for pulmonary edema, as well as lymphangitic nodularity concerning for possible metastatic disease. Heterogeneous appearance of hepatic parenchyma is noted consistent with metastatic disease. Sclerotic osseous metastases are again noted. Electronically Signed   By: Marijo Conception M.D.   On: 05/12/2022 13:44   DG Chest 2 View  Result Date: 05/12/2022 CLINICAL DATA:  Shortness of breath, chest pain EXAM: CHEST - 2 VIEW COMPARISON:  Previous studies including the examination of 05/08/2022 FINDINGS: Cardiac size is within normal limits. Central pulmonary vessels are more prominent. Patchy interstitial and alveolar densities are noted in both lungs with interval worsening. Lateral CP angles are indistinct. There is no pneumothorax. Tip of right IJ central venous catheter is seen in right atrium. IMPRESSION: Central pulmonary vessels are more prominent. There is interval increase in patchy interstitial and alveolar densities in both lungs suggesting worsening of pulmonary edema or worsening of multifocal pneumonia. Small bilateral pleural effusions. There is no pneumothorax. Electronically Signed   By: Elmer Picker M.D.   On: 05/12/2022 12:19    Assessment/Plan: 27 y.o.   Metastatic breast cancer: She has progressed on multiple lines of previous therapy.  We will await cytology results and a prognostic panel before making additional treatment decisions.  Dr. Lindi Adie and I have preliminarily discussed potentially using Doxil.  Ellen [name] is understandably focused on quality and not quantity and she understands that she will need to clinically improved from how she is today in  order to  be able to receive treatment in the future.  She has completed advance care planning documentation and we have had discussions about what end-of-life care looks like when that time comes. Pleural effusion: She is being followed by pulmonary and critical care with Dr. Erin Fulling and [name], NP.  Appreciate their recommendations around waiting on cytology to confirm malignancy prior to pleurx placement.  I reviewed this again with Ellen [name] and her mother and we are aligned with this approach.  We confirmed cytology has the pleural effusion sample from 3/21 thoracentesis and is pending.   Pulmonary Embolus: Continue heparin gtt.  Ok to transition to oral anticoagulants rather than Lovenox injections BID.  Lotte hates injections and in the interest of comfort we will start with oral anticoagulants.   Pain Management: Continuing Dilaudid as needed and this is managing her pain well.  She is also followed by palliative care and was on oral oxycodone as an outpatient. (Was percocet initially because her pharmacy didn't have oxycodone). Prophylaxis: On PPI, bowel regimen Other medical issues per hospitalist team. Thank you [facility] and [facility] for the great care you are providing our mutual patient!  Code status: DNR LOS: [place], NP 05/13/2022  1:09 PM Medical Oncology and Hematology [facility] [address] [place], [place] [address] Tel. [phone]    Fax. [phone]  Ellen [name] was interviewed and examined.  I reviewed the 05/12/2022 CT chest.  I reviewed notes from Dr. Payton Mccallum and Dr. Wetzel Bjornstad.  She has been treated with multiple lines of systemic therapy, most recently Sacituzumab.  There is clinical and radiologic evidence of disease progression.  She is symptomatic with dyspnea.  The dyspnea is likely in large part related to lymphatic tumor spread in the lungs.  There may be some contribution from the pulmonary embolism and pleural effusions.  She has pain  secondary to the local advanced tumor and metastatic disease.  The cytology from the recent thoracentesis is pending.  Systemic treatment options are limited and include standard salvage therapy with Doxil or eribulin.  The expected response rate is low.  Her prognosis for survival beyond weeks is poor.  Recommendations: Continue oxygen, nebulizer, and steroid support Narcotic analgesics for pain and dyspnea Trial of Tussionex for cough Follow-up pleural fluid cytology including breast prognostic panel Continue goals of care discussion with palliative care Consider trial of another salvage systemic therapy agent after further review of treatment history and consultation with Dr. Wetzel Bjornstad Oncology will continue following her in the hospital

## 2022-05-13 NOTE — Consult Note (Signed)
Palliative Care Consult Note                                  Date: 05/13/2022   Patient Name: Ellen [name]  DOB: 12/06/1995  MRN: [id number]  Age / Sex: 27 y.o., female  PCP: Patient, No Pcp Per Referring Physician: Elmarie Shiley, MD  Reason for Consultation: {Reason for Consult:23484}  HPI/Patient Profile:Palliative Care consult requested for goals of care discussion in this 27 y.o. 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  Palliative ask to see for symptom management and goals of care. Patient was admitted on 05/12/2022 with shortness of breath.   Past Medical History:  Diagnosis Date   ADHD (attention deficit hyperactivity disorder)    Anemia    Anxiety    Asthma    Exercise Induced   Cancer ([place])    Depression    Family history of pancreatic cancer    Family history of thyroid cancer    GERD (gastroesophageal reflux disease)    Herpes simplex    Personal history of chemotherapy 08/2020     Subjective:   This NP Osborne Oman reviewed medical records, received report from team, assessed the patient and then met at the patient's bedside with *** to discuss diagnosis, prognosis, GOC, EOL wishes disposition and options.   Concept of Palliative Care was introduced as specialized medical care for people and their families living with serious illness.  It focuses on providing relief from the symptoms and stress of a serious illness.  The goal is to improve quality of life for both the patient and the family. Values and goals of care important to patient and family were attempted to be elicited.  Created space and opportunity for patient and family to explore state of health prior to admission, thoughts, and feelings.   We discussed Her  current illness and what it means in the larger context of Her on-going co-morbidities. Natural disease trajectory and expectations were discussed.  **verbalized understanding of current illness and co-morbidities.   I discussed the importance of continued conversation with family and their medical providers regarding overall plan of care and treatment options, ensuring decisions are within the context of the patients values and GOCs.  Questions and concerns were addressed.  Hard Choices booklet left for review. The family was encouraged to call with questions or concerns.  PMT will continue to support holistically as needed.  Life Review: ***   Objective:   Primary Diagnoses: Present on Admission:  Pulmonary embolus (HCC)  Acute respiratory failure with hypoxia (HCC)  Pleural effusion  Primary malignant neoplasm of breast with metastasis (HCC)  Bilateral pleural effusion  GAD (generalized anxiety disorder)  Depression   Scheduled Meds:  budesonide (PULMICORT) nebulizer solution  0.5 mg Nebulization BID   Chlorhexidine Gluconate Cloth  6 each Topical Daily   docusate sodium  100 mg Oral Daily   feeding supplement  237 mL Oral BID BM   gabapentin  300 mg Oral TID   levalbuterol  0.63 mg Nebulization Q6H   methylPREDNISolone (SOLU-MEDROL) injection  40 mg Intravenous Q12H   pantoprazole  40 mg Oral BID   polyethylene glycol  17 g Oral Daily   venlafaxine XR  225 mg Oral Q breakfast  Continuous Infusions:  heparin 1,100 Units/hr (05/13/22 1614)   lactated ringers 75 mL/hr at 05/13/22 1614   piperacillin-tazobactam (ZOSYN)  IV Stopped (05/13/22 1411)   promethazine (PHENERGAN) injection (IM or IVPB) Stopped (05/13/22 1550)    PRN Meds: acetaminophen **OR** acetaminophen, albuterol, diphenoxylate-atropine, HYDROmorphone (DILAUDID) injection, LORazepam, ondansetron **OR** ondansetron (ZOFRAN) IV, perflutren lipid microspheres (DEFINITY) IV suspension, promethazine  (PHENERGAN) injection (IM or IVPB)  Allergies  Allergen Reactions   Other Other (See Comments)    Pt states that she is allergic to the fabric adhesives; It tears her skin.   Azithromycin Hives    Review of Systems Unless otherwise noted, a complete review of systems is negative.  Physical Exam General: NAD, frail chronically-ill appearing Cardiovascular: regular rate and rhythm Pulmonary: clear ant fields, diminished bilaterally  Abdomen: soft, nontender, + bowel sounds Extremities: no edema, no joint deformities Skin: no rashes, warm and dry Neurological:   Vital Signs:  BP 125/87 (BP Location: Left Arm)   Pulse (!) 120   Temp (!) 96.3 F (35.7 C) (Axillary)   Resp (!) 22   Ht 5\' 5"  (1.651 m)   Wt 67.6 kg   SpO2 95%   BMI 24.79 kg/m  Pain Scale: 0-10   Pain Score: 5   SpO2: SpO2: 95 % O2 Device:SpO2: 95 % O2 Flow Rate: .O2 Flow Rate (L/min): 4 L/min  IO: Intake/output summary:  Intake/Output Summary (Last 24 hours) at 05/13/2022 1622 Last data filed at 05/13/2022 1614 Gross per 24 hour  Intake 961.49 ml  Output --  Net 961.49 ml    LBM: Last BM Date : 05/12/22 Baseline Weight: Weight: 67.6 kg Most recent weight: Weight: 67.6 kg      Palliative Assessment/Data: ***   Advanced Care Planning:   Primary Decision Maker: {Primary Decision [id number]  Code Status/Advance Care Planning: {Palliative Code status:23503}  A discussion was had today regarding advanced directives. Concepts specific to code status, artifical feeding and hydration, continued IV antibiotics and rehospitalization was had.  The difference between a aggressive medical intervention path and a palliative comfort care path was discussed. ***The MOST form was introduced and discussed.***  Hospice and Palliative Care services outpatient were explained and offered. Patient and family verbalized their understanding and awareness of both palliative and hospice's goals and philosophy of care.    Assessment & Plan:   SUMMARY OF RECOMMENDATIONS   DNR/DNI/Full Code-as confirmed by  Continue with current plan of care  PMT will continue to support and follow as needed. Please call team line with urgent needs.  Symptom Management:  Per Attending/See below  Palliative Prophylaxis:  {Palliative Prophylaxis:21015}  Additional Recommendations (Limitations, Scope, Preferences): {Recommended Scope and Preferences:21019}  Psycho-social/Spiritual:  Desire for further Chaplaincy support: {YES NO:22349} Additional Recommendations: {PAL SOCIAL:21064}  Prognosis:  {Palliative Care Prognosis:23504}  Discharge Planning:  {Palliative dispostion:23505}   Discussed with:     Patient*** expressed understanding and was in agreement with this plan.   Time In: *** Time Out: *** Time Total: ***  Visit consisted of counseling and education dealing with the complex and emotionally intense issues of symptom management and palliative care in the setting of serious and potentially life-threatening illness.Greater than 50%  of this time was spent counseling and coordinating care related to the above assessment and plan.  Signed by:  Alda Lea, AGPCNP-BC [place]   Phone: [phone] Pager: [phone] Amion: Bjorn Pippin   Thank you for allowing the Palliative Medicine Team to assist in the care  of this patient. Please utilize secure chat with additional questions, if there is no response within 30 minutes please call the above phone number. Palliative Medicine Team providers are available by phone from 7am to 5pm daily and can be reached through the team cell phone.  Should this patient require assistance outside of these hours, please call the patient's attending physician.  *Please note that this is a verbal dictation therefore any spelling or grammatical errors are due to the "[employer]" system  interpretation.

## 2022-05-13 NOTE — Progress Notes (Addendum)
PROGRESS NOTE    Ellen [name]  [id number] DOB: 01/10/1996 DOA: 05/12/2022 PCP: Patient, No Pcp Per   Brief Narrative: 27 year old with past medical history significant for ADHD, anemia, anxiety, exercise-induced asthma, depression, herpes simplex, GERD, stage IV breast cancer with osseous metastasis, diagnosed 2022, recently discharged 3/22 for acute respiratory failure in the setting of left pleural malignant effusion underwent thoracentesis and discharged home on oxygen.  Patient presented with worsening shortness of breath.  CT chest angio positive for PE, moderate bilateral pleural effusion increasing side.  Increase density.ATTE throughout bothST 1 concerning for pulmonary edema, lymphangitic nodularity concerning for possible metastatic disease.     Assessment & Plan:   Principal Problem:   Pulmonary embolus (HCC) Active Problems:   Depression   GAD (generalized anxiety disorder)   Primary malignant neoplasm of breast with metastasis (HCC)   Acute respiratory failure with hypoxia (HCC)   Pleural effusion   Bilateral pleural effusion   1-Acute Hypoxic Respiratory Failure:  In setting of pleural effusion, PE, increase density BL.  -Start IV antibiotics, Zosyn, pro-calcitonin elevated.  -IV steroids to cover for Pneumonitis.  -Pulmonary consulted, plan is to proceed with thoracentesis prior to pleurex catheter. Pulmonologist consulted.  -Resume Nebulizer.  -ECHO ordered.  -Cytology from prior thoracentesis, not available.   2-PE;  Continue with heparin Gtt.    3-Metastatic Breast Cancer stage IV;  -Dr Benay Spice will see patient in consultation.  -Palliative consulted to assist with pain, symptoms management.   4-Generalized Anxiety Disorder:  Continue with Lorazepam and Effexor.   5-Epigastric pain; start IV Protonix.   6-Transaminases; Numerous hypodense liver lesion. Concern for metastatic diseases.  She was suppose to have Liver Bx  4/01.    Estimated body mass index is 24.79 kg/m as calculated from the following:   Height as of this encounter: 5\' 5"  (1.651 m).   Weight as of this encounter: 67.6 kg.   DVT prophylaxis: Heparin Gtt Code Status: DNR Family Communication: Mother at bedside.  Disposition Plan:  Status is: Inpatient Remains inpatient appropriate because: management of Respiratory failure    Consultants:  CCM Oncology Palliative  Procedures:  ECHO Thoracentesis.   Antimicrobials:    Subjective: She is uncomfortable. Report SOB, unable to take deep breath. She report epigastric pain. And also lower quadrant.   Mother report patient completed antibiotics and steroid taper and 2 days after she develops worsening dyspnea.   Objective: Vitals:   05/13/22 0500 05/13/22 0600 05/13/22 0700 05/13/22 0713  BP: 126/87 (!) 133/98 118/76   Pulse: (!) 129 (!) 132 (!) 136   Resp: (!) 21 19 (!) 25   Temp:    98.7 F (37.1 C)  TempSrc:    Oral  SpO2: (!) 86% 92% 96%   Weight:      Height:        Intake/Output Summary (Last 24 hours) at 05/13/2022 0815 Last data filed at 05/13/2022 0756 Gross per 24 hour  Intake 331.57 ml  Output --  Net 331.57 ml   Filed Weights   05/12/22 1136  Weight: 67.6 kg    Examination:  General exam: NAD Lungs; BL sporadic wheezing, tachypnea.  Cardiovascular system: S1 & S2 heard, Tachycardia Gastrointestinal system: Abdomen soft, tender.  Central nervous system: Alert and oriented.  Extremities; no edema  Data Reviewed: I have personally reviewed following labs and imaging studies  CBC: Recent Labs  Lab 05/07/22 1550 05/08/22 0523 05/12/22 1131 05/13/22 0329  WBC 11.4* 10.1 13.0* 14.9*  NEUTROABS  --   --  10.6*  --   HGB 13.4 12.2 12.6 12.4  HCT 41.6 38.8 38.6 39.0  MCV 89.3 92.6 87.7 90.3  PLT 320 280 315 [id number]   Basic Metabolic Panel: Recent Labs  Lab 05/07/22 1550 05/08/22 0523 05/12/22 1131 05/13/22 0329  NA 138 135 133* 130*  K  4.2 4.4 3.8 3.8  CL 105 105 98 94*  CO2 23 23 29 27   GLUCOSE 86 81 106* 109*  BUN 6 8 7 9   CREATININE 0.65 0.65 0.48 0.66  CALCIUM 9.2 8.0* 8.5* 7.5*   GFR: Estimated Creatinine Clearance: 95 mL/min (by C-G formula based on SCr of 0.66 mg/dL). Liver Function Tests: Recent Labs  Lab 05/07/22 1550 05/13/22 0329  AST 133* 210*  ALT 98* 93*  ALKPHOS 162* 306*  BILITOT 0.4 1.2  PROT 6.6 6.1*  ALBUMIN 3.7 2.5*   Recent Labs  Lab 05/07/22 1550  LIPASE <10*   No results for input(s): "AMMONIA" in the last 168 hours. Coagulation Profile: Recent Labs  Lab 05/13/22 0329  INR 1.3*   Cardiac Enzymes: No results for input(s): "CKTOTAL", "CKMB", "CKMBINDEX", "TROPONINI" in the last 168 hours. BNP (last 3 results) No results for input(s): "PROBNP" in the last 8760 hours. HbA1C: No results for input(s): "HGBA1C" in the last 72 hours. CBG: No results for input(s): "GLUCAP" in the last 168 hours. Lipid Profile: No results for input(s): "CHOL", "HDL", "LDLCALC", "TRIG", "CHOLHDL", "LDLDIRECT" in the last 72 hours. Thyroid Function Tests: No results for input(s): "TSH", "T4TOTAL", "FREET4", "T3FREE", "THYROIDAB" in the last 72 hours. Anemia Panel: No results for input(s): "VITAMINB12", "FOLATE", "FERRITIN", "TIBC", "IRON", "RETICCTPCT" in the last 72 hours. Sepsis Labs: No results for input(s): "PROCALCITON", "LATICACIDVEN" in the last 168 hours.  Recent Results (from the past 240 hour(s))  Resp panel by RT-PCR (RSV, Flu A&B, Covid) Anterior Nasal Swab     Status: None   Collection Time: 05/07/22  3:39 PM   Specimen: Anterior Nasal Swab  Result Value Ref Range Status   SARS Coronavirus 2 by RT PCR NEGATIVE NEGATIVE Final    Comment: (NOTE) SARS-CoV-2 target nucleic acids are NOT DETECTED.  The SARS-CoV-2 RNA is generally detectable in upper respiratory specimens during the acute phase of infection. The lowest concentration of SARS-CoV-2 viral copies this assay can detect  is 138 copies/mL. A negative result does not preclude SARS-Cov-2 infection and should not be used as the sole basis for treatment or other patient management decisions. A negative result may occur with  improper specimen collection/handling, submission of specimen other than nasopharyngeal swab, presence of viral mutation(s) within the areas targeted by this assay, and inadequate number of viral copies(<138 copies/mL). A negative result must be combined with clinical observations, patient history, and epidemiological information. The expected result is Negative.  Fact Sheet for Patients:  EntrepreneurPulse.com.au  Fact Sheet for Healthcare Providers:  IncredibleEmployment.be  This test is no t yet approved or cleared by the [place] FDA and  has been authorized for detection and/or diagnosis of SARS-CoV-2 by FDA under an Emergency Use Authorization (EUA). This EUA will remain  in effect (meaning this test can be used) for the duration of the COVID-19 declaration under Section 564(b)(1) of the Act, 21 U.S.C.section 360bbb-3(b)(1), unless the authorization is terminated  or revoked sooner.       Influenza A by PCR NEGATIVE NEGATIVE Final   Influenza B by PCR NEGATIVE NEGATIVE Final    Comment: (NOTE) The Xpert Xpress SARS-CoV-2/FLU/RSV plus assay is intended as an  aid in the diagnosis of influenza from Nasopharyngeal swab specimens and should not be used as a sole basis for treatment. Nasal washings and aspirates are unacceptable for Xpert Xpress SARS-CoV-2/FLU/RSV testing.  Fact Sheet for Patients: EntrepreneurPulse.com.au  Fact Sheet for Healthcare Providers: IncredibleEmployment.be  This test is not yet approved or cleared by the [place] FDA and has been authorized for detection and/or diagnosis of SARS-CoV-2 by FDA under an Emergency Use Authorization (EUA). This EUA will remain in effect  (meaning this test can be used) for the duration of the COVID-19 declaration under Section 564(b)(1) of the Act, 21 U.S.C. section 360bbb-3(b)(1), unless the authorization is terminated or revoked.     Resp Syncytial Virus by PCR NEGATIVE NEGATIVE Final    Comment: (NOTE) Fact Sheet for Patients: EntrepreneurPulse.com.au  Fact Sheet for Healthcare Providers: IncredibleEmployment.be  This test is not yet approved or cleared by the [place] FDA and has been authorized for detection and/or diagnosis of SARS-CoV-2 by FDA under an Emergency Use Authorization (EUA). This EUA will remain in effect (meaning this test can be used) for the duration of the COVID-19 declaration under Section 564(b)(1) of the Act, 21 U.S.C. section 360bbb-3(b)(1), unless the authorization is terminated or revoked.  Performed at [facility], [address], [place], [place] [address]   Body fluid culture w Gram Stain     Status: None   Collection Time: 05/08/22 11:13 AM   Specimen: PATH Cytology Pleural fluid  Result Value Ref Range Status   Specimen Description   Final    PLEURAL Performed at [facility] [address], [place], [place] [address]    Special Requests   Final    NONE Performed at [facility], [place] [address], [place], [place] [address]    Gram Stain   Final    RARE WBC PRESENT, PREDOMINANTLY MONONUCLEAR NO ORGANISMS SEEN    Culture   Final    NO GROWTH 3 DAYS Performed at [facility], [place] [address] [place], [place] [address]    Report Status 05/12/2022 FINAL  Final  MRSA Next Gen by PCR, Nasal     Status: None   Collection Time: 05/12/22  4:04 PM   Specimen: Nasal Mucosa; Nasal Swab  Result Value Ref Range Status   MRSA by PCR Next Gen NOT DETECTED NOT DETECTED Final    Comment: (NOTE) The GeneXpert MRSA Assay (FDA approved for NASAL specimens only), is one component of  a comprehensive MRSA colonization surveillance program. It is not intended to diagnose MRSA infection nor to guide or monitor treatment for MRSA infections. Test performance is not FDA approved in patients less than 36 years old. Performed at [facility], [place] [address] [place], [place] [address]          Radiology Studies: CT Angio Chest PE W and/or Wo Contrast  Result Date: 05/12/2022 CLINICAL DATA:  Chest pain, shortness of breath. History of metastatic breast cancer. EXAM: CT ANGIOGRAPHY CHEST WITH CONTRAST TECHNIQUE: Multidetector CT imaging of the chest was performed using the standard protocol during bolus administration of intravenous contrast. Multiplanar CT image reconstructions and MIPs were obtained to evaluate the vascular anatomy. RADIATION DOSE REDUCTION: This exam was performed according to the departmental dose-optimization program which includes automated exposure control, adjustment of the mA and/or kV according to patient size and/or use of iterative reconstruction technique. CONTRAST:  73mL OMNIPAQUE IOHEXOL 350 MG/ML SOLN COMPARISON:  April 29, 2022. FINDINGS: Cardiovascular:  Filling defect is noted extending from distal right pulmonary artery into lower lobe branch consistent with pulmonary embolus. Normal cardiac size. No pericardial effusion. Mediastinum/Nodes: Thyroid gland is unremarkable. Esophagus is unremarkable. Continued presence of bilateral axillary and mediastinal adenopathy is noted consistent with metastatic disease as noted on prior exam. Largest lymph node measures 2.1 cm in right paratracheal region which appears to be enlarged compared to prior exam. Lungs/Pleura: No pneumothorax is noted. Moderate bilateral pleural effusions are noted. Increased interstitial densities are noted throughout both lungs concerning for pulmonary edema. Lymphangitic nodularity is also noted concerning for possible metastatic disease. Upper Abdomen:  Heterogeneous appearance of hepatic parenchyma is noted suggesting metastatic disease. Musculoskeletal: Sclerotic metastases are again noted. Review of the MIP images confirms the above findings. IMPRESSION: Linear pulmonary embolus is noted extending from distal portion of right pulmonary artery into lower lobe branches. Critical Value/emergent results were called by telephone at the time of interpretation on 05/12/2022 at 1:44 pm to provider ADAM CURATOLO , who verbally acknowledged these results. Continued presence of bilateral axillary mediastinal adenopathy consistent with metastatic disease. Moderate bilateral pleural effusions are noted. Increased interstitial densities are noted throughout both lungs concerning for pulmonary edema, as well as lymphangitic nodularity concerning for possible metastatic disease. Heterogeneous appearance of hepatic parenchyma is noted consistent with metastatic disease. Sclerotic osseous metastases are again noted. Electronically Signed   By: Marijo Conception M.D.   On: 05/12/2022 13:44   DG Chest 2 View  Result Date: 05/12/2022 CLINICAL DATA:  Shortness of breath, chest pain EXAM: CHEST - 2 VIEW COMPARISON:  Previous studies including the examination of 05/08/2022 FINDINGS: Cardiac size is within normal limits. Central pulmonary vessels are more prominent. Patchy interstitial and alveolar densities are noted in both lungs with interval worsening. Lateral CP angles are indistinct. There is no pneumothorax. Tip of right IJ central venous catheter is seen in right atrium. IMPRESSION: Central pulmonary vessels are more prominent. There is interval increase in patchy interstitial and alveolar densities in both lungs suggesting worsening of pulmonary edema or worsening of multifocal pneumonia. Small bilateral pleural effusions. There is no pneumothorax. Electronically Signed   By: Elmer Picker M.D.   On: 05/12/2022 12:19        Scheduled Meds:  Chlorhexidine Gluconate  Cloth  6 each Topical Daily   feeding supplement  237 mL Oral BID BM   gabapentin  300 mg Oral TID   pantoprazole  40 mg Oral Daily   venlafaxine XR  225 mg Oral Q breakfast   Continuous Infusions:  heparin 1,100 Units/hr (05/13/22 0756)   promethazine (PHENERGAN) injection (IM or IVPB) Stopped (05/13/22 0135)     LOS: 1 day    Time spent: [place], MD [facility]   If 7PM-7AM, please contact night-coverage [email or web address]  05/13/2022, 8:15 AM

## 2022-05-14 ENCOUNTER — Other Ambulatory Visit (HOSPITAL_COMMUNITY): Payer: Self-pay

## 2022-05-14 ENCOUNTER — Encounter: Payer: Self-pay | Admitting: General Practice

## 2022-05-14 ENCOUNTER — Encounter: Payer: Self-pay | Admitting: *Deleted

## 2022-05-14 ENCOUNTER — Other Ambulatory Visit: Payer: Self-pay

## 2022-05-14 ENCOUNTER — Inpatient Hospital Stay (HOSPITAL_COMMUNITY): Payer: Medicaid Other

## 2022-05-14 ENCOUNTER — Encounter: Payer: Self-pay | Admitting: Urology

## 2022-05-14 ENCOUNTER — Ambulatory Visit
Admission: RE | Admit: 2022-05-14 | Discharge: 2022-05-14 | Disposition: A | Discharge status: Home / Self Care | Payer: Medicaid Other | Source: Ambulatory Visit | Attending: Radiation Oncology | Admitting: Radiation Oncology

## 2022-05-14 DIAGNOSIS — I2699 Other pulmonary embolism without acute cor pulmonale: Secondary | ICD-10-CM | POA: Diagnosis not present

## 2022-05-14 DIAGNOSIS — C50919 Malignant neoplasm of unspecified site of unspecified female breast: Secondary | ICD-10-CM | POA: Diagnosis not present

## 2022-05-14 DIAGNOSIS — J9601 Acute respiratory failure with hypoxia: Secondary | ICD-10-CM | POA: Diagnosis not present

## 2022-05-14 DIAGNOSIS — Z7189 Other specified counseling: Secondary | ICD-10-CM | POA: Diagnosis not present

## 2022-05-14 DIAGNOSIS — Z515 Encounter for palliative care: Secondary | ICD-10-CM

## 2022-05-14 DIAGNOSIS — Z66 Do not resuscitate: Secondary | ICD-10-CM | POA: Diagnosis not present

## 2022-05-14 DIAGNOSIS — G893 Neoplasm related pain (acute) (chronic): Secondary | ICD-10-CM

## 2022-05-14 LAB — CBC
HCT: 36.8 % (ref 36.0–46.0)
Hemoglobin: 11.7 g/dL — ABNORMAL LOW (ref 12.0–15.0)
MCH: 28.6 pg (ref 26.0–34.0)
MCHC: 31.8 g/dL (ref 30.0–36.0)
MCV: 90 fL (ref 80.0–100.0)
Platelets: 320 10*3/uL (ref 150–400)
RBC: 4.09 MIL/uL (ref 3.87–5.11)
RDW: 15.3 % (ref 11.5–15.5)
WBC: 12.3 10*3/uL — ABNORMAL HIGH (ref 4.0–10.5)
nRBC: 0 % (ref 0.0–0.2)

## 2022-05-14 LAB — HEPARIN LEVEL (UNFRACTIONATED): Heparin Unfractionated: 0.55 IU/mL (ref 0.30–0.70)

## 2022-05-14 LAB — RAD ONC ARIA SESSION SUMMARY
Course Elapsed Days: 5
Plan Fractions Treated to Date: 2
Plan Prescribed Dose Per Fraction: 3 Gy
Plan Total Fractions Prescribed: 10
Plan Total Prescribed Dose: 30 Gy
Reference Point Dosage Given to Date: 6 Gy
Reference Point Session Dosage Given: 3 Gy
Session Number: 2

## 2022-05-14 LAB — BASIC METABOLIC PANEL
Anion gap: 9 (ref 5–15)
BUN: 11 mg/dL (ref 6–20)
CO2: 26 mmol/L (ref 22–32)
Calcium: 7.1 mg/dL — ABNORMAL LOW (ref 8.9–10.3)
Chloride: 95 mmol/L — ABNORMAL LOW (ref 98–111)
Creatinine, Ser: 0.59 mg/dL (ref 0.44–1.00)
GFR, Estimated: >60 mL/min (ref >60)
Glucose, Bld: 130 mg/dL — ABNORMAL HIGH (ref 70–99)
Potassium: 4 mmol/L (ref 3.5–5.1)
Sodium: 130 mmol/L — ABNORMAL LOW (ref 135–145)

## 2022-05-14 MED ORDER — MORPHINE SULFATE (PF) 2 MG/ML IV SOLN
2.0000 mg | INTRAVENOUS | Status: DC | PRN
  Administered 2022-05-14 (×2): 2 mg via INTRAVENOUS
  Filled 2022-05-14 (×2): qty 1

## 2022-05-14 MED ORDER — FLUCONAZOLE 100 MG PO TABS
100.0000 mg | ORAL_TABLET | Freq: Every day | ORAL | Status: DC
  Administered 2022-05-14 – 2022-05-15 (×2): 100 mg via ORAL
  Filled 2022-05-14 (×2): qty 1

## 2022-05-14 MED ORDER — MORPHINE SULFATE (PF) 2 MG/ML IV SOLN
2.0000 mg | INTRAVENOUS | Status: DC | PRN
  Administered 2022-05-14 – 2022-05-16 (×8): 2 mg via INTRAVENOUS
  Filled 2022-05-14 (×9): qty 1

## 2022-05-14 MED ORDER — FUROSEMIDE 10 MG/ML IJ SOLN
20.0000 mg | Freq: Once | INTRAMUSCULAR | Status: AC
  Administered 2022-05-14: 20 mg via INTRAVENOUS
  Filled 2022-05-14: qty 2

## 2022-05-14 MED ORDER — LORAZEPAM 2 MG/ML IJ SOLN
1.0000 mg | Freq: Three times a day (TID) | INTRAMUSCULAR | Status: DC | PRN
  Administered 2022-05-14 – 2022-05-15 (×3): 1 mg via INTRAVENOUS
  Filled 2022-05-14 (×4): qty 1

## 2022-05-14 MED ORDER — IPRATROPIUM BROMIDE 0.02 % IN SOLN
0.5000 mg | Freq: Once | RESPIRATORY_TRACT | Status: AC
  Administered 2022-05-14: 0.5 mg via RESPIRATORY_TRACT
  Filled 2022-05-14: qty 2.5

## 2022-05-14 MED ORDER — MORPHINE SULFATE (PF) 2 MG/ML IV SOLN
INTRAVENOUS | Status: AC
  Administered 2022-05-14: 2 mg via INTRAVENOUS
  Filled 2022-05-14: qty 1

## 2022-05-14 MED ORDER — HYDROMORPHONE HCL 1 MG/ML IJ SOLN
1.0000 mg | INTRAMUSCULAR | Status: DC | PRN
  Administered 2022-05-14 – 2022-05-15 (×8): 1 mg via INTRAVENOUS
  Filled 2022-05-14 (×9): qty 1

## 2022-05-14 NOTE — Progress Notes (Signed)
ANTICOAGULATION CONSULT NOTE - Follow up  Pharmacy Consult for heparin Indication: pulmonary embolus  Allergies  Allergen Reactions   Other Other (See Comments)    Pt states that she is allergic to the fabric adhesives; It tears her skin.   Azithromycin Hives    Patient Measurements: Height: 5\' 5"  (165.1 cm) Weight: 67.6 kg (149 lb) IBW/kg (Calculated) : 57 Heparin Dosing Weight: 67.6kg  Vital Signs: Temp: 97.6 F (36.4 C) (03/27 0400) Temp Source: Oral (03/27 0400) BP: 143/95 (03/27 0600) Pulse Rate: 100 (03/27 0600)  Labs: Recent Labs    05/12/22 1131 05/12/22 2054 05/13/22 0329 05/14/22 0334  HGB 12.6  --  12.4 11.7*  HCT 38.6  --  39.0 36.8  PLT 315  --  320 320  APTT  --   --  133*  --   LABPROT  --   --  15.6*  --   INR  --   --  1.3*  --   HEPARINUNFRC  --  0.63 0.55 0.55  CREATININE 0.48  --  0.66  --      Estimated Creatinine Clearance: 95 mL/min (by C-G formula based on SCr of 0.66 mg/dL).   Medical History: Past Medical History:  Diagnosis Date   ADHD (attention deficit hyperactivity disorder)    Anemia    Anxiety    Asthma    Exercise Induced   Cancer (HCC)    Depression    Family history of pancreatic cancer    Family history of thyroid cancer    GERD (gastroesophageal reflux disease)    Herpes simplex    Personal history of chemotherapy 08/2020     Assessment: 26 yof presented to the ED with CP and SOB. Found to have a PE. Baseline CBC is WNL and she is not on anticoagulation PTA.   Today, 05/12/22: - Heparin Level 0.55 units/ml, therapeutic, on IV heparin at 1100 units/hr - Hgb 11.7, Plt [id number] - No complications of therapy noted  Goal of Therapy:  Heparin level 0.3-0.7 units/ml Monitor platelets by anticoagulation protocol: Yes    Plan:  - Continue Heparin gtt at 1100 units/hr  - Continue to monitor for signs/symptoms of bleeding. - Daily CBC & heparin level - Will follow for transition to oral anticoagulation as  indicated   Tawnya Crook, PharmD, BCPS Clinical Pharmacist 05/14/2022 8:24 AM

## 2022-05-14 NOTE — Progress Notes (Signed)
NAME:  Ellen [name], MRN:  [id number], DOB:  [birth year], LOS: 2 ADMISSION DATE:  05/12/2022, CONSULTATION DATE:  3/26 REFERRING MD:  Dr. Tyrell Antonio, CHIEF COMPLAINT:  SOB   History of Present Illness:  27 year old female who presented to [facility] ER on 3/25 with reports of shortness of breath.  She unfortunately carries a history of stage IV breast cancer.  She was initially diagnosed in June 2022.  At that time she had lymph node, rib, thoracic spine, liver metastasis.  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  She underwent palliative radiation to the lumbar spine and pelvic lesions.  Her initial chemotherapy regimen was Herceptin/Perjeta and Taxotere.  She had complications of refractory diarrhea requiring hospitalization requiring therapy adjustment.  Her genetic testing was negative. She is followed by Dr. Lindi Adie in [place].   The patient was recently evaluated (05/05/2022) at [facility].  Review of the visit from [place] shows the following: The patient most recently has been on Sacituzumab but it is felt by her care team that this is no longer working.  In addition she has been on carboplatin and gemcitabine.  She has never had immunotherapy as her PD-L1 was negative.  Despite therapy the patient had noted progression of her breast tumor and thickening of the skin.  She had ongoing discomfort despite taking gabapentin.  She has also received radiation to her lower spine but not her chest wall.  In December 2023 the patient began having difficulties with breathing.  Her scans at that time showed possible mild lung involvement with patchy areas throughout.  She had a viral illness at the end of December which may have triggered an asthma exacerbation.  She was treated with her albuterol, steroid inhaler, Medrol Dosepak and Z-Pak.  The patient reported  approximately 85% improvement after completion.  Recommendations from the [facility] breast cancer clinic included performing a liver biopsy with repeat biomarker testing, cough control, and transition to a regimen that will treat both triple negative and HER2 positive clone such as HER2 CLIMB regimen or eribulin with margetuximab.    The patient is hopeful to take a trip to [place] in [place] from 4/16 through 4/30.   In December 2023, CT imaging of chest showed no effusion & parenchyma was largely unremarkable.  On April 29, 2022 CT showed significant increase in intralobular septal thickening & new moderate left pleural effusion.  After her [facility] visit she underwent a thoracentesis 3/21 with 1.8L obtained with intent to send for cell block for Her2 testing to determine next steps. She was planned for liver biopsy and repeat thoracentesis on 05/19/22.  However, the patient presented to [facility] ER on 3/25 with reports of tightness in her upper abdomen, nausea, 1 episode of vomiting & shortness of breath.  She was evaluated with a CTA which showed a right sided pulmonary embolism and moderate bilateral pleural effusions.   She was admitted and started on heparin infusion for pulmonary embolism.   PCCM consulted for pulmonary evaluation.   Pertinent  Medical History  ADHD Anemia Depression / Anxiety  Exercise Induced Asthma  GERD Family hx of pancreatic, thyroid cancer  Significant Hospital Events: Including procedures, antibiotic start and stop dates in addition to other pertinent events   3/25 Admit 3/26 PCCM consulted  3/27 Pleural fluid cytology returned from 3/21 > positive for malignancy  Interim History / Subjective:  Pt reports feeling SOB, feels like her left side is full  Afebrile  On 5L O2   Objective   Blood pressure (!) 143/95, pulse 100, temperature 97.6 F (36.4 C), temperature source Oral, resp. rate (!) 21, height 5\' 5"  (1.651 m), weight 67.6 kg, SpO2 99 %.         Intake/Output Summary (Last 24 hours) at 05/14/2022 0847 Last data filed at 05/14/2022 0220 Gross per 24 hour  Intake 1250.31 ml  Output --  Net 1250.31 ml   Filed Weights   05/12/22 1136  Weight: 67.6 kg    Examination: General: young adult female sitting up in bed in NAD HEENT: MM pink/moist, alopecia from chemo,  O2 Neuro: AAOx4, speech clear, MAE  CV: s1s2 RRR, ST 130's on monitor no m/r/g PULM: tachypnea but non labored, difficulty with prolonged conversation compared to 3/26 exam GI: soft, bsx4 active  Extremities: warm/dry, no edema  Skin: no rashes or lesions  Left Pleural Fluid > malignant cells consistent with known primary breast cancer   Resolved Hospital Problem list     Assessment & Plan:   Acute Hypoxic Respiratory Failure in setting of PE and suspected Lymphangitic Spread  Dyspnea  New O2 need on admit, right side PE from distal pulmonary artery extending into lower lobe branch.  Suspect CT findings represent lymphangitic spread of breast cancer. Less likely radiation pneumonitis given timing and only one treatment thus far to chest.  Less likely infectious process. Note elevated PCT in setting of malignancy.  -wean O2 to support sats >90% -anticipate she will need O2 at discharge  -pulmonary hygiene -IS, mobilize as able  -continue heparin infusion per pharmacy  -treat symptom burden with anxiolytics, PRN pain control  -empiric abx per [facility] for CAP, consider stopping  -DNR/DNI -PRN morphine for dyspnea  Bilateral Pleural Effusions Concerning for malignant progression of disease.  S/p thora on 3/21 with 1.8L removed, left pleural fluid positive for malignant cells, consistent with known primary breast cancer.  -have consulted and reached out ot [facility] to determine coverage for PleurX bottles / equipment  -tentatively plan for PleurX on 3/28 pending review of coverage for bottles  -will need to hold heparin 6 hr prior to procedure timing (TBD)   Best  Practice (right click and "Reselect all SmartList Selections" daily)  Per Primary      [name], MSN, APRN, NP-C, AGACNP-BC [facility] 05/14/2022, 8:47 AM   Please see [email or web address] for pager details.   From 7A-7P if no response, please call [phone] After hours, please call ELink [phone]

## 2022-05-14 NOTE — TOC Benefit Eligibility Note (Signed)
Patient Teacher, English as a foreign language completed.    The patient is currently admitted and upon discharge could be taking Eliquis 5 mg.  The current 30 day co-pay is $4.00.   The patient is insured through [place] [employer]   This test claim was processed through [place] amounts may vary at other pharmacies due to pharmacy/plan contracts, or as the patient moves through the different stages of their insurance plan.  Lyndel Safe, [place] Patient Advocate Specialist [place] Patient Advocate Team Direct Number: [phone]  Fax: [phone]

## 2022-05-14 NOTE — Progress Notes (Signed)
[place] Spiritual Care Note  Got to see Ellen [name] briefly in [facility] ICU while she was preparing for transport. She was very appreciative of the handmade blessing blanket. We plan to keep trying to connect.   [place], [place], [facility] Pager [phone] Voicemail [phone]

## 2022-05-14 NOTE — Progress Notes (Signed)
Per NP request, RN placed call to [facility] pathology department and spoke with [facility] regarding the need to add STAT Breast Prognostic Panel to pt recent cytology report from 05/08/22.  [place] verbalized understanding.  RN also sent an email to [facility] and Dr. Vic Ripper to add the STAT panel.

## 2022-05-14 NOTE — Progress Notes (Incomplete)
   Palliative Medicine Inpatient Follow Up Note     Chart Reviewed. Patient assessed at the bedside.    Discussed the importance of continued conversation with family and their  medical providers regarding overall plan of care and treatment options, ensuring decisions are within the context of the patients values and GOCs.   Questions addressed and support provided.    Objective Assessment: Vital Signs Vitals:   05/14/22 1755 05/14/22 1900  BP: (!) 169/113 (!) 150/91  Pulse: (!) 128 (!) 133  Resp: (!) 39 (!) 24  Temp:  98.3 F (36.8 C)  SpO2: 96% 97%    Intake/Output Summary (Last 24 hours) at 05/14/2022 2024 Last data filed at 05/14/2022 1843 Gross per 24 hour  Intake 634.99 ml  Output 500 ml  Net 134.99 ml   Last Weight  Most recent update: 05/12/2022 11:37 AM    Weight  67.6 kg (149 lb)            Gen:  NAD HEENT: moist mucous membranes CV: Regular rate and rhythm, no murmurs rubs or gallops PULM: clear to auscultation bilaterally. No wheezes/rales/rhonchi*** ABD: soft/nontender/nondistended/normal bowel sounds*** EXT: No edema*** Neuro: Alert and oriented x3***  SUMMARY OF RECOMMENDATIONS   Continue with current plan of care per medical team  PMT will continue to support and follow on as needed basis. Please secure chat for urgent needs.   Discussed with Dr. Marland Kitchen  Time Total: ***  Visit consisted of counseling and education dealing with the complex and emotionally intense issues of symptom management and palliative care in the setting of serious and potentially life-threatening illness.Greater than 50%  of this time was spent counseling and coordinating care related to the above assessment and plan.  Alda Lea, AGPCNP-BC  [place]  [phone]  Palliative Medicine Team providers are available by phone from 7am to 7pm daily and can be reached through the team cell phone. Should this patient  require assistance outside of these hours, please call the patient's attending physician.*Please note that this is a verbal dictation therefore any spelling or grammatical errors are due to the "[place]" system interpretation.

## 2022-05-14 NOTE — TOC Progression Note (Addendum)
Transition of Care [facility]) - Progression Note    Patient Details  Name: Ellen [name] MRN: [id number] Date of Birth: 1995/02/23  Transition [facility] Care [facility]) CM/SW [place], RN Phone Number: 05/14/2022, 2:28 PM  Clinical Narrative:   Per chart review patient recently discharge home with home oxygen with [employer]. Per Leonard Downing with [facility] care will follow for [facility]. Per Velna Hatchet NP pleurx will be placed in ICU not IR department. This RNCM placed a blank [employer] Pleural drainage supplies form in patient's chart. Awaiting NP/MD completion, will complete remainder and fax for supplies.  TOC will continue to follow for needs.    - 2:37pm this RNCM received call from [place] with [facility] who reports she will not be able to accept patient for [facility] due to insurance will not cover.  This RNCM spoke with patient at bedside to advise of unable to get [facility] services however services can be considered via hospice coverage. Patient is in agreement with home hospice conversation with [facility] today. This RNCM notified MD, NP, Kasie with [facility].  This RNCM confirmed with Brenton Grills w/[employer] that they are following patient for home oxygen services.  TOC will continue to follow.  - 5:13pm Current plan for pleurx placement 05/15/22. Patient has spoke with Leonard Downing with [facility] at bedside and accepting services. Kasie with [facility] will follow for home hospice services to assist with pleurX supplies and care in the home.   TOC will continue to follow.   Expected Discharge Plan: [place] Barriers to Discharge: Continued Medical Work up  Expected Discharge Plan and Services   Discharge Planning Services: CM Consult Post Acute Care Choice: [place] arrangements for the past 2 months: Single Family Home                 DME Arranged: Oxygen         HH Arranged: RN HH Agency: Other - See comment ([facility] rep Kasi) Date [place]:  05/14/22 Time [place]: [id number] Representative spoke with at Door: [place] Determinants of Health ([place]) Interventions SDOH Screenings   Food Insecurity: No Food Insecurity (05/12/2022)  Housing: Low Risk  (05/12/2022)  Transportation Needs: No Transportation Needs (05/12/2022)  Utilities: Not At Risk (05/12/2022)  Financial Resource Strain: High Risk (05/08/2021)  Tobacco Use: Low Risk  (05/12/2022)    Readmission Risk Interventions    05/14/2022   12:45 PM  Readmission Risk Prevention Plan  Transportation Screening Complete  Medication Review ([place]) Complete  PCP or Specialist appointment within 3-5 days of discharge Complete  HRI or [place] Complete  SW Recovery Care/Counseling Consult Complete  [place] Not Applicable

## 2022-05-14 NOTE — Progress Notes (Signed)
PROGRESS NOTE    Ellen [name]  [id number] DOB: Apr 27, 1995 DOA: 05/12/2022 PCP: Patient, No Pcp Per   Brief Narrative: 27 year old with past medical history significant for ADHD, anemia, anxiety, exercise-induced asthma, depression, herpes simplex, GERD, stage IV breast cancer with osseous metastasis, diagnosed 2022, recently discharged 3/22 for acute respiratory failure in the setting of left pleural malignant effusion underwent thoracentesis and discharged home on oxygen.  Patient presented with worsening shortness of breath.  CT chest angio positive for PE, moderate bilateral pleural effusion increasing side.  Increase density.ATTE throughout bothST 1 concerning for pulmonary edema, lymphangitic nodularity concerning for possible metastatic disease.  05/14/2022: Patient seen.  Pleural fluid analysis revealed malignant cells.  Pleurx catheter will be placed by the ICU team tomorrow.  Hopefully, patient will be discharged back home on Friday with home health nursing.  Patient was seen alongside patient's mother.  No new complaints.  Assessment & Plan:   Principal Problem:   Pulmonary embolus (HCC) Active Problems:   Depression   GAD (generalized anxiety disorder)   Primary malignant neoplasm of breast with metastasis (HCC)   Acute respiratory failure with hypoxia (HCC)   Pleural effusion   Bilateral pleural effusion   1-Acute Hypoxic Respiratory Failure:  In setting of pleural effusion, PE, increase density BL.  -Start IV antibiotics, Zosyn, pro-calcitonin elevated.  -IV steroids to cover for Pneumonitis.  -Pulmonary consulted, plan is to proceed with thoracentesis prior to pleurex catheter. Pulmonologist consulted.  -Resume Nebulizer.  -ECHO ordered.  -Cytology from prior thoracentesis, not available.  05/14/2022: Resolved significantly.  Malignant pleural effusion is noted.  For Pleurx catheter placement tomorrow.  Likely discharge in 2 days time.  2-PE;  Continue  with heparin Gtt.  05/14/2022: Heparin drip will be held tomorrow prior to Pleurx catheter placement.  Likely, patient will be discharged on Eliquis.  3-Metastatic Breast Cancer stage IV;  -Dr Benay Spice will see patient in consultation.  -Palliative consulted to assist with pain, symptoms management.  05/14/2022: Oncology team is directing care.  4-Generalized Anxiety Disorder:  Continue with Lorazepam and Effexor.   5-Epigastric pain; start IV Protonix.  05/14/2022: Resolved significantly.  6-Transaminases; Numerous hypodense liver lesion. Concern for metastatic diseases.  She was suppose to have Liver Bx 4/01.    Estimated body mass index is 24.79 kg/m as calculated from the following:   Height as of this encounter: 5\' 5"  (1.651 m).   Weight as of this encounter: 67.6 kg.   DVT prophylaxis: Heparin Gtt Code Status: DNR Family Communication: Mother at bedside.  Disposition Plan:  Status is: Inpatient Remains inpatient appropriate because: management of Respiratory failure    Consultants:  [facility] Oncology Palliative  Procedures:  ECHO Thoracentesis.   Antimicrobials:    Subjective: No worsening of shortness of breath. No fever or chills. Pain is controlled.  Objective: Vitals:   05/14/22 0500 05/14/22 0600 05/14/22 0823 05/14/22 0830  BP: (!) 146/102 (!) 143/95    Pulse: 97 100    Resp: 20 (!) 21    Temp:    (!) 97.5 F (36.4 C)  TempSrc:    Oral  SpO2: 95% 97% 99%   Weight:      Height:        Intake/Output Summary (Last 24 hours) at 05/14/2022 1040 Last data filed at 05/14/2022 0943 Gross per 24 hour  Intake 1400.45 ml  Output --  Net 1400.45 ml    Filed Weights   05/12/22 1136  Weight: 67.6 kg    Examination:  General exam: NAD Lungs; decreased air entry..  Cardiovascular system: S1 & S2 heard, Gastrointestinal system: Abdomen soft, tender.  Central nervous system: Alert and oriented.  Extremities; no edema  Data Reviewed: I have  personally reviewed following labs and imaging studies  CBC: Recent Labs  Lab 05/07/22 1550 05/08/22 0523 05/12/22 1131 05/13/22 0329 05/14/22 0334  WBC 11.4* 10.1 13.0* 14.9* 12.3*  NEUTROABS  --   --  10.6*  --   --   HGB 13.4 12.2 12.6 12.4 11.7*  HCT 41.6 38.8 38.6 39.0 36.8  MCV 89.3 92.6 87.7 90.3 90.0  PLT 320 280 315 320 [id number]    Basic Metabolic Panel: Recent Labs  Lab 05/07/22 1550 05/08/22 0523 05/12/22 1131 05/13/22 0329  NA 138 135 133* 130*  K 4.2 4.4 3.8 3.8  CL 105 105 98 94*  CO2 23 23 29 27   GLUCOSE 86 81 106* 109*  BUN 6 8 7 9   CREATININE 0.65 0.65 0.48 0.66  CALCIUM 9.2 8.0* 8.5* 7.5*    GFR: Estimated Creatinine Clearance: 95 mL/min (by C-G formula based on SCr of 0.66 mg/dL). Liver Function Tests: Recent Labs  Lab 05/07/22 1550 05/13/22 0329  AST 133* 210*  ALT 98* 93*  ALKPHOS 162* 306*  BILITOT 0.4 1.2  PROT 6.6 6.1*  ALBUMIN 3.7 2.5*    Recent Labs  Lab 05/07/22 1550  LIPASE <10*    No results for input(s): "AMMONIA" in the last 168 hours. Coagulation Profile: Recent Labs  Lab 05/13/22 0329  INR 1.3*    Cardiac Enzymes: No results for input(s): "CKTOTAL", "CKMB", "CKMBINDEX", "TROPONINI" in the last 168 hours. BNP (last 3 results) No results for input(s): "PROBNP" in the last 8760 hours. HbA1C: No results for input(s): "HGBA1C" in the last 72 hours. CBG: No results for input(s): "GLUCAP" in the last 168 hours. Lipid Profile: No results for input(s): "CHOL", "HDL", "LDLCALC", "TRIG", "CHOLHDL", "LDLDIRECT" in the last 72 hours. Thyroid Function Tests: No results for input(s): "TSH", "T4TOTAL", "FREET4", "T3FREE", "THYROIDAB" in the last 72 hours. Anemia Panel: No results for input(s): "VITAMINB12", "FOLATE", "FERRITIN", "TIBC", "IRON", "RETICCTPCT" in the last 72 hours. Sepsis Labs: Recent Labs  Lab 05/13/22 1021  PROCALCITON 1.36    Recent Results (from the past 240 hour(s))  Resp panel by RT-PCR (RSV, Flu  A&B, Covid) Anterior Nasal Swab     Status: None   Collection Time: 05/07/22  3:39 PM   Specimen: Anterior Nasal Swab  Result Value Ref Range Status   SARS Coronavirus 2 by RT PCR NEGATIVE NEGATIVE Final    Comment: (NOTE) SARS-CoV-2 target nucleic acids are NOT DETECTED.  The SARS-CoV-2 RNA is generally detectable in upper respiratory specimens during the acute phase of infection. The lowest concentration of SARS-CoV-2 viral copies this assay can detect is 138 copies/mL. A negative result does not preclude SARS-Cov-2 infection and should not be used as the sole basis for treatment or other patient management decisions. A negative result may occur with  improper specimen collection/handling, submission of specimen other than nasopharyngeal swab, presence of viral mutation(s) within the areas targeted by this assay, and inadequate number of viral copies(<138 copies/mL). A negative result must be combined with clinical observations, patient history, and epidemiological information. The expected result is Negative.  Fact Sheet for Patients:  EntrepreneurPulse.com.au  Fact Sheet for Healthcare Providers:  IncredibleEmployment.be  This test is no t yet approved or cleared by the [place] FDA and  has been authorized for detection and/or diagnosis  of SARS-CoV-2 by FDA under an Emergency Use Authorization (EUA). This EUA will remain  in effect (meaning this test can be used) for the duration of the COVID-19 declaration under Section 564(b)(1) of the Act, 21 U.S.C.section 360bbb-3(b)(1), unless the authorization is terminated  or revoked sooner.       Influenza A by PCR NEGATIVE NEGATIVE Final   Influenza B by PCR NEGATIVE NEGATIVE Final    Comment: (NOTE) The Xpert Xpress SARS-CoV-2/FLU/RSV plus assay is intended as an aid in the diagnosis of influenza from Nasopharyngeal swab specimens and should not be used as a sole basis for treatment.  Nasal washings and aspirates are unacceptable for Xpert Xpress SARS-CoV-2/FLU/RSV testing.  Fact Sheet for Patients: EntrepreneurPulse.com.au  Fact Sheet for Healthcare Providers: IncredibleEmployment.be  This test is not yet approved or cleared by the [place] FDA and has been authorized for detection and/or diagnosis of SARS-CoV-2 by FDA under an Emergency Use Authorization (EUA). This EUA will remain in effect (meaning this test can be used) for the duration of the COVID-19 declaration under Section 564(b)(1) of the Act, 21 U.S.C. section 360bbb-3(b)(1), unless the authorization is terminated or revoked.     Resp Syncytial Virus by PCR NEGATIVE NEGATIVE Final    Comment: (NOTE) Fact Sheet for Patients: EntrepreneurPulse.com.au  Fact Sheet for Healthcare Providers: IncredibleEmployment.be  This test is not yet approved or cleared by the [place] FDA and has been authorized for detection and/or diagnosis of SARS-CoV-2 by FDA under an Emergency Use Authorization (EUA). This EUA will remain in effect (meaning this test can be used) for the duration of the COVID-19 declaration under Section 564(b)(1) of the Act, 21 U.S.C. section 360bbb-3(b)(1), unless the authorization is terminated or revoked.  Performed at [facility], [address] [place], [place] [address]   Body fluid culture w Gram Stain     Status: None   Collection Time: 05/08/22 11:13 AM   Specimen: PATH Cytology Pleural fluid  Result Value Ref Range Status   Specimen Description   Final    PLEURAL Performed at [facility] [address], [place], [place] [address]    Special Requests   Final    NONE Performed at [facility], [place] [address] [place], [place] [address]    Gram Stain   Final    RARE WBC PRESENT, PREDOMINANTLY MONONUCLEAR NO ORGANISMS SEEN     Culture   Final    NO GROWTH 3 DAYS Performed at [facility], [place] [address], [place], [place] [address]    Report Status 05/12/2022 FINAL  Final  MRSA Next Gen by PCR, Nasal     Status: None   Collection Time: 05/12/22  4:04 PM   Specimen: Nasal Mucosa; Nasal Swab  Result Value Ref Range Status   MRSA by PCR Next Gen NOT DETECTED NOT DETECTED Final    Comment: (NOTE) The GeneXpert MRSA Assay (FDA approved for NASAL specimens only), is one component of a comprehensive MRSA colonization surveillance program. It is not intended to diagnose MRSA infection nor to guide or monitor treatment for MRSA infections. Test performance is not FDA approved in patients less than 18 years old. Performed at [facility], [place] [address] [place], [place] [address]          Radiology Studies: VAS [place] LOWER EXTREMITY VENOUS (DVT)  Result Date: 05/13/2022  Lower Venous DVT Study Patient Name:  MAKHARI DESPIRITO  Date of Exam:  05/13/2022 Medical Rec #: [id number]           Accession #:    [id number] Date of Birth: [birth year]          Patient Gender: F Patient Age:   71 years Exam Location:  [facility] Procedure:      VAS [place] LOWER EXTREMITY VENOUS (DVT) Referring Phys: DAVID ORTIZ --------------------------------------------------------------------------------  Indications: Pulmonary embolism.  Risk Factors: Cancer. Comparison Study: No previous exams Performing Technologist: Jody Hill RVT, RDMS  Examination Guidelines: A complete evaluation includes B-mode imaging, spectral Doppler, color Doppler, and power Doppler as needed of all accessible portions of each vessel. Bilateral testing is considered an integral part of a complete examination. Limited examinations for reoccurring indications may be performed as noted. The reflux portion of the exam is performed with the patient in reverse Trendelenburg.   +---------+---------------+---------+-----------+----------+-------------------+ RIGHT    CompressibilityPhasicitySpontaneityPropertiesThrombus Aging      +---------+---------------+---------+-----------+----------+-------------------+ CFV      Full           Yes      Yes                                      +---------+---------------+---------+-----------+----------+-------------------+ SFJ      Full                                                             +---------+---------------+---------+-----------+----------+-------------------+ FV Prox  Full           Yes      Yes                                      +---------+---------------+---------+-----------+----------+-------------------+ FV Mid   Full           Yes      Yes                                      +---------+---------------+---------+-----------+----------+-------------------+ FV DistalFull           Yes      Yes                                      +---------+---------------+---------+-----------+----------+-------------------+ PFV      Full                                                             +---------+---------------+---------+-----------+----------+-------------------+ POP      Full           Yes      Yes                                      +---------+---------------+---------+-----------+----------+-------------------+ PTV  Full                                                             +---------+---------------+---------+-----------+----------+-------------------+ PERO     Full                                         Not well visualized +---------+---------------+---------+-----------+----------+-------------------+   +---------+---------------+---------+-----------+----------+--------------+ LEFT     CompressibilityPhasicitySpontaneityPropertiesThrombus Aging +---------+---------------+---------+-----------+----------+--------------+ CFV      Full            Yes      Yes                                 +---------+---------------+---------+-----------+----------+--------------+ SFJ      Full                                                        +---------+---------------+---------+-----------+----------+--------------+ FV Prox  Full           Yes      Yes                                 +---------+---------------+---------+-----------+----------+--------------+ FV Mid   Full           Yes      Yes                                 +---------+---------------+---------+-----------+----------+--------------+ FV DistalFull           Yes      Yes                                 +---------+---------------+---------+-----------+----------+--------------+ PFV      Full                                                        +---------+---------------+---------+-----------+----------+--------------+ POP      Full           Yes      Yes                                 +---------+---------------+---------+-----------+----------+--------------+ PTV      Full                                                        +---------+---------------+---------+-----------+----------+--------------+ PERO     Full                                                        +---------+---------------+---------+-----------+----------+--------------+  Summary: BILATERAL: - No evidence of deep vein thrombosis seen in the lower extremities, bilaterally. -No evidence of popliteal cyst, bilaterally.   *See table(s) above for measurements and observations. Electronically signed by Harold Barban MD on 05/13/2022 at 7:20:28 PM.    Final    ECHOCARDIOGRAM COMPLETE  Result Date: 05/13/2022    ECHOCARDIOGRAM REPORT   Patient Name:   NICOLETTE SUITE Date of Exam: 05/13/2022 Medical Rec #:  [id number]          Height:       65.0 in Accession #:    [id number]         Weight:       149.0 lb Date of Birth:  November 09, 1995         BSA:          1.746  m Patient Age:    26 years           BP:           118/76 mmHg Patient Gender: F                  HR:           119 bpm. Exam Location:  Inpatient Procedure: 2D Echo, Color Doppler, Cardiac Doppler and Intracardiac            Opacification Agent Indications:    CHF  History:        Patient has prior history of Echocardiogram examinations, most                 recent 10/18/2021. Breast cancer, Pulmonary Embolus.  Sonographer:    [name] Referring Phys: [id number] [place]  Sonographer Comments: Technically challenging study due to limited acoustic windows. Left breast mass.  FINDINGS  Left Ventricle: Definity contrast agent was given IV to delineate the left ventricular endocardial borders. Very limited images are obtained. The left ventricle is small and "underfilled" with hyperdynamic systolic function. EF is >75%. There is an intracavitary "gradient" of up to 4 m/s due the hyperdynamic state. "Impaired relaxation" pattern of mitral inflow is likely due to poor LV filling. The right heart chambers are not well seen. There is s poor quality subcostal view that suggests that right ventricular function is normal. The inferior vena cava is not dilated. A large pleural effusion is seen, with nodular masses on the plural surface abutting the parietal pericardium. There is probably only a trivial pericardial effusion. Cannot exclude neoplastic infiltration of the pericardium. [name] Electronically signed by [name] Signature Date/Time: 05/13/2022/3:58:10 PM    Final    CT Angio Chest PE W and/or Wo Contrast  Result Date: 05/12/2022 CLINICAL DATA:  Chest pain, shortness of breath. History of metastatic breast cancer. EXAM: CT ANGIOGRAPHY CHEST WITH CONTRAST TECHNIQUE: Multidetector CT imaging of the chest was performed using the standard protocol during bolus administration of intravenous contrast. Multiplanar CT image reconstructions and MIPs were obtained to evaluate the vascular  anatomy. RADIATION DOSE REDUCTION: This exam was performed according to the departmental dose-optimization program which includes automated exposure control, adjustment of the mA and/or kV according to patient size and/or use of iterative reconstruction technique. CONTRAST:  78mL OMNIPAQUE IOHEXOL 350 MG/ML SOLN COMPARISON:  April 29, 2022. FINDINGS: Cardiovascular: Filling defect is noted extending from distal right pulmonary artery into lower lobe branch consistent with pulmonary embolus. Normal cardiac size. No pericardial effusion. Mediastinum/Nodes: Thyroid gland is unremarkable. Esophagus is unremarkable. Continued presence of bilateral axillary  and mediastinal adenopathy is noted consistent with metastatic disease as noted on prior exam. Largest lymph node measures 2.1 cm in right paratracheal region which appears to be enlarged compared to prior exam. Lungs/Pleura: No pneumothorax is noted. Moderate bilateral pleural effusions are noted. Increased interstitial densities are noted throughout both lungs concerning for pulmonary edema. Lymphangitic nodularity is also noted concerning for possible metastatic disease. Upper Abdomen: Heterogeneous appearance of hepatic parenchyma is noted suggesting metastatic disease. Musculoskeletal: Sclerotic metastases are again noted. Review of the MIP images confirms the above findings. IMPRESSION: Linear pulmonary embolus is noted extending from distal portion of right pulmonary artery into lower lobe branches. Critical Value/emergent results were called by telephone at the time of interpretation on 05/12/2022 at 1:44 pm to provider ADAM CURATOLO , who verbally acknowledged these results. Continued presence of bilateral axillary mediastinal adenopathy consistent with metastatic disease. Moderate bilateral pleural effusions are noted. Increased interstitial densities are noted throughout both lungs concerning for pulmonary edema, as well as lymphangitic nodularity concerning  for possible metastatic disease. Heterogeneous appearance of hepatic parenchyma is noted consistent with metastatic disease. Sclerotic osseous metastases are again noted. Electronically Signed   By: Marijo Conception M.D.   On: 05/12/2022 13:44   DG Chest 2 View  Result Date: 05/12/2022 CLINICAL DATA:  Shortness of breath, chest pain EXAM: CHEST - 2 VIEW COMPARISON:  Previous studies including the examination of 05/08/2022 FINDINGS: Cardiac size is within normal limits. Central pulmonary vessels are more prominent. Patchy interstitial and alveolar densities are noted in both lungs with interval worsening. Lateral CP angles are indistinct. There is no pneumothorax. Tip of right IJ central venous catheter is seen in right atrium. IMPRESSION: Central pulmonary vessels are more prominent. There is interval increase in patchy interstitial and alveolar densities in both lungs suggesting worsening of pulmonary edema or worsening of multifocal pneumonia. Small bilateral pleural effusions. There is no pneumothorax. Electronically Signed   By: Elmer Picker M.D.   On: 05/12/2022 12:19        Scheduled Meds:  budesonide (PULMICORT) nebulizer solution  0.5 mg Nebulization BID   Chlorhexidine Gluconate Cloth  6 each Topical Daily   docusate sodium  100 mg Oral Daily   feeding supplement  237 mL Oral BID BM   gabapentin  300 mg Oral TID   levalbuterol  0.63 mg Nebulization Q6H WA   methylPREDNISolone (SOLU-MEDROL) injection  40 mg Intravenous Q12H   pantoprazole  40 mg Oral BID   polyethylene glycol  17 g Oral Daily   venlafaxine XR  225 mg Oral Q breakfast   Continuous Infusions:  heparin 1,100 Units/hr (05/14/22 0949)   piperacillin-tazobactam (ZOSYN)  IV 12.5 mL/hr at 05/14/22 0943   promethazine (PHENERGAN) injection (IM or IVPB) Stopped (05/14/22 0944)     LOS: 2 days    Time spent: [facility], MD [facility]   If 7PM-7AM, please contact  night-coverage [email or web address]  05/14/2022, 10:40 AM

## 2022-05-14 NOTE — Consult Note (Addendum)
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    Ellen [name] tells me that today she is feeling more short of breath than yesterday.  Her mom notes that her oxygen via [place] need has increased to 4-5 liters today.  Her cytology results demonstrate metastatic carcinoma.  She has been slightly more active today than yesterday and has noted fatigue with this.  Her mouth feels much better today as compared to yesterday and she said that food is tasting better.  She has not yet had a bowel movement since the day prior to her admission but she feels like she could go today.  She tells me her pain is managed well with the Dilaudid and morphine.  She continues Gabapentin for the nerve pain caused by her left breast tumor.      Objective:  Vitals:   05/14/22 0600 05/14/22 0823  BP: (!) 143/95   Pulse: 100   Resp: (!) 21   Temp:    SpO2: 97% 99%    Body mass index is 24.79 kg/m.  Intake/Output Summary (Last 24 hours) at 05/14/2022 0832 Last data filed at 05/14/2022 0220 Gross per 24 hour  Intake 1250.31 ml  Output --  Net 1250.31 ml     Sclerae unicteric  Oropharynx + mild thrush over the buccal mucosa and pharynx, no other lesions  No cervical or supraclavicular adenopathy  Lungs diminished at bases bilaterally slightly worse on left compared to right  Heart regular rate and rhythm  Abdomen soft, +BS  Neuro nonfocal     Labs:  Lab Results  Component Value Date   WBC 12.3 (H) 05/14/2022   HGB 11.7 (L) 05/14/2022   HCT 36.8 05/14/2022   MCV 90.0 05/14/2022   PLT 320 05/14/2022   NEUTROABS 10.6 (H) [id number]    Basic Metabolic Panel: Recent Labs  Lab 05/07/22 1550 05/08/22 0523 05/12/22 1131 05/13/22 0329  NA 138 135 133* 130*  K 4.2 4.4 3.8 3.8  CL 105 105 98 94*  CO2 23  23 29 27   GLUCOSE 86 81 106* 109*  BUN 6 8 7 9   CREATININE 0.65 0.65 0.48 0.66  CALCIUM 9.2 8.0* 8.5* 7.5*   GFR Estimated Creatinine Clearance: 95 mL/min (by C-G formula based on SCr of 0.66 mg/dL). Liver Function Tests: Recent Labs  Lab 05/07/22 1550 05/13/22 0329  AST 133* 210*  ALT 98* 93*  ALKPHOS 162* 306*  BILITOT 0.4 1.2  PROT 6.6 6.1*  ALBUMIN 3.7 2.5*     Assessment/Plan: 27 y.o.  Metastatic breast cancer, progressed on multiple lines of previous therapy.  Cytology from pleural fluid is positive for metastatic carcinoma.  Prognostic panel is pending and will likely result tomorrow.  Salvage therapies can be considered after weighing risk of side effects versus potential benefit Pleural effusion: Plan for pleurx placement tomorrow at 1pm.   Pulmonary Embolus: ok to transition to oral anticoagulants once team is ready.  Pain management: Continue dilaudid and morphine IV PRN along with Gabapentin for her left breast tumor neuropathic pain.  Shortness of breath: continuing on oxygen, nebulizer, and steroids.  We discussed her symptom burden and goals of care.  In the instance that the pleural fluid is drained and she remains short of breath, she is leaning towards hospice for comfort in her  dyspnea.  If her dyspnea is improved after drainage, therapy can be considered. Thrush: Diflucan added.   Prophylaxis: On PPI, bowel regimen Other medical issues per hospitalist team.  Thank you [facility] and [facility] for the great care you are providing our mutual patient.   Code Status: DNR LOS: [place], NP 05/14/2022  8:32 AM Medical Oncology and Hematology [facility] [address] [place], [place] [address] Tel. [phone]    Fax. [phone]  Ellen [name] was interviewed and examined.  She appears unchanged.  She reports improvement in her breathing with bronchodilator therapy.  Pain is improved with Dilaudid and gabapentin.  She has oral  candidiasis.  She will begin a course of Diflucan.  The dyspnea/hypoxia is likely in large part related to lymphangitic tumor spread in the lungs.  Treatment options are limited.  I agree with continuing oxygen, steroids, and narcotics/anxiolytics.  She will need home oxygen therapy.  The left pleural fluid cytology returned positive for metastatic breast cancer, and ER staining was negative.  A full breast prognostic profile is pending.  She may be a candidate for a trial of salvage systemic chemotherapy versus a targeted therapy pending the breast prognostic profile and further discussion with Dr. Lindi Adie.  Home hospice care is appropriate.  Julieanne Manson, MD

## 2022-05-14 NOTE — TOC Progression Note (Signed)
Transition of Care [facility]) - Progression Note    Patient Details  Name: Ellen [name] MRN: [id number] Date of Birth: [birth year]  [facility] CM/SW Contact  [place] Haub, Juliann Pulse, RN Phone Number: 05/14/2022, 11:31 AM  Clinical Narrative:  Received message from [facility] NP to f/u on d/c plans-HHC,pleurx cannisters coverage. Unable to accept for HHC-[facility];awaiting [facility] rep Kasi to assess if able to accept for HHRN-teaching, pleurx cath drainage,& ongoing f/u on [facility]fusion pleurx cannisters. Upon review with [facility] agencies-pleurx catheters are provided under [place] benefit-once d/c from the hospital ongoing care is now under [facility] agency.Left vm w/patient's mother for call back.Will continue to monitor for d/c plans.Will update TOC covering.      Barriers to Discharge: Continued Medical Work up  Expected Discharge Plan and Services   Discharge Planning Services: CM Consult Post Acute Care Choice: [place] arrangements for the past 2 months: Single Family Home                 DME Arranged: Oxygen                     Social Determinants of Health (SDOH) Interventions SDOH Screenings   Food Insecurity: No Food Insecurity (05/12/2022)  Housing: Low Risk  (05/12/2022)  Transportation Needs: No Transportation Needs (05/12/2022)  Utilities: Not At Risk (05/12/2022)  Financial Resource Strain: High Risk (05/08/2021)  Tobacco Use: Low Risk  (05/12/2022)    Readmission Risk Interventions     No data to display

## 2022-05-14 NOTE — TOC Progression Note (Signed)
Transition of Care [facility]) - Progression Note    Patient Details  Name: Ellen [name] MRN: [id number] Date of Birth: [birth year]  Transition of Care [facility]) CM/SW Contact  [place] Macomber, Juliann Pulse, RN Phone Number: 05/14/2022, 12:35 PM  Clinical Narrative: [facility] [place] for HHRN-pleurx cath.      Expected Discharge Plan: [place] Barriers to Discharge: Continued Medical Work up  Expected Discharge Plan and Services   Discharge Planning Services: CM Consult Post Acute Care Choice: [place] arrangements for the past 2 months: Single Family Home                 DME Arranged: Oxygen         HH Arranged: RN HH Agency: Other - See comment ([facility] HH rep Kasi) Date [place]: 05/14/22 Time [place]: [id number] Representative spoke with at [place]: Kasi   Social Determinants of Health ([place]) Interventions SDOH Screenings   Food Insecurity: No Food Insecurity (05/12/2022)  Housing: Low Risk  (05/12/2022)  Transportation Needs: No Transportation Needs (05/12/2022)  Utilities: Not At Risk (05/12/2022)  Financial Resource Strain: High Risk (05/08/2021)  Tobacco Use: Low Risk  (05/12/2022)    Readmission Risk Interventions     No data to display

## 2022-05-15 ENCOUNTER — Encounter: Payer: Self-pay | Admitting: General Practice

## 2022-05-15 ENCOUNTER — Inpatient Hospital Stay (HOSPITAL_COMMUNITY): Payer: Medicaid Other

## 2022-05-15 ENCOUNTER — Ambulatory Visit: Payer: Medicaid Other

## 2022-05-15 ENCOUNTER — Inpatient Hospital Stay: Payer: Medicaid Other | Admitting: Adult Health

## 2022-05-15 DIAGNOSIS — Z7189 Other specified counseling: Secondary | ICD-10-CM | POA: Diagnosis not present

## 2022-05-15 DIAGNOSIS — Z66 Do not resuscitate: Secondary | ICD-10-CM | POA: Diagnosis not present

## 2022-05-15 DIAGNOSIS — J90 Pleural effusion, not elsewhere classified: Secondary | ICD-10-CM | POA: Diagnosis not present

## 2022-05-15 DIAGNOSIS — C50919 Malignant neoplasm of unspecified site of unspecified female breast: Secondary | ICD-10-CM | POA: Diagnosis not present

## 2022-05-15 DIAGNOSIS — J9601 Acute respiratory failure with hypoxia: Secondary | ICD-10-CM | POA: Diagnosis not present

## 2022-05-15 DIAGNOSIS — Z515 Encounter for palliative care: Secondary | ICD-10-CM

## 2022-05-15 DIAGNOSIS — I2699 Other pulmonary embolism without acute cor pulmonale: Secondary | ICD-10-CM | POA: Diagnosis not present

## 2022-05-15 LAB — CBC
HCT: 35 % — ABNORMAL LOW (ref 36.0–46.0)
Hemoglobin: 11.1 g/dL — ABNORMAL LOW (ref 12.0–15.0)
MCH: 28.7 pg (ref 26.0–34.0)
MCHC: 31.7 g/dL (ref 30.0–36.0)
MCV: 90.4 fL (ref 80.0–100.0)
Platelets: 279 10*3/uL (ref 150–400)
RBC: 3.87 MIL/uL (ref 3.87–5.11)
RDW: 15.3 % (ref 11.5–15.5)
WBC: 20.3 10*3/uL — ABNORMAL HIGH (ref 4.0–10.5)
nRBC: 0 % (ref 0.0–0.2)

## 2022-05-15 LAB — HEPARIN LEVEL (UNFRACTIONATED): Heparin Unfractionated: 0.43 IU/mL (ref 0.30–0.70)

## 2022-05-15 MED ORDER — HYDROMORPHONE HCL-NACL 50-0.9 MG/50ML-% IV SOLN
0.5000 mg/h | INTRAVENOUS | Status: DC
  Administered 2022-05-15: 0.5 mg/h via INTRAVENOUS
  Filled 2022-05-15: qty 50

## 2022-05-15 MED ORDER — LEVALBUTEROL HCL 0.63 MG/3ML IN NEBU: 0.6300 mg | INHALATION_SOLUTION | Freq: Four times a day (QID) | RESPIRATORY_TRACT | Status: DC | PRN

## 2022-05-15 MED ORDER — APIXABAN 5 MG PO TABS
10.0000 mg | ORAL_TABLET | Freq: Two times a day (BID) | ORAL | Status: DC
  Administered 2022-05-15: 10 mg via ORAL
  Filled 2022-05-15: qty 2

## 2022-05-15 MED ORDER — LEVALBUTEROL HCL 0.63 MG/3ML IN NEBU: 0.6300 mg | INHALATION_SOLUTION | Freq: Three times a day (TID) | RESPIRATORY_TRACT | Status: DC

## 2022-05-15 MED ORDER — LORAZEPAM 2 MG/ML IJ SOLN
1.0000 mg | INTRAMUSCULAR | Status: DC
  Administered 2022-05-15 – 2022-05-16 (×5): 1 mg via INTRAVENOUS
  Filled 2022-05-15 (×4): qty 1

## 2022-05-15 MED ORDER — APIXABAN 5 MG PO TABS: 5.0000 mg | ORAL_TABLET | Freq: Two times a day (BID) | ORAL | Status: DC

## 2022-05-15 MED ORDER — LIDOCAINE HCL 1 % IJ SOLN
10.0000 mL | Freq: Once | INTRAMUSCULAR | Status: AC
  Administered 2022-05-15: 10 mL via INTRADERMAL
  Filled 2022-05-15: qty 20
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
## 2022-05-15 NOTE — Progress Notes (Signed)
PROGRESS NOTE    Ellen [name]  [id number] DOB: 1995-08-15 DOA: 05/12/2022 PCP: Patient, No Pcp Per   Brief Narrative: 27 year old with past medical history significant for ADHD, anemia, anxiety, exercise-induced asthma, depression, herpes simplex, GERD, stage IV breast cancer with osseous metastasis, diagnosed 2022, recently discharged 3/22 for acute respiratory failure in the setting of left pleural malignant effusion underwent thoracentesis and discharged home on oxygen.  Patient presented with worsening shortness of breath.  CT chest angio positive for PE, moderate bilateral pleural effusion increasing side.  Increase density.ATTE throughout bothST 1 concerning for pulmonary edema, lymphangitic nodularity concerning for possible metastatic disease.  05/14/2022: Patient seen.  Pleural fluid analysis revealed malignant cells.  Pleurx catheter will be placed by the ICU team tomorrow.  Hopefully, patient will be discharged back home on Friday with home health nursing.  Patient was seen alongside patient's mother.  No new complaints.  05/15/2022: Patient seen alongside patient's mom.  For Pleurx catheter placement today.  Likely discharge home tomorrow with home health nurse.    Assessment & Plan:   Principal Problem:   Pulmonary embolus (HCC) Active Problems:   Depression   GAD (generalized anxiety disorder)   Primary malignant neoplasm of breast with metastasis (HCC)   Acute respiratory failure with hypoxia (HCC)   Pleural effusion   Bilateral pleural effusion   DNR (do not resuscitate)   Neoplasm related pain   Goals of care, counseling/discussion   Palliative care encounter   1-Acute Hypoxic Respiratory Failure:  In setting of pleural effusion, PE, increase density BL.  -Start IV antibiotics, Zosyn, pro-calcitonin elevated.  -IV steroids to cover for Pneumonitis.  -Pulmonary consulted, plan is to proceed with thoracentesis prior to pleurex catheter. Pulmonologist  consulted.  -Resume Nebulizer.  -ECHO ordered.  -Cytology from prior thoracentesis, not available.  05/14/2022: Resolved significantly.  Malignant pleural effusion is noted.  For Pleurx catheter placement tomorrow.  Likely discharge in 2 days time. 05/15/2022: For Pleurx catheter placement.  Likely discharge tomorrow.  2-PE;  Continue with heparin Gtt.  05/14/2022: Heparin drip will be held tomorrow prior to Pleurx catheter placement.  Likely, patient will be discharged on Eliquis.  3-Metastatic Breast Cancer stage IV;  -Dr Benay Spice will see patient in consultation.  -Palliative consulted to assist with pain, symptoms management.  05/14/2022: Oncology team is directing care.  4-Generalized Anxiety Disorder:  Continue with Lorazepam and Effexor.   5-Epigastric pain; start IV Protonix.  05/14/2022: Resolved significantly.  6-Transaminases; Numerous hypodense liver lesion. Concern for metastatic diseases.  She was suppose to have Liver Bx 4/01.    Estimated body mass index is 24.79 kg/m as calculated from the following:   Height as of this encounter: 5\' 5"  (1.651 m).   Weight as of this encounter: 67.6 kg.   DVT prophylaxis: Heparin Gtt Code Status: DNR Family Communication: Mother at bedside.  Disposition Plan:  Status is: Inpatient Remains inpatient appropriate because: management of Respiratory failure    Consultants:  [facility] Oncology Palliative  Procedures:  ECHO Thoracentesis.   Antimicrobials:    Subjective: No new complaints. No fever or chills. Pain is controlled.  Objective: Vitals:   05/15/22 0940 05/15/22 1140 05/15/22 1200 05/15/22 1433  BP: (!) 158/92  (!) 139/92   Pulse: (!) 133  (!) 125   Resp: 19  11   Temp:  (!) 97.3 F (36.3 C)    TempSrc:  Axillary    SpO2: 96%  98% 97%  Weight:      Height:  Intake/Output Summary (Last 24 hours) at 05/15/2022 1535 Last data filed at 05/15/2022 1503 Gross per 24 hour  Intake 324.53 ml  Output  2200 ml  Net -1875.47 ml    Filed Weights   05/12/22 1136  Weight: 67.6 kg    Examination:  General exam: NAD Lungs; decreased air entry..  Cardiovascular system: S1 & S2 heard, Gastrointestinal system: Abdomen soft, tender.  Central nervous system: Alert and oriented.  Extremities; no edema  Data Reviewed: I have personally reviewed following labs and imaging studies  CBC: Recent Labs  Lab 05/12/22 1131 05/13/22 0329 05/14/22 0334 05/15/22 0254  WBC 13.0* 14.9* 12.3* 20.3*  NEUTROABS 10.6*  --   --   --   HGB 12.6 12.4 11.7* 11.1*  HCT 38.6 39.0 36.8 35.0*  MCV 87.7 90.3 90.0 90.4  PLT 315 320 320 [id number]    Basic Metabolic Panel: Recent Labs  Lab 05/12/22 1131 05/13/22 0329 05/14/22 1757  NA 133* 130* 130*  K 3.8 3.8 4.0  CL 98 94* 95*  CO2 29 27 26   GLUCOSE 106* 109* 130*  BUN 7 9 11   CREATININE 0.48 0.66 0.59  CALCIUM 8.5* 7.5* 7.1*    GFR: Estimated Creatinine Clearance: 95 mL/min (by C-G formula based on SCr of 0.59 mg/dL). Liver Function Tests: Recent Labs  Lab 05/13/22 0329  AST 210*  ALT 93*  ALKPHOS 306*  BILITOT 1.2  PROT 6.1*  ALBUMIN 2.5*    No results for input(s): "LIPASE", "AMYLASE" in the last 168 hours.  No results for input(s): "AMMONIA" in the last 168 hours. Coagulation Profile: Recent Labs  Lab 05/13/22 0329  INR 1.3*    Cardiac Enzymes: No results for input(s): "CKTOTAL", "CKMB", "CKMBINDEX", "TROPONINI" in the last 168 hours. BNP (last 3 results) No results for input(s): "PROBNP" in the last 8760 hours. HbA1C: No results for input(s): "HGBA1C" in the last 72 hours. CBG: No results for input(s): "GLUCAP" in the last 168 hours. Lipid Profile: No results for input(s): "CHOL", "HDL", "LDLCALC", "TRIG", "CHOLHDL", "LDLDIRECT" in the last 72 hours. Thyroid Function Tests: No results for input(s): "TSH", "T4TOTAL", "FREET4", "T3FREE", "THYROIDAB" in the last 72 hours. Anemia Panel: No results for input(s):  "VITAMINB12", "FOLATE", "FERRITIN", "TIBC", "IRON", "RETICCTPCT" in the last 72 hours. Sepsis Labs: Recent Labs  Lab 05/13/22 1021  PROCALCITON 1.36     Recent Results (from the past 240 hour(s))  Resp panel by RT-PCR (RSV, Flu A&B, Covid) Anterior Nasal Swab     Status: None   Collection Time: 05/07/22  3:39 PM   Specimen: Anterior Nasal Swab  Result Value Ref Range Status   SARS Coronavirus 2 by RT PCR NEGATIVE NEGATIVE Final    Comment: (NOTE) SARS-CoV-2 target nucleic acids are NOT DETECTED.  The SARS-CoV-2 RNA is generally detectable in upper respiratory specimens during the acute phase of infection. The lowest concentration of SARS-CoV-2 viral copies this assay can detect is 138 copies/mL. A negative result does not preclude SARS-Cov-2 infection and should not be used as the sole basis for treatment or other patient management decisions. A negative result may occur with  improper specimen collection/handling, submission of specimen other than nasopharyngeal swab, presence of viral mutation(s) within the areas targeted by this assay, and inadequate number of viral copies(<138 copies/mL). A negative result must be combined with clinical observations, patient history, and epidemiological information. The expected result is Negative.  Fact Sheet for Patients:  EntrepreneurPulse.com.au  Fact Sheet for Healthcare Providers:  IncredibleEmployment.be  This test  is no t yet approved or cleared by the [place] and  has been authorized for detection and/or diagnosis of SARS-CoV-2 by FDA under an Emergency Use Authorization (EUA). This EUA will remain  in effect (meaning this test can be used) for the duration of the COVID-19 declaration under Section 564(b)(1) of the Act, 21 U.S.C.section 360bbb-3(b)(1), unless the authorization is terminated  or revoked sooner.       Influenza A by PCR NEGATIVE NEGATIVE Final   Influenza B by PCR  NEGATIVE NEGATIVE Final    Comment: (NOTE) The Xpert Xpress SARS-CoV-2/FLU/RSV plus assay is intended as an aid in the diagnosis of influenza from Nasopharyngeal swab specimens and should not be used as a sole basis for treatment. Nasal washings and aspirates are unacceptable for Xpert Xpress SARS-CoV-2/FLU/RSV testing.  Fact Sheet for Patients: EntrepreneurPulse.com.au  Fact Sheet for Healthcare Providers: IncredibleEmployment.be  This test is not yet approved or cleared by the [place] FDA and has been authorized for detection and/or diagnosis of SARS-CoV-2 by FDA under an Emergency Use Authorization (EUA). This EUA will remain in effect (meaning this test can be used) for the duration of the COVID-19 declaration under Section 564(b)(1) of the Act, 21 U.S.C. section 360bbb-3(b)(1), unless the authorization is terminated or revoked.     Resp Syncytial Virus by PCR NEGATIVE NEGATIVE Final    Comment: (NOTE) Fact Sheet for Patients: EntrepreneurPulse.com.au  Fact Sheet for Healthcare Providers: IncredibleEmployment.be  This test is not yet approved or cleared by the [place] FDA and has been authorized for detection and/or diagnosis of SARS-CoV-2 by FDA under an Emergency Use Authorization (EUA). This EUA will remain in effect (meaning this test can be used) for the duration of the COVID-19 declaration under Section 564(b)(1) of the Act, 21 U.S.C. section 360bbb-3(b)(1), unless the authorization is terminated or revoked.  Performed at [facility], [address] [place], [place] [address]   Body fluid culture w Gram Stain     Status: None   Collection Time: 05/08/22 11:13 AM   Specimen: PATH Cytology Pleural fluid  Result Value Ref Range Status   Specimen Description   Final    PLEURAL Performed at [facility] [address] [place], [place]  [address]    Special Requests   Final    NONE Performed at [facility], [place] [address] [place], [place] [address]    Gram Stain   Final    RARE WBC PRESENT, PREDOMINANTLY MONONUCLEAR NO ORGANISMS SEEN    Culture   Final    NO GROWTH 3 DAYS Performed at [facility], [place] [address] [place], [place] [address]    Report Status 05/12/2022 FINAL  Final  MRSA Next Gen by PCR, Nasal     Status: None   Collection Time: 05/12/22  4:04 PM   Specimen: Nasal Mucosa; Nasal Swab  Result Value Ref Range Status   MRSA by PCR Next Gen NOT DETECTED NOT DETECTED Final    Comment: (NOTE) The GeneXpert MRSA Assay (FDA approved for NASAL specimens only), is one component of a comprehensive MRSA colonization surveillance program. It is not intended to diagnose MRSA infection nor to guide or monitor treatment for MRSA infections. Test performance is not FDA approved in patients less than 66 years old. Performed at [facility], [place] [address], [place], [place] [address]          Radiology Studies: DG CHEST PORT 1 VIEW  Result Date: 05/15/2022 CLINICAL DATA:  Chest tube placement EXAM: PORTABLE CHEST 1 VIEW COMPARISON:  05/14/2022 FINDINGS: Interval placement of left chest tube, with tip in the left apex. Redemonstrated right chest port with tip approximating the right atrium. Cardiac size is within normal limits. Redemonstrated bilateral patchy alveolar opacities, which appears slightly improved in the left lower lung. No definite pleural effusion. Possible trace left apical pneumothorax. No acute osseous abnormality. IMPRESSION: 1. Interval placement of left chest tube, with tip in the left apex, with possible trace left apical pneumothorax. 2. Redemonstrated bilateral patchy alveolar opacities, which may represent pulmonary edema, lymphangitic carcinomatosis, and/or multifocal pneumonia. This appears slightly improved in the left lower lung. Electronically  Signed   By: Merilyn Baba M.D.   On: 05/15/2022 13:28   DG CHEST PORT 1 VIEW  Result Date: 05/14/2022 CLINICAL DATA:  Respiratory failure EXAM: PORTABLE CHEST 1 VIEW COMPARISON:  Previous studies including the examination of 05/12/2022 FINDINGS: Cardiac size is within normal limits. Extensive patchy alveolar densities are seen throughout both lungs with interval worsening. There is interval increase in size of small to moderate left pleural effusion. Right lateral costophrenic angle is indistinct suggesting small effusion. There is no pneumothorax. Tip of right IJ chest port is seen in region of right atrium. IMPRESSION: Extensive diffuse interstitial and alveolar densities are noted in both lungs with interval worsening. Findings suggest interval worsening of pulmonary edema and possibly interval worsening of multifocal pneumonia. There is increase in amount of pleural effusions, more so on the left side. Electronically Signed   By: Elmer Picker M.D.   On: 05/14/2022 18:49        Scheduled Meds:  apixaban  10 mg Oral BID   Followed by   Derrill Memo ON 05/22/2022] apixaban  5 mg Oral BID   budesonide (PULMICORT) nebulizer solution  0.5 mg Nebulization BID   Chlorhexidine Gluconate Cloth  6 each Topical Daily   docusate sodium  100 mg Oral Daily   feeding supplement  237 mL Oral BID BM   fluconazole  100 mg Oral Daily   gabapentin  300 mg Oral TID   levalbuterol  0.63 mg Nebulization Q6H WA   LORazepam  1 mg Intravenous Q4H   methylPREDNISolone (SOLU-MEDROL) injection  40 mg Intravenous Q12H   pantoprazole  40 mg Oral BID   polyethylene glycol  17 g Oral Daily   venlafaxine XR  225 mg Oral Q breakfast   Continuous Infusions:  piperacillin-tazobactam (ZOSYN)  IV 3.375 g (05/15/22 1340)   promethazine (PHENERGAN) injection (IM or IVPB) Stopped (05/15/22 1035)     LOS: 3 days    Time spent: 46 Minutes    Bonnell Public, MD [facility]   If 7PM-7AM, please contact  night-coverage [email or web address]  05/15/2022, 3:35 PM

## 2022-05-15 NOTE — IPAL (Signed)
  Interdisciplinary Goals of Care Family Meeting   Date carried out: 05/15/2022  Location of the meeting: Unit  Member's involved: Physician, Bedside Registered Nurse, and Family Member or next of kin  Durable Power of Attorney or acting medical decision maker: Jannetta Quint  Discussion: We discussed goals of care for [employer] .  Patient's mother and I spoke about her daughter's clinical status. She has had significant decline over the past 48 hours. She is showing signs of respiratory distress due to progressive lymphangitic spread in her lungs. Her oxygen requirements are increasing as well. She is receiving q1hr pain medications. The mother feels this is too much for her and family to manage at home. Patient feels comfortable here with less anxiety. She enjoys the nursing staff.   Given patient's needs and mother's requests, we will plan to transition her to comfort care here in the ICU. We will start low dose dilaudid drip and continue PRN dilaudid pushes, PRN morphine pushes and PRN ativan.    Code status:   Code Status: DNR   Disposition: In-patient comfort care  Time spent for the meeting: 15 minutes    Freddi Starr, MD  05/15/2022, 6:01 PM

## 2022-05-15 NOTE — Progress Notes (Signed)
[facility] Spiritual Care Note  Attempted follow-up visit, but Juline was receiving a sterile procedure.  Visited with her mom, aunt Jackelyn Poling, cousin Cristie Hem, and close friend Cristie Hem in the waiting room, providing pastoral presence, reflective listening, normalization of feelings, affirmation of strengths, and emotional support as they shared and processed updates and conversations they have had with Delain about her priority of quality over quantity of life.  Family verbalized great appreciation for [facility] whole team and is aware of ongoing chaplain availability. Continuing to follow for support.   [place], [place], [facility] Pager [phone] Voicemail [phone]

## 2022-05-15 NOTE — Procedures (Signed)
PleurX Insertion Procedure Note  GETHSEMANE HERT  EB:[id number]  1995-10-07  Date:05/15/22  Time:12:44 PM   Provider Performing:Markeya Mincy B Erin Fulling  Procedure: PleurX Tunneled Pleural Catheter Placement ([id number])  Indication(s) Relief of dyspnea from recurrent effusion  Consent Risks of the procedure as well as the alternatives and risks of each were explained to the patient and/or caregiver.  Consent for the procedure was obtained.   Anesthesia Topical only with 1% lidocaine    Time Out Verified patient identification, verified procedure, site/side was marked, verified correct patient position, special equipment/implants available, medications/allergies/relevant history reviewed, required imaging and test results available.   Sterile Technique Maximal sterile technique including sterile barrier drape, hand hygiene, sterile gown, sterile gloves, mask, hair covering.    Procedure Description Ultrasound used to identify appropriate pleural anatomy for placement and overlying skin marked.  Area of drainage cleaned and draped in sterile fashion.   Lidocaine was used to anesthetize the skin and subcutaneous tissue.   1.5 cm incision made overlying fluid and another about 5 cm anterior to this along chest wall.  PleurX catheter inserted in usual sterile fashion using modified seldinger technique.  Interrupted silk sutures placed at catheter insertion and tunneling points which will be removed at later date.  PleurX catheter then hooked to suction.  After fluid aspirated, pleurX capped and sterile dressing applied.   Complications/Tolerance None; patient tolerated the procedure well. Chest X-ray is ordered to confirm no post-procedural complication.   EBL Minimal   Specimen(s) none

## 2022-05-15 NOTE — Progress Notes (Signed)
Came by to follow-up on patient. Currently undergoing bedside PluerX placement (sterile procedure). Will plan to follow-up at later time and offer support.    Alda Lea, AGPCNP-BC  Palliative Medicine Team/[place]

## 2022-05-15 NOTE — Consult Note (Addendum)
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  Yesterday evening when [name] returned from radiation she was more tachypneic and have more labored breathing.  At that time [name] underwent a portable chest x-ray which showed worsening in the lungs some related to pleural effusions some related to the lymphangitic spread of her metastatic breast cancer.    She has been receiving Dilaudid IV alternating with morphine IV which has helped with her shortness of breath.  She underwent Pleurx placement around 11 AM and tolerated this well.  She has been sleeping a lot today since placement.    She has been on 6L of oxygen since yesterday evening and her saturation has been around 94-98%.  Cytology results have been addended on her pleural fluid from her thoracentesis last week.  In addition to it demonstrating metastatic breast cancer the cells are estrogen negative, progesterone negative and HER2 2+ (equivocal).  FISH was sent and is currently pending.    Objective:  Vitals:   05/15/22 0750 05/15/22 0940  BP:  (!) 158/92  Pulse:  (!) 133  Resp:  19  Temp: 98.2 F (36.8 C)   SpO2:  96%    Body mass index is 24.79 kg/m.  Intake/Output Summary (Last 24 hours) at 05/15/2022 1112 Last data filed at 05/15/2022 0845 Gross per 24 hour  Intake 324.53 ml  Output 800 ml  Net -475.47 ml     Sclerae unicteric  Oropharynx shows improvement in thrush  No cervical or supraclavicular adenopathy  Lungs, diminished in bases, + wheezing   Hear tachy rate, regular rhythm  Abdomen soft, +BS  Neuro: responds to commands, though sleepy (recent pleurx placement)     Labs:  Lab Results  Component Value Date   WBC 20.3 (H) 05/15/2022   HGB 11.1 (L) 05/15/2022    HCT 35.0 (L) 05/15/2022   MCV 90.4 05/15/2022   PLT 279 05/15/2022   NEUTROABS 10.6 (H) [id number]    Basic Metabolic Panel: Recent Labs  Lab 05/12/22 1131 05/13/22 0329 05/14/22 1757  NA 133* 130* 130*  K 3.8 3.8 4.0  CL 98 94* 95*  CO2 29 27 26   GLUCOSE 106* 109* 130*  BUN 7 9 11   CREATININE 0.48 0.66 0.59  CALCIUM 8.5* 7.5* 7.1*   Liver Function Tests: Recent Labs  Lab 05/13/22 0329  AST 210*  ALT 93*  ALKPHOS 306*  BILITOT 1.2  PROT 6.1*  ALBUMIN 2.5*   Studies:  DG CHEST PORT 1 VIEW  Result Date: 05/14/2022 CLINICAL DATA:  Respiratory failure EXAM: PORTABLE CHEST 1 VIEW COMPARISON:  Previous studies including the examination of 05/12/2022 FINDINGS: Cardiac size is within normal limits. Extensive patchy alveolar densities are seen throughout both lungs with interval worsening. There is interval increase in size of small to moderate left pleural effusion. Right lateral costophrenic angle is indistinct suggesting small effusion. There is no pneumothorax. Tip of right IJ chest port is seen in region of right atrium. IMPRESSION: Extensive diffuse interstitial and alveolar densities are noted in both lungs with interval worsening. Findings suggest interval worsening of pulmonary edema and possibly interval worsening of multifocal pneumonia. There is increase in amount of pleural effusions, more so on the left side. Electronically Signed   By: Elmer Picker M.D.   On: 05/14/2022  18:49   ECHOCARDIOGRAM COMPLETE  Result Date: 05/13/2022    ECHOCARDIOGRAM REPORT   Patient Name:   Ellen [name] Date of Exam: 05/13/2022 Medical Rec #:  [id number]          Height:       65.0 in Accession #:    [id number]         Weight:       149.0 lb Date of Birth:  [birth year]         BSA:          1.746 m Patient Age:    26 years           BP:           118/76 mmHg Patient Gender: F                  HR:           119 bpm. Exam Location:  Inpatient Procedure: 2D Echo, Color Doppler, Cardiac  Doppler and Intracardiac            Opacification Agent Indications:    CHF  History:        Patient has prior history of Echocardiogram examinations, most                 recent 10/18/2021. Breast cancer, Pulmonary Embolus.  Sonographer:    [name] Referring Phys: [id number] [place]  Sonographer Comments: Technically challenging study due to limited acoustic windows. Left breast mass.  FINDINGS  Left Ventricle: Definity contrast agent was given IV to delineate the left ventricular endocardial borders. Very limited images are obtained. The left ventricle is small and "underfilled" with hyperdynamic systolic function. EF is >75%. There is an intracavitary "gradient" of up to 4 m/s due the hyperdynamic state. "Impaired relaxation" pattern of mitral inflow is likely due to poor LV filling. The right heart chambers are not well seen. There is s poor quality subcostal view that suggests that right ventricular function is normal. The inferior vena cava is not dilated. A large pleural effusion is seen, with nodular masses on the plural surface abutting the parietal pericardium. There is probably only a trivial pericardial effusion. Cannot exclude neoplastic infiltration of the pericardium. [name] Electronically signed by [name] Signature Date/Time: 05/13/2022/3:58:10 PM    Final     Assessment/Plan: 27 y.o.  Metastatic breast cancer: Cytology from pleural fluid demonstrates metastatic breast cancer ER/PR negative, HER2 2+ with FISH results pending.  Considering her lung status and poor prognosis it is unlikely that any salvage therapy will be possible. No further radiation planned.   Pleural effusion: s/p pleurx placement Pulmonary embolus: on Eliquis BID Pain management: Morphine IV alternating with Dilaudid IV, tolerating well.  Gabapentin BID for neuropathic breast pain from her tumor.  Shortness of breath: Continuing on Zosyn, Steroids, nebulizer.  Her oxygen demand is high, if  it doesn't improve, will likely need to pursue comfort measures.  End of life planning: DNR and advance care planning in place.  We are working with Lexine Baton NP in palliative care who is assisting with discharge planning.   Prophylaxis: Colace, Protonix Other medical issues per hospitalist team.  Thank you to [facility] and [facility]ology for the great care you are providing our mutual patient.  CODE STATUS: DNR LOS: [place], NP 05/15/2022  11:12 AM Medical Oncology and Hematology [facility] [address] [place], [place] [address] Tel. [phone]  Fax. [phone] Ms. Ohagan was interviewed and examined.  Her mother was at the bedside when I saw her at approximately 7:30 AM.  I discussed the case with the critical care service.  She underwent placement of a palliative Pleurx catheter this morning.  She continues to require high flow nasal cannula oxygen support.  I suspect the dyspnea/hypoxia is largely related to lymphangitic tumor spread in the lungs.  I discussed the situation with Ms. Preston and her mother.  She is unlikely to gain significant benefit from salvage chemotherapy.  I recommend continuing comfort care measures.  She will transition to apixaban for treatment of the pulmonary embolism.  The breast prognostic profile on the left pleural fluid returned ER negative, PR negative, Ki-67 20%, and HER2 2+  She appears to be a candidate for residential hospice versus home hospice (if arrangements can be made for high flow oxygen support and intravenous narcotics)  Julieanne Manson, MD

## 2022-05-15 NOTE — Progress Notes (Signed)
NAME:  Ellen [name], MRN:  [id number], DOB:  [birth year], LOS: 3 ADMISSION DATE:  05/12/2022, CONSULTATION DATE:  3/26 REFERRING MD:  Dr. Tyrell Antonio, CHIEF COMPLAINT:  SOB   History of Present Illness:  27 year old female who presented to [facility] hospital ER on 3/25 with reports of shortness of breath.  She unfortunately carries a history of stage IV breast cancer.  She was initially diagnosed in June 2022.  At that time she had lymph node, rib, thoracic spine, liver metastasis.  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  She underwent palliative radiation to the lumbar spine and pelvic lesions.  Her initial chemotherapy regimen was Herceptin/Perjeta and Taxotere.  She had complications of refractory diarrhea requiring hospitalization requiring therapy adjustment.  Her genetic testing was negative. She is followed by Dr. Lindi Adie in [place].   The patient was recently evaluated (05/05/2022) at [facility].  Review of the visit from [place] shows the following: The patient most recently has been on Sacituzumab but it is felt by her care team that this is no longer working.  In addition she has been on carboplatin and gemcitabine.  She has never had immunotherapy as her PD-L1 was negative.  Despite therapy the patient had noted progression of her breast tumor and thickening of the skin.  She had ongoing discomfort despite taking gabapentin.  She has also received radiation to her lower spine but not her chest wall.  In December 2023 the patient began having difficulties with breathing.  Her scans at that time showed possible mild lung involvement with patchy areas throughout.  She had a viral illness at the end of December which may have triggered an asthma exacerbation.  She was treated with her albuterol, steroid inhaler, Medrol Dosepak and Z-Pak.  The patient reported  approximately 85% improvement after completion.  Recommendations from the [facility] breast cancer clinic included performing a liver biopsy with repeat biomarker testing, cough control, and transition to a regimen that will treat both triple negative and HER2 positive clone such as HER2 CLIMB regimen or eribulin with margetuximab.    The patient is hopeful to take a trip to [place] in [place] from 4/16 through 4/30.   In December 2023, CT imaging of chest showed no effusion & parenchyma was largely unremarkable.  On April 29, 2022 CT showed significant increase in intralobular septal thickening & new moderate left pleural effusion.  After her [facility] visit she underwent a thoracentesis 3/21 with 1.8L obtained with intent to send for cell block for Her2 testing to determine next steps. She was planned for liver biopsy and repeat thoracentesis on 05/19/22.  However, the patient presented to [facility] ER on 3/25 with reports of tightness in her upper abdomen, nausea, 1 episode of vomiting & shortness of breath.  She was evaluated with a CTA which showed a right sided pulmonary embolism and moderate bilateral pleural effusions.   She was admitted and started on heparin infusion for pulmonary embolism.   PCCM consulted for pulmonary evaluation.   Pertinent  Medical History  ADHD Anemia Depression / Anxiety  Exercise Induced Asthma  GERD Family hx of pancreatic, thyroid cancer  Significant Hospital Events: Including procedures, antibiotic start and stop dates in addition to other pertinent events   3/25 Admit 3/26 PCCM consulted  3/27 Pleural fluid cytology returned from 3/21 > positive for malignancy  Interim History / Subjective:  Difficult time overnight due to shortness of breath  RN reports some degree of hallucination  Afebrile   Objective   Blood pressure (!) 153/86, pulse (!) 117, temperature 97.6 F (36.4 C), temperature source Axillary, resp. rate 19, height 5\' 5"  (1.651 m),  weight 67.6 kg, SpO2 97 %.        Intake/Output Summary (Last 24 hours) at 05/15/2022 [id number] Last data filed at 05/15/2022 [id number] Gross per 24 hour  Intake 474.67 ml  Output 500 ml  Net -25.33 ml   Filed Weights   05/12/22 1136  Weight: 67.6 kg    Examination: General: chronically ill appearing adult female lying in bed in NAD  HEENT: MM pink/moist, good dentition, anicteric Neuro: AAOx4, speech clear, MAE CV: s1s2 RRR, ST on monitor, no m/r/g PULM: tachypnea, abd accessory muscle use, diminished breath sounds on left GI: soft, bsx4 active  Extremities: warm/dry, no edema  Skin: no rashes or lesions, multiple tattoos   Left Pleural Fluid > malignant cells consistent with known primary breast cancer   Resolved Hospital Problem list     Assessment & Plan:   Acute Hypoxic Respiratory Failure in setting of PE and suspected Lymphangitic Spread  Dyspnea  New O2 need on admit, right side PE from distal pulmonary artery extending into lower lobe branch.  Suspect CT findings represent lymphangitic spread of breast cancer. Less likely radiation pneumonitis given timing and only one treatment thus far to chest.  Less likely infectious process. Note elevated PCT in setting of malignancy.  -plan for PleurX placement this am  -wean O2 for sats >90% -heparin infusion held for pleurX -solumedrol 40 mg IV BID  -pulmonary hygiene -IS, mobilize as able   -abx per [facility], consider stopping  -DNR/DNI  -treat symptom burden with PRN anxiolytics, pain control. Anticipate she will need morphine PRN for dyspnea at discharge and benzodiazepine  -defer candidacy of further chemo / XRT to ONC/RAD ONC -supportive care   Bilateral Pleural Effusions Concerning for malignant progression of disease.  S/p thora on 3/21 with 1.8L removed, left pleural fluid positive for malignant cells, consistent with known primary breast cancer.  -patient will discharge with home hospice benefits > they will cover pleurX  catheter  -patient / family teaching for pleural drainage process at home  -follow intermittent CXR    Best Practice (right click and "Reselect all SmartList Selections" daily)  Per Primary      [name], MSN, APRN, NP-C, AGACNP-BC  Pulmonary & Critical Care 05/15/2022, 7:27 AM   Please see [email or web address] for pager details.   From 7A-7P if no response, please call [phone] After hours, please call ELink [phone]

## 2022-05-16 ENCOUNTER — Other Ambulatory Visit: Payer: Self-pay | Admitting: Student

## 2022-05-16 ENCOUNTER — Ambulatory Visit: Payer: Medicaid Other

## 2022-05-16 ENCOUNTER — Inpatient Hospital Stay: Payer: Medicaid Other | Admitting: Nurse Practitioner

## 2022-05-16 DIAGNOSIS — Z7189 Other specified counseling: Secondary | ICD-10-CM | POA: Diagnosis not present

## 2022-05-16 DIAGNOSIS — Z66 Do not resuscitate: Secondary | ICD-10-CM | POA: Diagnosis not present

## 2022-05-16 DIAGNOSIS — C50919 Malignant neoplasm of unspecified site of unspecified female breast: Secondary | ICD-10-CM | POA: Diagnosis not present

## 2022-05-16 DIAGNOSIS — I2699 Other pulmonary embolism without acute cor pulmonale: Secondary | ICD-10-CM | POA: Diagnosis not present

## 2022-05-16 DIAGNOSIS — J9601 Acute respiratory failure with hypoxia: Secondary | ICD-10-CM | POA: Diagnosis not present

## 2022-05-16 LAB — CYTOLOGY - NON PAP

## 2022-05-16 MED ORDER — MORPHINE SULFATE (PF) 2 MG/ML IV SOLN
2.0000 mg | INTRAVENOUS | Status: DC | PRN
  Administered 2022-05-16: 2 mg via INTRAVENOUS
  Filled 2022-05-16: qty 1

## 2022-05-16 MED ORDER — GLYCOPYRROLATE 0.2 MG/ML IJ SOLN
0.2000 mg | INTRAMUSCULAR | Status: DC | PRN
  Administered 2022-05-16: 0.2 mg via INTRAVENOUS
  Filled 2022-05-16: qty 1

## 2022-05-16 MED ORDER — HYDROMORPHONE HCL-NACL 50-0.9 MG/50ML-% IV SOLN
0.0000 mg/h | INTRAVENOUS | Status: DC
  Filled 2022-05-16: qty 50

## 2022-05-16 MED ORDER — POLYVINYL ALCOHOL 1.4 % OP SOLN: 1.0000 [drp] | Freq: Four times a day (QID) | OPHTHALMIC | Status: DC | PRN

## 2022-05-16 MED ORDER — HYDROMORPHONE HCL 1 MG/ML IJ SOLN
1.0000 mg | INTRAMUSCULAR | Status: DC | PRN
  Administered 2022-05-16 (×3): 1 mg via INTRAVENOUS
  Filled 2022-05-16 (×3): qty 1

## 2022-05-16 MED ORDER — GLYCOPYRROLATE 0.2 MG/ML IJ SOLN: 0.2000 mg | INTRAMUSCULAR | Status: DC | PRN

## 2022-05-16 MED ORDER — GLYCOPYRROLATE 1 MG PO TABS: 1.0000 mg | ORAL_TABLET | ORAL | Status: DC | PRN

## 2022-05-16 MED ORDER — HYDROMORPHONE BOLUS VIA INFUSION
1.0000 mg | INTRAVENOUS | Status: DC | PRN
  Administered 2022-05-16 (×2): 1 mg via INTRAVENOUS

## 2022-05-19 ENCOUNTER — Ambulatory Visit (HOSPITAL_COMMUNITY): Payer: Medicaid Other

## 2022-05-19 ENCOUNTER — Ambulatory Visit: Payer: Medicaid Other

## 2022-05-19 NOTE — Progress Notes (Signed)
30 mL of dilaudid wasted with Flossie Buffy.

## 2022-05-19 NOTE — Progress Notes (Addendum)
NAME:  Ellen [name], MRN:  [id number], DOB:  [birth year], LOS: 4 ADMISSION DATE:  05/09/2022, CONSULTATION DATE:  3/26 REFERRING MD:  Dr. Tyrell Antonio, CHIEF COMPLAINT:  SOB   History of Present Illness:  27 year old female who presented to [facility] ER on 3/25 with reports of shortness of breath.  She unfortunately carries a history of stage IV breast cancer.  She was initially diagnosed in June 2022.  At that time she had lymph node, rib, thoracic spine, liver metastasis.  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  She underwent palliative radiation to the lumbar spine and pelvic lesions.  Her initial chemotherapy regimen was Herceptin/Perjeta and Taxotere.  She had complications of refractory diarrhea requiring hospitalization requiring therapy adjustment.  Her genetic testing was negative. She is followed by Dr. Lindi Adie in [place].   The patient was recently evaluated (05/05/2022) at [facility].  Review of the visit from [place] shows the following: The patient most recently has been on Sacituzumab but it is felt by her care team that this is no longer working.  In addition she has been on carboplatin and gemcitabine.  She has never had immunotherapy as her PD-L1 was negative.  Despite therapy the patient had noted progression of her breast tumor and thickening of the skin.  She had ongoing discomfort despite taking gabapentin.  She has also received radiation to her lower spine but not her chest wall.  In December 2023 the patient began having difficulties with breathing.  Her scans at that time showed possible mild lung involvement with patchy areas throughout.  She had a viral illness at the end of December which may have triggered an asthma exacerbation.  She was treated with her albuterol, steroid inhaler, Medrol Dosepak and Z-Pak.  The patient reported  approximately 85% improvement after completion.  Recommendations from the [facility] clinic included performing a liver biopsy with repeat biomarker testing, cough control, and transition to a regimen that will treat both triple negative and HER2 positive clone such as HER2 CLIMB regimen or eribulin with margetuximab.    The patient is hopeful to take a trip to [place] in [place] from 4/16 through 4/30.   In December 2023, CT imaging of chest showed no effusion & parenchyma was largely unremarkable.  On April 29, 2022 CT showed significant increase in intralobular septal thickening & new moderate left pleural effusion.  After her [facility] visit she underwent a thoracentesis 3/21 with 1.8L obtained with intent to send for cell block for Her2 testing to determine next steps. She was planned for liver biopsy and repeat thoracentesis on 05/19/22.  However, the patient presented to the [facility] ER on 3/25 with reports of tightness in her upper abdomen, nausea, 1 episode of vomiting & shortness of breath.  She was evaluated with a CTA which showed a right sided pulmonary embolism and moderate bilateral pleural effusions.   She was admitted and started on heparin infusion for pulmonary embolism.   PCCM consulted for pulmonary evaluation.   Pertinent  Medical History  ADHD Anemia Depression / Anxiety  Exercise Induced Asthma  GERD Family hx of pancreatic, thyroid cancer  Significant Hospital Events: Including procedures, antibiotic start and stop dates in addition to other pertinent events   3/25 Admit 3/26 PCCM consulted  3/27 Pleural fluid cytology returned from 3/21 > positive for malignancy  3/28 Difficulty with SOB overnight, hallucinations 07-Jun-2022 Transitioned to comfort care  Interim History / Subjective:  Transitioned to full comfort care overnight  On dilaudid gtt  Family at bedside   Objective   Blood pressure (!) 138/97, pulse (!) 141, temperature (!) 96.7 F (35.9 C),  temperature source Axillary, resp. rate 10, height 5\' 5"  (1.651 m), weight 67.6 kg, SpO2 90 %.        Intake/Output Summary (Last 24 hours) at 2022/06/07 0933 Last data filed at June 07, 2022 0900 Gross per 24 hour  Intake 180.7 ml  Output 1650 ml  Net -1469.3 ml   Filed Weights   05/18/2022 1136  Weight: 67.6 kg    Examination: General: ill appearing adult female lying in bed, family at bedside HEENT: MM pink/dry, eyes closed, mouth open / appears comfortable  Neuro: obtunded CV: s1s2 regular, no m/r/g PULM: agonal respirations, slow with long periods of apnea, left pleurx catheter to [place] drainage  GI: soft, bsx4 active  Extremities: warm/dry, no edema  Skin: no rashes or lesions  Left Pleural Fluid > malignant cells consistent with known primary breast cancer   Resolved Hospital Problem list     Assessment & Plan:   Acute Hypoxic Respiratory Failure in setting of PE and suspected Lymphangitic Spread  Dyspnea  New O2 need on admit, right side PE from distal pulmonary artery extending into lower lobe branch.  Suspect CT findings represent lymphangitic spread of breast cancer. Less likely radiation pneumonitis given timing and only one treatment thus far to chest.  Less likely infectious process. Note elevated PCT in setting of malignancy.  -dilaudid infusion for comfort  -O2 for comfort  -support offered to family  -promote all measures that add to Ellen [name]'s comfort  -no further lab draws, films etc  Bilateral Pleural Effusions Concerning for malignant progression of disease.  S/p thora on 3/21 with 1.8L removed, left pleural fluid positive for malignant cells, consistent with known primary breast cancer.  -pleurx to [place] for comfort -no further CXR   Anticipate hospital death, minutes to hours.   Best Practice (right click and "Reselect all SmartList Selections" daily)  Per Primary      [name], MSN, APRN, NP-C, AGACNP-BC [facility] 07-Jun-2022, 9:33 AM   Please see [email or web address] for pager details.   From 7A-7P if no response, please call [phone] After hours, please call ELink [phone]

## 2022-05-19 NOTE — Progress Notes (Addendum)
       Patient Name: Ellen [name]           DOB: April 13, 1995  MRN: [id number]      Admission Date: 05/04/2022  Attending Provider: Bonnell Public, MD  Primary Diagnosis: Pulmonary embolus   Level of care: Stepdown   CODE STATUS Clarification  Patient's mother at bedside has requested the patient be made full comfort care tonight.  She would like to just focus on making the patient comfortable and having family at bedside.  Patient is currently on Dilaudid gtt. and appears to be resting comfortably in bed.    Raenette Rover, DNP, [place]

## 2022-05-19 NOTE — Progress Notes (Signed)
Chaplain was paged after Skylan passed to support family.  Siblings and their SO at bedside along with a friend and Loreen's boyfriend.  Mom arrived shortly after as well as her dad.  Chaplain provided compassionate presence and grief support.  [address] [place] Pager, [phone]

## 2022-05-19 NOTE — Progress Notes (Signed)
Time of death 43, pronounced by Farrell Ours RN & Wyndmere Sink RN. Family at bedside. MD Marthenia Rolling & NP [name] notified.
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
## 2022-05-19 NOTE — Progress Notes (Deleted)
[employer]  [phone] [phone]   Name: Ellen [name] Date: 05/12/2022 MRN: [id number]  DOB: [birth year]  Patient Care Team: Pcp, No as PCP - General [name], MD as Consulting Physician (Hematology and Oncology) Delice Bison, [name], NP as Nurse Practitioner (Hematology and Oncology) [name], MD as Consulting Physician (General Surgery) [name], MD as Consulting Physician (Radiation Oncology) [name], [name], NP as Nurse Practitioner (Nurse Practitioner) [name], [name], NP as Nurse Practitioner (Nurse Practitioner)    INTERVAL HISTORY: Ellen [name] is a 27 y.o. 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  Palliative ask to see for symptom management and goals of care.   SOCIAL HISTORY:     reports that she has never smoked. She has never used smokeless tobacco. She reports that she does not currently use alcohol. She reports current drug use. Drug: Marijuana.  ADVANCE DIRECTIVES:    CODE STATUS: Full code  PAST MEDICAL HISTORY: Past Medical History:  Diagnosis Date   ADHD (attention deficit hyperactivity disorder)    Anemia    Anxiety    Asthma    Exercise Induced   Cancer ([place])    Depression    Family history of pancreatic cancer    Family history of thyroid cancer    GERD (gastroesophageal reflux disease)    Herpes simplex    Personal history of chemotherapy 08/2020    ALLERGIES:  is allergic to other and azithromycin.  MEDICATIONS:  Current Outpatient Medications  Medication Sig Dispense Refill   acetaminophen (TYLENOL) 325 MG tablet Take 650 mg by mouth every 6 (six) hours as needed for mild pain.     acyclovir (ZOVIRAX) 800 MG tablet Take 1  tablet (800 mg total) by mouth 2 (two) times daily as needed (flare up). 60 tablet 5   acyclovir ointment (ZOVIRAX) 5 % Apply 1 application  topically 4 (four) times daily as needed (outbreak).     albuterol (PROVENTIL) (2.5 MG/3ML) 0.083% nebulizer solution Take 3 mLs (2.5 mg total) by nebulization every 6 (six) hours as needed for wheezing or shortness of breath. 75 mL 12   albuterol (VENTOLIN HFA) 108 (90 Base) MCG/ACT inhaler 2 puffs inhaled every 4-6 hours PRN 8 g 2   Calcium Carb-Cholecalciferol 500-10 MG-MCG TABS Take by mouth.     diphenoxylate-atropine (LOMOTIL) 2.5-0.025 MG tablet Take 1 tablet by mouth 4 (four) times daily as needed for diarrhea or loose stools. 30 tablet 0   fluticasone (FLOVENT HFA) 110 MCG/ACT inhaler Inhale 2 puffs into the lungs in the morning and at bedtime. 1 each 12   gabapentin (NEURONTIN) 300 MG capsule Take 1 capsule (300 mg total) by mouth 3 (three) times daily. 90 capsule 3   LORazepam (ATIVAN) 1 MG tablet Take 1 tablet (1 mg total) by mouth 2 (two) times daily as needed for anxiety. 60 tablet 1   ondansetron (ZOFRAN-ODT) 8 MG disintegrating tablet Dissolve 1 tablet (8 mg total) in mouth every 8 (eight) hours as needed for nausea or vomiting. 20 tablet 0   oxyCODONE-acetaminophen (PERCOCET) 5-325 MG tablet Take 1 tablet by mouth every 4 (four) hours as needed for severe pain. 20 tablet 0   pantoprazole (PROTONIX) 40 MG tablet Take 1 tablet (40 mg total) by mouth daily. 30 tablet 3  prochlorperazine (COMPAZINE) 10 MG tablet Take 1 tablet (10 mg total) by mouth every 6 (six) hours as needed for nausea or vomiting. 30 tablet 0   venlafaxine XR (EFFEXOR-XR) 150 MG 24 hr capsule Take 1 capsule (150 mg total) by mouth daily with breakfast. (Patient taking differently: Take 225 mg by mouth daily with breakfast.) 30 capsule 6   No current facility-administered medications for this visit.    VITAL SIGNS: There were no vitals taken for this visit. There were no  vitals filed for this visit.  Estimated body mass index is 23.46 kg/m as calculated from the following:   Height as of 05/08/22: 5\' 5"  (1.651 m).   Weight as of 05/08/22: 141 lb (64 kg).   PERFORMANCE STATUS (ECOG) : 1 - Symptomatic but completely ambulatory   Physical Exam General: NAD Cardiovascular: regular rate and rhythm Pulmonary: normal breathing pattern  Extremities: no edema, no joint deformities Skin: no rashes, chemo induced alopecia, left breast changes Neurological: AAO x3, mood appropriate   IMPRESSION:   We discussed continuing to take life one day at a time, focusing on what matters to her most, and reassuring that she is a beautiful person with a purpose driven life.   I discussed the importance of continued conversation with family and their medical providers regarding overall plan of care and treatment options, ensuring decisions are within the context of the patients values and GOCs.  PLAN:  Ongoing goals of care and support as needed Gabapentin 300mg  three times daily. Tolerating well. Pain improved.  I will plan to see patient back in 2-3 weeks in collaboration to other oncology appointments.    Patient expressed understanding and was in agreement with this plan. She also understands that She can call the clinic at any time with any questions, concerns, or complaints.     Time Total: 35 min   Visit consisted of counseling and education dealing with the complex and emotionally intense issues of symptom management and palliative care in the setting of serious and potentially life-threatening illness.Greater than 50%  of this time was spent counseling and coordinating care related to the above assessment and plan.  Alda Lea, AGPCNP-BC  Palliative Medicine Team/[facility]

## 2022-05-19 DEATH — deceased

## 2022-05-20 ENCOUNTER — Ambulatory Visit: Payer: Medicaid Other

## 2022-05-21 ENCOUNTER — Ambulatory Visit: Payer: Medicaid Other

## 2022-05-22 ENCOUNTER — Inpatient Hospital Stay: Payer: Medicaid Other

## 2022-05-22 ENCOUNTER — Inpatient Hospital Stay: Payer: Medicaid Other | Admitting: Hematology and Oncology

## 2022-05-22 ENCOUNTER — Ambulatory Visit: Payer: Medicaid Other

## 2022-05-23 ENCOUNTER — Ambulatory Visit: Payer: Medicaid Other

## 2022-05-26 ENCOUNTER — Ambulatory Visit: Payer: Medicaid Other

## 2022-05-27 ENCOUNTER — Ambulatory Visit: Payer: Medicaid Other

## 2022-06-18 NOTE — Discharge Summary (Signed)
Physician Discharge Summary  Ellen [name]:[id number] DOB: 1995-12-15 DOA: June 11, 2022  PCP: Patient, No Pcp Per  Death Discharge Summary:  Admit date: Jun 11, 2022 Patient died on: 06/15/22  Time of death: 11.40 AM.   Pronounced by: Gayland Curry RN and Ardeen Fillers RN)  Time spent: 35 minutes   Death Discharge Diagnoses:  Principal Problem:   Pulmonary embolus Active Problems:   Depression   GAD (generalized anxiety disorder)   Primary malignant neoplasm of breast with metastasis   Acute respiratory failure with hypoxia   Pleural effusion   Bilateral pleural effusion   DNR (do not resuscitate)   Neoplasm related pain   Goals of care, counseling/discussion   Palliative care encounter   Hospital Course:  Patient was a 27 year old with past medical history significant for ADHD, anemia, anxiety, exercise-induced asthma, depression, herpes simplex, GERD, stage IV breast cancer with osseous metastasis, diagnosed 2022, recently discharged 05/09/2022 for acute respiratory failure in the setting of left pleural malignant effusion underwent thoracentesis and discharged home on oxygen.   Patient presented with worsening shortness of breath.  CT chest angio was positive for Pulmonary Embolism and moderate bilateral pleural effusion and concerns for pulmonary edema; lymphangitic nodularity concerning for possible metastatic disease.  Pleural fluid analysis revealed malignant cells.  Patient underwent thoracentesis and Pleurx tube placement.  Oncology, pulmonary and palliative care team were consulted to assist with patient's management.  Patient's care was transitioned to comfort directed care only.  Patient died on 06/15/2022 at 11:40 AM.    Procedures: Thoracentesis and Pleurx tube placement.    Consultations: Oncology Pulmonary Palliative care    The results of significant diagnostics from this hospitalization (including imaging, microbiology, ancillary and laboratory) are  listed below for reference.    Significant Diagnostic Studies: DG CHEST PORT 1 VIEW  Result Date: 05/15/2022 CLINICAL DATA:  Chest tube placement EXAM: PORTABLE CHEST 1 VIEW COMPARISON:  05/14/2022 FINDINGS: Interval placement of left chest tube, with tip in the left apex. Redemonstrated right chest port with tip approximating the right atrium. Cardiac size is within normal limits. Redemonstrated bilateral patchy alveolar opacities, which appears slightly improved in the left lower lung. No definite pleural effusion. Possible trace left apical pneumothorax. No acute osseous abnormality. IMPRESSION: 1. Interval placement of left chest tube, with tip in the left apex, with possible trace left apical pneumothorax. 2. Redemonstrated bilateral patchy alveolar opacities, which may represent pulmonary edema, lymphangitic carcinomatosis, and/or multifocal pneumonia. This appears slightly improved in the left lower lung. Electronically Signed   By: Wiliam Ke M.D.   On: 05/15/2022 13:28   DG CHEST PORT 1 VIEW  Result Date: 05/14/2022 CLINICAL DATA:  Respiratory failure EXAM: PORTABLE CHEST 1 VIEW COMPARISON:  Previous studies including the examination of 06-11-2022 FINDINGS: Cardiac size is within normal limits. Extensive patchy alveolar densities are seen throughout both lungs with interval worsening. There is interval increase in size of small to moderate left pleural effusion. Right lateral costophrenic angle is indistinct suggesting small effusion. There is no pneumothorax. Tip of right IJ chest port is seen in region of right atrium. IMPRESSION: Extensive diffuse interstitial and alveolar densities are noted in both lungs with interval worsening. Findings suggest interval worsening of pulmonary edema and possibly interval worsening of multifocal pneumonia. There is increase in amount of pleural effusions, more so on the left side. Electronically Signed   By: Ernie Avena M.D.   On: 05/14/2022 18:49    VAS [place] LOWER EXTREMITY VENOUS (DVT)  Result Date:  05/13/2022  Lower Venous DVT Study Patient Name:  Ellen [name]  Date of Exam:   05/13/2022 Medical Rec #: [id number]           Accession #:    [id number] Date of Birth: March 01, 1995          Patient Gender: F Patient Age:   11 years Exam Location:  [facility] Procedure:      VAS [place] LOWER EXTREMITY VENOUS (DVT) Referring Phys: DAVID ORTIZ --------------------------------------------------------------------------------  Indications: Pulmonary embolism.  Risk Factors: Cancer. Comparison Study: No previous exams Performing Technologist: Jody Hill RVT, RDMS  Examination Guidelines: A complete evaluation includes B-mode imaging, spectral Doppler, color Doppler, and power Doppler as needed of all accessible portions of each vessel. Bilateral testing is considered an integral part of a complete examination. Limited examinations for reoccurring indications may be performed as noted. The reflux portion of the exam is performed with the patient in reverse Trendelenburg.  +---------+---------------+---------+-----------+----------+-------------------+ RIGHT    CompressibilityPhasicitySpontaneityPropertiesThrombus Aging      +---------+---------------+---------+-----------+----------+-------------------+ CFV      Full           Yes      Yes                                      +---------+---------------+---------+-----------+----------+-------------------+ SFJ      Full                                                             +---------+---------------+---------+-----------+----------+-------------------+ FV Prox  Full           Yes      Yes                                      +---------+---------------+---------+-----------+----------+-------------------+ FV Mid   Full           Yes      Yes                                      +---------+---------------+---------+-----------+----------+-------------------+ FV DistalFull            Yes      Yes                                      +---------+---------------+---------+-----------+----------+-------------------+ PFV      Full                                                             +---------+---------------+---------+-----------+----------+-------------------+ POP      Full           Yes      Yes                                      +---------+---------------+---------+-----------+----------+-------------------+  PTV      Full                                                             +---------+---------------+---------+-----------+----------+-------------------+ PERO     Full                                         Not well visualized +---------+---------------+---------+-----------+----------+-------------------+   +---------+---------------+---------+-----------+----------+--------------+ LEFT     CompressibilityPhasicitySpontaneityPropertiesThrombus Aging +---------+---------------+---------+-----------+----------+--------------+ CFV      Full           Yes      Yes                                 +---------+---------------+---------+-----------+----------+--------------+ SFJ      Full                                                        +---------+---------------+---------+-----------+----------+--------------+ FV Prox  Full           Yes      Yes                                 +---------+---------------+---------+-----------+----------+--------------+ FV Mid   Full           Yes      Yes                                 +---------+---------------+---------+-----------+----------+--------------+ FV DistalFull           Yes      Yes                                 +---------+---------------+---------+-----------+----------+--------------+ PFV      Full                                                        +---------+---------------+---------+-----------+----------+--------------+ POP       Full           Yes      Yes                                 +---------+---------------+---------+-----------+----------+--------------+ PTV      Full                                                        +---------+---------------+---------+-----------+----------+--------------+ PERO     Full                                                        +---------+---------------+---------+-----------+----------+--------------+  Summary: BILATERAL: - No evidence of deep vein thrombosis seen in the lower extremities, bilaterally. -No evidence of popliteal cyst, bilaterally.   *See table(s) above for measurements and observations. Electronically signed by Coral Else MD on 05/13/2022 at 7:20:28 PM.    Final    ECHOCARDIOGRAM COMPLETE  Result Date: 05/13/2022    ECHOCARDIOGRAM REPORT   Patient Name:   ELDANA ISIP Date of Exam: 05/13/2022 Medical Rec #:  [id number]          Height:       65.0 in Accession #:    [id number]         Weight:       149.0 lb Date of Birth:  07/14/1995         BSA:          1.746 m Patient Age:    26 years           BP:           118/76 mmHg Patient Gender: F                  HR:           119 bpm. Exam Location:  Inpatient Procedure: 2D Echo, Color Doppler, Cardiac Doppler and Intracardiac            Opacification Agent Indications:    CHF  History:        Patient has prior history of Echocardiogram examinations, most                 recent 10/18/2021. Breast cancer, Pulmonary Embolus.  Sonographer:    Milbert Coulter Referring Phys: [id number] DAVID MANUEL ORTIZ  Sonographer Comments: Technically challenging study due to limited acoustic windows. Left breast mass.  FINDINGS  Left Ventricle: Definity contrast agent was given IV to delineate the left ventricular endocardial borders. Very limited images are obtained. The left ventricle is small and "underfilled" with hyperdynamic systolic function. EF is >75%. There is an intracavitary "gradient" of up to 4 m/s due the  hyperdynamic state. "Impaired relaxation" pattern of mitral inflow is likely due to poor LV filling. The right heart chambers are not well seen. There is s poor quality subcostal view that suggests that right ventricular function is normal. The inferior vena cava is not dilated. A large pleural effusion is seen, with nodular masses on the plural surface abutting the parietal pericardium. There is probably only a trivial pericardial effusion. Cannot exclude neoplastic infiltration of the pericardium. Thurmon Fair MD Electronically signed by Thurmon Fair MD Signature Date/Time: 05/13/2022/3:58:10 PM    Final    CT Angio Chest PE W and/or Wo Contrast  Result Date: 04/23/2022 CLINICAL DATA:  Chest pain, shortness of breath. History of metastatic breast cancer. EXAM: CT ANGIOGRAPHY CHEST WITH CONTRAST TECHNIQUE: Multidetector CT imaging of the chest was performed using the standard protocol during bolus administration of intravenous contrast. Multiplanar CT image reconstructions and MIPs were obtained to evaluate the vascular anatomy. RADIATION DOSE REDUCTION: This exam was performed according to the departmental dose-optimization program which includes automated exposure control, adjustment of the mA and/or kV according to patient size and/or use of iterative reconstruction technique. CONTRAST:  60mL OMNIPAQUE IOHEXOL 350 MG/ML SOLN COMPARISON:  April 29, 2022. FINDINGS: Cardiovascular: Filling defect is noted extending from distal right pulmonary artery into lower lobe branch consistent with pulmonary embolus. Normal cardiac size. No pericardial effusion. Mediastinum/Nodes: Thyroid gland is unremarkable. Esophagus is unremarkable. Continued presence of bilateral axillary and  mediastinal adenopathy is noted consistent with metastatic disease as noted on prior exam. Largest lymph node measures 2.1 cm in right paratracheal region which appears to be enlarged compared to prior exam. Lungs/Pleura: No pneumothorax is  noted. Moderate bilateral pleural effusions are noted. Increased interstitial densities are noted throughout both lungs concerning for pulmonary edema. Lymphangitic nodularity is also noted concerning for possible metastatic disease. Upper Abdomen: Heterogeneous appearance of hepatic parenchyma is noted suggesting metastatic disease. Musculoskeletal: Sclerotic metastases are again noted. Review of the MIP images confirms the above findings. IMPRESSION: Linear pulmonary embolus is noted extending from distal portion of right pulmonary artery into lower lobe branches. Critical Value/emergent results were called by telephone at the time of interpretation on 22-May-2022 at 1:44 pm to provider ADAM CURATOLO , who verbally acknowledged these results. Continued presence of bilateral axillary mediastinal adenopathy consistent with metastatic disease. Moderate bilateral pleural effusions are noted. Increased interstitial densities are noted throughout both lungs concerning for pulmonary edema, as well as lymphangitic nodularity concerning for possible metastatic disease. Heterogeneous appearance of hepatic parenchyma is noted consistent with metastatic disease. Sclerotic osseous metastases are again noted. Electronically Signed   By: Lupita Raider M.D.   On: 2022/05/22 13:44   DG Chest 2 View  Result Date: 2022/05/22 CLINICAL DATA:  Shortness of breath, chest pain EXAM: CHEST - 2 VIEW COMPARISON:  Previous studies including the examination of 05/08/2022 FINDINGS: Cardiac size is within normal limits. Central pulmonary vessels are more prominent. Patchy interstitial and alveolar densities are noted in both lungs with interval worsening. Lateral CP angles are indistinct. There is no pneumothorax. Tip of right IJ central venous catheter is seen in right atrium. IMPRESSION: Central pulmonary vessels are more prominent. There is interval increase in patchy interstitial and alveolar densities in both lungs suggesting worsening  of pulmonary edema or worsening of multifocal pneumonia. Small bilateral pleural effusions. There is no pneumothorax. Electronically Signed   By: Ernie Avena M.D.   On: 05-22-2022 12:19   US THORACENTESIS ASP PLEURAL SPACE W/IMG GUIDE  Result Date: 05/08/2022 INDICATION: Patient with history of metastatic breast cancer, dyspnea, left pleural effusion. Request received for diagnostic and therapeutic left thoracentesis. EXAM: ULTRASOUND GUIDED DIAGNOSTIC AND THERAPEUTIC LEFT THORACENTESIS MEDICATIONS: 8 mL 1% lidocaine COMPLICATIONS: None immediate. PROCEDURE: An ultrasound guided thoracentesis was thoroughly discussed with the patient and questions answered. The benefits, risks, alternatives and complications were also discussed. The patient understands and wishes to proceed with the procedure. Written consent was obtained. Ultrasound was performed to localize and mark an adequate pocket of fluid in the left chest. The area was then prepped and draped in the normal sterile fashion. 1% Lidocaine was used for local anesthesia. Under ultrasound guidance a 6 Fr Safe-T-Centesis catheter was introduced. Thoracentesis was performed. The catheter was removed and a dressing applied. FINDINGS: A total of approximately 1.6 liters of yellow fluid was removed. Samples were sent to the laboratory as requested by the clinical team. IMPRESSION: Successful ultrasound guided diagnostic and therapeutic left thoracentesis yielding 1.6 liters of pleural fluid. Read by: Jeananne Rama, PA-C Electronically Signed   By: Richarda Overlie M.D.   On: 05/08/2022 13:52   DG Chest 1 View  Result Date: 05/08/2022 CLINICAL DATA:  27 year old female with metastatic breast cancer status post ultrasound-guided left side thoracentesis this morning. EXAM: CHEST  1 VIEW COMPARISON:  Portable chest 05/07/2022 and earlier. FINDINGS: Portable AP upright view at 1116 hours. Improved left lung base ventilation and no pneumothorax identified. Left  pleural  effusion seems largely resolved. Right chest power port again noted. Diffuse reticulonodular lung opacity persists. Mediastinal contours remain within normal limits. No areas of worsening ventilation. Stable visualized osseous structures.  Negative visible bowel gas. IMPRESSION: 1. Largely resolved left pleural effusion, improved left lung base ventilation, and no pneumothorax following thoracentesis. 2. 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 Electronically Signed   By: Odessa Fleming M.D.   On: 05/08/2022 11:26   DG Chest Port 1 View  Result Date: 05/07/2022 CLINICAL DATA:  Shortness of breath EXAM: PORTABLE CHEST 1 VIEW COMPARISON:  04/28/2022, CT 04/29/2022 FINDINGS: Right-sided central venous port tip at the right atrium. Small moderate left effusion increased compared to prior radiograph from 03/11. Diffuse interstitial somewhat nodular process. Consolidation left lung base. Stable cardiomediastinal silhouette. No pneumothorax IMPRESSION: Small to moderate left pleural effusion, increased compared to prior radiograph. Diffuse interstitial somewhat nodular process suspected to be secondary to diffuse lymphangitic spread of tumor on recent CT, though there may be a superimposed component of edema or infection. There is interval consolidation of left base. Electronically Signed   By: Jasmine Pang M.D.   On: 05/07/2022 16:07   CT CHEST ABDOMEN PELVIS W CONTRAST  Result Date: 05/01/2022 CLINICAL DATA:  Metastatic breast cancer restaging * Tracking Code: BO * EXAM: CT CHEST, ABDOMEN, AND PELVIS WITH CONTRAST TECHNIQUE: Multidetector CT imaging of the chest, abdomen and pelvis was performed following the standard protocol during bolus administration of intravenous contrast. RADIATION DOSE REDUCTION: This exam was performed according to the departmental dose-optimization program which includes automated exposure control, adjustment of the mA and/or kV according to patient size and/or use of  iterative reconstruction technique. CONTRAST:  OMNIPAQUE IOHEXOL 300 MG/ML  SOLN COMPARISON:  01/27/2022 FINDINGS: CT CHEST FINDINGS Cardiovascular: Right chest port catheter. Normal heart size. No pericardial effusion. Mediastinum/Nodes: Interval enlargement bilateral axillary and sub pectoral lymph nodes and soft tissue nodules, largest left axillary node measuring 1.8 x 1.1 cm, previously 1.1 x 0.9 cm (series 2, image 16). Numerous prominent and enlarged mediastinal and bilateral hilar lymph nodes, some of which are also enlarged, for example a paraesophageal node measuring 1.3 x 0.9 cm, previously subcentimeter (series 2, image 38). Thyroid gland, trachea, and esophagus demonstrate no significant findings. Lungs/Pleura: New, moderate left pleural effusion with extensive pleural thickening and nodularity, pleural nodule of the medial left lung base measuring 1.4 x 0.8 cm (series 2, image 54). Very significant increase in diffuse bilateral interlobular septal thickening and lymphangitic nodularity with diffusely scattered ground-glass airspace opacity throughout the lungs. Diffuse bilateral bronchial wall thickening. Musculoskeletal: Redemonstrated large mass of the left breast with overlying skin thickening (series 2, image 30). No acute osseous findings. CT ABDOMEN PELVIS FINDINGS Hepatobiliary: Numerous new, generally subtle and ill-defined hypodense liver lesions, for example in the anterior right lobe of the liver measuring 1.6 x 1.5 cm (series 2, image 53). No gallstones, gallbladder wall thickening, or biliary dilatation. Pancreas: Unremarkable. No pancreatic ductal dilatation or surrounding inflammatory changes. Spleen: Normal in size without significant abnormality. Adrenals/Urinary Tract: Adrenal glands are unremarkable. Kidneys are normal, without renal calculi, solid lesion, or hydronephrosis. Bladder is unremarkable. Stomach/Bowel: Stomach is within normal limits. Appendix appears normal. No  evidence of bowel wall thickening, distention, or inflammatory changes. Vascular/Lymphatic: No significant vascular findings are present. No enlarged abdominal or pelvic lymph nodes. Reproductive: No mass or other abnormality. Other: No abdominal wall hernia or abnormality. No ascites. Musculoskeletal: No acute osseous findings. No significant change in widespread sclerotic osseous metastatic disease throughout  all included portions of the axial skeleton, including a pathologic wedge deformity of L3 and a pathologic inferior endplate deformity of L5 (series 5, image 86). IMPRESSION: 1. Interval enlargement of bilateral axillary and sub pectoral lymph nodes and soft tissue nodules. 2. New, moderate left pleural effusion with extensive pleural thickening and nodularity. 3. Very significant increase in diffuse bilateral interlobular septal thickening and lymphangitic nodularity throughout the lungs with diffusely scattered ground-glass airspace opacity. 4. Numerous new, hypodense metastatic liver lesions. 5. No significant change in widespread sclerotic osseous metastatic disease throughout all included portions of the axial skeleton, including a pathologic wedge deformity of L3 and a pathologic inferior endplate deformity of L5. 6. Constellation of findings is consistent with significant interval worsening of widespread metastatic disease. Electronically Signed   By: Jearld LeschAlex D Bibbey M.D.   On: 05/01/2022 09:26   DG Chest 2 View  Result Date: 04/28/2022 CLINICAL DATA:  Cough and wheezing, metastatic breast cancer EXAM: CHEST - 2 VIEW COMPARISON:  03/21/2022 FINDINGS: Right IJ power port catheter tip SVC RA junction as before. Normal heart size and central vascularity. Similar pattern of diffuse reticulonodular interstitial opacities throughout both lungs. This remains nonspecific for diffuse pulmonary lymphangitic spread of tumor versus infectious/inflammatory process, or pulmonary edema. No large effusion or  pneumothorax. Trachea midline. No acute osseous finding. IMPRESSION: Similar pattern of diffuse reticulonodular interstitial opacities throughout both lungs as above. No interval change. Electronically Signed   By: Judie PetitM.  Shick M.D.   On: 04/28/2022 12:14    Microbiology: No results found for this or any previous visit (from the past 240 hour(s)).   Labs: Basic Metabolic Panel: No results for input(s): "NA", "K", "CL", "CO2", "GLUCOSE", "BUN", "CREATININE", "CALCIUM", "MG", "PHOS" in the last 168 hours. Liver Function Tests: No results for input(s): "AST", "ALT", "ALKPHOS", "BILITOT", "PROT", "ALBUMIN" in the last 168 hours. No results for input(s): "LIPASE", "AMYLASE" in the last 168 hours. No results for input(s): "AMMONIA" in the last 168 hours. CBC: No results for input(s): "WBC", "NEUTROABS", "HGB", "HCT", "MCV", "PLT" in the last 168 hours. Cardiac Enzymes: No results for input(s): "CKTOTAL", "CKMB", "CKMBINDEX", "TROPONINI" in the last 168 hours. BNP: BNP (last 3 results) Recent Labs    04/28/22 1024 05/13/22 1021  BNP 21.3 20.5    ProBNP (last 3 results) No results for input(s): "PROBNP" in the last 8760 hours.  CBG: No results for input(s): "GLUCAP" in the last 168 hours.     Signed:  Berton MountSylvester Teigen Parslow, MD  [facility] Pager #: [phone] 7PM-7AM contact night coverage as above

## 2022-06-20 NOTE — Progress Notes (Signed)
  Radiation Oncology         [phone]) [phone] ________________________________  Name: Ellen [name] MRN: [id number]  Date: 05/14/2022  DOB: 1995/03/26  End of Treatment Note  Diagnosis:    27 yo woman with stage IV breast cancer with painful left beast mass, invading into the left chest wall         Indication for treatment:  Palliation       Radiation treatment dates:   05/09/22 - 05/14/22  Site/dose:   The chest wall was treated to 6 Gy in 2 fractions of 3 Gy each prior to discontinuing treatment due to a significant decline in her condition leading up to her passing.  Beams/energy:   A 3D field set-up was employed with 6 MV X-rays  Narrative: The patient tolerated radiation treatment relatively well but unfortunately her condition began to rapidly decline, leading up to her passing on 05/15/2022.     Plan: The patient deceased prior to completion of the proposed palliative treatment. ________________________________  Artist Pais. Kathrynn Running, M.D.

## 2023-06-04 IMAGING — US US BREAST*L* COMPLETE INC AXILLA
1 series · 3 of 3 positions shown · non-contrast
Comparison: Outside diagnostic mammogram and ultrasound dated
08/09/2020.

CLINICAL DATA: History of biopsy-proven LEFT breast cancer status
post chemotherapy. Patient describes generalized pain throughout the
LEFT breast and focal pain within the outer LEFT breast radiating
towards the LEFT flank.

EXAM:
DIGITAL DIAGNOSTIC UNILATERAL LEFT MAMMOGRAM WITH TOMOSYNTHESIS AND
CAD; ULTRASOUND LEFT BREAST COMPLETE
TECHNIQUE: Left digital diagnostic mammography and breast tomosynthesis was
performed. The images were evaluated with computer-aided detection.;
Targeted ultrasound examination of the left breast was performed.

[Series 1: us breast*left* complete inc axilla · 0.07mm/px · 3 of 3 slices shown]
[im 1/3]
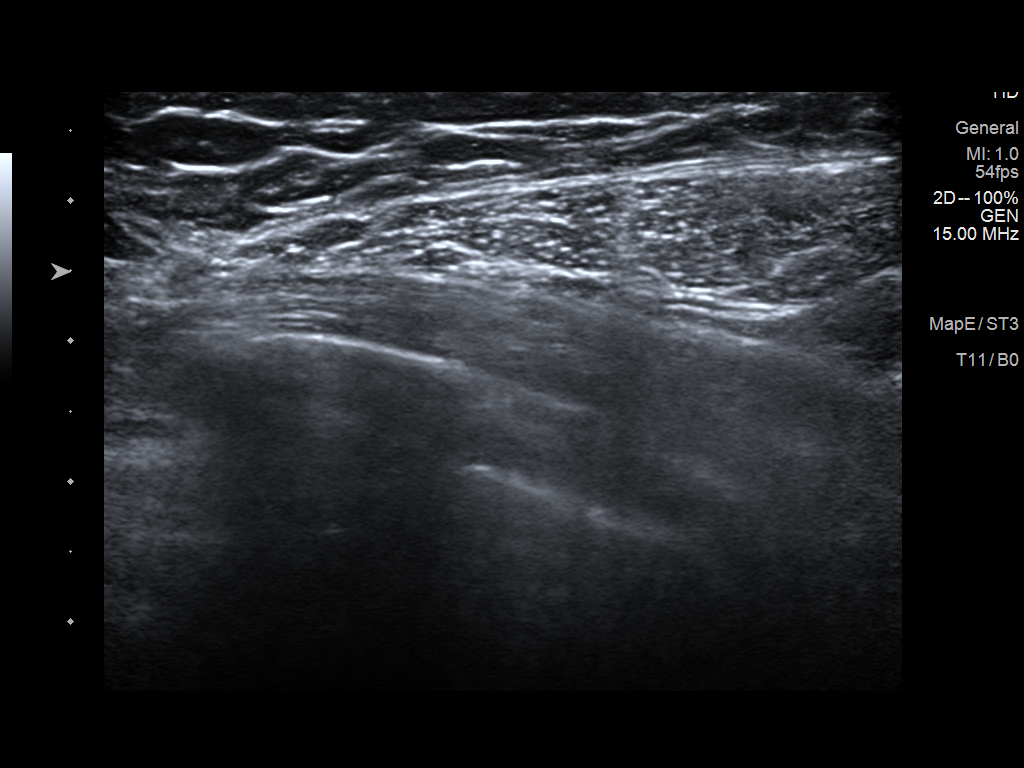
[im 2/3]
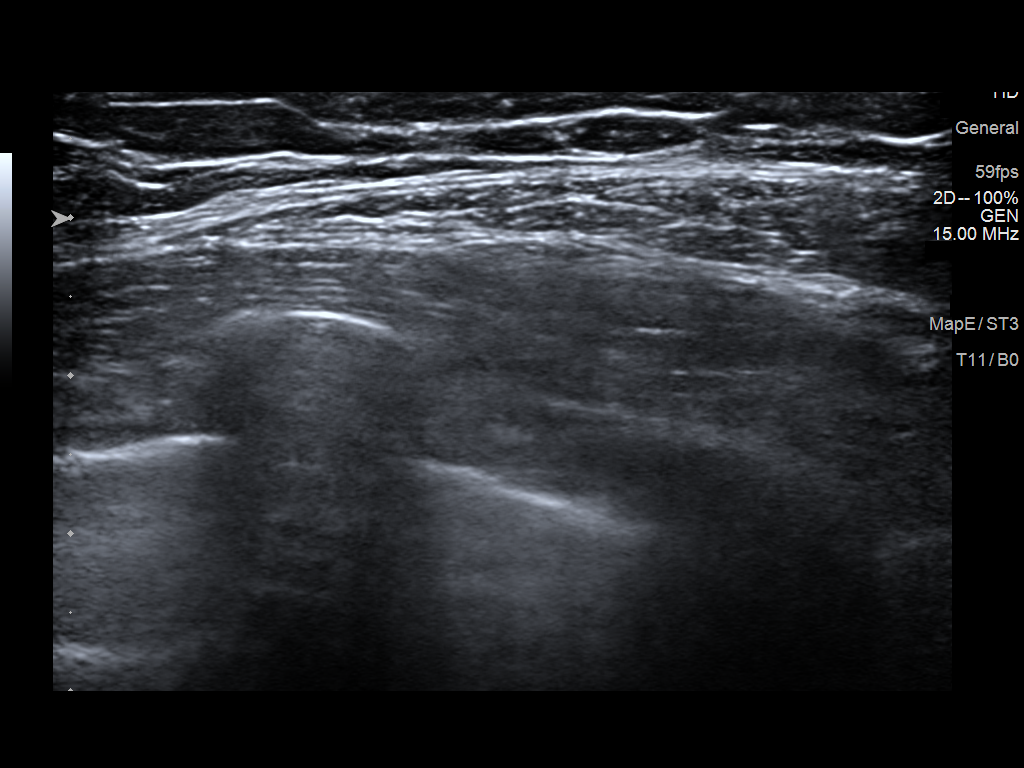
[im 3/3]
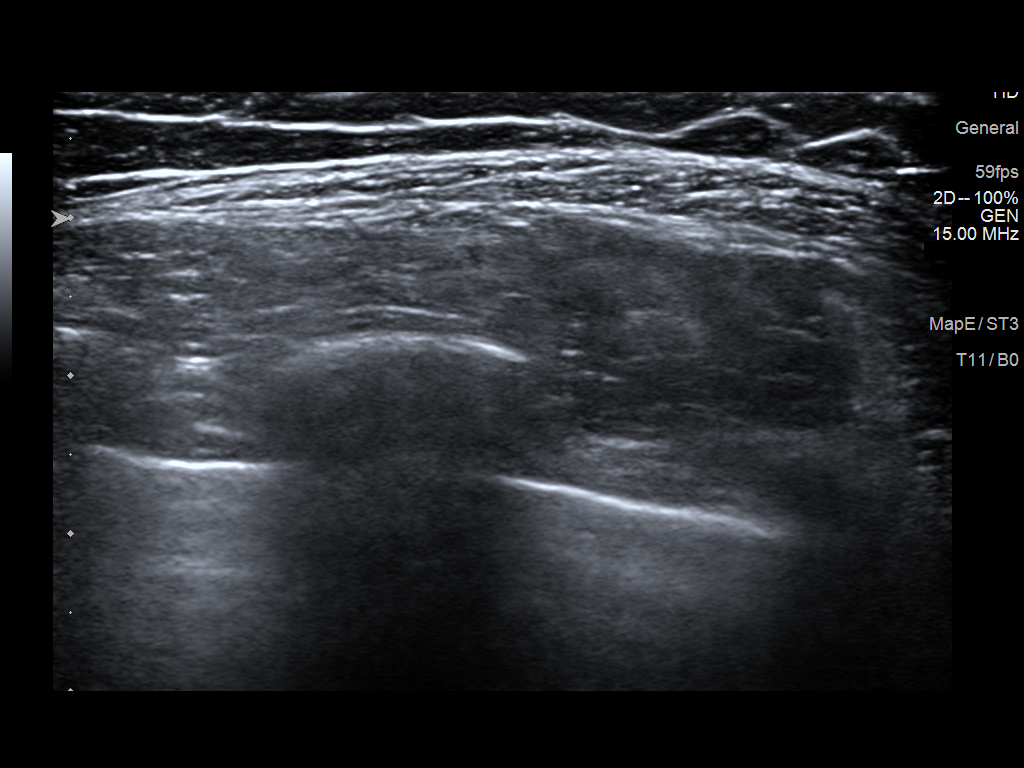

[3 of 3 positions shown; findings below may reference images not displayed]

Also, chest CTs including most recent of 03/19/2021.

ACR Breast Density Category c: The breast tissue is heterogeneously
dense, which may obscure small masses.
FINDINGS: The biopsy-proven malignancy within the LEFT breast appears
decreased in size/extent compared to the outside diagnostic
mammogram of 08/09/2020. Associated biopsy clip in place. Overlying
skin thickening has decreased as well.

There are no new dominant masses, suspicious calcifications or
secondary signs of malignancy identified within the LEFT breast on
today's exam.

Targeted ultrasound is performed, evaluating the far outer LEFT
breast and the soft tissues of the adjacent LEFT flank as directed
by the patient, showing only normal soft tissues. No solid or cystic
mass.
IMPRESSION: 1. Known biopsy-proven LEFT breast cancer. Breast MRI report of
08/16/2020 described the LEFT breast malignancy as involving all 4
quadrants and involving the skin of the nipple-areolar complex.
2. The size and extent of the biopsy-proven LEFT breast cancer
appears decreased in size/extent compared to the outside diagnostic
mammogram of 08/09/2020. The overlying skin thickening has decreased
as well. Findings suggest a positive response to interval treatment.
3. No evidence of malignancy or acute findings within the far outer
LEFT breast and adjacent LEFT flank, corresponding to the area of
patient's clinical concern today.

RECOMMENDATION:
1. Per treatment plan for patient's known LEFT breast cancer.
2. Consider follow-up breast MRI for more accurate assessment of
patient's response to interval treatment.
3. Patient was also recommended for MRI-guided biopsy of 2
suspicious lesions in the RIGHT breast, as detailed on breast MRI
report of 08/16/2020. If a mastectomy for the RIGHT breast is not
planned, would recommend the MRI-guided biopsies to exclude
contralateral disease.
4. Possible causes of her breast/flank pain were discussed with the
patient, including patient's history of LEFT rib metastasis and
thoracic spine metastasis which were demonstrated on previous CTs.

I have discussed the findings and recommendations with the patient.
If applicable, a reminder letter will be sent to the patient
regarding the next appointment.

BI-RADS CATEGORY  6: Known biopsy-proven malignancy.

## 2023-06-04 IMAGING — MG MM DIGITAL DIAGNOSTIC UNILAT*L* W/ TOMO W/ CAD
4 series · 4 of 12 positions shown · non-contrast
Comparison: Outside diagnostic mammogram and ultrasound dated
08/09/2020.

CLINICAL DATA: History of biopsy-proven LEFT breast cancer status
post chemotherapy. Patient describes generalized pain throughout the
LEFT breast and focal pain within the outer LEFT breast radiating
towards the LEFT flank.

EXAM:
DIGITAL DIAGNOSTIC UNILATERAL LEFT MAMMOGRAM WITH TOMOSYNTHESIS AND
CAD; ULTRASOUND LEFT BREAST COMPLETE
TECHNIQUE: Left digital diagnostic mammography and breast tomosynthesis was
performed. The images were evaluated with computer-aided detection.;
Targeted ultrasound examination of the left breast was performed.

[L CC synth-2D]
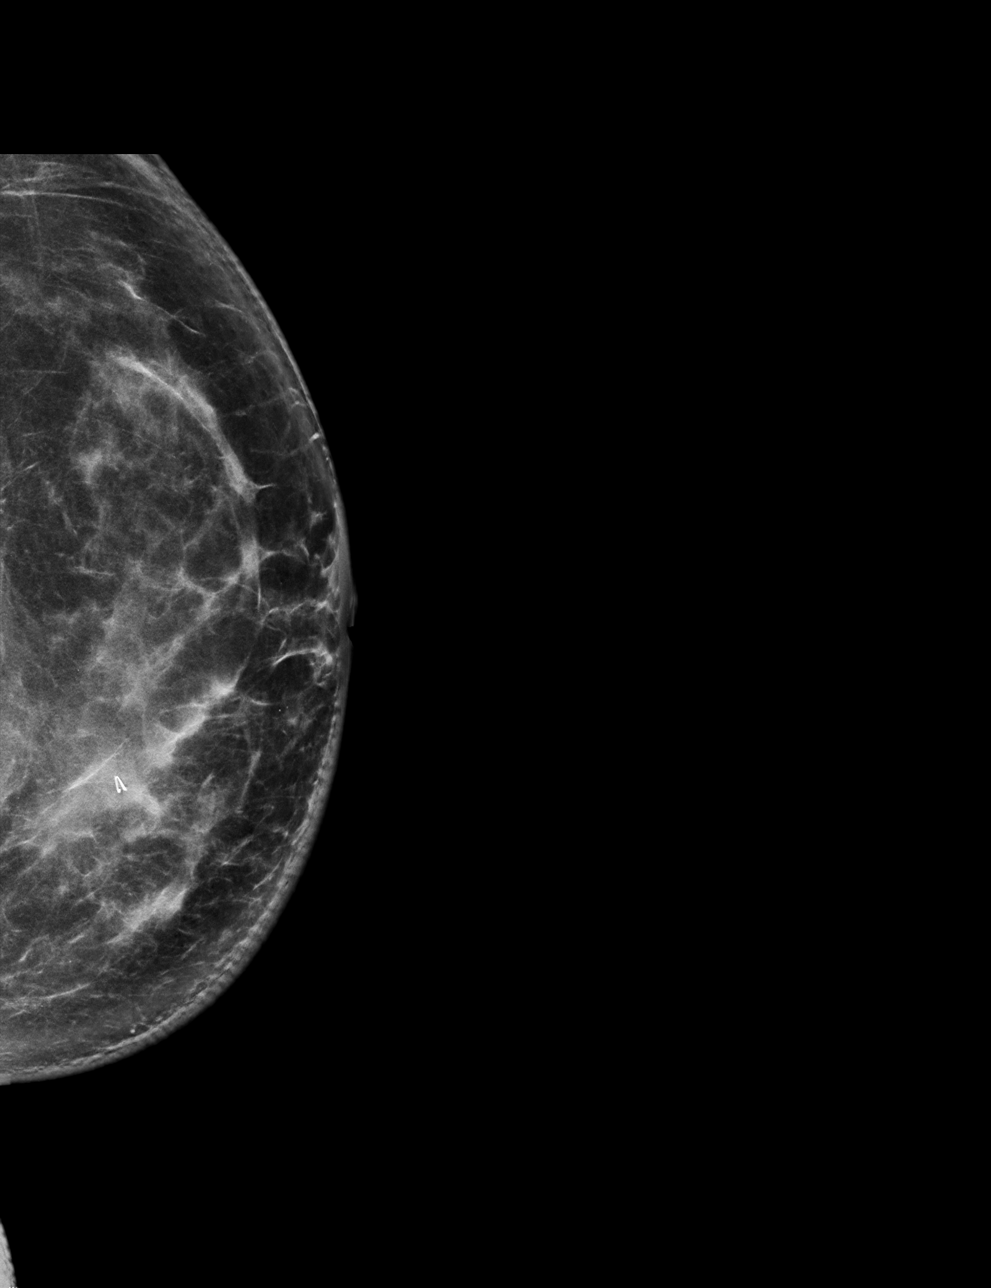

[L MLO synth-2D]
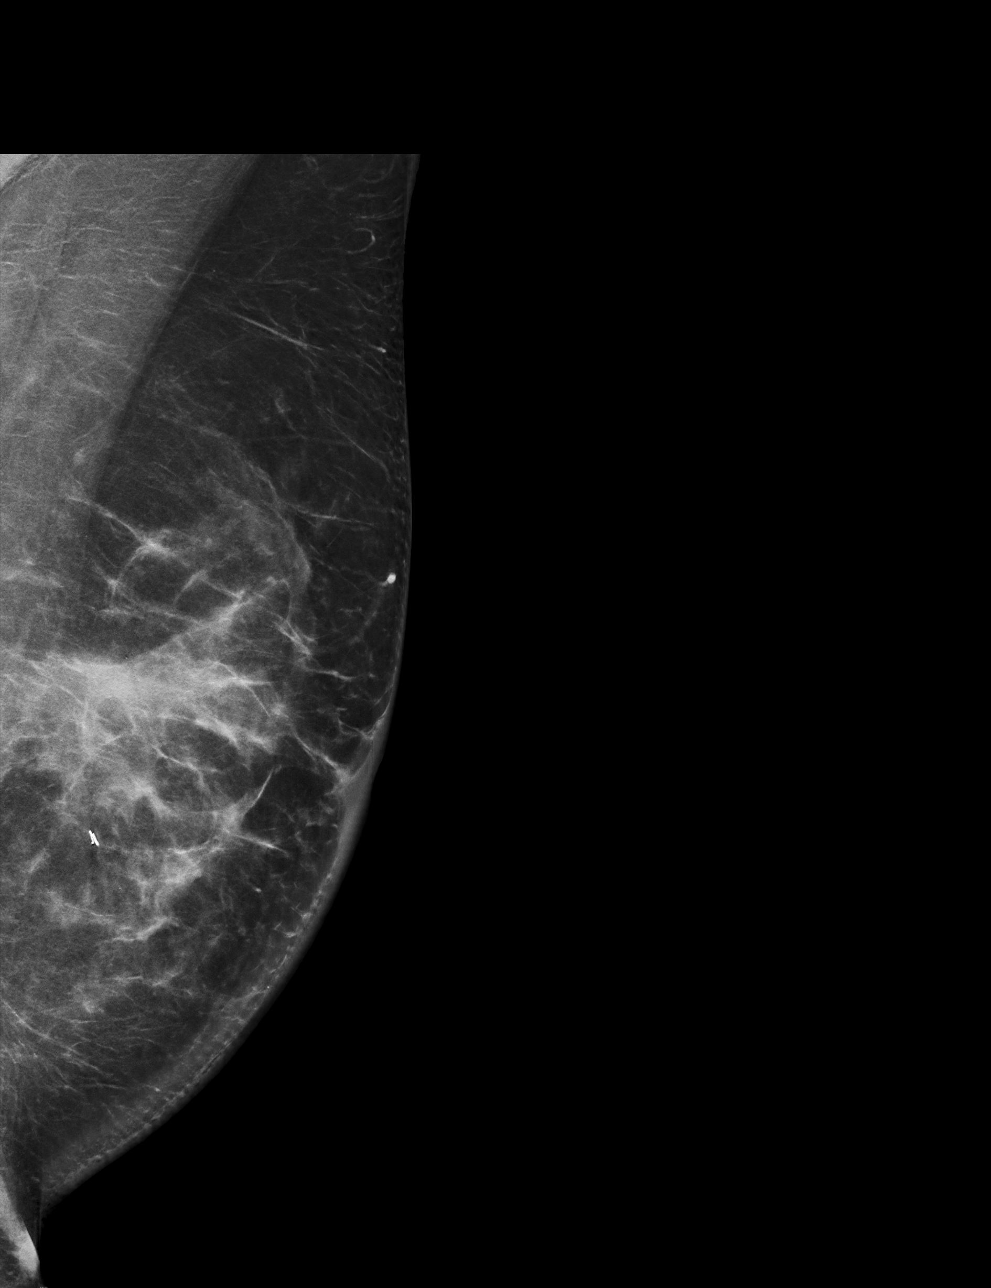

[L CC tomo · tomo slice 41/82.0]
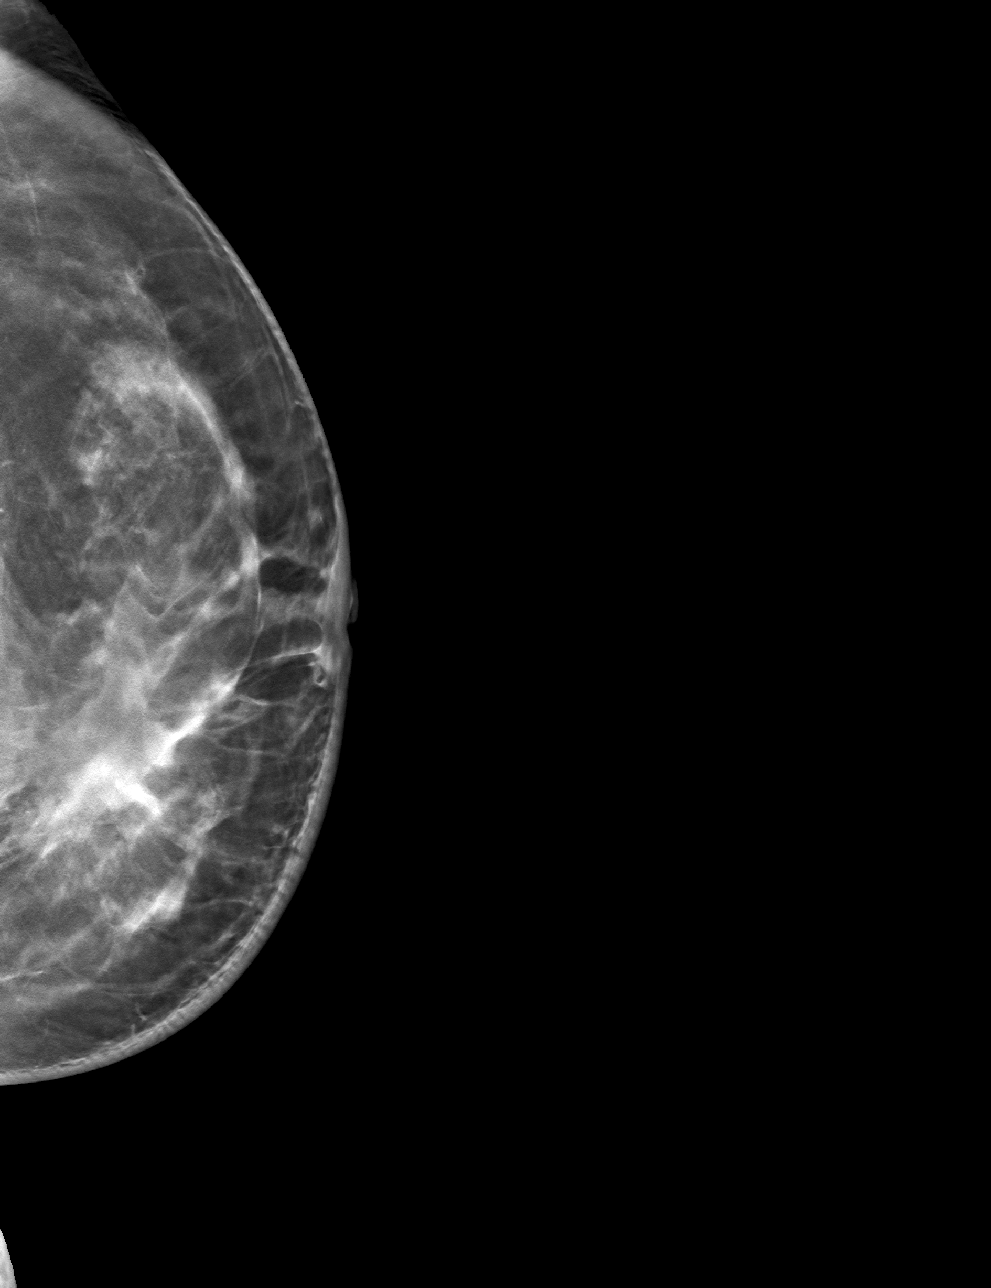

[L MLO tomo · tomo slice 43/84.0]
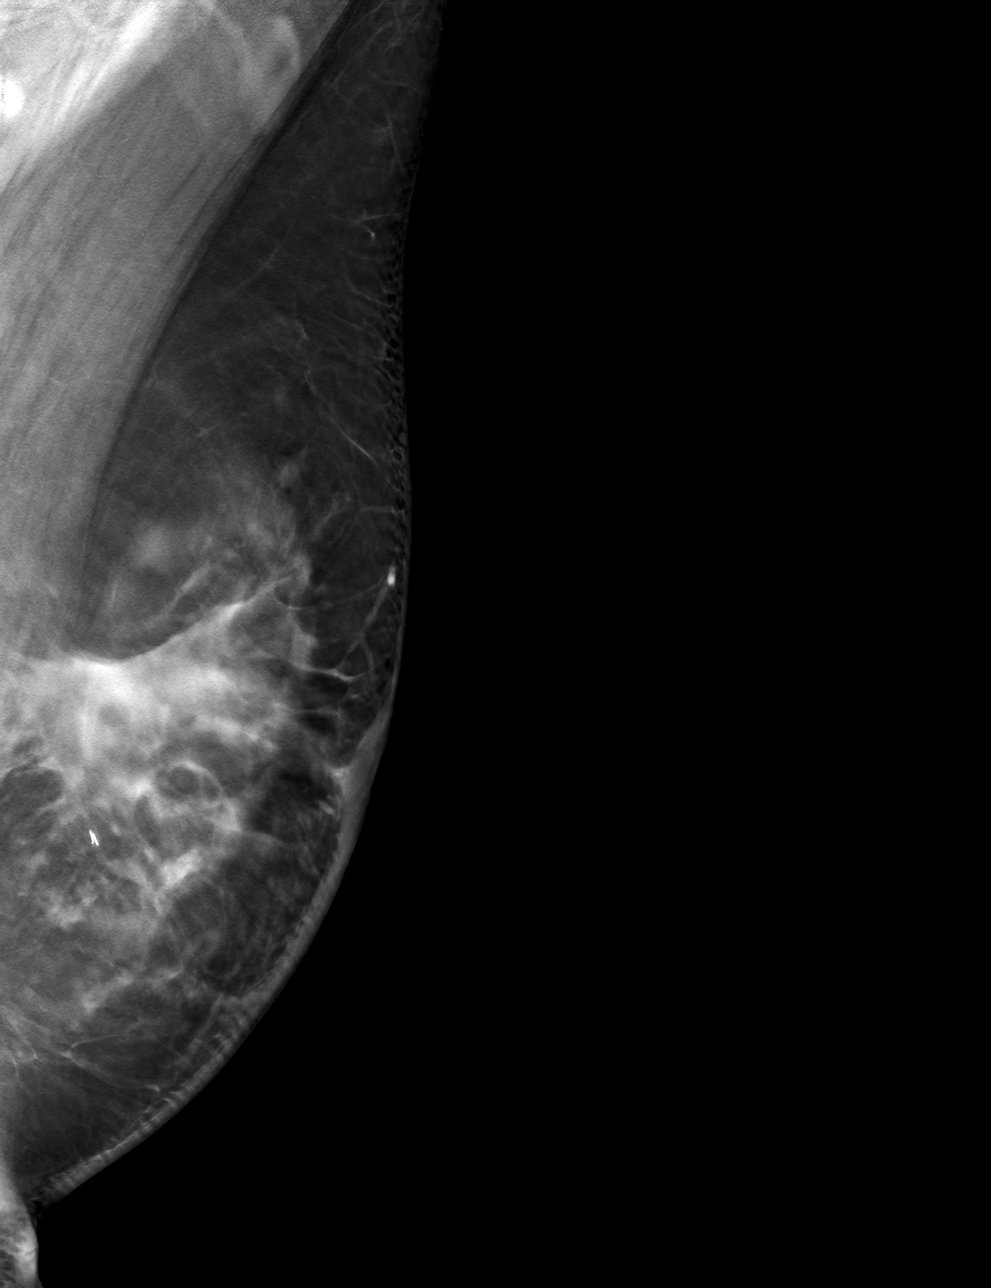

[4 of 12 positions shown; findings below may reference images not displayed]

Also, chest CTs including most recent of 03/19/2021.

ACR Breast Density Category c: The breast tissue is heterogeneously
dense, which may obscure small masses.
FINDINGS: The biopsy-proven malignancy within the LEFT breast appears
decreased in size/extent compared to the outside diagnostic
mammogram of 08/09/2020. Associated biopsy clip in place. Overlying
skin thickening has decreased as well.

There are no new dominant masses, suspicious calcifications or
secondary signs of malignancy identified within the LEFT breast on
today's exam.

Targeted ultrasound is performed, evaluating the far outer LEFT
breast and the soft tissues of the adjacent LEFT flank as directed
by the patient, showing only normal soft tissues. No solid or cystic
mass.
IMPRESSION: 1. Known biopsy-proven LEFT breast cancer. Breast MRI report of
08/16/2020 described the LEFT breast malignancy as involving all 4
quadrants and involving the skin of the nipple-areolar complex.
2. The size and extent of the biopsy-proven LEFT breast cancer
appears decreased in size/extent compared to the outside diagnostic
mammogram of 08/09/2020. The overlying skin thickening has decreased
as well. Findings suggest a positive response to interval treatment.
3. No evidence of malignancy or acute findings within the far outer
LEFT breast and adjacent LEFT flank, corresponding to the area of
patient's clinical concern today.

RECOMMENDATION:
1. Per treatment plan for patient's known LEFT breast cancer.
2. Consider follow-up breast MRI for more accurate assessment of
patient's response to interval treatment.
3. Patient was also recommended for MRI-guided biopsy of 2
suspicious lesions in the RIGHT breast, as detailed on breast MRI
report of 08/16/2020. If a mastectomy for the RIGHT breast is not
planned, would recommend the MRI-guided biopsies to exclude
contralateral disease.
4. Possible causes of her breast/flank pain were discussed with the
patient, including patient's history of LEFT rib metastasis and
thoracic spine metastasis which were demonstrated on previous CTs.

I have discussed the findings and recommendations with the patient.
If applicable, a reminder letter will be sent to the patient
regarding the next appointment.

BI-RADS CATEGORY  6: Known biopsy-proven malignancy.
